# Patient Record
Sex: Male | Born: 1965 | Race: White | Hispanic: No | Smoking: Never smoker
Health system: Southern US, Community
[De-identification: ages and names within clinical notes are randomized; demographics above are authoritative.]

## PROBLEM LIST (undated history)

## (undated) DIAGNOSIS — I2510 Atherosclerotic heart disease of native coronary artery without angina pectoris: Secondary | ICD-10-CM

## (undated) DIAGNOSIS — I739 Peripheral vascular disease, unspecified: Secondary | ICD-10-CM

## (undated) DIAGNOSIS — F32A Depression, unspecified: Secondary | ICD-10-CM

## (undated) DIAGNOSIS — L03116 Cellulitis of left lower limb: Secondary | ICD-10-CM

## (undated) DIAGNOSIS — G4730 Sleep apnea, unspecified: Secondary | ICD-10-CM

## (undated) DIAGNOSIS — I10 Essential (primary) hypertension: Secondary | ICD-10-CM

## (undated) DIAGNOSIS — S060X9A Concussion with loss of consciousness of unspecified duration, initial encounter: Secondary | ICD-10-CM

## (undated) DIAGNOSIS — L02612 Cutaneous abscess of left foot: Secondary | ICD-10-CM

## (undated) DIAGNOSIS — M797 Fibromyalgia: Secondary | ICD-10-CM

## (undated) DIAGNOSIS — T8781 Dehiscence of amputation stump: Secondary | ICD-10-CM

## (undated) DIAGNOSIS — I82409 Acute embolism and thrombosis of unspecified deep veins of unspecified lower extremity: Secondary | ICD-10-CM

## (undated) DIAGNOSIS — K219 Gastro-esophageal reflux disease without esophagitis: Secondary | ICD-10-CM

## (undated) DIAGNOSIS — I639 Cerebral infarction, unspecified: Secondary | ICD-10-CM

## (undated) DIAGNOSIS — F4310 Post-traumatic stress disorder, unspecified: Secondary | ICD-10-CM

## (undated) DIAGNOSIS — G629 Polyneuropathy, unspecified: Secondary | ICD-10-CM

## (undated) DIAGNOSIS — F419 Anxiety disorder, unspecified: Secondary | ICD-10-CM

## (undated) DIAGNOSIS — L0390 Cellulitis, unspecified: Secondary | ICD-10-CM

## (undated) DIAGNOSIS — E785 Hyperlipidemia, unspecified: Secondary | ICD-10-CM

## (undated) DIAGNOSIS — Z973 Presence of spectacles and contact lenses: Secondary | ICD-10-CM

## (undated) DIAGNOSIS — N184 Chronic kidney disease, stage 4 (severe): Secondary | ICD-10-CM

## (undated) DIAGNOSIS — S060XAA Concussion with loss of consciousness status unknown, initial encounter: Secondary | ICD-10-CM

## (undated) DIAGNOSIS — F40240 Claustrophobia: Secondary | ICD-10-CM

## (undated) DIAGNOSIS — F410 Panic disorder [episodic paroxysmal anxiety] without agoraphobia: Secondary | ICD-10-CM

## (undated) DIAGNOSIS — N179 Acute kidney failure, unspecified: Secondary | ICD-10-CM

## (undated) DIAGNOSIS — M1990 Unspecified osteoarthritis, unspecified site: Secondary | ICD-10-CM

## (undated) DIAGNOSIS — F329 Major depressive disorder, single episode, unspecified: Secondary | ICD-10-CM

## (undated) HISTORY — DX: Cutaneous abscess of left foot: L02.612

## (undated) HISTORY — DX: Chronic kidney disease, stage 4 (severe): N18.4

## (undated) HISTORY — PX: EYE SURGERY: SHX253

## (undated) HISTORY — PX: TONSILLECTOMY: SUR1361

## (undated) HISTORY — PX: KNEE ARTHROSCOPY: SUR90

## (undated) HISTORY — DX: Dehiscence of amputation stump: T87.81

## (undated) HISTORY — PX: COLONOSCOPY: SHX174

## (undated) HISTORY — DX: Cellulitis of left lower limb: L03.116

## (undated) HISTORY — PX: RETINAL CRYOABLATION: SHX2337

## (undated) HISTORY — DX: Sleep apnea, unspecified: G47.30

## (undated) HISTORY — DX: Hyperlipidemia, unspecified: E78.5

## (undated) HISTORY — PX: WISDOM TOOTH EXTRACTION: SHX21

---

## 1996-08-22 DIAGNOSIS — I10 Essential (primary) hypertension: Secondary | ICD-10-CM

## 1996-08-22 HISTORY — DX: Essential (primary) hypertension: I10

## 1997-12-19 ENCOUNTER — Other Ambulatory Visit: Admission: RE | Admit: 1997-12-19 | Discharge: 1997-12-19 | Payer: Self-pay | Admitting: Obstetrics and Gynecology

## 2000-11-08 ENCOUNTER — Encounter: Payer: Self-pay | Admitting: Family Medicine

## 2000-11-08 ENCOUNTER — Ambulatory Visit (HOSPITAL_COMMUNITY): Admission: RE | Admit: 2000-11-08 | Discharge: 2000-11-08 | Payer: Self-pay | Admitting: Family Medicine

## 2002-10-02 ENCOUNTER — Encounter: Admission: RE | Admit: 2002-10-02 | Discharge: 2002-12-31 | Payer: Self-pay | Admitting: Family Medicine

## 2007-08-23 HISTORY — PX: CARDIAC CATHETERIZATION: SHX172

## 2011-07-21 DIAGNOSIS — E782 Mixed hyperlipidemia: Secondary | ICD-10-CM | POA: Insufficient documentation

## 2012-01-21 DIAGNOSIS — N179 Acute kidney failure, unspecified: Secondary | ICD-10-CM

## 2012-01-21 HISTORY — DX: Acute kidney failure, unspecified: N17.9

## 2012-02-05 ENCOUNTER — Encounter (HOSPITAL_COMMUNITY): Payer: Self-pay | Admitting: Internal Medicine

## 2012-02-05 ENCOUNTER — Inpatient Hospital Stay (HOSPITAL_COMMUNITY): Payer: Self-pay

## 2012-02-05 ENCOUNTER — Inpatient Hospital Stay (HOSPITAL_COMMUNITY)
Admission: RE | Admit: 2012-02-05 | Discharge: 2012-02-09 | DRG: 683 | Disposition: A | Discharge status: Home / Self Care | Payer: MEDICAID | Source: Other Acute Inpatient Hospital | Attending: Internal Medicine | Admitting: Internal Medicine

## 2012-02-05 DIAGNOSIS — E1165 Type 2 diabetes mellitus with hyperglycemia: Secondary | ICD-10-CM | POA: Diagnosis present

## 2012-02-05 DIAGNOSIS — N179 Acute kidney failure, unspecified: Secondary | ICD-10-CM

## 2012-02-05 DIAGNOSIS — E669 Obesity, unspecified: Secondary | ICD-10-CM | POA: Diagnosis present

## 2012-02-05 DIAGNOSIS — E119 Type 2 diabetes mellitus without complications: Secondary | ICD-10-CM | POA: Diagnosis present

## 2012-02-05 DIAGNOSIS — N189 Chronic kidney disease, unspecified: Secondary | ICD-10-CM | POA: Diagnosis present

## 2012-02-05 DIAGNOSIS — L03119 Cellulitis of unspecified part of limb: Secondary | ICD-10-CM | POA: Diagnosis present

## 2012-02-05 DIAGNOSIS — I2510 Atherosclerotic heart disease of native coronary artery without angina pectoris: Secondary | ICD-10-CM | POA: Diagnosis present

## 2012-02-05 DIAGNOSIS — R0602 Shortness of breath: Secondary | ICD-10-CM

## 2012-02-05 DIAGNOSIS — E1142 Type 2 diabetes mellitus with diabetic polyneuropathy: Secondary | ICD-10-CM | POA: Diagnosis present

## 2012-02-05 DIAGNOSIS — I10 Essential (primary) hypertension: Secondary | ICD-10-CM | POA: Diagnosis present

## 2012-02-05 DIAGNOSIS — L02419 Cutaneous abscess of limb, unspecified: Secondary | ICD-10-CM | POA: Diagnosis present

## 2012-02-05 DIAGNOSIS — R112 Nausea with vomiting, unspecified: Secondary | ICD-10-CM | POA: Diagnosis present

## 2012-02-05 DIAGNOSIS — I129 Hypertensive chronic kidney disease with stage 1 through stage 4 chronic kidney disease, or unspecified chronic kidney disease: Secondary | ICD-10-CM | POA: Diagnosis present

## 2012-02-05 DIAGNOSIS — E875 Hyperkalemia: Secondary | ICD-10-CM | POA: Diagnosis present

## 2012-02-05 DIAGNOSIS — Z6841 Body Mass Index (BMI) 40.0 and over, adult: Secondary | ICD-10-CM

## 2012-02-05 HISTORY — DX: Anxiety disorder, unspecified: F41.9

## 2012-02-05 HISTORY — DX: Nausea with vomiting, unspecified: R11.2

## 2012-02-05 HISTORY — DX: Atherosclerotic heart disease of native coronary artery without angina pectoris: I25.10

## 2012-02-05 HISTORY — DX: Fibromyalgia: M79.7

## 2012-02-05 HISTORY — DX: Essential (primary) hypertension: I10

## 2012-02-05 HISTORY — DX: Type 2 diabetes mellitus with hyperglycemia: E11.65

## 2012-02-05 LAB — COMPREHENSIVE METABOLIC PANEL
ALT: 6 U/L (ref 0–53)
Albumin: 3.8 g/dL (ref 3.5–5.2)
Alkaline Phosphatase: 83 U/L (ref 39–117)
Chloride: 86 mEq/L — ABNORMAL LOW (ref 96–112)
Glucose, Bld: 144 mg/dL — ABNORMAL HIGH (ref 70–99)
Potassium: 4.3 mEq/L (ref 3.5–5.1)
Sodium: 132 mEq/L — ABNORMAL LOW (ref 135–145)
Total Bilirubin: 0.3 mg/dL (ref 0.3–1.2)
Total Protein: 8.3 g/dL (ref 6.0–8.3)

## 2012-02-05 LAB — DIFFERENTIAL
Eosinophils Relative: 1 % (ref 0–5)
Lymphocytes Relative: 12 % (ref 12–46)
Lymphs Abs: 1.6 10*3/uL (ref 0.7–4.0)
Monocytes Relative: 7 % (ref 3–12)

## 2012-02-05 LAB — CBC
Hemoglobin: 11.5 g/dL — ABNORMAL LOW (ref 13.0–17.0)
MCH: 30.2 pg (ref 26.0–34.0)
MCV: 90.8 fL (ref 78.0–100.0)
Platelets: 568 10*3/uL — ABNORMAL HIGH (ref 150–400)
RBC: 3.81 MIL/uL — ABNORMAL LOW (ref 4.22–5.81)
WBC: 13.2 10*3/uL — ABNORMAL HIGH (ref 4.0–10.5)

## 2012-02-05 LAB — PROTIME-INR: Prothrombin Time: 14.3 seconds (ref 11.6–15.2)

## 2012-02-05 LAB — RETICULOCYTES
RBC.: 3.81 MIL/uL — ABNORMAL LOW (ref 4.22–5.81)
Retic Ct Pct: 0.6 % (ref 0.4–3.1)

## 2012-02-05 LAB — CK: Total CK: 34 U/L (ref 7–232)

## 2012-02-05 MED ORDER — ACETAMINOPHEN 650 MG RE SUPP: 650.0000 mg | Freq: Four times a day (QID) | RECTAL | Status: DC | PRN

## 2012-02-05 MED ORDER — ONDANSETRON HCL 4 MG/2ML IJ SOLN
4.0000 mg | Freq: Four times a day (QID) | INTRAMUSCULAR | Status: DC | PRN
  Administered 2012-02-05 – 2012-02-07 (×5): 4 mg via INTRAVENOUS
  Filled 2012-02-05 (×5): qty 2

## 2012-02-05 MED ORDER — INSULIN ASPART 100 UNIT/ML ~~LOC~~ SOLN
0.0000 [IU] | Freq: Three times a day (TID) | SUBCUTANEOUS | Status: DC
  Administered 2012-02-06 – 2012-02-09 (×9): 2 [IU] via SUBCUTANEOUS

## 2012-02-05 MED ORDER — FENTANYL 75 MCG/HR TD PT72
75.0000 ug | MEDICATED_PATCH | TRANSDERMAL | Status: DC
  Administered 2012-02-08: 75 ug via TRANSDERMAL
  Filled 2012-02-05: qty 1

## 2012-02-05 MED ORDER — POLYETHYLENE GLYCOL 3350 17 G PO PACK
17.0000 g | PACK | Freq: Every day | ORAL | Status: DC | PRN
  Filled 2012-02-05: qty 1

## 2012-02-05 MED ORDER — ONDANSETRON HCL 4 MG PO TABS: 4.0000 mg | ORAL_TABLET | Freq: Four times a day (QID) | ORAL | Status: DC | PRN

## 2012-02-05 MED ORDER — SODIUM CHLORIDE 0.9 % IV BOLUS (SEPSIS)
1000.0000 mL | Freq: Once | INTRAVENOUS | Status: AC
  Administered 2012-02-05: 1000 mL via INTRAVENOUS

## 2012-02-05 MED ORDER — PANTOPRAZOLE SODIUM 40 MG PO TBEC
40.0000 mg | DELAYED_RELEASE_TABLET | Freq: Every day | ORAL | Status: DC
  Administered 2012-02-06 – 2012-02-09 (×4): 40 mg via ORAL
  Filled 2012-02-05 (×4): qty 1

## 2012-02-05 MED ORDER — ACETAMINOPHEN 325 MG PO TABS: 650.0000 mg | ORAL_TABLET | Freq: Four times a day (QID) | ORAL | Status: DC | PRN

## 2012-02-05 MED ORDER — MORPHINE SULFATE 2 MG/ML IJ SOLN: 2.0000 mg | INTRAMUSCULAR | Status: DC | PRN

## 2012-02-05 MED ORDER — SODIUM CHLORIDE 0.9 % IV SOLN
INTRAVENOUS | Status: DC
  Administered 2012-02-05 – 2012-02-08 (×6): via INTRAVENOUS

## 2012-02-05 MED ORDER — LORAZEPAM 2 MG/ML IJ SOLN
1.0000 mg | Freq: Four times a day (QID) | INTRAMUSCULAR | Status: DC | PRN
  Administered 2012-02-05 – 2012-02-09 (×5): 1 mg via INTRAVENOUS
  Filled 2012-02-05 (×5): qty 1

## 2012-02-05 MED ORDER — CLOPIDOGREL BISULFATE 75 MG PO TABS
75.0000 mg | ORAL_TABLET | Freq: Every day | ORAL | Status: DC
  Administered 2012-02-06 – 2012-02-09 (×4): 75 mg via ORAL
  Filled 2012-02-05 (×5): qty 1

## 2012-02-05 MED ORDER — SIMVASTATIN 40 MG PO TABS
40.0000 mg | ORAL_TABLET | Freq: Every day | ORAL | Status: DC
  Administered 2012-02-06 – 2012-02-08 (×3): 40 mg via ORAL
  Filled 2012-02-05 (×5): qty 1

## 2012-02-05 MED ORDER — ZOLPIDEM TARTRATE 5 MG PO TABS
5.0000 mg | ORAL_TABLET | Freq: Every evening | ORAL | Status: DC | PRN
  Administered 2012-02-05: 5 mg via ORAL
  Filled 2012-02-05: qty 1

## 2012-02-05 MED ORDER — HEPARIN SODIUM (PORCINE) 5000 UNIT/ML IJ SOLN
5000.0000 [IU] | Freq: Three times a day (TID) | INTRAMUSCULAR | Status: DC
  Administered 2012-02-05 – 2012-02-08 (×9): 5000 [IU] via SUBCUTANEOUS
  Filled 2012-02-05 (×14): qty 1

## 2012-02-05 MED ORDER — METOPROLOL TARTRATE 25 MG PO TABS
25.0000 mg | ORAL_TABLET | Freq: Two times a day (BID) | ORAL | Status: DC
  Filled 2012-02-05: qty 1

## 2012-02-05 MED ORDER — METOPROLOL TARTRATE 25 MG PO TABS
25.0000 mg | ORAL_TABLET | Freq: Two times a day (BID) | ORAL | Status: DC
  Administered 2012-02-05 – 2012-02-09 (×8): 25 mg via ORAL
  Filled 2012-02-05 (×9): qty 1

## 2012-02-05 NOTE — Consult Note (Signed)
Kristopher Simon 02/05/2012 Hawley Michel D Requesting Physician:  Dr. Aileen Fass  Reason for Consult:  Acute renal failure in setting of LLE cellulitis HPI: The patient is a 46 y.o. year-old WM w/ hx of DM2 (35yrs), HTN (15 yrs), obesity, CAD w stent and hx of cellulitis admitted at [facility] 5/30 w 2d hx of fever and 24 hr hx of painful, red L leg below the knee.  SCr was 1.8, w prior baseline SCr of 1.1-1.2 in May 2013.  Patient had taken 1 large NSAID dose for fever 1-2 d PTA, but no chronic NSAID's. He was on lisinopril which was continued from 5/30-6/3.  He was treated with IV Vanc 2 gm q 12 hr to start, and IV zosyn.  Creat improved to 1.10 on 6/1, then worsened daily up to 4.5 on 6/4 and 6.20 on 6/5.  IV Vanc stopped on 6/3 and ACEI stopped on 6/5. Treated with BActrim instead.  Trough Vanc levels were 17, then 29, then 24.  Creat peaked at 8.3 on 6/7 then started to improve down to 7.90 on 6/10.  IV lasix was started on 6/9 and creat rose back up steadily to 9.4 on 6/14 and has remained over 9.  Pt transferred today due to concerns about possible uremia.  Did not receive any IV contrast.    Pt reports nausea, worse than usual (sensitive stomach at baseline per pt).  No hx of kidney disease or difficulty voiding. No SOB, cough , CP or fevers. Leg is much better, redness and pain mostly gone.  Getting OOB.  Not eating much, drinking some.  Not vomiting.  Some jerking when falling asleep.    Past Medical History:  Past Medical History  Diagnosis Date  . Coronary artery disease   . Hypertension 1998  . Diabetes mellitus 1995    Past Surgical History:  Past Surgical History  Procedure Date  . Cardiac catheterization     Family History:  Family History  Problem Relation Age of Onset  . Hypertension Mother   . Dementia Father   . Alzheimer's disease Father    Social History:  reports that he has never smoked. He does not have any smokeless tobacco history on file. He reports that  he drinks about .6 ounces of alcohol per week. He reports that he does not use illicit drugs.  Allergies:  Allergies  Allergen Reactions  . Ibuprofen     Kidney issues    Home medications: Prior to Admission medications   Not on File    Inpatient medications:    . clopidogrel  75 mg Oral Q breakfast  . fentaNYL  75 mcg Transdermal Q72H  . heparin  5,000 Units Subcutaneous Q8H  . insulin aspart  0-9 Units Subcutaneous TID WC  . metoprolol tartrate  25 mg Oral BID  . pantoprazole  40 mg Oral Q1200  . simvastatin  40 mg Oral q1800  . sodium chloride  1,000 mL Intravenous Once    Review of Systems Gen:  Denies headache, fever, chills, sweats.  No weight loss. HEENT:  No visual change, sore throat, difficulty swallowing. Resp:  No difficulty breathing, DOE.  No cough or hemoptysis. Cardiac:  No chest pain, orthopnea, PND.  Denies edema. GI:   Denies abdominal pain.   As above. No constipation. GU:  Denies difficulty or change in voiding.  No change in urine color.     MS:  Denies joint pain or swelling.   Derm:  Denies skin rash or  itching.  No chronic skin conditions.  Neuro:   Denies focal weakness, memory problems, hx stroke or TIA.   Psych:  Denies symptoms of depression of anxiety.  No hallucination.    Labs: Pending  Xrays/Other Studies: X-ray Chest Pa And Lateral   02/05/2012  *RADIOLOGY REPORT*  Clinical Data: Shortness of breath  CHEST - 2 VIEW  Comparison: 01/30/2012  Findings: Right subclavian approach central line tip terminates over the superior SVC.  Heart size is normal.  The lungs are clear. No pleural effusion.  No acute osseous finding.  IMPRESSION: No acute cardiopulmonary process.  Original Report Authenticated By: Arline Asp, M.D.    Physical Exam:  Blood pressure 152/78, pulse 73, temperature 98.2 F (36.8 C), temperature source Oral, resp. rate 20, height 6\' 5"  (1.956 m), weight 164.3 kg (362 lb 3.5 oz), SpO2 100.00%.  Gen: alert, no distress,  calm Skin: no rash, cyanosis Neck: no JVD, no bruits or LAN Chest: clear bilat to bases Heart: regular, no rub or gallop Abdomen: soft, obese, no HSM, no ascites or masses Ext: no LE edema, minimal LLE erythema residual above the ankle Neuro: alert, Ox3, no focal deficit, no asterixis Heme/Lymph: no bruising or LAN  Impression/Plan 1. Acute renal failure - likely ATN due to ACEI, lowish BP's and Vanc toxicity.  He received ACEI for first week of hospital stay and had rather abrupt onset of AKI with rapid loss of renal function.  ACEI and IV Vanc were stopped.  He was getting better then, but worsened again after IV lasix started on 6/9. Patients with AKI need full volume to recover promptly and should only be diuresed for gross intravascular volume excess, i.e. pulm edema.  Needs lots of fluids, he is dry at this time.  Suspect he will recover, no need for acute HD.  He has mild uremic symptoms.  Stopped IV lasix.   2. Hyperkalemia- use low K diet, increase volume  3. LLE cellulitis - better, s/p abx. 4. DM 2 - 18 yrs duration, on insulin 5. CKD - baseline creat 1.1-1.2 6. HTN - do not overtreat, let BP run higher if needed while giving volume  Plan-  IVF's, stop lasix, daily SCr, strict I/O's, low K diet  [name]  MD [facility] [phone] pgr    [phone] cell 02/05/2012, 7:37 PM

## 2012-02-05 NOTE — H&P (Signed)
[facility] History and Physical  Rayburn Depasquale [id number] DOB: 11-30-1965 DOA: 02/05/2012   PCP: William Hamburger, MD   Chief Complaint: Acute renal failure  HPI:  This is a 46 year old male with past medical history of hypertension, diabetes type 2, coronary artery disease with a baseline creatinine of 1.0 back on May 30 at 1-2 more head hospital on May 30 for cellulitis of the lower extremity. Therefore going to noted he took 2 days worth of ibuprofen about 1200 mg. He was started on IV vancomycin and Zosyn was continue on his ACE inhibitor was also put on Bactrim and over the next several days his creatinine climbed to 7.1. [facility] over the phone for his care but his creatinine progressively got worse. She also relates some mild shortness of breath. He says he has been having good urine output. But because his creatinine is not improved he was transferred to [facility] his last creatinine from 1 head was 9.3. So he was transferred to [facility] for further evaluation. He underwent complaining of nausea which has progressively gotten worse.  Review of Systems:  Constitutional:  No weight loss, night sweats, Fevers, chills, fatigue.  HEENT:  No headaches, Difficulty swallowing,Tooth/dental problems,Sore throat,  No sneezing, itching, ear ache, nasal congestion, post nasal drip,  Cardio-vascular:  No chest pain, Orthopnea, PND, swelling in lower extremities, anasarca, dizziness, palpitations  GI:  No heartburn, indigestion, abdominal pain, nausea, vomiting, diarrhea, change in bowel habits, loss of appetite  Resp:  No shortness of breath with exertion or at rest. No excess mucus, no productive cough, No non-productive cough, No coughing up of blood.No change in color of mucus.No wheezing.No chest wall deformity  Skin:  no rash or lesions.  GU:  no dysuria, change in color of urine, no urgency or frequency. No flank pain.  Musculoskeletal:  No  joint pain or swelling. No decreased range of motion. No back pain.  Psych:  No change in mood or affect. No depression or anxiety. No memory loss.    Past Medical History  Diagnosis Date  . Coronary artery disease    Past Surgical History  Procedure Date  . Cardiac catheterization    Social History:  does not have a smoking history on file. He does not have any smokeless tobacco history on file. He reports that he drinks about .6 ounces of alcohol per week. He reports that he does not use illicit drugs.  Allergies  Allergen Reactions  . Ibuprofen     Kidney issues    Family History  Problem Relation Age of Onset  . Hypertension Mother   . Dementia Father   . Alzheimer's disease Father     Prior to Admission medications   Not on File   Physical Exam: Filed Vitals:   02/05/12 1650  BP: 152/78  Pulse: 73  Temp: 98.2 F (36.8 C)  TempSrc: Oral  Resp: 20  Height: 6\' 5"  (1.956 m)  SpO2: 100%   BP 152/78  Pulse 73  Temp 98.2 F (36.8 C) (Oral)  Resp 20  Ht 6\' 5"  (1.956 m)  SpO2 100%  General Appearance:    Alert, cooperative, no distress, appears stated age, obese male   Head:    Normocephalic, without obvious abnormality, atraumatic  Eyes:    PERRL, conjunctiva/corneas clear, EOM's intact, fundi    benign, both eyes       Ears:    Normal TM's and external ear canals, both ears  Nose:  Nares normal, septum midline, mucosa normal, no drainage    or sinus tenderness  Throat:   Lips, mucosa, and tongue normal; teeth and gums normal  Neck:   Supple, symmetrical, trachea midline, no adenopathy;       thyroid:  No enlargement/tenderness/nodules; no carotid   bruit or JVD  Back:     Symmetric, no curvature, ROM normal, no CVA tenderness  Lungs:     Clear to auscultation bilaterally, respirations unlabored  Chest wall:    No tenderness or deformity  Heart:    Regular rate and rhythm, S1 and S2 normal, no murmur, rub   or gallop  Abdomen:     Soft, non-tender, bowel  sounds active all four quadrants,    no masses, no organomegaly        Extremities:   Extremities normal, atraumatic, no cyanosis or edema  Pulses:   2+ and symmetric all extremities  Skin:   Skin color, texture, turgor normal, no rashes or lesions  Lymph nodes:   Cervical, supraclavicular, and axillary nodes normal  Neurologic:   CNII-XII intact. Normal strength, sensation and reflexes      throughout    Labs on Admission:  Basic Metabolic Panel: No results found for this basename: NA:5,K:2,CL:5,CO2:5,GLUCOSE:5,BUN:5,CREATININE:5,CALCIUM:5,MG:5,PHOS:5 in the last 168 hours Liver Function Tests: No results found for this basename: AST:5,ALT:5,ALKPHOS:5,BILITOT:5,PROT:5,ALBUMIN:5 in the last 168 hours No results found for this basename: LIPASE:5,AMYLASE:5 in the last 168 hours No results found for this basename: AMMONIA:5 in the last 168 hours CBC: No results found for this basename: WBC:5,NEUTROABS:5,HGB:5,HCT:5,MCV:5,PLT:5 in the last 168 hours Cardiac Enzymes: No results found for this basename: CKTOTAL:5,CKMB:5,CKMBINDEX:5,TROPONINI:5 in the last 168 hours BNP: No components found with this basename: POCBNP:5 CBG: No results found for this basename: GLUCAP:5 in the last 168 hours  Radiological Exams on Admission: No results found.  EKG: Independently reviewed. Pending  Assessment/Plan: Active Problems:  Acute kidney injury: -Most likely multifactorial to exposure to vancomycin, ACE inhibitor possibly with worsening creatinine and Bactrim. We'll check an HIV test, we'll get a renal ultrasound, will do strict I.'s and O's. We'll not start no diuretics. We'll consult renal. We'll check a basic metabolic panel, anemia panel, phosphorus level. We'll then check a TSH and a B12 level.  I will continue conservative management with IV fluids.  -Further workup per renal  We'll go and consult renal for further management.  Shortness of breath: He is sitting in bed comfortably  satting greater than 90% on room air with no difficulty breathing. He has no edema by physical exam. He relates his shortness of breath is mildly worse than it was comparing it to a month ago but not significantly. We'll get an EKG and a chest x-ray. At this time doesn't have crackles in his lungs, no edema and appreciated JVD.  Diabetes mellitus We'll use sliding-scale insulin. Check hemoglobin A1c to  HTN (hypertension): Currently well controlled we'll continue him on his metoprolol. And titrate medication as we continue to monitor his blood pressure.  Nausea & vomiting Nausea is now improved. This probably uremia. Going back to the records from her head. His BUN got up to over 100 which can be contributing to his nausea.  Time spend: Greater than 35 minutes Code Status: Full code Family Communication: Patient and family Disposition Plan: To be determined  Charlynne Cousins, MD  [facility] Pager [phone]  If 7PM-7AM, please contact night-coverage [email or web address] Password [email or web address] 02/05/2012, 5:40 PM

## 2012-02-06 ENCOUNTER — Encounter (HOSPITAL_COMMUNITY): Payer: Self-pay | Admitting: *Deleted

## 2012-02-06 ENCOUNTER — Inpatient Hospital Stay (HOSPITAL_COMMUNITY): Payer: Self-pay

## 2012-02-06 DIAGNOSIS — I10 Essential (primary) hypertension: Secondary | ICD-10-CM

## 2012-02-06 DIAGNOSIS — R0602 Shortness of breath: Secondary | ICD-10-CM

## 2012-02-06 DIAGNOSIS — N179 Acute kidney failure, unspecified: Secondary | ICD-10-CM

## 2012-02-06 DIAGNOSIS — E1165 Type 2 diabetes mellitus with hyperglycemia: Secondary | ICD-10-CM

## 2012-02-06 LAB — COMPREHENSIVE METABOLIC PANEL
Alkaline Phosphatase: 77 U/L (ref 39–117)
BUN: 95 mg/dL — ABNORMAL HIGH (ref 6–23)
Chloride: 90 mEq/L — ABNORMAL LOW (ref 96–112)
GFR calc Af Amer: 8 mL/min — ABNORMAL LOW (ref >90)
GFR calc non Af Amer: 7 mL/min — ABNORMAL LOW (ref >90)
Glucose, Bld: 113 mg/dL — ABNORMAL HIGH (ref 70–99)
Potassium: 4.3 mEq/L (ref 3.5–5.1)
Total Bilirubin: 0.3 mg/dL (ref 0.3–1.2)

## 2012-02-06 LAB — CBC
MCV: 91.3 fL (ref 78.0–100.0)
Platelets: 458 10*3/uL — ABNORMAL HIGH (ref 150–400)
RDW: 12.8 % (ref 11.5–15.5)
WBC: 9.7 10*3/uL (ref 4.0–10.5)

## 2012-02-06 LAB — TSH: TSH: 2.448 u[IU]/mL (ref 0.350–4.500)

## 2012-02-06 LAB — GLUCOSE, CAPILLARY
Glucose-Capillary: 116 mg/dL — ABNORMAL HIGH (ref 70–99)
Glucose-Capillary: 167 mg/dL — ABNORMAL HIGH (ref 70–99)
Glucose-Capillary: 161 mg/dL — ABNORMAL HIGH (ref 70–99)
Glucose-Capillary: 142 mg/dL — ABNORMAL HIGH (ref 70–99)

## 2012-02-06 LAB — IRON AND TIBC: TIBC: 295 ug/dL (ref 215–435)

## 2012-02-06 LAB — HIV ANTIBODY (ROUTINE TESTING W REFLEX): HIV: NONREACTIVE

## 2012-02-06 LAB — SODIUM, URINE, RANDOM: Sodium, Ur: 34 mEq/L

## 2012-02-06 LAB — HEMOGLOBIN A1C: Hgb A1c MFr Bld: 7.8 % — ABNORMAL HIGH (ref <5.7)

## 2012-02-06 LAB — FERRITIN: Ferritin: 340 ng/mL — ABNORMAL HIGH (ref 22–322)

## 2012-02-06 NOTE — Progress Notes (Signed)
Subjective: Still with some nausea. No abdominal pain. No diarrhea.  Want to rest and sleep.   Objective: Filed Vitals:   02/05/12 1650 02/05/12 2133 02/06/12 0530 02/06/12 0931  BP: 152/78 126/73 137/69 151/76  Pulse: 73 77 71 67  Temp: 98.2 F (36.8 C) 97.6 F (36.4 C) 97.8 F (36.6 C) 97.5 F (36.4 C)  TempSrc: Oral Oral Oral Oral  Resp: 20 20 20 20   Height: 6\' 5"  (1.956 m)     Weight: 164.3 kg (362 lb 3.5 oz)     SpO2: 100% 100% 99% 100%   Weight change:    General: Alert, awake, oriented x3, in no acute distress.  HEENT: No bruits, no goiter.  Heart: Regular rate and rhythm, without murmurs, rubs, gallops.  Lungs: CTA, bilateral air movement.  Abdomen: Soft, nontender, nondistended, positive bowel sounds.  Neuro: Grossly intact, nonfocal. Extremities; no edema.   Lab Results:  Basename 02/06/12 0600 02/05/12 2040  NA 135 132*  K 4.3 4.3  CL 90* 86*  CO2 29 28  GLUCOSE 113* 144*  BUN 95* 96*  CREATININE 8.15* 8.72*  CALCIUM 9.6 10.0  MG -- 2.2  PHOS -- 8.1*    Basename 02/06/12 0600 02/05/12 2040  AST 8 8  ALT 6 6  ALKPHOS 77 83  BILITOT 0.3 0.3  PROT 7.8 8.3  ALBUMIN 3.5 3.8   Basename 02/06/12 0600 02/05/12 2040  WBC 9.7 13.2*  NEUTROABS -- 10.5*  HGB 10.9* 11.5*  HCT 32.7* 34.6*  MCV 91.3 90.8  PLT 458* 568*    Basename 02/05/12 2040  CKTOTAL 34  CKMB --  CKMBINDEX --  TROPONINI --    Basename 02/05/12 2040  HGBA1C 7.8*   Basename 02/05/12 2040  TSH 2.448  T4TOTAL --  T3FREE --  THYROIDAB --    Basename 02/05/12 2040  VITAMINB12 284  FOLATE 8.0  FERRITIN 340*  TIBC 295  IRON 80  RETICCTPCT 0.6    Micro Results: Recent Results (from the past 240 hour(s))  MRSA PCR SCREENING     Status: Normal   Collection Time   02/05/12  8:57 PM      Component Value Range Status Comment   MRSA by PCR NEGATIVE  NEGATIVE Final     Studies/Results: X-ray Chest Pa And Lateral   02/05/2012  *RADIOLOGY REPORT*  Clinical Data:  Shortness of breath  CHEST - 2 VIEW  Comparison: 01/30/2012  Findings: Right subclavian approach central line tip terminates over the superior SVC.  Heart size is normal.  The lungs are clear. No pleural effusion.  No acute osseous finding.  IMPRESSION: No acute cardiopulmonary process.  Original Report Authenticated By: Arline Asp, M.D.   US Renal  02/06/2012  *RADIOLOGY REPORT*  Clinical Data: Acute renal failure.  RENAL/URINARY TRACT ULTRASOUND COMPLETE  Comparison:  Renal ultrasound 01/25/2012.  Findings:  Right Kidney:  Measures 13.0 cm and appears normal without stone, mass or hydronephrosis. Fatty infiltration of the liver noted.  Left Kidney:  Measures 14.6 cm and appears normal without stone, mass or hydronephrosis.  Bladder:  Unremarkable.  IMPRESSION: Negative for hydronephrosis or acute finding.  Fatty infiltration of the liver noted.  Both  Original Report Authenticated By: Arvid Right. Luther Parody, M.D.    Medications: I have reviewed the patient's current medications.  1- Acute kidney injury:  -Most likely multifactorial to exposure to vancomycin, ACE inhibitor possibly with worsening creatinine and Bactrim.  HIV test pending,  renal ultrasound negative for obstruction. Continue with  IV fluids. Appreciate renal help.   2-Shortness of breath:  Denies dyspnea. EKG sinus rhythm and  chest x-ray: No acute cardiopulmonary process.  3-Diabetes mellitus  We'll use sliding-scale insulin.  hemoglobin A1c 7.8.  4-HTN (hypertension):  Continue with metoprolol.   5-Nausea & vomiting  Nausea is now improved. This probably uremia. Continue with protonix.  6-Left LE Cellulitis: resolved.      LOS: 1 day   Kojo Liby M.D.  [facility] 02/06/2012, 12:53 PM

## 2012-02-06 NOTE — Evaluation (Signed)
Physical Therapy Evaluation Patient Details Name: Kristopher Simon MRN: [id number] DOB: [birth year] Today's Date: 02/06/2012 Time: [id number] PT Time Calculation (min): 10 min  PT Assessment / Plan / Recommendation Clinical Impression  Pt adm with acute kidney injury.  Pt I with all mobility and no PT needed.    PT Assessment  Patent does not need any further PT services    Follow Up Recommendations  No PT follow up    Barriers to Discharge        lEquipment Recommendations  None recommended by PT    Recommendations for Other Services     Frequency      Precautions / Restrictions     Pertinent Vitals/Pain N/A      Mobility  Bed Mobility Bed Mobility: Supine to Sit;Sitting - Scoot to Edge of Bed Supine to Sit: 7: Independent Sitting - Scoot to [employer] of Bed: 7: Independent Transfers Transfers: Sit to Stand;Stand to Sit Sit to Stand: 7: Independent Stand to Sit: 7: Independent Ambulation/Gait Ambulation/Gait Assistance: 7: Independent Ambulation Distance (Feet): 300 Feet Assistive device: None Gait Pattern: Within Functional Limits    Exercises     PT Diagnosis:    PT Problem List:   PT Treatment Interventions:     PT Goals    Visit Information  Last PT Received On: 02/06/12    Subjective Data  Subjective: Pt states he doesn't like to wear a hospital gown. Patient Stated Goal: Return home   Prior [place] Lives With: Alone Type of Home: House Home Access: Stairs to enter Home Layout: One level [place]: None Prior Function Level of Independence: Independent Able to Take Stairs?: Yes Driving: Yes Vocation: Self employed Communication Communication: No difficulties    Cognition  Overall Cognitive Status: Appears within functional limits for tasks assessed/performed Arousal/Alertness: Awake/alert Orientation Level: Appears intact for tasks assessed Behavior During Session: [facility] for tasks performed    Extremity/Trunk  Assessment Right Lower Extremity Assessment RLE ROM/Strength/Tone: Within functional levels Left Lower Extremity Assessment LLE ROM/Strength/Tone: Within functional levels   Balance    End of Session PT - End of Session Activity Tolerance: Patient tolerated treatment well Patient left:  (standing in room)   [place] 02/06/2012, 2:15 PM  [facility] PT [phone]

## 2012-02-06 NOTE — Progress Notes (Signed)
Subjective:  Good UOP, numbers down but just a little bit.  Alert, joking, not uremic Objective Vital signs in last 24 hours: Filed Vitals:   02/05/12 1650 02/05/12 2133 02/06/12 0530 02/06/12 0931  BP: 152/78 126/73 137/69 151/76  Pulse: 73 77 71 67  Temp: 98.2 F (36.8 C) 97.6 F (36.4 C) 97.8 F (36.6 C) 97.5 F (36.4 C)  TempSrc: Oral Oral Oral Oral  Resp: 20 20 20 20   Height: 6\' 5"  (1.956 m)     Weight: 164.3 kg (362 lb 3.5 oz)     SpO2: 100% 100% 99% 100%   Weight change:   Intake/Output Summary (Last 24 hours) at 02/06/12 1135 Last data filed at 02/06/12 0936  Gross per 24 hour  Intake 1753.27 ml  Output   1700 ml  Net  53.27 ml   Labs: Basic Metabolic Panel:  Lab 99991111 0600 02/05/12 2040  NA 135 132*  K 4.3 4.3  CL 90* 86*  CO2 29 28  GLUCOSE 113* 144*  BUN 95* 96*  CREATININE 8.15* 8.72*  CALCIUM 9.6 10.0  ALB -- --  PHOS -- 8.1*   Liver Function Tests:  Lab 02/06/12 0600 02/05/12 2040  AST 8 8  ALT 6 6  ALKPHOS 77 83  BILITOT 0.3 0.3  PROT 7.8 8.3  ALBUMIN 3.5 3.8   No results found for this basename: LIPASE:3,AMYLASE:3 in the last 168 hours No results found for this basename: AMMONIA:3 in the last 168 hours CBC:  Lab 02/06/12 0600 02/05/12 2040  WBC 9.7 13.2*  NEUTROABS -- 10.5*  HGB 10.9* 11.5*  HCT 32.7* 34.6*  MCV 91.3 90.8  PLT 458* 568*   Cardiac Enzymes:  Lab 02/05/12 2040  CKTOTAL 34  CKMB --  CKMBINDEX --  TROPONINI --   CBG:  Lab 02/06/12 0744 02/05/12 2008 02/05/12 1713  GLUCAP 116* 151* 132*    Iron Studies:  Basename 02/05/12 2040  IRON --  TIBC --  TRANSFERRIN --  FERRITIN 340*   Studies/Results: X-ray Chest Pa And Lateral   02/05/2012  *RADIOLOGY REPORT*  Clinical Data: Shortness of breath  CHEST - 2 VIEW  Comparison: 01/30/2012  Findings: Right subclavian approach central line tip terminates over the superior SVC.  Heart size is normal.  The lungs are clear. No pleural effusion.  No acute osseous  finding.  IMPRESSION: No acute cardiopulmonary process.  Original Report Authenticated By: Arline Asp, M.D.   US Renal  02/06/2012  *RADIOLOGY REPORT*  Clinical Data: Acute renal failure.  RENAL/URINARY TRACT ULTRASOUND COMPLETE  Comparison:  Renal ultrasound 01/25/2012.  Findings:  Right Kidney:  Measures 13.0 cm and appears normal without stone, mass or hydronephrosis. Fatty infiltration of the liver noted.  Left Kidney:  Measures 14.6 cm and appears normal without stone, mass or hydronephrosis.  Bladder:  Unremarkable.  IMPRESSION: Negative for hydronephrosis or acute finding.  Fatty infiltration of the liver noted.  Both  Original Report Authenticated By: Arvid Right. Luther Parody, M.D.   Medications: Infusions:    . sodium chloride 100 mL/hr at 02/06/12 1026    Scheduled Medications:    . clopidogrel  75 mg Oral Q breakfast  . fentaNYL  75 mcg Transdermal Q72H  . heparin  5,000 Units Subcutaneous Q8H  . insulin aspart  0-9 Units Subcutaneous TID WC  . metoprolol tartrate  25 mg Oral BID  . pantoprazole  40 mg Oral Q1200  . simvastatin  40 mg Oral q1800  . sodium chloride  1,000 mL Intravenous Once  . DISCONTD: metoprolol tartrate  25 mg Oral BID    have reviewed scheduled and prn medications.  Physical Exam: General: alert, joking Heart: RRR Lungs: clear bilaterally Abdomen: obese, soft Extremities: no edema   I Assessment/ Plan: Pt is a 46 y.o. yo male who was admitted on 02/05/2012 with  Cellulitis and acute on chronic renal failure.  Assessment/Plan: 1. Acute renal failure - likely ATN due to ACEI, lowish BP's and Vanc toxicity, as well as historical NSAIDS and bactrim. Appeared to be dry at the time of admit, has been getting IVF. Mild improvement and good UOP, no indications for HD.  continue with IVF for now. Check labs in AM 2. Hyperkalemia- use low K diet, increase volume...better 3. LLE cellulitis - better, s/p abx. 4. DM 2 - 18 yrs duration, on insulin 5. CKD  - baseline creat 1.1-1.2 6. HTN - do not overtreat, let BP run higher if needed while giving volume 7. Dispo- Creatinine does not need to be at baseline, just trending toward to be able to discharge     Fabiana Dromgoole A   02/06/2012,11:35 AM  LOS: 1 day

## 2012-02-06 NOTE — Progress Notes (Signed)
Utilization review completed. Lizabeth Leyden RN, CCM

## 2012-02-07 DIAGNOSIS — N179 Acute kidney failure, unspecified: Secondary | ICD-10-CM

## 2012-02-07 DIAGNOSIS — E1165 Type 2 diabetes mellitus with hyperglycemia: Secondary | ICD-10-CM

## 2012-02-07 DIAGNOSIS — I10 Essential (primary) hypertension: Secondary | ICD-10-CM

## 2012-02-07 DIAGNOSIS — R0602 Shortness of breath: Secondary | ICD-10-CM

## 2012-02-07 LAB — CBC
Hemoglobin: 10.3 g/dL — ABNORMAL LOW (ref 13.0–17.0)
MCH: 29.8 pg (ref 26.0–34.0)
Platelets: 484 10*3/uL — ABNORMAL HIGH (ref 150–400)
RBC: 3.46 MIL/uL — ABNORMAL LOW (ref 4.22–5.81)

## 2012-02-07 LAB — RENAL FUNCTION PANEL
Albumin: 3.4 g/dL — ABNORMAL LOW (ref 3.5–5.2)
CO2: 27 mEq/L (ref 19–32)
Chloride: 98 mEq/L (ref 96–112)
GFR calc Af Amer: 11 mL/min — ABNORMAL LOW (ref >90)
GFR calc non Af Amer: 10 mL/min — ABNORMAL LOW (ref >90)
Potassium: 4.1 mEq/L (ref 3.5–5.1)
Sodium: 140 mEq/L (ref 135–145)

## 2012-02-07 LAB — GLUCOSE, CAPILLARY
Glucose-Capillary: 173 mg/dL — ABNORMAL HIGH (ref 70–99)
Glucose-Capillary: 187 mg/dL — ABNORMAL HIGH (ref 70–99)

## 2012-02-07 MED ORDER — SODIUM CHLORIDE 0.9 % IJ SOLN
10.0000 mL | INTRAMUSCULAR | Status: DC | PRN
  Administered 2012-02-07 – 2012-02-08 (×2): 10 mL
  Administered 2012-02-09: 30 mL

## 2012-02-07 MED ORDER — METOCLOPRAMIDE HCL 5 MG PO TABS
5.0000 mg | ORAL_TABLET | Freq: Three times a day (TID) | ORAL | Status: DC
  Administered 2012-02-07 – 2012-02-09 (×7): 5 mg via ORAL
  Filled 2012-02-07 (×12): qty 1

## 2012-02-07 NOTE — Progress Notes (Signed)
Subjective: Patient relates vomiting last night, fluid content , no blood. Denies abdominal pain.   Objective: Filed Vitals:   02/06/12 1900 02/06/12 2043 02/07/12 0531 02/07/12 1000  BP:  167/74 154/92 136/70  Pulse:  69 75 77  Temp:  97.8 F (36.6 C) 98.1 F (36.7 C) 98.3 F (36.8 C)  TempSrc:  Oral Oral Oral  Resp:  19 20 20   Height:      Weight: 162.4 kg (358 lb 0.4 oz)     SpO2:  100% 96% 98%   Weight change: -1.9 kg (-4 lb 3 oz)   General: Alert, awake, oriented x3, in no acute distress.  HEENT: No bruits, no goiter.  Heart: Regular rate and rhythm, without murmurs, rubs, gallops.  Lungs: CTA, bilateral air movement.  Abdomen: Soft, nontender, nondistended, positive bowel sounds.  Neuro: Grossly intact, nonfocal. Extremities; no edema.  Lab Results:  Basename 02/07/12 0610 02/06/12 0600 02/05/12 2040  NA 140 135 --  K 4.1 4.3 --  CL 98 90* --  CO2 27 29 --  GLUCOSE 169* 113* --  BUN 79* 95* --  CREATININE 6.31* 8.15* --  CALCIUM 9.6 9.6 --  MG -- -- 2.2  PHOS 5.7* -- 8.1*    Basename 02/07/12 0610 02/06/12 0600 02/05/12 2040  AST -- 8 8  ALT -- 6 6  ALKPHOS -- 77 83  BILITOT -- 0.3 0.3  PROT -- 7.8 8.3  ALBUMIN 3.4* 3.5 --    Basename 02/07/12 0610 02/06/12 0600 02/05/12 2040  WBC 8.1 9.7 --  NEUTROABS -- -- 10.5*  HGB 10.3* 10.9* --  HCT 31.9* 32.7* --  MCV 92.2 91.3 --  PLT 484* 458* --    Basename 02/05/12 2040  CKTOTAL 34  CKMB --  CKMBINDEX --  TROPONINI --    Basename 02/05/12 2040  HGBA1C 7.8*    Basename 02/05/12 2040  TSH 2.448  T4TOTAL --  T3FREE --  THYROIDAB --    Basename 02/05/12 2040  VITAMINB12 284  FOLATE 8.0  FERRITIN 340*  TIBC 295  IRON 80  RETICCTPCT 0.6    Micro Results: Recent Results (from the past 240 hour(s))  MRSA PCR SCREENING     Status: Normal   Collection Time   02/05/12  8:57 PM      Component Value Range Status Comment   MRSA by PCR NEGATIVE  NEGATIVE Final      Studies/Results: X-ray Chest Pa And Lateral   02/05/2012  *RADIOLOGY REPORT*  Clinical Data: Shortness of breath  CHEST - 2 VIEW  Comparison: 01/30/2012  Findings: Right subclavian approach central line tip terminates over the superior SVC.  Heart size is normal.  The lungs are clear. No pleural effusion.  No acute osseous finding.  IMPRESSION: No acute cardiopulmonary process.  Original Report Authenticated By: Arline Asp, M.D.   US Renal  02/06/2012  *RADIOLOGY REPORT*  Clinical Data: Acute renal failure.  RENAL/URINARY TRACT ULTRASOUND COMPLETE  Comparison:  Renal ultrasound 01/25/2012.  Findings:  Right Kidney:  Measures 13.0 cm and appears normal without stone, mass or hydronephrosis. Fatty infiltration of the liver noted.  Left Kidney:  Measures 14.6 cm and appears normal without stone, mass or hydronephrosis.  Bladder:  Unremarkable.  IMPRESSION: Negative for hydronephrosis or acute finding.  Fatty infiltration of the liver noted.  Both  Original Report Authenticated By: Arvid Right. Luther Parody, M.D.    Medications: I have reviewed the patient's current medications.  46 year old transfer from [place]  for management of acute renal failure. Patient was hospitalized for Lower extremities cellulitis. His renal failure is thought to be multifactorial, hemodynamic and medications (Vancomycin, ACE).   1- Acute kidney injury:  -Most likely multifactorial to exposure to vancomycin, ACE inhibitor possibly with worsening creatinine and Bactrim. HIV non reactive, renal ultrasound negative for obstruction. Continue with IV fluids. Appreciate renal help. Creatinine trending down. Good urine out put.   2-Shortness of breath:  Denies dyspnea. EKG sinus rhythm and chest x-ray: No acute cardiopulmonary process.   3-Diabetes mellitus  We'll use sliding-scale insulin. hemoglobin A1c 7.8.   4-HTN (hypertension):  Continue with metoprolol. Follow renal recommendation regarding BP control.    5-Nausea & vomiting  Nausea is now improved. This probably uremia. Continue with protonix. If no improvement will need further work up. LFT normal. Will start Reglan.  6-Left LE Cellulitis: resolved. Finished treatment at [place].      LOS: 2 days   Rock Sobol M.D.  [facility] 02/07/2012, 12:34 PM

## 2012-02-07 NOTE — Progress Notes (Signed)
Subjective:  Great UOP, numbers down much more.   Alert, joking, not uremic, hoping for home soon Objective Vital signs in last 24 hours: Filed Vitals:   02/06/12 1900 02/06/12 2043 02/07/12 0531 02/07/12 1000  BP:  167/74 154/92 136/70  Pulse:  69 75 77  Temp:  97.8 F (36.6 C) 98.1 F (36.7 C) 98.3 F (36.8 C)  TempSrc:  Oral Oral Oral  Resp:  19 20 20   Height:      Weight: 162.4 kg (358 lb 0.4 oz)     SpO2:  100% 96% 98%   Weight change: -1.9 kg (-4 lb 3 oz)  Intake/Output Summary (Last 24 hours) at 02/07/12 1059 Last data filed at 02/07/12 0900  Gross per 24 hour  Intake   4008 ml  Output   4350 ml  Net   -342 ml   Labs: Basic Metabolic Panel:  Lab [id number] 02/06/12 0600 02/05/12 2040  NA 140 135 132*  K 4.1 4.3 4.3  CL 98 90* 86*  CO2 27 29 28   GLUCOSE 169* 113* 144*  BUN 79* 95* 96*  CREATININE 6.31* 8.15* 8.72*  CALCIUM 9.6 9.6 10.0  ALB -- -- --  PHOS 5.7* -- 8.1*   Liver Function Tests:  Lab 02/07/12 0610 02/06/12 0600 02/05/12 2040  AST -- 8 8  ALT -- 6 6  ALKPHOS -- 77 83  BILITOT -- 0.3 0.3  PROT -- 7.8 8.3  ALBUMIN 3.4* 3.5 3.8   No results found for this basename: LIPASE:3,AMYLASE:3 in the last 168 hours No results found for this basename: AMMONIA:3 in the last 168 hours CBC:  Lab 02/07/12 0610 02/06/12 0600 02/05/12 2040  WBC 8.1 9.7 13.2*  NEUTROABS -- -- 10.5*  HGB 10.3* 10.9* 11.5*  HCT 31.9* 32.7* 34.6*  MCV 92.2 91.3 90.8  PLT 484* 458* 568*   Cardiac Enzymes:  Lab 02/05/12 2040  CKTOTAL 34  CKMB --  CKMBINDEX --  TROPONINI --   CBG:  Lab 02/07/12 0736 02/06/12 2041 02/06/12 1656 02/06/12 1130 02/06/12 0744  GLUCAP 163* 142* 161* 167* 116*    Iron Studies:   Basename 02/05/12 2040  IRON 80  TIBC 295  TRANSFERRIN --  FERRITIN 340*   Studies/Results: X-ray Chest Pa And Lateral   02/05/2012  *RADIOLOGY REPORT*  Clinical Data: Shortness of breath  CHEST - 2 VIEW  Comparison: 01/30/2012  Findings: Right  subclavian approach central line tip terminates over the superior SVC.  Heart size is normal.  The lungs are clear. No pleural effusion.  No acute osseous finding.  IMPRESSION: No acute cardiopulmonary process.  Original Report Authenticated By: Arline Asp, M.D.   US Renal  02/06/2012  *RADIOLOGY REPORT*  Clinical Data: Acute renal failure.  RENAL/URINARY TRACT ULTRASOUND COMPLETE  Comparison:  Renal ultrasound 01/25/2012.  Findings:  Right Kidney:  Measures 13.0 cm and appears normal without stone, mass or hydronephrosis. Fatty infiltration of the liver noted.  Left Kidney:  Measures 14.6 cm and appears normal without stone, mass or hydronephrosis.  Bladder:  Unremarkable.  IMPRESSION: Negative for hydronephrosis or acute finding.  Fatty infiltration of the liver noted.  Both  Original Report Authenticated By: Arvid Right. Luther Parody, M.D.   Medications: Infusions:    . sodium chloride 100 mL/hr at 02/06/12 1959    Scheduled Medications:    . clopidogrel  75 mg Oral Q breakfast  . fentaNYL  75 mcg Transdermal Q72H  . heparin  5,000 Units Subcutaneous Q8H  .  insulin aspart  0-9 Units Subcutaneous TID WC  . metoprolol tartrate  25 mg Oral BID  . pantoprazole  40 mg Oral Q1200  . simvastatin  40 mg Oral q1800    have reviewed scheduled and prn medications.  Physical Exam: General: alert, joking Heart: RRR Lungs: clear bilaterally Abdomen: obese, soft Extremities: no edema   I Assessment/ Plan: Pt is a 46 y.o. yo male who was admitted on 02/05/2012 with  Cellulitis and acute on chronic renal failure.  Assessment/Plan: 1. Acute renal failure - likely ATN due to ACEI, lowish BP's and Vanc toxicity, as well as historical NSAIDS and bactrim. Appeared to be dry at the time of admit, has been getting IVF. Great UOP and improvement in BUN and creatinine. He probably is having a post injury autodiuresis and needs the IVF to keep up right now and not get too dry.  He is taking in POs as  well.  Will need to make sure can keep up with output on own prior to being discharged.  2. Hyperkalemia- resolved 3. LLE cellulitis - better, s/p abx. 4. DM 2 - 18 yrs duration, on insulin 5. CKD - baseline creat 1.1-1.2 6. HTN - do not overtreat, let BP run higher if needed while giving volume 7. Dispo- Creatinine does not need to be at baseline, just trending toward to be able to discharge, but needs to keep up with urinary losses as above. Continue fairly high dose IVF for now.       Jkwon Treptow A   02/07/2012,10:59 AM  LOS: 2 days

## 2012-02-08 DIAGNOSIS — R112 Nausea with vomiting, unspecified: Secondary | ICD-10-CM

## 2012-02-08 DIAGNOSIS — I10 Essential (primary) hypertension: Secondary | ICD-10-CM

## 2012-02-08 DIAGNOSIS — E1165 Type 2 diabetes mellitus with hyperglycemia: Secondary | ICD-10-CM

## 2012-02-08 LAB — RENAL FUNCTION PANEL
Albumin: 3.4 g/dL — ABNORMAL LOW (ref 3.5–5.2)
BUN: 56 mg/dL — ABNORMAL HIGH (ref 6–23)
CO2: 25 mEq/L (ref 19–32)
Calcium: 9.7 mg/dL (ref 8.4–10.5)
Chloride: 103 mEq/L (ref 96–112)
Creatinine, Ser: 4.51 mg/dL — ABNORMAL HIGH (ref 0.50–1.35)
GFR calc non Af Amer: 14 mL/min — ABNORMAL LOW (ref >90)

## 2012-02-08 LAB — GLUCOSE, CAPILLARY
Glucose-Capillary: 164 mg/dL — ABNORMAL HIGH (ref 70–99)
Glucose-Capillary: 167 mg/dL — ABNORMAL HIGH (ref 70–99)
Glucose-Capillary: 184 mg/dL — ABNORMAL HIGH (ref 70–99)

## 2012-02-08 NOTE — Progress Notes (Signed)
Subjective:  Great UOP, numbers down much more.   Alert, joking, not uremic, hoping for home soon Objective Vital signs in last 24 hours: Filed Vitals:   02/07/12 1738 02/07/12 2217 02/08/12 0520 02/08/12 0915  BP: 156/72 146/71 161/87 176/88  Pulse: 57 69 75 70  Temp: 98.2 F (36.8 C) 97.9 F (36.6 C) 97.7 F (36.5 C) 97.8 F (36.6 C)  TempSrc: Oral Oral Oral Oral  Resp: 20 19 18 18   Height:      Weight:  162.433 kg (358 lb 1.6 oz)    SpO2: 98% 99% 100% 98%   Weight change: 0.033 kg (1.2 oz)  Intake/Output Summary (Last 24 hours) at 02/08/12 1101 Last data filed at 02/08/12 0625  Gross per 24 hour  Intake 2921.67 ml  Output   3650 ml  Net -728.33 ml   Labs: Basic Metabolic Panel:  Lab 0000000 0500 02/07/12 0610 02/06/12 0600 02/05/12 2040  NA 139 140 135 --  K 4.4 4.1 4.3 --  CL 103 98 90* --  CO2 25 27 29  --  GLUCOSE 157* 169* 113* --  BUN 56* 79* 95* --  CREATININE 4.51* 6.31* 8.15* --  CALCIUM 9.7 9.6 9.6 --  ALB -- -- -- --  PHOS 4.6 5.7* -- 8.1*   Liver Function Tests:  Lab 02/08/12 0500 02/07/12 0610 02/06/12 0600 02/05/12 2040  AST -- -- 8 8  ALT -- -- 6 6  ALKPHOS -- -- 77 83  BILITOT -- -- 0.3 0.3  PROT -- -- 7.8 8.3  ALBUMIN 3.4* 3.4* 3.5 --   No results found for this basename: LIPASE:3,AMYLASE:3 in the last 168 hours No results found for this basename: AMMONIA:3 in the last 168 hours CBC:  Lab 02/07/12 0610 02/06/12 0600 02/05/12 2040  WBC 8.1 9.7 13.2*  NEUTROABS -- -- 10.5*  HGB 10.3* 10.9* 11.5*  HCT 31.9* 32.7* 34.6*  MCV 92.2 91.3 90.8  PLT 484* 458* 568*   Cardiac Enzymes:  Lab 02/05/12 2040  CKTOTAL 34  CKMB --  CKMBINDEX --  TROPONINI --   CBG:  Lab 02/08/12 0806 02/07/12 2211 02/07/12 1644 02/07/12 1129 02/07/12 0736  GLUCAP 164* 200* 187* 173* 163*    Iron Studies:   Basename 02/05/12 2040  IRON 80  TIBC 295  TRANSFERRIN --  FERRITIN 340*   Studies/Results: No results found. Medications: Infusions:     . sodium chloride 100 mL/hr at 02/08/12 0156    Scheduled Medications:    . clopidogrel  75 mg Oral Q breakfast  . fentaNYL  75 mcg Transdermal Q72H  . heparin  5,000 Units Subcutaneous Q8H  . insulin aspart  0-9 Units Subcutaneous TID WC  . metoCLOPramide  5 mg Oral TID AC & HS  . metoprolol tartrate  25 mg Oral BID  . pantoprazole  40 mg Oral Q1200  . simvastatin  40 mg Oral q1800    have reviewed scheduled and prn medications.  Physical Exam: General: alert, no distress Heart: RRR Lungs: clear bilaterally Abdomen: obese, soft Extremities: no edema   I Assessment/ Plan: Pt is Simon 46 y.o. yo male who was admitted on 02/05/2012 with  Cellulitis and acute on chronic renal failure.  Assessment/Plan: 1. Acute renal failure - likely ATN due to ACEI, lowish BP's and Vanc toxicity, as well as historical NSAIDS and bactrim. Appeared to be dry at the time of admit, has been getting IVF. Great UOP and improvement in BUN and creatinine. He probably is having  Simon post injury diuresis.  He is taking in POs as well.  I am going to stop IVFs today and advised patient to keep up his fluid intake.  If creatinine is  improved tomorrow I am OK with discharge in the AM.  He should be discharged on no diuretic.  2. Hyperkalemia- resolved 3. LLE cellulitis - better, s/p abx. 4. DM 2 - 18 yrs duration, on insulin 5. CKD - baseline creat 1.1-1.2 6. HTN - do not overtreat, let BP run higher if needed while giving volume 7. Dispo- As above, will d/c IVF.  If can keep up with losses with PO intake only and continue with renal function improvement can be discharged tomorrow    Kristopher Simon   02/08/2012,11:01 AM  LOS: 3 days

## 2012-02-08 NOTE — Progress Notes (Signed)
PCP: [facility], [place], [place]  Brief HPI:  This is a 46 year old male with past medical history of hypertension, diabetes type 2, coronary artery disease with a baseline creatinine of 1.0 back on May 30. Was admitted at [facility] for cellulitis on May 30 for cellulitis of the left lower extremity. He was started on IV vancomycin and Zosyn, was continued on his ACE inhibitor, was also put on Bactrim and over the next several days his creatinine climbed to 7.1. But because his creatinine was not improving he was transferred to [facility] for further evaluation.   Past medical history:  Past Medical History  Diagnosis Date  . Coronary artery disease   . Hypertension 1998  . Diabetes mellitus 1995  . Fibromyalgia   . Anxiety     Consultants: Nephrology  Procedures: Right subclavian placed at [facility]  Subjective: Patient with difficulty sleeping overnight. But otherwise feels well. Still with some nausea but improved. No pain. Left leg is better.  Objective: Vital signs in last 24 hours: Temp:  [97.7 F (36.5 C)-98.3 F (36.8 C)] 97.8 F (36.6 C) (06/19 0915) Pulse Rate:  [57-77] 70  (06/19 0915) Resp:  [18-20] 18  (06/19 0915) BP: (136-176)/(70-88) 176/88 mmHg (06/19 0915) SpO2:  [98 %-100 %] 98 % (06/19 0915) Weight:  [162.433 kg (358 lb 1.6 oz)] 162.433 kg (358 lb 1.6 oz) (06/18 2217) Weight change: 0.033 kg (1.2 oz) Last BM Date: 02/07/12  Intake/Output from previous day: 06/18 0701 - 06/19 0700 In: 3401.7 [P.O.:960; I.V.:2441.7] Out: 4350 [Urine:4350] Intake/Output this shift:    General appearance: alert, cooperative, appears stated age, no distress and moderately obese Resp: clear to auscultation bilaterally Cardio: regular rate and rhythm, S1, S2 normal, no murmur, click, rub or gallop GI: soft, non-tender; bowel sounds normal; no masses,  no organomegaly Extremities: extremities normal, atraumatic, no cyanosis or edema Pulses: 2+ and  symmetric Skin: mild erythema over left lower leg, much improved per patient Neurologic: Alert and oriented X 3, normal strength and tone. Normal symmetric reflexes. Normal coordination and gait  Lab Results:  Basename 02/07/12 0610 02/06/12 0600  WBC 8.1 9.7  HGB 10.3* 10.9*  HCT 31.9* 32.7*  PLT 484* 458*   BMET  Basename 02/08/12 0500 02/07/12 0610 02/06/12 0600 02/05/12 2040  NA 139 140 -- --  K 4.4 4.1 -- --  CL 103 98 -- --  CO2 25 27 -- --  GLUCOSE 157* 169* -- --  BUN 56* 79* -- --  CREATININE 4.51* 6.31* -- --  CALCIUM 9.7 9.6 -- --  ALT -- -- 6 6    Studies/Results: No results found.  Medications:  Scheduled:    . clopidogrel  75 mg Oral Q breakfast  . fentaNYL  75 mcg Transdermal Q72H  . heparin  5,000 Units Subcutaneous Q8H  . insulin aspart  0-9 Units Subcutaneous TID WC  . metoCLOPramide  5 mg Oral TID AC & HS  . metoprolol tartrate  25 mg Oral BID  . pantoprazole  40 mg Oral Q1200  . simvastatin  40 mg Oral q1800   Continuous:    . sodium chloride 100 mL/hr at 02/08/12 0156   [id number], acetaminophen, LORazepam, morphine injection, ondansetron (ZOFRAN) IV, ondansetron, polyethylene glycol, sodium chloride, zolpidem  Assessment/Plan:  Principal Problem:  *Acute kidney injury Active Problems:  Diabetes mellitus  HTN (hypertension)  Nausea & vomiting  Shortness of breath    Acute kidney injury  Most likely multifactorial to exposure to  vancomycin, ACE inhibitor and Bactrim. HIV non reactive, renal ultrasound negative for obstruction. Continue with IV fluids. Appreciate renal help. Creatinine trending down. Good urine output, likely post injury diuresis.   Diabetes mellitus  Continue sliding-scale insulin. hemoglobin A1c 7.8. Was on long acting insulin at home.  HTN (hypertension) Continue with metoprolol. Follow renal recommendation regarding BP control.   Nausea & vomiting  Vomiting has resolved. Nausea is improved. This was  probably secondary to uremia. Continue with protonix. Gastroparesis could also be present.  Shortness of breath Resolved. EKG sinus rhythm and chest x-ray: no acute cardiopulmonary process.   Left LE Cellulitis Resolved. Finished treatment at [place].   Code Status Full Code  DVT Prophylaxis Heparin SQ  Disposition Home once cleared by nephrology.   LOS: 3 days   [facility] Pager [phone] 02/08/2012, 9:29 AM

## 2012-02-09 DIAGNOSIS — N179 Acute kidney failure, unspecified: Secondary | ICD-10-CM

## 2012-02-09 DIAGNOSIS — I10 Essential (primary) hypertension: Secondary | ICD-10-CM

## 2012-02-09 DIAGNOSIS — E1165 Type 2 diabetes mellitus with hyperglycemia: Secondary | ICD-10-CM

## 2012-02-09 LAB — RENAL FUNCTION PANEL
Calcium: 9.9 mg/dL (ref 8.4–10.5)
GFR calc Af Amer: 23 mL/min — ABNORMAL LOW (ref >90)
GFR calc non Af Amer: 20 mL/min — ABNORMAL LOW (ref >90)
Glucose, Bld: 138 mg/dL — ABNORMAL HIGH (ref 70–99)
Phosphorus: 4.1 mg/dL (ref 2.3–4.6)
Potassium: 4.1 mEq/L (ref 3.5–5.1)
Sodium: 139 mEq/L (ref 135–145)

## 2012-02-09 LAB — GLUCOSE, CAPILLARY: Glucose-Capillary: 168 mg/dL — ABNORMAL HIGH (ref 70–99)

## 2012-02-09 MED ORDER — METOCLOPRAMIDE HCL 5 MG PO TABS: 5.0000 mg | ORAL_TABLET | Freq: Three times a day (TID) | ORAL | Status: DC

## 2012-02-09 MED ORDER — INSULIN DETEMIR 100 UNIT/ML ~~LOC~~ SOLN: 45.0000 [IU] | Freq: Two times a day (BID) | SUBCUTANEOUS | Status: DC

## 2012-02-09 MED ORDER — AMLODIPINE BESYLATE 10 MG PO TABS
10.0000 mg | ORAL_TABLET | Freq: Every day | ORAL | Status: DC
  Administered 2012-02-09: 10 mg via ORAL
  Filled 2012-02-09 (×2): qty 1

## 2012-02-09 MED ORDER — INSULIN DETEMIR 100 UNIT/ML ~~LOC~~ SOLN: 10.0000 [IU] | Freq: Every day | SUBCUTANEOUS | Status: DC

## 2012-02-09 MED ORDER — HYDRALAZINE HCL 20 MG/ML IJ SOLN
10.0000 mg | Freq: Once | INTRAMUSCULAR | Status: AC
  Administered 2012-02-09: 10 mg via INTRAVENOUS
  Filled 2012-02-09 (×2): qty 0.5

## 2012-02-09 NOTE — Progress Notes (Signed)
Subjective:  Creatinine down again, going home today.  Objective Vital signs in last 24 hours: Filed Vitals:   02/08/12 2104 02/09/12 0558 02/09/12 0941 02/09/12 0947  BP: 182/78 184/76 167/84 179/84  Pulse: 65 65  72  Temp: 98.6 F (37 C) 98.3 F (36.8 C)  98.4 F (36.9 C)  TempSrc: Oral Oral  Oral  Resp: 19 20  20   Height: 6\' 5"  (1.956 m)     Weight: 161.072 kg (355 lb 1.6 oz)     SpO2: 97% 99%  98%   Weight change: -1.361 kg (-3 lb)  Intake/Output Summary (Last 24 hours) at 02/09/12 1207 Last data filed at 02/09/12 0900  Gross per 24 hour  Intake    600 ml  Output   3851 ml  Net  -3251 ml   Labs: Basic Metabolic Panel:  Lab [id number] 0601 02/08/12 0500 02/07/12 0610  NA 139 139 140  K 4.1 4.4 4.1  CL 102 103 98  CO2 25 25 27   GLUCOSE 138* 157* 169*  BUN 41* 56* 79*  CREATININE 3.41* 4.51* 6.31*  CALCIUM 9.9 9.7 9.6  ALB -- -- --  PHOS 4.1 4.6 5.7*   Liver Function Tests:  Lab 02/09/12 0601 02/08/12 0500 02/07/12 0610 02/06/12 0600 02/05/12 2040  AST -- -- -- 8 8  ALT -- -- -- 6 6  ALKPHOS -- -- -- 77 83  BILITOT -- -- -- 0.3 0.3  PROT -- -- -- 7.8 8.3  ALBUMIN 3.5 3.4* 3.4* -- --   No results found for this basename: LIPASE:3,AMYLASE:3 in the last 168 hours No results found for this basename: AMMONIA:3 in the last 168 hours CBC:  Lab 02/07/12 0610 02/06/12 0600 02/05/12 2040  WBC 8.1 9.7 13.2*  NEUTROABS -- -- 10.5*  HGB 10.3* 10.9* 11.5*  HCT 31.9* 32.7* 34.6*  MCV 92.2 91.3 90.8  PLT 484* 458* 568*   Cardiac Enzymes:  Lab 02/05/12 2040  CKTOTAL 34  CKMB --  CKMBINDEX --  TROPONINI --   CBG:  Lab 02/09/12 1128 02/09/12 0805 02/08/12 2057 02/08/12 1701 02/08/12 1209  GLUCAP 168* 172* 132* 184* 167*    Iron Studies:  No results found for this basename: IRON,TIBC,TRANSFERRIN,FERRITIN in the last 72 hours Studies/Results: No results found. Medications: Infusions:    Scheduled Medications:    . amLODipine  10 mg Oral Daily  .  clopidogrel  75 mg Oral Q breakfast  . fentaNYL  75 mcg Transdermal Q72H  . heparin  5,000 Units Subcutaneous Q8H  . hydrALAZINE  10 mg Intravenous Once  . insulin aspart  0-9 Units Subcutaneous TID WC  . metoCLOPramide  5 mg Oral TID AC & HS  . metoprolol tartrate  25 mg Oral BID  . pantoprazole  40 mg Oral Q1200  . simvastatin  40 mg Oral q1800    have reviewed scheduled and prn medications.  Physical Exam: General: alert, no distress Heart: RRR Lungs: clear bilaterally Abdomen: obese, soft Extremities: no edema   I Assessment/ Plan: Pt is a 46 y.o. yo male who was admitted on 02/05/2012 with  Cellulitis and acute on chronic renal failure.  Assessment/Plan: 1. Acute renal failure - likely ATN due to ACEI, lowish BP's and Vanc toxicity, as well as historical NSAIDS and bactrim. Appeared to be dry at the time of admit, has been getting IVF. Great UOP and improvement in BUN and creatinine. He probably is having a post injury diuresis.  He is taking in POs  as well. He is encouraged to continue with drinking fluids as an OP.  He is not being discharged on a diuretic.  I anticipate that renal function will continue to improve.  He will follow up with PCP and only if kidney function does not improve we can see at [place] in follow up 2. Hyperkalemia- resolved 3. LLE cellulitis - better, s/p abx. 4. DM 2 - 18 yrs duration, on insulin 5. CKD - baseline creat 1.1-1.2 6. HTN - do not overtreat, let BP run higher if needed while giving volume Dispo- home today   Nhyira Leano A   02/09/2012,12:07 PM  LOS: 4 days

## 2012-02-09 NOTE — Discharge Summary (Signed)
Physician Discharge Summary  Patient ID: Kristopher Simon MRN: [id number] DOB/AGE: 1965-10-19 46 y.o.  Admit date: 02/05/2012 Discharge date: 02/09/2012  PCP: at [facility] in [place], [place]:  Principal Problem:  *Acute kidney injury Active Problems:  Diabetes mellitus  HTN (hypertension)  Nausea & vomiting  Shortness of breath   DISCHARGE CONDITION: fair  INITIAL HISTORY: This is a 46 year old male with past medical history of hypertension, diabetes type 2, coronary artery disease with a baseline creatinine of 1.0 back on May 30. Was admitted at [facility] for cellulitis on May 30 for cellulitis of the left lower extremity. He was started on IV vancomycin and Zosyn, was continued on his ACE inhibitor, was also put on Bactrim and over the next several days his creatinine climbed to 7.1. But because his creatinine was not improving he was transferred to [facility] for further evaluation.    HOSPITAL COURSE:   Acute kidney injury  After transfer from [place] patient was seen by nephrology here. They recommended that the patient be put on IV fluids. It was felt that he was clinically dehydrated. Lasix was discontinued. All nephrotoxic agents were stopped. Patient's renal function slowly started improving. He subsequently had post injury diuresis. Renal failure was thought to be multifactorial from exposure to vancomycin, ACE inhibitor and Bactrim. HIV non reactive, renal ultrasound was negative for obstruction. IV fluids were discontinued yesterday. Patient is able to maintain adequate input. His renal function is improving on a daily basis. So at this time he is considered stable for discharge. He will be asked to follow up with his primary care physician next week for repeat blood work. No ACE inhibitors for now.  Diabetes mellitus  Patient mentioned that he was taking Levemir at home. Since he had acute renal failure his Levemir was stopped. He was  just kept on sliding scale. His blood sugars have been reasonably well controlled, but in the last day or so it has been creeping up, but still less than 200. So I have explained to the patient that he should take a lower dose of Levemir at home. And, then increase the dose as his blood sugars go up. And, then he can followup with his doctor for further instruction. Hemoglobin A1c 7.8.    HTN (hypertension)  Blood pressure has been running high in the last few days. This is probably, because he's not on amlodipine, which he takes at home. This will be resumed today. He'll be given a dose of hydralazine. Once his blood pressure is better he can be discharged home. Continue with metoprolol.   Nausea & vomiting  Vomiting has resolved. Nausea is improved. This was probably secondary to uremia. Continue with protonix. Gastroparesis could also be present. So, the patient was started on Reglan, which will be continued.  Shortness of breath  Resolved. EKG sinus rhythm and chest x-ray: no acute cardiopulmonary process.   Left LE Cellulitis  This was the initial reason for admission to [facility] in [place]. Patient has completed course of antibiotics. His left leg shows mild erythema but is much better compared to his initial presentation. He's afebrile and his white cell count is normal.   Patient has significantly improved. He's very anxious to go home. Once his blood pressure is better he can be discharged.  He also had a right subclavian central line placed for poor venous access, which will be discontinued before he is discharged.  PERTINENT LABS: Sodium level was 132 at  admission. His chloride was 86. His BUN was 96 and creatinine was 8.72. Phosphorus was 8.1. His anemia panel was unremarkable. Total CK was normal. B12 was normal. TSH was 2.448. HbA1c was 7.8. HIV was nonreactive. MRSA, PCR was negative. His creatinine is improved to 3.41.   IMAGING STUDIES X-ray Chest Pa And Lateral    02/05/2012  *RADIOLOGY REPORT*  Clinical Data: Shortness of breath  CHEST - 2 VIEW  Comparison: 01/30/2012  Findings: Right subclavian approach central line tip terminates over the superior SVC.  Heart size is normal.  The lungs are clear. No pleural effusion.  No acute osseous finding.  IMPRESSION: No acute cardiopulmonary process.  Original Report Authenticated By: Arline Asp, M.D.   US Renal  02/06/2012  *RADIOLOGY REPORT*  Clinical Data: Acute renal failure.  RENAL/URINARY TRACT ULTRASOUND COMPLETE  Comparison:  Renal ultrasound 01/25/2012.  Findings:  Right Kidney:  Measures 13.0 cm and appears normal without stone, mass or hydronephrosis. Fatty infiltration of the liver noted.  Left Kidney:  Measures 14.6 cm and appears normal without stone, mass or hydronephrosis.  Bladder:  Unremarkable.  IMPRESSION: Negative for hydronephrosis or acute finding.  Fatty infiltration of the liver noted.  Both  Original Report Authenticated By: Arvid Right. D'ALESSIO, M.D.    DISCHARGE EXAMINATION: Blood pressure 184/76, pulse 65, temperature 98.3 F (36.8 C), temperature source Oral, resp. rate 20, height 6\' 5"  (1.956 m), weight 161.072 kg (355 lb 1.6 oz), SpO2 99.00%. General appearance: alert, cooperative, appears stated age and morbidly obese Resp: clear to auscultation bilaterally Cardio: regular rate and rhythm, S1, S2 normal, no murmur, click, rub or gallop GI: soft, non-tender; bowel sounds normal; no masses,  no organomegaly Extremities: Mild erythema over the left leg Neurologic: Grossly normal  DISPOSITION: Home  Discharge Orders    Future Orders Please Complete By Expires   Diet Carb Modified      Increase activity slowly      Discharge instructions      Comments:   Be sure to have your PCP check blood work (Basic Metabolic Panel) early next week to make sure your kidney function is improving.   Call MD for:      Comments:   Shortness of breath, leg swelling, fever, dizziness,  weakness     Current Discharge Medication List    START taking these medications   Details  metoCLOPramide (REGLAN) 5 MG tablet Take 1 tablet (5 mg total) by mouth 4 (four) times daily -  before meals and at bedtime. Qty: 120 tablet, Refills: 0      CONTINUE these medications which have CHANGED   Details  insulin detemir (LEVEMIR) 100 UNIT/ML injection Inject 10 Units into the skin at bedtime. Increase dose by 4 units every night for blood sugar greater than 200 in the morning Qty: 10 mL      CONTINUE these medications which have NOT CHANGED   Details  amLODipine (NORVASC) 10 MG tablet Take 10 mg by mouth daily.    clopidogrel (PLAVIX) 75 MG tablet Take 75 mg by mouth daily.    fentaNYL (DURAGESIC - DOSED MCG/HR) 50 MCG/HR Place 1 patch onto the skin every 3 (three) days.    metoprolol (LOPRESSOR) 50 MG tablet Take 50 mg by mouth 2 (two) times daily.    nystatin cream (MYCOSTATIN) Apply 1 application topically 3 (three) times daily.    pantoprazole (PROTONIX) 40 MG tablet Take 40 mg by mouth daily.    polyethylene glycol (MIRALAX / GLYCOLAX)  packet Take 17 g by mouth daily.    pravastatin (PRAVACHOL) 20 MG tablet Take 20 mg by mouth at bedtime.        Follow-up Information    Follow up with PCP at [facility] in [place]. Schedule an appointment as soon as possible for a visit in 5 days. (post hopsitalization follow up and blood work (Basic metabolic Panel))          TOTAL DISCHARGE TIME: 35 mins  [facility] Pager [phone]  02/09/2012, 8:37 AM

## 2012-02-09 NOTE — Progress Notes (Signed)
CVL  Was d/c,d/c instructions and appointments prescription were given to pt. Pt. Ready to go home.

## 2012-02-09 NOTE — Discharge Instructions (Signed)
Kidney Failure  In kidney failure, the kidneys lose their ability to filter enough waste products from the blood. They also lose the ability to regulate the body's balance of salt and water. Eventually, the kidneys slow their production of urine or stop producing it completely. Waste products and water gather in the body. This can lead to a life-threatening overload of fluids (such as heart failure). It can also lead to a dangerous buildup of waste products in the blood. These extreme changes in blood chemistry can affect the function of the heart and brain.   TYPES OF KIDNEY FAILURE  Acute kidney failure. In this form of kidney failure, the kidneys stop working properly because of a sudden illness, a medicine, or a medical condition that causes one of the following:    A severe drop in blood pressure or an interruption in the normal blood flow to the kidneys. This can occur during:   Major surgery.   Severe burns with fluid loss.   Massive bleeding.   A heart attack that severely affects heart function.   Blood clots that travel to the kidney.   Direct damage to kidney cells or to the kidneys' filtering units. This can be caused by:   An inflammation of the kidneys.   Toxic chemicals.   Medicines or infections.   Blocked urine flow from the kidney. This can occur because of obstructions outside the kidney, such as:   Kidney stones.   Bladder tumors.   An enlarged prostate.  Blockage of urine flow within the kidney can also cause sudden kidney failure, as can occur with large muscle injuries.   Chronic kidney failure. In this form of kidney failure, the kidney gradually loses function. This happens over a period of years. It is a slow and gradual loss of the ability of the kidneys to send out wastes, concentrate urine, and conserve the salts in your blood. Some of the causes of chronic kidney failure are:   Diabetes (very common cause).   Polycystic kidney disease.   Glomerulonephritis.   Alport  syndrome.   The flow of urine out of the kidney is blocked (obstructive uropathy).   High blood pressure (very common).   Long-term exposure to lead, mercury, and other chemicals and medicines.   Kidney stones with infection.   Reflux nephropathy.   Pain medicine overuse.  Some forms of chronic kidney failure run in families. Your caregiver will ask you about family medical problems.   End-stage kidney disease (ESKD). This is also called end-stage kidney failure. In ESKD, kidney function worsens until the person dies. This is usually the result of longstanding chronic kidney failure, but sometimes it follows acute kidney failure.  SYMPTOMS   Symptoms vary depending on the type of kidney failure.    Acute kidney failure. Symptoms include:   Swelling (edema) resulting from salt and water overload.   High blood pressure.   Vomiting.   Tiredness (lethargy) caused by the toxic effects of waste products on brain function.   Feeling sick to your stomach (nauseous).   Decreased urine output.   Chronic kidney failure and ERSD. Because the kidney damage in chronic kidney failure occurs slowly over a long time, symptoms develop slowly. Symptoms can include:   Headache.   Weakness.   Itching.   Vomiting.   Pale skin.   Slowing of growth in children.   Fatigue.   Tiredness (lethargy).   Poor appetite.   Increased thirst.   High blood pressure.     Bone damage in adults.  DIAGNOSIS   If you have an illness or medical condition that increases the risk of acute kidney failure, your caregivers will watch you closely. You may have blood and urine tests that measure the function of your kidneys. If you have a medical condition that increases the risk of long-term kidney damage, your caregiver will check your blood pressure and look for symptoms of chronic kidney failure during rechecks.  TREATMENT   Treatment depends on the type of kidney failure.    Acute kidney failure. Treatment begins with measures to  correct the cause of kidney failure (shock, hemorrhage, burns, heart attack). After this has begun, more specific kidney treatment may include:   Fluids given through the vein (intravenously) to correct any abnormal fluid loss.   Medicines called diuretics that increase urine output.   Limited fluids by mouth.   A diet low in protein and high in carbohydrates.   Medicines to adjust high or low levels of blood chemicals, such as potassium and medicines to control high blood pressure.   Short-term dialysis may be necessary if the patient develops severe high blood pressure, severe fluid overload, heart failure, symptoms of altered brain function, or severe abnormalities in blood chemistry.   Chronic kidney failure. People with chronic kidney failure are watched closely. They receive frequent physical exams, blood pressure checks, and blood testing. Treatment includes:   A low-protein and low-salt diet.   Medicines to adjust blood chemical levels.   Medicines to treat high blood pressure.   Sometimes, a hormonal medicine called erythropoietin is given to correct a low level of red blood cells (anemia).   ESKD. Treatment includes:   Dialysis until a donor can be found for a kidney transplant. Dialysis mechanically removes waste products from the blood.   Both kidneys may need to be removed surgically before a transplant in patients with severe high blood pressure or chronic pyelonephritis.  PROGNOSIS    Acute kidney failure may go away on its own. Some people recover within a matter of days. Exactly how long the illness lasts varies greatly from person-to-person. The duration depends on the cause of the kidney problem. In rare cases, acute kidney failure progresses to ESKD. Among people who recover, about 50% have some permanent kidney damage. In most cases, this is not severe enough to prevent you from living a normal life.   Chronic kidney failure is a lifelong problem that can worsen over time to  become ESKD. Not everyone develops ESKD. For those who do, the time it takes for ESKD to develop varies from person-to-person.   ESKD is a permanent condition that can be treated only with dialysis or a kidney transplant.  PREVENTION   Many forms of kidney failure cannot be prevented. People who have diabetes, high blood pressure, or coronary artery disease should try to control the illness with:   Appropriate diet.   Medicine.   Lifestyle changes.  If you have chronic kidney failure, you should tell all caregivers who treat you.   HOME CARE INSTRUCTIONS    Follow your diet and take your medicines as instructed.   Do not use any new medicines (prescription, over-the-counter, or nutritional supplements) unless approved by your caregiver. Many medicines can worsen your kidney damage or need to have the dose adjusted.   If dialysis is scheduled, keep all appointments. Call if you are unable to keep an appointment.  SEEK MEDICAL CARE IF:    You develop unexplained weakness, tiredness,   or appetite loss.   You feel poorly with no clear explanation.  SEEK IMMEDIATE MEDICAL CARE IF:    The amount of urine you produce either distinctly increases or decreases.   You develop swelling of the face and/or ankles.   You develop shortness of breath.  FOR MORE INFORMATION   National Institute of Diabetes and Digestive and Kidney Diseases: www2.niddk.nih.gov  National Kidney Foundation: www.kidney.org  Document Released: 08/08/2005 Document Revised: 07/28/2011 Document Reviewed: 12/09/2009  [employer] Patient Information 2012 [employer].

## 2012-02-27 ENCOUNTER — Other Ambulatory Visit: Payer: Self-pay | Admitting: Family Medicine

## 2012-08-22 DIAGNOSIS — I82409 Acute embolism and thrombosis of unspecified deep veins of unspecified lower extremity: Secondary | ICD-10-CM

## 2012-08-22 HISTORY — DX: Acute embolism and thrombosis of unspecified deep veins of unspecified lower extremity: I82.409

## 2012-08-22 HISTORY — PX: HIP FRACTURE SURGERY: SHX118

## 2013-04-25 DIAGNOSIS — S32409A Unspecified fracture of unspecified acetabulum, initial encounter for closed fracture: Secondary | ICD-10-CM | POA: Insufficient documentation

## 2013-04-25 DIAGNOSIS — Z7901 Long term (current) use of anticoagulants: Secondary | ICD-10-CM | POA: Insufficient documentation

## 2013-05-02 DIAGNOSIS — S32423A Displaced fracture of posterior wall of unspecified acetabulum, initial encounter for closed fracture: Secondary | ICD-10-CM | POA: Insufficient documentation

## 2013-07-24 DIAGNOSIS — R6889 Other general symptoms and signs: Secondary | ICD-10-CM

## 2013-07-24 DIAGNOSIS — IMO0001 Reserved for inherently not codable concepts without codable children: Secondary | ICD-10-CM | POA: Insufficient documentation

## 2014-03-04 DIAGNOSIS — E119 Type 2 diabetes mellitus without complications: Secondary | ICD-10-CM | POA: Insufficient documentation

## 2014-07-05 ENCOUNTER — Encounter (HOSPITAL_COMMUNITY): Payer: Self-pay | Admitting: Emergency Medicine

## 2014-07-05 DIAGNOSIS — K219 Gastro-esophageal reflux disease without esophagitis: Secondary | ICD-10-CM | POA: Diagnosis present

## 2014-07-05 DIAGNOSIS — L03116 Cellulitis of left lower limb: Principal | ICD-10-CM | POA: Diagnosis present

## 2014-07-05 DIAGNOSIS — Z794 Long term (current) use of insulin: Secondary | ICD-10-CM

## 2014-07-05 DIAGNOSIS — Z881 Allergy status to other antibiotic agents status: Secondary | ICD-10-CM

## 2014-07-05 DIAGNOSIS — E860 Dehydration: Secondary | ICD-10-CM | POA: Diagnosis present

## 2014-07-05 DIAGNOSIS — Z7982 Long term (current) use of aspirin: Secondary | ICD-10-CM

## 2014-07-05 DIAGNOSIS — N19 Unspecified kidney failure: Secondary | ICD-10-CM | POA: Diagnosis present

## 2014-07-05 DIAGNOSIS — F419 Anxiety disorder, unspecified: Secondary | ICD-10-CM | POA: Diagnosis present

## 2014-07-05 DIAGNOSIS — I2510 Atherosclerotic heart disease of native coronary artery without angina pectoris: Secondary | ICD-10-CM | POA: Diagnosis present

## 2014-07-05 DIAGNOSIS — I10 Essential (primary) hypertension: Secondary | ICD-10-CM | POA: Diagnosis present

## 2014-07-05 DIAGNOSIS — E871 Hypo-osmolality and hyponatremia: Secondary | ICD-10-CM | POA: Diagnosis present

## 2014-07-05 DIAGNOSIS — E1310 Other specified diabetes mellitus with ketoacidosis without coma: Secondary | ICD-10-CM | POA: Diagnosis present

## 2014-07-05 NOTE — ED Notes (Signed)
PT st's he woke up this am with redness in left lower leg and painful.  Pt st's he has had cellulitis before and this feels the same

## 2014-07-06 ENCOUNTER — Encounter (HOSPITAL_COMMUNITY): Payer: Self-pay | Admitting: Emergency Medicine

## 2014-07-06 ENCOUNTER — Inpatient Hospital Stay (HOSPITAL_COMMUNITY)
Admission: EM | Admit: 2014-07-06 | Discharge: 2014-07-11 | DRG: 602 | Disposition: A | Discharge status: Home / Self Care | Payer: Self-pay | Attending: Internal Medicine | Admitting: Internal Medicine

## 2014-07-06 DIAGNOSIS — L03116 Cellulitis of left lower limb: Secondary | ICD-10-CM

## 2014-07-06 DIAGNOSIS — E1310 Other specified diabetes mellitus with ketoacidosis without coma: Secondary | ICD-10-CM

## 2014-07-06 DIAGNOSIS — E1110 Type 2 diabetes mellitus with ketoacidosis without coma: Secondary | ICD-10-CM | POA: Diagnosis present

## 2014-07-06 DIAGNOSIS — K219 Gastro-esophageal reflux disease without esophagitis: Secondary | ICD-10-CM | POA: Diagnosis present

## 2014-07-06 DIAGNOSIS — I10 Essential (primary) hypertension: Secondary | ICD-10-CM | POA: Diagnosis present

## 2014-07-06 DIAGNOSIS — E871 Hypo-osmolality and hyponatremia: Secondary | ICD-10-CM | POA: Diagnosis present

## 2014-07-06 DIAGNOSIS — E0810 Diabetes mellitus due to underlying condition with ketoacidosis without coma: Secondary | ICD-10-CM

## 2014-07-06 DIAGNOSIS — R000 Tachycardia, unspecified: Secondary | ICD-10-CM | POA: Diagnosis present

## 2014-07-06 DIAGNOSIS — I2510 Atherosclerotic heart disease of native coronary artery without angina pectoris: Secondary | ICD-10-CM

## 2014-07-06 HISTORY — DX: Concussion with loss of consciousness of unspecified duration, initial encounter: S06.0X9A

## 2014-07-06 HISTORY — DX: Cellulitis of left lower limb: L03.116

## 2014-07-06 HISTORY — DX: Acute kidney failure, unspecified: N17.9

## 2014-07-06 HISTORY — DX: Cellulitis, unspecified: L03.90

## 2014-07-06 HISTORY — DX: Essential (primary) hypertension: I10

## 2014-07-06 HISTORY — DX: Concussion with loss of consciousness status unknown, initial encounter: S06.0XAA

## 2014-07-06 HISTORY — DX: Atherosclerotic heart disease of native coronary artery without angina pectoris: I25.10

## 2014-07-06 LAB — COMPREHENSIVE METABOLIC PANEL
ALBUMIN: 3.5 g/dL (ref 3.5–5.2)
ALT: 23 U/L (ref 0–53)
ANION GAP: 20 — AB (ref 5–15)
AST: 16 U/L (ref 0–37)
Alkaline Phosphatase: 73 U/L (ref 39–117)
BUN: 24 mg/dL — AB (ref 6–23)
CALCIUM: 9.5 mg/dL (ref 8.4–10.5)
CO2: 21 mEq/L (ref 19–32)
CREATININE: 1.27 mg/dL (ref 0.50–1.35)
Chloride: 84 mEq/L — ABNORMAL LOW (ref 96–112)
GFR calc Af Amer: 76 mL/min — ABNORMAL LOW (ref >90)
GFR calc non Af Amer: 65 mL/min — ABNORMAL LOW (ref >90)
Glucose, Bld: 529 mg/dL — ABNORMAL HIGH (ref 70–99)
Potassium: 4.2 mEq/L (ref 3.7–5.3)
Sodium: 125 mEq/L — ABNORMAL LOW (ref 137–147)
TOTAL PROTEIN: 8 g/dL (ref 6.0–8.3)
Total Bilirubin: 0.4 mg/dL (ref 0.3–1.2)

## 2014-07-06 LAB — CBC WITH DIFFERENTIAL/PLATELET
BASOS ABS: 0 10*3/uL (ref 0.0–0.1)
BASOS PCT: 0 % (ref 0–1)
EOS ABS: 0.1 10*3/uL (ref 0.0–0.7)
Eosinophils Relative: 1 % (ref 0–5)
HEMATOCRIT: 43.8 % (ref 39.0–52.0)
HEMOGLOBIN: 15.3 g/dL (ref 13.0–17.0)
Lymphocytes Relative: 13 % (ref 12–46)
Lymphs Abs: 1.4 10*3/uL (ref 0.7–4.0)
MCH: 31.9 pg (ref 26.0–34.0)
MCHC: 34.9 g/dL (ref 30.0–36.0)
MCV: 91.3 fL (ref 78.0–100.0)
MONO ABS: 1.2 10*3/uL — AB (ref 0.1–1.0)
MONOS PCT: 12 % (ref 3–12)
NEUTROS ABS: 7.8 10*3/uL — AB (ref 1.7–7.7)
Neutrophils Relative %: 74 % (ref 43–77)
Platelets: 245 10*3/uL (ref 150–400)
RBC: 4.8 MIL/uL (ref 4.22–5.81)
RDW: 13.1 % (ref 11.5–15.5)
WBC: 10.5 10*3/uL (ref 4.0–10.5)

## 2014-07-06 LAB — BASIC METABOLIC PANEL
ANION GAP: 21 — AB (ref 5–15)
ANION GAP: 16 — AB (ref 5–15)
ANION GAP: 14 (ref 5–15)
Anion gap: 16 — ABNORMAL HIGH (ref 5–15)
BUN: 21 mg/dL (ref 6–23)
BUN: 18 mg/dL (ref 6–23)
BUN: 18 mg/dL (ref 6–23)
BUN: 18 mg/dL (ref 6–23)
CALCIUM: 8.7 mg/dL (ref 8.4–10.5)
CALCIUM: 8.7 mg/dL (ref 8.4–10.5)
CHLORIDE: 95 meq/L — AB (ref 96–112)
CHLORIDE: 96 meq/L (ref 96–112)
CO2: 21 meq/L (ref 19–32)
CO2: 21 meq/L (ref 19–32)
CO2: 24 meq/L (ref 19–32)
CO2: 21 mEq/L (ref 19–32)
CREATININE: 1.15 mg/dL (ref 0.50–1.35)
CREATININE: 1.01 mg/dL (ref 0.50–1.35)
CREATININE: 1.01 mg/dL (ref 0.50–1.35)
Calcium: 8.8 mg/dL (ref 8.4–10.5)
Calcium: 8.9 mg/dL (ref 8.4–10.5)
Chloride: 90 mEq/L — ABNORMAL LOW (ref 96–112)
Chloride: 94 mEq/L — ABNORMAL LOW (ref 96–112)
Creatinine, Ser: 0.86 mg/dL (ref 0.50–1.35)
GFR calc Af Amer: 85 mL/min — ABNORMAL LOW (ref >90)
GFR calc Af Amer: >90 mL/min (ref >90)
GFR calc Af Amer: >90 mL/min (ref >90)
GFR calc non Af Amer: 74 mL/min — ABNORMAL LOW (ref >90)
GFR calc non Af Amer: 86 mL/min — ABNORMAL LOW (ref >90)
GFR calc non Af Amer: 86 mL/min — ABNORMAL LOW (ref >90)
GLUCOSE: 251 mg/dL — AB (ref 70–99)
Glucose, Bld: 342 mg/dL — ABNORMAL HIGH (ref 70–99)
Glucose, Bld: 209 mg/dL — ABNORMAL HIGH (ref 70–99)
Glucose, Bld: 227 mg/dL — ABNORMAL HIGH (ref 70–99)
POTASSIUM: 3.8 meq/L (ref 3.7–5.3)
Potassium: 4.2 mEq/L (ref 3.7–5.3)
Potassium: 3.9 mEq/L (ref 3.7–5.3)
Potassium: 4.3 mEq/L (ref 3.7–5.3)
SODIUM: 131 meq/L — AB (ref 137–147)
Sodium: 132 mEq/L — ABNORMAL LOW (ref 137–147)
Sodium: 132 mEq/L — ABNORMAL LOW (ref 137–147)
Sodium: 134 mEq/L — ABNORMAL LOW (ref 137–147)

## 2014-07-06 LAB — GLUCOSE, CAPILLARY
GLUCOSE-CAPILLARY: 213 mg/dL — AB (ref 70–99)
Glucose-Capillary: 220 mg/dL — ABNORMAL HIGH (ref 70–99)
Glucose-Capillary: 219 mg/dL — ABNORMAL HIGH (ref 70–99)
Glucose-Capillary: 218 mg/dL — ABNORMAL HIGH (ref 70–99)
Glucose-Capillary: 229 mg/dL — ABNORMAL HIGH (ref 70–99)
Glucose-Capillary: 238 mg/dL — ABNORMAL HIGH (ref 70–99)
Glucose-Capillary: 262 mg/dL — ABNORMAL HIGH (ref 70–99)
Glucose-Capillary: 234 mg/dL — ABNORMAL HIGH (ref 70–99)
Glucose-Capillary: 246 mg/dL — ABNORMAL HIGH (ref 70–99)

## 2014-07-06 LAB — CBG MONITORING, ED
GLUCOSE-CAPILLARY: 311 mg/dL — AB (ref 70–99)
GLUCOSE-CAPILLARY: 283 mg/dL — AB (ref 70–99)
GLUCOSE-CAPILLARY: 276 mg/dL — AB (ref 70–99)
GLUCOSE-CAPILLARY: 252 mg/dL — AB (ref 70–99)
Glucose-Capillary: 422 mg/dL — ABNORMAL HIGH (ref 70–99)
Glucose-Capillary: 401 mg/dL — ABNORMAL HIGH (ref 70–99)
Glucose-Capillary: 369 mg/dL — ABNORMAL HIGH (ref 70–99)
Glucose-Capillary: 343 mg/dL — ABNORMAL HIGH (ref 70–99)

## 2014-07-06 LAB — CBC
HEMATOCRIT: 39.9 % (ref 39.0–52.0)
Hemoglobin: 13.4 g/dL (ref 13.0–17.0)
MCH: 31.2 pg (ref 26.0–34.0)
MCHC: 33.6 g/dL (ref 30.0–36.0)
MCV: 92.8 fL (ref 78.0–100.0)
PLATELETS: 215 10*3/uL (ref 150–400)
RBC: 4.3 MIL/uL (ref 4.22–5.81)
RDW: 13.3 % (ref 11.5–15.5)
WBC: 8.7 10*3/uL (ref 4.0–10.5)

## 2014-07-06 LAB — MRSA PCR SCREENING: MRSA by PCR: NEGATIVE

## 2014-07-06 MED ORDER — SODIUM CHLORIDE 0.9 % IV SOLN
INTRAVENOUS | Status: DC
  Administered 2014-07-06: 8.5 [IU]/h via INTRAVENOUS
  Filled 2014-07-06 (×2): qty 2.5

## 2014-07-06 MED ORDER — AMLODIPINE BESYLATE 10 MG PO TABS
10.0000 mg | ORAL_TABLET | Freq: Every day | ORAL | Status: DC
  Administered 2014-07-06 – 2014-07-07 (×2): 10 mg via ORAL
  Filled 2014-07-06: qty 1
  Filled 2014-07-06: qty 2

## 2014-07-06 MED ORDER — CLINDAMYCIN PHOSPHATE 300 MG/50ML IV SOLN
300.0000 mg | Freq: Three times a day (TID) | INTRAVENOUS | Status: DC
  Administered 2014-07-06 – 2014-07-11 (×15): 300 mg via INTRAVENOUS
  Filled 2014-07-06 (×19): qty 50

## 2014-07-06 MED ORDER — PIPERACILLIN-TAZOBACTAM 3.375 G IVPB
3.3750 g | Freq: Three times a day (TID) | INTRAVENOUS | Status: DC
  Administered 2014-07-06 – 2014-07-11 (×15): 3.375 g via INTRAVENOUS
  Filled 2014-07-06 (×18): qty 50

## 2014-07-06 MED ORDER — FENTANYL 50 MCG/HR TD PT72
50.0000 ug | MEDICATED_PATCH | TRANSDERMAL | Status: DC
Start: 2014-07-06 — End: 2014-07-07
  Administered 2014-07-06: 50 ug via TRANSDERMAL
  Filled 2014-07-06: qty 2

## 2014-07-06 MED ORDER — MORPHINE SULFATE 2 MG/ML IJ SOLN
2.0000 mg | INTRAMUSCULAR | Status: DC | PRN
  Administered 2014-07-06 – 2014-07-07 (×4): 2 mg via INTRAVENOUS
  Filled 2014-07-06 (×4): qty 1

## 2014-07-06 MED ORDER — CLINDAMYCIN PHOSPHATE 900 MG/50ML IV SOLN
900.0000 mg | Freq: Once | INTRAVENOUS | Status: AC
  Administered 2014-07-06: 900 mg via INTRAVENOUS
  Filled 2014-07-06: qty 50

## 2014-07-06 MED ORDER — SODIUM CHLORIDE 0.9 % IV SOLN
INTRAVENOUS | Status: DC
  Administered 2014-07-06: 3.4 [IU]/h via INTRAVENOUS
  Administered 2014-07-06: 3.1 [IU]/h via INTRAVENOUS
  Filled 2014-07-06: qty 2.5

## 2014-07-06 MED ORDER — PRAVASTATIN SODIUM 20 MG PO TABS
20.0000 mg | ORAL_TABLET | Freq: Every day | ORAL | Status: DC
  Administered 2014-07-06 – 2014-07-10 (×5): 20 mg via ORAL
  Filled 2014-07-06 (×6): qty 1

## 2014-07-06 MED ORDER — DEXTROSE 50 % IV SOLN: 25.0000 mL | INTRAVENOUS | Status: DC | PRN

## 2014-07-06 MED ORDER — SODIUM CHLORIDE 0.9 % IV SOLN
INTRAVENOUS | Status: DC
  Administered 2014-07-06: 10:00:00 via INTRAVENOUS

## 2014-07-06 MED ORDER — TRAZODONE HCL 50 MG PO TABS
50.0000 mg | ORAL_TABLET | Freq: Once | ORAL | Status: AC
  Administered 2014-07-06: 50 mg via ORAL
  Filled 2014-07-06: qty 1

## 2014-07-06 MED ORDER — POTASSIUM CHLORIDE 10 MEQ/100ML IV SOLN
10.0000 meq | INTRAVENOUS | Status: AC
  Administered 2014-07-06: 10 meq via INTRAVENOUS
  Filled 2014-07-06: qty 100

## 2014-07-06 MED ORDER — CLOPIDOGREL BISULFATE 75 MG PO TABS
75.0000 mg | ORAL_TABLET | Freq: Every day | ORAL | Status: DC
  Administered 2014-07-06 – 2014-07-11 (×4): 75 mg via ORAL
  Filled 2014-07-06 (×6): qty 1

## 2014-07-06 MED ORDER — INSULIN DETEMIR 100 UNIT/ML ~~LOC~~ SOLN
45.0000 [IU] | Freq: Two times a day (BID) | SUBCUTANEOUS | Status: DC
  Filled 2014-07-06: qty 0.45

## 2014-07-06 MED ORDER — INSULIN ASPART 100 UNIT/ML ~~LOC~~ SOLN: 0.0000 [IU] | Freq: Every day | SUBCUTANEOUS | Status: DC

## 2014-07-06 MED ORDER — GABAPENTIN 100 MG PO CAPS
100.0000 mg | ORAL_CAPSULE | Freq: Three times a day (TID) | ORAL | Status: DC
  Administered 2014-07-06 – 2014-07-07 (×5): 100 mg via ORAL
  Filled 2014-07-06 (×7): qty 1

## 2014-07-06 MED ORDER — FENTANYL CITRATE 0.05 MG/ML IJ SOLN
50.0000 ug | INTRAMUSCULAR | Status: AC | PRN
  Administered 2014-07-06 (×2): 50 ug via INTRAVENOUS
  Filled 2014-07-06 (×2): qty 2

## 2014-07-06 MED ORDER — SODIUM CHLORIDE 0.9 % IJ SOLN
3.0000 mL | Freq: Two times a day (BID) | INTRAMUSCULAR | Status: DC
  Administered 2014-07-06 – 2014-07-11 (×4): 3 mL via INTRAVENOUS

## 2014-07-06 MED ORDER — PANTOPRAZOLE SODIUM 40 MG PO TBEC
40.0000 mg | DELAYED_RELEASE_TABLET | Freq: Every day | ORAL | Status: DC
  Administered 2014-07-06 – 2014-07-10 (×5): 40 mg via ORAL
  Filled 2014-07-06 (×4): qty 1

## 2014-07-06 MED ORDER — SODIUM CHLORIDE 0.9 % IV BOLUS (SEPSIS)
2000.0000 mL | Freq: Once | INTRAVENOUS | Status: AC
  Administered 2014-07-06: 2000 mL via INTRAVENOUS

## 2014-07-06 MED ORDER — ACETAMINOPHEN 325 MG PO TABS: 650.0000 mg | ORAL_TABLET | Freq: Four times a day (QID) | ORAL | Status: DC | PRN

## 2014-07-06 MED ORDER — INSULIN ASPART 100 UNIT/ML ~~LOC~~ SOLN: 0.0000 [IU] | Freq: Three times a day (TID) | SUBCUTANEOUS | Status: DC

## 2014-07-06 MED ORDER — SODIUM CHLORIDE 0.9 % IV SOLN
INTRAVENOUS | Status: DC
  Administered 2014-07-06: 125 mL/h via INTRAVENOUS
  Administered 2014-07-07: 150 mL/h via INTRAVENOUS

## 2014-07-06 MED ORDER — ASPIRIN EC 81 MG PO TBEC
81.0000 mg | DELAYED_RELEASE_TABLET | Freq: Every day | ORAL | Status: DC
  Administered 2014-07-06 – 2014-07-11 (×6): 81 mg via ORAL
  Filled 2014-07-06 (×7): qty 1

## 2014-07-06 MED ORDER — DEXTROSE-NACL 5-0.45 % IV SOLN
INTRAVENOUS | Status: DC
  Administered 2014-07-06 – 2014-07-07 (×2): via INTRAVENOUS

## 2014-07-06 MED ORDER — HEPARIN SODIUM (PORCINE) 5000 UNIT/ML IJ SOLN
5000.0000 [IU] | Freq: Three times a day (TID) | INTRAMUSCULAR | Status: DC
  Administered 2014-07-06 – 2014-07-11 (×16): 5000 [IU] via SUBCUTANEOUS
  Filled 2014-07-06 (×20): qty 1

## 2014-07-06 MED ORDER — SACCHAROMYCES BOULARDII 250 MG PO CAPS
250.0000 mg | ORAL_CAPSULE | Freq: Two times a day (BID) | ORAL | Status: DC
  Administered 2014-07-06 – 2014-07-11 (×11): 250 mg via ORAL
  Filled 2014-07-06 (×12): qty 1

## 2014-07-06 MED ORDER — TRAZODONE HCL 50 MG PO TABS: 50.0000 mg | ORAL_TABLET | Freq: Every day | ORAL | Status: DC

## 2014-07-06 MED ORDER — SODIUM CHLORIDE 0.9 % IV SOLN: INTRAVENOUS | Status: DC

## 2014-07-06 MED ORDER — SODIUM CHLORIDE 0.9 % IV SOLN
INTRAVENOUS | Status: AC
  Administered 2014-07-06: 09:00:00 via INTRAVENOUS

## 2014-07-06 MED ORDER — ACETAMINOPHEN 650 MG RE SUPP: 650.0000 mg | Freq: Four times a day (QID) | RECTAL | Status: DC | PRN

## 2014-07-06 MED ORDER — DEXTROSE-NACL 5-0.45 % IV SOLN: INTRAVENOUS | Status: DC

## 2014-07-06 MED ORDER — INSULIN REGULAR BOLUS VIA INFUSION
0.0000 [IU] | Freq: Three times a day (TID) | INTRAVENOUS | Status: DC
  Filled 2014-07-06: qty 10

## 2014-07-06 MED ORDER — METOPROLOL TARTRATE 100 MG PO TABS
100.0000 mg | ORAL_TABLET | Freq: Two times a day (BID) | ORAL | Status: DC
  Administered 2014-07-06 – 2014-07-11 (×11): 100 mg via ORAL
  Filled 2014-07-06 (×2): qty 4
  Filled 2014-07-06 (×11): qty 1

## 2014-07-06 NOTE — Plan of Care (Signed)
Problem: Phase I Progression Outcomes Goal: Diabetes Coordinator Consult Outcome: Progressing

## 2014-07-06 NOTE — Plan of Care (Signed)
Problem: Consults Goal: Diabetes Guidelines if Diabetic/Glucose > 140 If diabetic or lab glucose is > 140 mg/dl - Initiate Diabetes/Hyperglycemia Guidelines & Document Interventions  Outcome: Completed/Met Date Met:  07/06/14  Problem: Phase I Progression Outcomes Goal: Voiding-avoid urinary catheter unless indicated Outcome: Completed/Met Date Met:  07/06/14

## 2014-07-06 NOTE — Plan of Care (Signed)
Problem: Consults Goal: Diabetic Ketoacidosis (DKA) Patient Education See Patient Education Modules for education specifics. Outcome: Completed/Met Date Met:  07/06/14 Goal: Skin Care Protocol Initiated - if Braden Score 18 or less If consults are not indicated, leave blank or document N/A Outcome: Completed/Met Date Met:  07/06/14  Problem: Phase I Progression Outcomes Goal: CBGs steadily decreasing on IV insulin drip Outcome: Completed/Met Date Met:  07/06/14 Goal: Acidosis resolving Outcome: Progressing Goal: NPO or per MD order Outcome: Completed/Met Date Met:  07/06/14 Goal: K+ level approaching normal with therapy Outcome: Completed/Met Date Met:  07/06/14 Goal: Nausea/vomiting controlled with antiemetics Outcome: Completed/Met Date Met:  07/06/14 Goal: Voiding-avoid urinary catheter unless indicated Outcome: Completed/Met Date Met:  07/06/14 Goal: Pt. states reason for hospitalization Outcome: Completed/Met Date Met:  07/06/14

## 2014-07-06 NOTE — ED Notes (Signed)
Unable to start second line after 3 attempts, asking another nurse to attempt.

## 2014-07-06 NOTE — ED Notes (Signed)
Attempted to report  

## 2014-07-06 NOTE — ED Notes (Signed)
Pt states that the pressure and tightness in the left leg has decreased

## 2014-07-06 NOTE — Plan of Care (Signed)
Problem: Phase I Progression Outcomes Goal: Pain controlled with appropriate interventions Outcome: Completed/Met Date Met:  07/06/14  Problem: Phase II Progression Outcomes Goal: Vital signs remain stable Outcome: Completed/Met Date Met:  07/06/14

## 2014-07-06 NOTE — H&P (Signed)
[facility] History and Physical  Sriharsha Mcmaken [id number] DOB: March 20, 1966 DOA: 07/06/2014  Referring physician: Emergency Department PCP: Default, Provider, MD  Specialists:   Chief Complaint: LLE cellulitis, DKA  HPI: Kristopher Simon is a 48 y.o. male  With a hx of actively treated HTN, DM, prior hx of ARF who presents initially to the ED with LLE swelling and pain. The patient was found to have recurrent LLE cellulitis and was started on abx. Pt had no presenting leukocytosis and no fevers. On workup, the patient was found to have markedly elevated glucose of 529 with an elevated GAP of 20. The patient was started on IV insulin gtt with IVF and hospitalist service consulted for admission. Pt found to have Na of 125  Review of Systems:  Per above, the remainder of the 10pt ros reviewed and are neg  Past Medical History  Diagnosis Date  . Coronary artery disease   . Hypertension 1998  . Diabetes mellitus 1995  . Fibromyalgia   . Anxiety   . Cellulitis   . Concussion   . Acute renal failure 01/2012   Past Surgical History  Procedure Laterality Date  . Cardiac catheterization    . Fracture surgery    . Hip fracture surgery     Social History:  reports that he has never smoked. He does not have any smokeless tobacco history on file. He reports that he drinks about 0.6 oz of alcohol per week. He reports that he does not use illicit drugs.  where does patient live--home, ALF, SNF? and with whom if at home?  Can patient participate in ADLs?  Allergies  Allergen Reactions  . Bactrim [Sulfamethoxazole-Trimethoprim] Swelling    "tongue swelling"  . Ibuprofen     Kidney issues    Family History  Problem Relation Age of Onset  . Hypertension Mother   . Dementia Father   . Alzheimer's disease Father     (be sure to complete)  Prior to Admission medications   Medication Sig Start Date End Date Taking? Authorizing Provider  aspirin EC 81 MG tablet Take 81 mg by mouth  daily.   Yes Historical Provider, MD  CINNAMON PO Take 1 tablet by mouth every morning.   Yes Historical Provider, MD  cyclobenzaprine (FLEXERIL) 10 MG tablet Take 10-20 mg by mouth 2 (two) times daily. 10mg  in the morning and 20mg  at bedtime   Yes Historical Provider, MD  esomeprazole (NEXIUM) 40 MG capsule Take 40 mg by mouth every morning.   Yes Historical Provider, MD  GABAPENTIN PO Take 1 tablet by mouth 3 (three) times daily.   Yes Historical Provider, MD  hydrochlorothiazide (HYDRODIURIL) 25 MG tablet Take 25 mg by mouth daily.   Yes Historical Provider, MD  insulin aspart (NOVOLOG FLEXPEN) 100 UNIT/ML FlexPen Inject 30-45 Units into the skin 3 (three) times daily with meals.   Yes Historical Provider, MD  insulin detemir (LEVEMIR) 100 UNIT/ML injection Inject 10 Units into the skin at bedtime. Increase dose by 4 units every night for blood sugar greater than 200 in the morning Patient taking differently: Inject 45 Units into the skin 2 (two) times daily.  02/09/12  Yes Bonnielee Haff, MD  metoprolol (LOPRESSOR) 100 MG tablet Take 100 mg by mouth 2 (two) times daily.   Yes Historical Provider, MD  Probiotic Product (TRUBIOTICS PO) Take 1 tablet by mouth every morning.   Yes Historical Provider, MD  Pyridoxine HCl (B-6 PO) Take 1 tablet by mouth every morning.  Yes Historical Provider, MD  amLODipine (NORVASC) 10 MG tablet Take 10 mg by mouth daily.    Historical Provider, MD  clopidogrel (PLAVIX) 75 MG tablet Take 75 mg by mouth daily.    Historical Provider, MD  fentaNYL (DURAGESIC - DOSED MCG/HR) 50 MCG/HR Place 1 patch onto the skin every 3 (three) days.    Historical Provider, MD  metoCLOPramide (REGLAN) 5 MG tablet Take 1 tablet (5 mg total) by mouth 4 (four) times daily -  before meals and at bedtime. Patient not taking: Reported on 07/06/2014 02/09/12 02/19/12  Bonnielee Haff, MD  metoprolol (LOPRESSOR) 50 MG tablet Take 50 mg by mouth 2 (two) times daily.    Historical Provider, MD   nystatin cream (MYCOSTATIN) Apply 1 application topically 3 (three) times daily.    Historical Provider, MD  pantoprazole (PROTONIX) 40 MG tablet Take 40 mg by mouth daily.    Historical Provider, MD  polyethylene glycol (MIRALAX / GLYCOLAX) packet Take 17 g by mouth daily.    Historical Provider, MD  pravastatin (PRAVACHOL) 20 MG tablet Take 20 mg by mouth at bedtime.     Historical Provider, MD   Physical Exam: Filed Vitals:   07/06/14 0700 07/06/14 0730 07/06/14 0800 07/06/14 0810  BP: 145/69 141/96  168/100  Pulse: 117 118 120 117  Temp:      TempSrc:      Resp:   20 18  Height:      Weight:      SpO2: 97% 96% 97% 97%     General:  Awake, in nad  Eyes: PERRL B  ENT: membranes moist, dentition fair  Neck: trachea midline, neck supple  Cardiovascular: tachycardic, s1, s2  Respiratory: normal resp effort, no wheezing  Abdomen: soft,nondistended  Skin: perfused, no clubbing, erythematous, tender patch over LLE  Musculoskeletal: perfused, no clubbing  Psychiatric: mood/affect normal// no auditory/visual hallucinations  Neurologic: cn2-12 grossly intact, strength/sensation intact  Labs on Admission:  Basic Metabolic Panel:  Recent Labs Lab 07/05/14 2337  NA 125*  K 4.2  CL 84*  CO2 21  GLUCOSE 529*  BUN 24*  CREATININE 1.27  CALCIUM 9.5   Liver Function Tests:  Recent Labs Lab 07/05/14 2337  AST 16  ALT 23  ALKPHOS 73  BILITOT 0.4  PROT 8.0  ALBUMIN 3.5   No results for input(s): LIPASE, AMYLASE in the last 168 hours. No results for input(s): AMMONIA in the last 168 hours. CBC:  Recent Labs Lab 07/05/14 2337  WBC 10.5  NEUTROABS 7.8*  HGB 15.3  HCT 43.8  MCV 91.3  PLT 245   Cardiac Enzymes: No results for input(s): CKTOTAL, CKMB, CKMBINDEX, TROPONINI in the last 168 hours.  BNP (last 3 results) No results for input(s): PROBNP in the last 8760 hours. CBG:  Recent Labs Lab 07/06/14 0455 07/06/14 0538 07/06/14 0653  GLUCAP  422* 401* 369*    Radiological Exams on Admission: No results found.  Assessment/Plan Principal Problem:   DKA (diabetic ketoacidoses) Active Problems:   Cellulitis of left lower extremity   Tachycardia with 100 - 120 beats per minute   GERD (gastroesophageal reflux disease)   Essential hypertension   Hyponatremia   CAD (coronary artery disease), native coronary artery  1. DKA 1. Cont on insulin gtt with IVF 2. Monitor lytes 3. When GAP closes, would transition to subQ insulin 4. Admit to stepdown 2. LLE cellulitis without sepsis 1. Started on clindamycin and zosyn added 2. Will continue 3. Tachycardia 1. Likely  secondary to dehydration from DKA per above 2. IVF as tolerated 4. GERd 1. Cont PPI  5. HTN 1. Stable 2. Cont home meds, monitor for now 6. Hyponatremia 1. Secondary to elevated glucose from DKA 2. Tx per above 3. Follow Na 7. CAD 1. No chest pains 2. Stable 3. Cont home meds 8. DVT prophylaixis 1. DVT prophylaixis  Code Status: Full  Family Communication: Pt in room Disposition Plan: Pending  Time spent: 30min  [facility], [facility] Pager [phone]  If 7PM-7AM, please contact night-coverage [email or web address] Password [email or web address] 07/06/2014, 8:25 AM

## 2014-07-06 NOTE — ED Provider Notes (Signed)
CSN: [id number]     Arrival date & time 07/05/14  2254 History   First MD Initiated Contact with Patient 07/06/14 [phone]     Chief Complaint  Patient presents with  . Leg Pain      HPI Pt was seen at 0445. Per pt, c/o gradual onset and worsening of persistent "red rash" to his left lower leg for the past several days. Pt also states he has not taken his insulin in 2 days because he "slept all day yesterday." Has been associated with subjective home fevers/chills. Denies injury, no other areas of rash, no N/V/D, no abd pain, no CP/SOB.    Past Medical History  Diagnosis Date  . Coronary artery disease   . Hypertension 1998  . Diabetes mellitus 1995  . Fibromyalgia   . Anxiety   . Cellulitis   . Concussion   . Acute renal failure 01/2012   Past Surgical History  Procedure Laterality Date  . Cardiac catheterization    . Fracture surgery    . Hip fracture surgery     Family History  Problem Relation Age of Onset  . Hypertension Mother   . Dementia Father   . Alzheimer's disease Father    History  Substance Use Topics  . Smoking status: Never Smoker   . Smokeless tobacco: Not on file  . Alcohol Use: 0.6 oz/week    1 Glasses of wine per week     Comment: rarely    Review of Systems ROS: Statement: All systems negative except as marked or noted in the HPI; Constitutional: +subjective fever/chills. ; ; Eyes: Negative for eye pain, redness and discharge. ; ; ENMT: Negative for ear pain, hoarseness, nasal congestion, sinus pressure and sore throat. ; ; Cardiovascular: Negative for chest pain, palpitations, diaphoresis, dyspnea and peripheral edema. ; ; Respiratory: Negative for cough, wheezing and stridor. ; ; Gastrointestinal: Negative for nausea, vomiting, diarrhea, abdominal pain, blood in stool, hematemesis, jaundice and rectal bleeding. . ; ; Genitourinary: Negative for dysuria, flank pain and hematuria. ; ; Musculoskeletal: Negative for back pain and neck pain. Negative for  swelling and trauma.; ; Skin: +rash. Negative for pruritus, abrasions, blisters, bruising and skin lesion.; ; Neuro: Negative for headache, lightheadedness and neck stiffness. Negative for weakness, altered level of consciousness , altered mental status, extremity weakness, paresthesias, involuntary movement, seizure and syncope.      Allergies  Bactrim and Ibuprofen  Home Medications   Prior to Admission medications   Medication Sig Start Date End Date Taking? Authorizing Provider  aspirin EC 81 MG tablet Take 81 mg by mouth daily.   Yes Historical Provider, MD  CINNAMON PO Take 1 tablet by mouth every morning.   Yes Historical Provider, MD  cyclobenzaprine (FLEXERIL) 10 MG tablet Take 10-20 mg by mouth 2 (two) times daily. 10mg  in the morning and 20mg  at bedtime   Yes Historical Provider, MD  esomeprazole (NEXIUM) 40 MG capsule Take 40 mg by mouth every morning.   Yes Historical Provider, MD  GABAPENTIN PO Take 1 tablet by mouth 3 (three) times daily.   Yes Historical Provider, MD  hydrochlorothiazide (HYDRODIURIL) 25 MG tablet Take 25 mg by mouth daily.   Yes Historical Provider, MD  insulin aspart (NOVOLOG FLEXPEN) 100 UNIT/ML FlexPen Inject 30-45 Units into the skin 3 (three) times daily with meals.   Yes Historical Provider, MD  insulin detemir (LEVEMIR) 100 UNIT/ML injection Inject 10 Units into the skin at bedtime. Increase dose by 4 units every  night for blood sugar greater than 200 in the morning Patient taking differently: Inject 45 Units into the skin 2 (two) times daily.  02/09/12  Yes Bonnielee Haff, MD  metoprolol (LOPRESSOR) 100 MG tablet Take 100 mg by mouth 2 (two) times daily.   Yes Historical Provider, MD  Probiotic Product (TRUBIOTICS PO) Take 1 tablet by mouth every morning.   Yes Historical Provider, MD  Pyridoxine HCl (B-6 PO) Take 1 tablet by mouth every morning.   Yes Historical Provider, MD  amLODipine (NORVASC) 10 MG tablet Take 10 mg by mouth daily.    Historical  Provider, MD  clopidogrel (PLAVIX) 75 MG tablet Take 75 mg by mouth daily.    Historical Provider, MD  fentaNYL (DURAGESIC - DOSED MCG/HR) 50 MCG/HR Place 1 patch onto the skin every 3 (three) days.    Historical Provider, MD  metoCLOPramide (REGLAN) 5 MG tablet Take 1 tablet (5 mg total) by mouth 4 (four) times daily -  before meals and at bedtime. Patient not taking: Reported on 07/06/2014 02/09/12 02/19/12  Bonnielee Haff, MD  metoprolol (LOPRESSOR) 50 MG tablet Take 50 mg by mouth 2 (two) times daily.    Historical Provider, MD  nystatin cream (MYCOSTATIN) Apply 1 application topically 3 (three) times daily.    Historical Provider, MD  pantoprazole (PROTONIX) 40 MG tablet Take 40 mg by mouth daily.    Historical Provider, MD  polyethylene glycol (MIRALAX / GLYCOLAX) packet Take 17 g by mouth daily.    Historical Provider, MD  pravastatin (PRAVACHOL) 20 MG tablet Take 20 mg by mouth at bedtime.     Historical Provider, MD   BP 132/86 mmHg  Pulse 121  Temp(Src) 98.1 F (36.7 C) (Oral)  Resp 20  Ht 6\' 5"  (1.956 m)  Wt 370 lb (167.831 kg)  BMI 43.87 kg/m2  SpO2 98% Physical Exam  0450: Physical examination:  Nursing notes reviewed; Vital signs and O2 SAT reviewed;  Constitutional: Well developed, Well nourished, Well hydrated, In no acute distress; Head:  Normocephalic, atraumatic; Eyes: EOMI, PERRL, No scleral icterus; ENMT: Mouth and pharynx normal, Mucous membranes moist; Neck: Supple, Full range of motion, No lymphadenopathy; Cardiovascular: Tachycardic rate and rhythm, No gallop; Respiratory: Breath sounds clear & equal bilaterally, No rales, rhonchi, wheezes.  Speaking full sentences with ease, Normal respiratory effort/excursion; Chest: Nontender, Movement normal; Abdomen: Soft, Nontender, Nondistended, Normal bowel sounds; Genitourinary: No CVA tenderness; Extremities: Pulses normal, +left anterior, medial and lateral lower leg erythematous. No open wounds. No tenderness, No edema, No  calf edema or asymmetry.; Neuro: AA&Ox3, Major CN grossly intact.  Speech clear. No gross focal motor or sensory deficits in extremities.; Skin: Color normal, Warm, Dry.   ED Course  Procedures   MDM  MDM Reviewed: previous chart, vitals and nursing note Reviewed previous: labs Interpretation: labs Total time providing critical care: 30-74 minutes. This excludes time spent performing separately reportable procedures and services. Consults: admitting MD     CRITICAL CARE Performed by: Alfonzo Feller Total critical care time: 35 Critical care time was exclusive of separately billable procedures and treating other patients. Critical care was necessary to treat or prevent imminent or life-threatening deterioration. Critical care was time spent personally by me on the following activities: development of treatment plan with patient and/or surrogate as well as nursing, discussions with consultants, evaluation of patient's response to treatment, examination of patient, obtaining history from patient or surrogate, ordering and performing treatments and interventions, ordering and review of laboratory studies, ordering and  review of radiographic studies, pulse oximetry and re-evaluation of patient's condition.   Results for orders placed or performed during the hospital encounter of 07/06/14  CBC with Differential  Result Value Ref Range   WBC 10.5 4.0 - 10.5 K/uL   RBC 4.80 4.22 - 5.81 MIL/uL   Hemoglobin 15.3 13.0 - 17.0 g/dL   HCT 43.8 39.0 - 52.0 %   MCV 91.3 78.0 - 100.0 fL   MCH 31.9 26.0 - 34.0 pg   MCHC 34.9 30.0 - 36.0 g/dL   RDW 13.1 11.5 - 15.5 %   Platelets 245 150 - 400 K/uL   Neutrophils Relative % 74 43 - 77 %   Neutro Abs 7.8 (H) 1.7 - 7.7 K/uL   Lymphocytes Relative 13 12 - 46 %   Lymphs Abs 1.4 0.7 - 4.0 K/uL   Monocytes Relative 12 3 - 12 %   Monocytes Absolute 1.2 (H) 0.1 - 1.0 K/uL   Eosinophils Relative 1 0 - 5 %   Eosinophils Absolute 0.1 0.0 - 0.7 K/uL    Basophils Relative 0 0 - 1 %   Basophils Absolute 0.0 0.0 - 0.1 K/uL  Comprehensive metabolic panel  Result Value Ref Range   Sodium 125 (L) 137 - 147 mEq/L   Potassium 4.2 3.7 - 5.3 mEq/L   Chloride 84 (L) 96 - 112 mEq/L   CO2 21 19 - 32 mEq/L   Glucose, Bld 529 (H) 70 - 99 mg/dL   BUN 24 (H) 6 - 23 mg/dL   Creatinine, Ser 1.27 0.50 - 1.35 mg/dL   Calcium 9.5 8.4 - 10.5 mg/dL   Total Protein 8.0 6.0 - 8.3 g/dL   Albumin 3.5 3.5 - 5.2 g/dL   AST 16 0 - 37 U/L   ALT 23 0 - 53 U/L   Alkaline Phosphatase 73 39 - 117 U/L   Total Bilirubin 0.4 0.3 - 1.2 mg/dL   GFR calc non Af Amer 65 (L) >90 mL/min   GFR calc Af Amer 76 (L) >90 mL/min   Anion gap 20 (H) 5 - 15    0510:   CBG elevated; will dose IVF NS 2L bolus and start IV insulin gtt. Na corrects to 132 for elevated glucose. Will dose IV clindamycin for cellulitis. Dx and testing d/w pt.  Questions answered.  Verb understanding, agreeable to admit.  T/C to [facility]. Posey Pronto, case discussed, including:  HPI, pertinent PM/SHx, VS/PE, dx testing, ED course and treatment:  Agreeable to admit, requests to write temporary orders, obtain stepdown bed to team [facility].     Francine Graven, DO 07/07/14 [phone]

## 2014-07-06 NOTE — Plan of Care (Signed)
Problem: Consults Goal: General Medical Patient Education See Patient Education Module for specific education.  Outcome: Completed/Met Date Met:  07/06/14     

## 2014-07-07 LAB — COMPREHENSIVE METABOLIC PANEL
ALT: 15 U/L (ref 0–53)
AST: 12 U/L (ref 0–37)
Albumin: 2.7 g/dL — ABNORMAL LOW (ref 3.5–5.2)
Alkaline Phosphatase: 54 U/L (ref 39–117)
Anion gap: 11 (ref 5–15)
BILIRUBIN TOTAL: 0.4 mg/dL (ref 0.3–1.2)
BUN: 15 mg/dL (ref 6–23)
CALCIUM: 8.9 mg/dL (ref 8.4–10.5)
CO2: 26 meq/L (ref 19–32)
Chloride: 100 mEq/L (ref 96–112)
Creatinine, Ser: 1.07 mg/dL (ref 0.50–1.35)
GFR, EST NON AFRICAN AMERICAN: 80 mL/min — AB (ref >90)
GLUCOSE: 142 mg/dL — AB (ref 70–99)
Potassium: 3.5 mEq/L — ABNORMAL LOW (ref 3.7–5.3)
Sodium: 137 mEq/L (ref 137–147)
Total Protein: 6.4 g/dL (ref 6.0–8.3)

## 2014-07-07 LAB — CBC
HCT: 37.8 % — ABNORMAL LOW (ref 39.0–52.0)
Hemoglobin: 12.7 g/dL — ABNORMAL LOW (ref 13.0–17.0)
MCH: 31.1 pg (ref 26.0–34.0)
MCHC: 33.6 g/dL (ref 30.0–36.0)
MCV: 92.4 fL (ref 78.0–100.0)
PLATELETS: 225 10*3/uL (ref 150–400)
RBC: 4.09 MIL/uL — AB (ref 4.22–5.81)
RDW: 13.4 % (ref 11.5–15.5)
WBC: 6.6 10*3/uL (ref 4.0–10.5)

## 2014-07-07 LAB — GLUCOSE, CAPILLARY
GLUCOSE-CAPILLARY: 165 mg/dL — AB (ref 70–99)
GLUCOSE-CAPILLARY: 146 mg/dL — AB (ref 70–99)
GLUCOSE-CAPILLARY: 146 mg/dL — AB (ref 70–99)
GLUCOSE-CAPILLARY: 142 mg/dL — AB (ref 70–99)
GLUCOSE-CAPILLARY: 130 mg/dL — AB (ref 70–99)
Glucose-Capillary: 141 mg/dL — ABNORMAL HIGH (ref 70–99)
Glucose-Capillary: 185 mg/dL — ABNORMAL HIGH (ref 70–99)
Glucose-Capillary: 261 mg/dL — ABNORMAL HIGH (ref 70–99)
Glucose-Capillary: 156 mg/dL — ABNORMAL HIGH (ref 70–99)
Glucose-Capillary: 165 mg/dL — ABNORMAL HIGH (ref 70–99)
Glucose-Capillary: 159 mg/dL — ABNORMAL HIGH (ref 70–99)
Glucose-Capillary: 170 mg/dL — ABNORMAL HIGH (ref 70–99)

## 2014-07-07 MED ORDER — CYCLOBENZAPRINE HCL 10 MG PO TABS
10.0000 mg | ORAL_TABLET | Freq: Every day | ORAL | Status: DC
  Administered 2014-07-08 – 2014-07-11 (×4): 10 mg via ORAL
  Filled 2014-07-07 (×4): qty 1

## 2014-07-07 MED ORDER — INSULIN DETEMIR 100 UNIT/ML ~~LOC~~ SOLN
45.0000 [IU] | Freq: Every day | SUBCUTANEOUS | Status: DC
  Administered 2014-07-07: 45 [IU] via SUBCUTANEOUS
  Filled 2014-07-07: qty 0.45

## 2014-07-07 MED ORDER — CYCLOBENZAPRINE HCL 10 MG PO TABS
20.0000 mg | ORAL_TABLET | Freq: Every day | ORAL | Status: DC
  Administered 2014-07-07 – 2014-07-10 (×4): 20 mg via ORAL
  Filled 2014-07-07 (×6): qty 2

## 2014-07-07 MED ORDER — HYDROCHLOROTHIAZIDE 25 MG PO TABS
25.0000 mg | ORAL_TABLET | Freq: Every day | ORAL | Status: DC
  Administered 2014-07-07 – 2014-07-11 (×5): 25 mg via ORAL
  Filled 2014-07-07 (×5): qty 1

## 2014-07-07 MED ORDER — OXYCODONE HCL 5 MG PO TABS
5.0000 mg | ORAL_TABLET | ORAL | Status: DC | PRN
  Administered 2014-07-07 – 2014-07-09 (×8): 10 mg via ORAL
  Filled 2014-07-07 (×8): qty 2

## 2014-07-07 MED ORDER — SODIUM CHLORIDE 0.9 % IV SOLN
INTRAVENOUS | Status: DC
  Administered 2014-07-07 – 2014-07-09 (×2): via INTRAVENOUS

## 2014-07-07 MED ORDER — MORPHINE SULFATE 2 MG/ML IJ SOLN
2.0000 mg | INTRAMUSCULAR | Status: DC | PRN
  Administered 2014-07-07: 4 mg via INTRAVENOUS
  Administered 2014-07-08 – 2014-07-09 (×4): 2 mg via INTRAVENOUS
  Filled 2014-07-07: qty 2
  Filled 2014-07-07 (×4): qty 1

## 2014-07-07 MED ORDER — INSULIN ASPART 100 UNIT/ML ~~LOC~~ SOLN
30.0000 [IU] | Freq: Three times a day (TID) | SUBCUTANEOUS | Status: DC
  Administered 2014-07-07 (×2): 45 [IU] via SUBCUTANEOUS
  Administered 2014-07-08: 30 [IU] via SUBCUTANEOUS
  Administered 2014-07-08 – 2014-07-10 (×6): 45 [IU] via SUBCUTANEOUS
  Administered 2014-07-10: 30 [IU] via SUBCUTANEOUS
  Administered 2014-07-11: 45 [IU] via SUBCUTANEOUS

## 2014-07-07 MED ORDER — INSULIN DETEMIR 100 UNIT/ML ~~LOC~~ SOLN
45.0000 [IU] | Freq: Two times a day (BID) | SUBCUTANEOUS | Status: DC
  Administered 2014-07-07 – 2014-07-08 (×2): 45 [IU] via SUBCUTANEOUS
  Filled 2014-07-07 (×3): qty 0.45

## 2014-07-07 MED ORDER — GABAPENTIN 300 MG PO CAPS
300.0000 mg | ORAL_CAPSULE | Freq: Three times a day (TID) | ORAL | Status: DC
  Administered 2014-07-07 – 2014-07-11 (×11): 300 mg via ORAL
  Filled 2014-07-07 (×15): qty 1

## 2014-07-07 MED ORDER — POLYETHYLENE GLYCOL 3350 17 G PO PACK
17.0000 g | PACK | Freq: Every day | ORAL | Status: DC
  Administered 2014-07-07 – 2014-07-09 (×3): 17 g via ORAL
  Filled 2014-07-07 (×5): qty 1

## 2014-07-07 MED ORDER — POLYETHYLENE GLYCOL 3350 17 G PO PACK: 17.0000 g | PACK | Freq: Every day | ORAL | Status: DC

## 2014-07-07 NOTE — Progress Notes (Signed)
MD paged per pt request for more frequent pain meds. Kristopher Simon 4:33 PM

## 2014-07-07 NOTE — Progress Notes (Signed)
[facility]  Kristopher Simon [id number] DOB: 08/16/1966 DOA: 07/06/2014 PCP: Default, Provider, MD  Brief narrative: 48 year old male patient with known hypertension and diabetes who presented to the emergency department with left lower extremity swelling and pain.  Upon evaluation in the ER patient was noted to have recurrent left lower extremity cellulitis and was started on empiric antibiotics.  Of note patient had no leukocytosis or fevers on presentation. He was found to have elevated serum glucose of 529 with an elevated anion gap of 20. He was started on IV insulin drip.   HPI/Subjective:  Assessment/Plan:    DKA (diabetic ketoacidoses) Transition to [employer] custom NovoLog meal coverage as per home regimen-hemoglobin A1c in June was 7.8-suspect DKA result of acute infectious process    Cellulitis of left lower extremity Has significant angry red erythema of left lower extremity but patient endorsing improvement as compared to admission-continue empiric antibiotics    Tachycardia with 100 - 120 beats per minute Resolved-etiology dehydration, acute infection and DKA    Essential hypertension Continue home medications-current BP controlled   Dry eschar left lateral foot Patient reports onset greater than 3 months prior-currently no signs of infection-PCP has been managing-patient with chronic boney foot deformity since childhood    Hyponatremia Etiology from elevated glucose-resolved    GERD  Continue Protonix    CAD, native coronary artery No current symptoms  DVT prophylaxis: subcutaneous heparin Code Status: full Family Communication: no family at bedside Disposition Plan/Expected LOS: transfer to floor  Consultants:  none  Procedures: none  Cultures: MRSA PCR negative  Antibiotics: Clindamycin 11/15 > Zosyn 11/15 >  Objective: Blood pressure 169/90, pulse 87, temperature 97.8 F (36.6 C), temperature source  Oral, resp. rate 14, height 6\' 5"  (1.956 m), weight 373 lb 0.3 oz (169.2 kg), SpO2 96 %.  Intake/Output Summary (Last 24 hours) at 07/07/14 1411 Last data filed at 07/07/14 1300  Gross per 24 hour  Intake 3708.63 ml  Output   1725 ml  Net 1983.63 ml   Exam: Gen: No acute respiratory distress Chest: Clear to auscultation bilaterally without wheezes, rhonchi or crackles Cardiac: Regular rate and rhythm, no rubs murmurs or gallops Abdomen: Soft nontender nondistended without obvious hepatosplenomegaly, no ascites - morbidly obese  Extremities: marked circumferential angry erythema left lower extremity; erythema has decreased based on previously outlined ink demarcation-note dry eschar left lateral foot without sponginess, drainage or erythema - 1+ edema B LE   Scheduled Meds:  Scheduled Meds: . amLODipine  10 mg Oral Daily  . aspirin EC  81 mg Oral Daily  . clindamycin (CLEOCIN) IV  300 mg Intravenous 3 times per day  . clopidogrel  75 mg Oral Daily  . fentaNYL  50 mcg Transdermal Q72H  . gabapentin  100 mg Oral TID  . heparin  5,000 Units Subcutaneous 3 times per day  . insulin aspart  30-45 Units Subcutaneous TID WC  . insulin detemir  45 Units Subcutaneous Daily  . metoprolol  100 mg Oral BID  . pantoprazole  40 mg Oral Daily  . piperacillin-tazobactam (ZOSYN)  IV  3.375 g Intravenous 3 times per day  . pravastatin  20 mg Oral QHS  . saccharomyces boulardii  250 mg Oral BID  . sodium chloride  3 mL Intravenous Q12H   Data Reviewed: Basic Metabolic Panel:  Recent Labs Lab 07/06/14 0828 07/06/14 1246 07/06/14 1435 07/06/14 1708 07/07/14 0235  NA 132* 132* 134* 131* 137  K 4.2 3.9 4.3 3.8 3.5*  CL 90* 95* 96 94* 100  CO2 21 21 24 21 26   GLUCOSE 342* 251* 209* 227* 142*  BUN 21 18 18 18 15   CREATININE 1.15 1.01 1.01 0.86 1.07  CALCIUM 8.8 8.7 8.7 8.9 8.9   Liver Function Tests:  Recent Labs Lab 07/05/14 2337 07/07/14 0235  AST 16 12  ALT 23 15  ALKPHOS 73 54    BILITOT 0.4 0.4  PROT 8.0 6.4  ALBUMIN 3.5 2.7*   CBC:  Recent Labs Lab 07/05/14 2337 07/06/14 0828 07/07/14 0235  WBC 10.5 8.7 6.6  NEUTROABS 7.8*  --   --   HGB 15.3 13.4 12.7*  HCT 43.8 39.9 37.8*  MCV 91.3 92.8 92.4  PLT 245 215 225   CBG:  Recent Labs Lab 07/07/14 0636 07/07/14 0742 07/07/14 0848 07/07/14 0951 07/07/14 1222  GLUCAP 156* 142* 130* 165* 159*    Recent Results (from the past 240 hour(s))  MRSA PCR Screening     Status: None   Collection Time: 07/06/14  2:09 PM  Result Value Ref Range Status   MRSA by PCR NEGATIVE NEGATIVE Final    Comment:        The GeneXpert MRSA Assay (FDA approved for NASAL specimens only), is one component of a comprehensive MRSA colonization surveillance program. It is not intended to diagnose MRSA infection nor to guide or monitor treatment for MRSA infections.      Studies:  Recent x-ray studies have been reviewed in detail by the Attending Physician  Time spent :  61mins   Allison Ellis, ANP [facility] Office  [phone] Pager [phone]  On-Call/Text Page:      [email or web address]      password [email or web address]  If 7PM-7AM, please contact night-coverage [email or web address] Password [email or web address] 07/07/2014, 2:11 PM   LOS: 1 day   I have personally examined this patient and reviewed the entire database. I have reviewed the above note, made any necessary editorial changes, and agree with its content.  Cherene Altes, MD [facility]

## 2014-07-07 NOTE — Progress Notes (Signed)
Utilization Review Completed.Donne Anon T11/16/2015

## 2014-07-07 NOTE — Progress Notes (Addendum)
Inpatient Diabetes Program Recommendations  AACE/ADA: New Consensus Statement on Inpatient Glycemic Control (2013)  Target Ranges:  Prepandial:   less than 140 mg/dL      Peak postprandial:   less than 180 mg/dL (1-2 hours)      Critically ill patients:  140 - 180 mg/dL   Reason for Visit: consult and DKA admission  Inpatient Diabetes Program Recommendations Correction (SSI): add Novolog Resistant Q 4 while NPO.    Note: This coordinator spoke with patient concerning DKA admission.  Pt states he does not take his insulin like he should and prior to admission he did not take it at all because he felt so bad with nausea and vomiting.  Discussed taking basal even when sick and may need to take Novolog just to "correct" his blood glucose even if not eating.  I am not sure he has a correction scale to use at home.  Something to consider at discharge.   Recommend ordering A1C to assess prehospital glucose control. Will follow. Thank you  Raoul Pitch BSN, RN,CDE Inpatient Diabetes Coordinator [phone] (team pager)

## 2014-07-07 NOTE — Progress Notes (Signed)
Kathline Magic NP notified of pt 4 target range cbg's, co2 and anion gap. Order obtained for levemir. Told to call back in 2 hours for sliding scale insulin orders. Will continue to monitor.

## 2014-07-08 LAB — GLUCOSE, CAPILLARY
GLUCOSE-CAPILLARY: 241 mg/dL — AB (ref 70–99)
GLUCOSE-CAPILLARY: 167 mg/dL — AB (ref 70–99)
Glucose-Capillary: 84 mg/dL (ref 70–99)
Glucose-Capillary: 201 mg/dL — ABNORMAL HIGH (ref 70–99)
Glucose-Capillary: 127 mg/dL — ABNORMAL HIGH (ref 70–99)

## 2014-07-08 LAB — CBC
HEMATOCRIT: 39.4 % (ref 39.0–52.0)
Hemoglobin: 12.9 g/dL — ABNORMAL LOW (ref 13.0–17.0)
MCH: 30.6 pg (ref 26.0–34.0)
MCHC: 32.7 g/dL (ref 30.0–36.0)
MCV: 93.6 fL (ref 78.0–100.0)
Platelets: 253 10*3/uL (ref 150–400)
RBC: 4.21 MIL/uL — AB (ref 4.22–5.81)
RDW: 13.5 % (ref 11.5–15.5)
WBC: 6.2 10*3/uL (ref 4.0–10.5)

## 2014-07-08 LAB — BASIC METABOLIC PANEL
Anion gap: 12 (ref 5–15)
BUN: 13 mg/dL (ref 6–23)
CO2: 26 meq/L (ref 19–32)
CREATININE: 1.04 mg/dL (ref 0.50–1.35)
Calcium: 9.1 mg/dL (ref 8.4–10.5)
Chloride: 96 mEq/L (ref 96–112)
GFR calc Af Amer: >90 mL/min (ref >90)
GFR calc non Af Amer: 83 mL/min — ABNORMAL LOW (ref >90)
Glucose, Bld: 260 mg/dL — ABNORMAL HIGH (ref 70–99)
Potassium: 5.1 mEq/L (ref 3.7–5.3)
Sodium: 134 mEq/L — ABNORMAL LOW (ref 137–147)

## 2014-07-08 MED ORDER — INSULIN DETEMIR 100 UNIT/ML ~~LOC~~ SOLN
45.0000 [IU] | Freq: Two times a day (BID) | SUBCUTANEOUS | Status: DC
  Administered 2014-07-08 – 2014-07-10 (×4): 45 [IU] via SUBCUTANEOUS
  Filled 2014-07-08 (×6): qty 0.45

## 2014-07-08 MED ORDER — DIPHENHYDRAMINE HCL 25 MG PO CAPS
25.0000 mg | ORAL_CAPSULE | ORAL | Status: DC | PRN
  Administered 2014-07-08: 25 mg via ORAL
  Filled 2014-07-08: qty 1

## 2014-07-08 NOTE — Progress Notes (Signed)
Progress Note  Kristopher Simon DOB: Oct 28, 1965 DOA: 07/06/2014 PCP: Default, Provider, MD  Brief narrative: 48 year old male patient with known hypertension and diabetes who presented to the emergency department with left lower extremity swelling and pain.  Upon evaluation in the ER patient was noted to have recurrent left lower extremity cellulitis and was started on empiric antibiotics.  Of note patient had no leukocytosis or fevers on presentation. He was found to have elevated serum glucose of 529 with an elevated anion gap of 20. He was started on IV insulin drip.   HPI/Subjective:  Assessment/Plan:    DKA (diabetic ketoacidoses)-resolved Continue Levemir and NovoLog  hemoglobin A1c in June was 7.8- suspect DKA result of acute infectious process    Cellulitis of left lower extremity Slowly improving Will need continuation of antibiotics Currently on Zosyn and clindamycin Developed renal failure with vancomycin Anticipate inpatient antibiotics for 2 more days and discharge with by mouth antibiotics    Tachycardia with 100 - 120 beats per minute Resolved-etiology dehydration, acute infection and DKA    Essential hypertension Continue home medications-current BP controlled   Dry eschar left lateral foot Patient reports onset greater than 3 months prior-currently no signs of infection-PCP has been managing-patient with chronic boney foot deformity since childhood    Hyponatremia Etiology from elevated glucose-resolved    GERD  Continue Protonix    CAD, native coronary artery No current symptoms  DVT prophylaxis: subcutaneous heparin Code Status: full Family Communication: no family at bedside Disposition Plan/Expected [id number] discharge tomorrow   Consultants:  none  Procedures: none  Cultures: MRSA PCR negative  Antibiotics: Clindamycin 11/15 > Zosyn 11/15 >  Objective: Blood pressure 120/64, pulse 81, temperature 97.7 F (36.5  C), temperature source Oral, resp. rate 18, height 6\' 5"  (1.956 m), weight 169 kg (372 lb 9.2 oz), SpO2 98 %.  Intake/Output Summary (Last 24 hours) at 07/08/14 1219 Last data filed at 07/08/14 0730  Gross per 24 hour  Intake   1130 ml  Output   2700 ml  Net  -1570 ml   Exam: Gen: No acute respiratory distress Chest: Clear to auscultation bilaterally without wheezes, rhonchi or crackles Cardiac: Regular rate and rhythm, no rubs murmurs or gallops Abdomen: Soft nontender nondistended without obvious hepatosplenomegaly, no ascites - morbidly obese  Extremities: marked circumferential angry erythema left lower extremity; erythema has decreased based on previously outlined ink demarcation-note dry eschar left lateral foot without sponginess, drainage or erythema - 1+ edema B LE   Scheduled Meds:  Scheduled Meds: . aspirin EC  81 mg Oral Daily  . clindamycin (CLEOCIN) IV  300 mg Intravenous 3 times per day  . clopidogrel  75 mg Oral Daily  . cyclobenzaprine  10 mg Oral Daily  . cyclobenzaprine  20 mg Oral QHS  . gabapentin  300 mg Oral TID  . heparin  5,000 Units Subcutaneous 3 times per day  . hydrochlorothiazide  25 mg Oral Daily  . insulin aspart  30-45 Units Subcutaneous TID WC  . insulin detemir  45 Units Subcutaneous BID  . metoprolol  100 mg Oral BID  . pantoprazole  40 mg Oral Daily  . piperacillin-tazobactam (ZOSYN)  IV  3.375 g Intravenous 3 times per day  . polyethylene glycol  17 g Oral Daily  . pravastatin  20 mg Oral QHS  . saccharomyces boulardii  250 mg Oral BID  . sodium chloride  3 mL Intravenous Q12H   Data Reviewed: Basic Metabolic Panel:  Recent Labs Lab 07/06/14 1246 07/06/14 1435 07/06/14 1708 07/07/14 0235 07/08/14 0454  NA 132* 134* 131* 137 134*  K 3.9 4.3 3.8 3.5* 5.1  CL 95* 96 94* 100 96  CO2 21 24 21 26 26   GLUCOSE 251* 209* 227* 142* 260*  BUN 18 18 18 15 13   CREATININE 1.01 1.01 0.86 1.07 1.04  CALCIUM 8.7 8.7 8.9 8.9 9.1   Liver  Function Tests:  Recent Labs Lab 07/05/14 2337 07/07/14 0235  AST 16 12  ALT 23 15  ALKPHOS 73 54  BILITOT 0.4 0.4  PROT 8.0 6.4  ALBUMIN 3.5 2.7*   CBC:  Recent Labs Lab 07/05/14 2337 07/06/14 0828 07/07/14 0235 07/08/14 0454  WBC 10.5 8.7 6.6 6.2  NEUTROABS 7.8*  --   --   --   HGB 15.3 13.4 12.7* 12.9*  HCT 43.8 39.9 37.8* 39.4  MCV 91.3 92.8 92.4 93.6  PLT 245 215 225 253   CBG:  Recent Labs Lab 07/07/14 1222 07/07/14 1709 07/07/14 2114 07/08/14 0610 07/08/14 1102  GLUCAP 159* 170* 84 241* 201*    Recent Results (from the past 240 hour(s))  MRSA PCR Screening     Status: None   Collection Time: 07/06/14  2:09 PM  Result Value Ref Range Status   MRSA by PCR NEGATIVE NEGATIVE Final    Comment:        The GeneXpert MRSA Assay (FDA approved for NASAL specimens only), is one component of a comprehensive MRSA colonization surveillance program. It is not intended to diagnose MRSA infection nor to guide or monitor treatment for MRSA infections.      Studies:  Recent x-ray studies have been reviewed in detail by the Attending Physician

## 2014-07-09 LAB — HEMOGLOBIN A1C
HEMOGLOBIN A1C: 12.3 % — AB (ref <5.7)
Mean Plasma Glucose: 306 mg/dL — ABNORMAL HIGH (ref <117)

## 2014-07-09 LAB — GLUCOSE, CAPILLARY
GLUCOSE-CAPILLARY: 113 mg/dL — AB (ref 70–99)
Glucose-Capillary: 235 mg/dL — ABNORMAL HIGH (ref 70–99)
Glucose-Capillary: 179 mg/dL — ABNORMAL HIGH (ref 70–99)
Glucose-Capillary: 228 mg/dL — ABNORMAL HIGH (ref 70–99)

## 2014-07-09 MED ORDER — SODIUM POLYSTYRENE SULFONATE 15 GM/60ML PO SUSP
30.0000 g | Freq: Once | ORAL | Status: AC
  Administered 2014-07-09: 30 g via ORAL
  Filled 2014-07-09: qty 120

## 2014-07-09 NOTE — Progress Notes (Signed)
Progress Note  Kristopher Simon [id number] DOB: 1966-05-22 DOA: 07/06/2014 PCP: Default, Provider, MD  Brief narrative: 48 year old male patient with known hypertension and diabetes who presented to the emergency department with left lower extremity swelling and pain.  Upon evaluation in the ER patient was noted to have recurrent left lower extremity cellulitis and was started on empiric antibiotics.  Of note patient had no leukocytosis or fevers on presentation. He was found to have elevated serum glucose of 529 with an elevated anion gap of 20. He was started on IV insulin drip.   HPI/Subjective: No complaints,  Assessment/Plan:    DKA (diabetic ketoacidoses)-resolved Continue Levemir and NovoLog  hemoglobin A1c in June was 7.8-repeat hemoglobin A1c suspect DKA result of acute infectious process    Cellulitis of left lower extremity Slowly improving Will need continuation of antibiotics Currently on Zosyn and clindamycin Developed renal failure with vancomycin Anticipate inpatient antibiotics for 1 more days and discharge with by mouth antibiotics    Tachycardia with 100 - 120 beats per minute Resolved-etiology dehydration, acute infection and DKA    Essential hypertension Continue home medications-current BP controlled   Dry eschar left lateral foot Patient reports onset greater than 3 months prior-currently no signs of infection-PCP has been managing-patient with chronic boney foot deformity since childhood    Hyponatremia Etiology from elevated glucose-resolved    GERD  Continue Protonix    CAD, native coronary artery No current symptoms  DVT prophylaxis: subcutaneous heparin Code Status: full Family Communication: no family at bedside Disposition Plan/Expected [id number] discharge tomorrow   Consultants:  none  Procedures: none  Cultures: MRSA PCR negative  Antibiotics: Clindamycin 11/15 > Zosyn 11/15 >  Objective: Blood pressure 124/73,  pulse 85, temperature 98.7 F (37.1 C), temperature source Oral, resp. rate 18, height 6\' 5"  (1.956 m), weight 169 kg (372 lb 9.2 oz), SpO2 98 %.  Intake/Output Summary (Last 24 hours) at 07/09/14 1343 Last data filed at 07/09/14 1230  Gross per 24 hour  Intake    720 ml  Output   3150 ml  Net  -2430 ml   Exam: Gen: No acute respiratory distress Chest: Clear to auscultation bilaterally without wheezes, rhonchi or crackles Cardiac: Regular rate and rhythm, no rubs murmurs or gallops Abdomen: Soft nontender nondistended without obvious hepatosplenomegaly, no ascites - morbidly obese  Extremities: marked circumferential angry erythema left lower extremity; erythema has decreased based on previously outlined ink demarcation-note dry eschar left lateral foot without sponginess, drainage or erythema - 1+ edema B LE   Scheduled Meds:  Scheduled Meds: . aspirin EC  81 mg Oral Daily  . clindamycin (CLEOCIN) IV  300 mg Intravenous 3 times per day  . clopidogrel  75 mg Oral Daily  . cyclobenzaprine  10 mg Oral Daily  . cyclobenzaprine  20 mg Oral QHS  . gabapentin  300 mg Oral TID  . heparin  5,000 Units Subcutaneous 3 times per day  . hydrochlorothiazide  25 mg Oral Daily  . insulin aspart  30-45 Units Subcutaneous TID WC  . insulin detemir  45 Units Subcutaneous BID  . metoprolol  100 mg Oral BID  . pantoprazole  40 mg Oral Daily  . piperacillin-tazobactam (ZOSYN)  IV  3.375 g Intravenous 3 times per day  . polyethylene glycol  17 g Oral Daily  . pravastatin  20 mg Oral QHS  . saccharomyces boulardii  250 mg Oral BID  . sodium chloride  3 mL Intravenous Q12H  Data Reviewed: Basic Metabolic Panel:  Recent Labs Lab 07/06/14 1246 07/06/14 1435 07/06/14 1708 07/07/14 0235 07/08/14 0454  NA 132* 134* 131* 137 134*  K 3.9 4.3 3.8 3.5* 5.1  CL 95* 96 94* 100 96  CO2 21 24 21 26 26   GLUCOSE 251* 209* 227* 142* 260*  BUN 18 18 18 15 13   CREATININE 1.01 1.01 0.86 1.07 1.04    CALCIUM 8.7 8.7 8.9 8.9 9.1   Liver Function Tests:  Recent Labs Lab 07/05/14 2337 07/07/14 0235  AST 16 12  ALT 23 15  ALKPHOS 73 54  BILITOT 0.4 0.4  PROT 8.0 6.4  ALBUMIN 3.5 2.7*   CBC:  Recent Labs Lab 07/05/14 2337 07/06/14 0828 07/07/14 0235 07/08/14 0454  WBC 10.5 8.7 6.6 6.2  NEUTROABS 7.8*  --   --   --   HGB 15.3 13.4 12.7* 12.9*  HCT 43.8 39.9 37.8* 39.4  MCV 91.3 92.8 92.4 93.6  PLT 245 215 225 253   CBG:  Recent Labs Lab 07/08/14 1102 07/08/14 1621 07/08/14 2118 07/09/14 0626 07/09/14 1104  GLUCAP 201* 127* 167* 235* 179*    Recent Results (from the past 240 hour(s))  MRSA PCR Screening     Status: None   Collection Time: 07/06/14  2:09 PM  Result Value Ref Range Status   MRSA by PCR NEGATIVE NEGATIVE Final    Comment:        The GeneXpert MRSA Assay (FDA approved for NASAL specimens only), is one component of a comprehensive MRSA colonization surveillance program. It is not intended to diagnose MRSA infection nor to guide or monitor treatment for MRSA infections.      Studies:  Recent x-ray studies have been reviewed in detail by the Attending Physician

## 2014-07-09 NOTE — Plan of Care (Signed)
Problem: Phase I Progression Outcomes Goal: OOB as tolerated unless otherwise ordered Outcome: Progressing     

## 2014-07-10 LAB — GLUCOSE, CAPILLARY
GLUCOSE-CAPILLARY: 330 mg/dL — AB (ref 70–99)
GLUCOSE-CAPILLARY: 272 mg/dL — AB (ref 70–99)
Glucose-Capillary: 202 mg/dL — ABNORMAL HIGH (ref 70–99)
Glucose-Capillary: 127 mg/dL — ABNORMAL HIGH (ref 70–99)
Glucose-Capillary: 202 mg/dL — ABNORMAL HIGH (ref 70–99)

## 2014-07-10 LAB — COMPREHENSIVE METABOLIC PANEL
ALT: 49 U/L (ref 0–53)
AST: 31 U/L (ref 0–37)
Albumin: 2.6 g/dL — ABNORMAL LOW (ref 3.5–5.2)
Alkaline Phosphatase: 84 U/L (ref 39–117)
Anion gap: 13 (ref 5–15)
BUN: 12 mg/dL (ref 6–23)
CALCIUM: 8.8 mg/dL (ref 8.4–10.5)
CO2: 26 mEq/L (ref 19–32)
Chloride: 97 mEq/L (ref 96–112)
Creatinine, Ser: 1.05 mg/dL (ref 0.50–1.35)
GFR calc Af Amer: >90 mL/min (ref >90)
GFR calc non Af Amer: 82 mL/min — ABNORMAL LOW (ref >90)
GLUCOSE: 312 mg/dL — AB (ref 70–99)
POTASSIUM: 4.2 meq/L (ref 3.7–5.3)
SODIUM: 136 meq/L — AB (ref 137–147)
TOTAL PROTEIN: 6.2 g/dL (ref 6.0–8.3)
Total Bilirubin: 0.4 mg/dL (ref 0.3–1.2)

## 2014-07-10 MED ORDER — INSULIN DETEMIR 100 UNIT/ML ~~LOC~~ SOLN
15.0000 [IU] | Freq: Once | SUBCUTANEOUS | Status: DC
  Filled 2014-07-10: qty 0.15

## 2014-07-10 MED ORDER — INSULIN DETEMIR 100 UNIT/ML ~~LOC~~ SOLN
55.0000 [IU] | Freq: Two times a day (BID) | SUBCUTANEOUS | Status: DC
  Filled 2014-07-10: qty 0.55

## 2014-07-10 MED ORDER — INSULIN DETEMIR 100 UNIT/ML ~~LOC~~ SOLN
55.0000 [IU] | Freq: Two times a day (BID) | SUBCUTANEOUS | Status: DC
  Filled 2014-07-10 (×2): qty 0.55

## 2014-07-10 MED ORDER — INSULIN DETEMIR 100 UNIT/ML ~~LOC~~ SOLN
45.0000 [IU] | Freq: Once | SUBCUTANEOUS | Status: DC
  Filled 2014-07-10: qty 0.45

## 2014-07-10 MED ORDER — INSULIN DETEMIR 100 UNIT/ML ~~LOC~~ SOLN
60.0000 [IU] | Freq: Two times a day (BID) | SUBCUTANEOUS | Status: DC
  Filled 2014-07-10: qty 0.6

## 2014-07-10 MED ORDER — INSULIN DETEMIR 100 UNIT/ML ~~LOC~~ SOLN
60.0000 [IU] | Freq: Two times a day (BID) | SUBCUTANEOUS | Status: DC
  Administered 2014-07-10 – 2014-07-11 (×3): 60 [IU] via SUBCUTANEOUS
  Filled 2014-07-10 (×4): qty 0.6

## 2014-07-10 NOTE — Progress Notes (Addendum)
Progress Note  Kristopher Simon [id number] DOB: Apr 10, 1966 DOA: 07/06/2014 PCP: Default, Provider, MD  Brief narrative: 48 year old male patient with known hypertension and diabetes who presented to the emergency department with left lower extremity swelling and pain.  Upon evaluation in the ER patient was noted to have recurrent left lower extremity cellulitis and was started on empiric antibiotics.  Of note patient had no leukocytosis or fevers on presentation. He was found to have elevated serum glucose of 529 with an elevated anion gap of 20. He was started on IV insulin drip.   HPI/Subjective:  Assessment/Plan:    DKA (diabetic ketoacidoses)-resolved Increase Levemir to 60 units, continue NovoLog, hemoglobin A1c 12.3  Significant increase since June Requests diabetes coordinator, to revisit his insulin dosing May be he needs  higher basal insulin    Cellulitis of left lower extremity Slowly improving, anticipate switching to by mouth antibiotics and discharging home tomorrow Continue Zosyn and clindamycin Developed renal failure with vancomycin      Tachycardia with 100 - 120 beats per minute improving Resolved-etiology dehydration, acute infection and DKA    Essential hypertension Continue home medications-current BP controlled   Dry eschar left lateral foot Patient reports onset greater than 3 months prior-currently no signs of infection-PCP has been managing-patient with chronic boney foot deformity since childhood    Hyponatremia resolved Etiology from elevated glucose-resolved    GERD  Continue Protonix    CAD, native coronary artery No current symptoms  DVT prophylaxis: subcutaneous heparin Code Status: full Family Communication: no family at bedside Disposition Plan/Expected [id number] discharge tomorrow   Consultants:  none  Procedures: none  Cultures: MRSA PCR negative  Antibiotics: Clindamycin 11/15 > Zosyn 11/15  >  Objective: Blood pressure 128/80, pulse 77, temperature 97.9 F (36.6 C), temperature source Oral, resp. rate 18, height 6\' 5"  (1.956 m), weight 169 kg (372 lb 9.2 oz), SpO2 99 %.  Intake/Output Summary (Last 24 hours) at 07/10/14 1104 Last data filed at 07/10/14 0458  Gross per 24 hour  Intake   1775 ml  Output   3150 ml  Net  -1375 ml   Exam: Gen: No acute respiratory distress Chest: Clear to auscultation bilaterally without wheezes, rhonchi or crackles Cardiac: Regular rate and rhythm, no rubs murmurs or gallops Abdomen: Soft nontender nondistended without obvious hepatosplenomegaly, no ascites - morbidly obese  Extremities: marked circumferential angry erythema left lower extremity; erythema has decreased based on previously outlined ink demarcation-note dry eschar left lateral foot without sponginess, drainage or erythema - 1+ edema B LE   Scheduled Meds:  Scheduled Meds: . aspirin EC  81 mg Oral Daily  . clindamycin (CLEOCIN) IV  300 mg Intravenous 3 times per day  . clopidogrel  75 mg Oral Daily  . cyclobenzaprine  10 mg Oral Daily  . cyclobenzaprine  20 mg Oral QHS  . gabapentin  300 mg Oral TID  . heparin  5,000 Units Subcutaneous 3 times per day  . hydrochlorothiazide  25 mg Oral Daily  . insulin aspart  30-45 Units Subcutaneous TID WC  . insulin detemir  55 Units Subcutaneous BID  . metoprolol  100 mg Oral BID  . pantoprazole  40 mg Oral Daily  . piperacillin-tazobactam (ZOSYN)  IV  3.375 g Intravenous 3 times per day  . polyethylene glycol  17 g Oral Daily  . pravastatin  20 mg Oral QHS  . saccharomyces boulardii  250 mg Oral BID  . sodium chloride  3 mL Intravenous  Q12H   Data Reviewed: Basic Metabolic Panel:  Recent Labs Lab 07/06/14 1435 07/06/14 1708 07/07/14 0235 07/08/14 0454 07/10/14 0422  NA 134* 131* 137 134* 136*  K 4.3 3.8 3.5* 5.1 4.2  CL 96 94* 100 96 97  CO2 24 21 26 26 26   GLUCOSE 209* 227* 142* 260* 312*  BUN 18 18 15 13 12    CREATININE 1.01 0.86 1.07 1.04 1.05  CALCIUM 8.7 8.9 8.9 9.1 8.8   Liver Function Tests:  Recent Labs Lab 07/05/14 2337 07/07/14 0235 07/10/14 0422  AST 16 12 31   ALT 23 15 49  ALKPHOS 73 54 84  BILITOT 0.4 0.4 0.4  PROT 8.0 6.4 6.2  ALBUMIN 3.5 2.7* 2.6*   CBC:  Recent Labs Lab 07/05/14 2337 07/06/14 0828 07/07/14 0235 07/08/14 0454  WBC 10.5 8.7 6.6 6.2  NEUTROABS 7.8*  --   --   --   HGB 15.3 13.4 12.7* 12.9*  HCT 43.8 39.9 37.8* 39.4  MCV 91.3 92.8 92.4 93.6  PLT 245 215 225 253   CBG:  Recent Labs Lab 07/09/14 1104 07/09/14 1605 07/09/14 2141 07/10/14 0624 07/10/14 0851  GLUCAP 179* 113* 228* 330* 272*    Recent Results (from the past 240 hour(s))  MRSA PCR Screening     Status: None   Collection Time: 07/06/14  2:09 PM  Result Value Ref Range Status   MRSA by PCR NEGATIVE NEGATIVE Final    Comment:        The GeneXpert MRSA Assay (FDA approved for NASAL specimens only), is one component of a comprehensive MRSA colonization surveillance program. It is not intended to diagnose MRSA infection nor to guide or monitor treatment for MRSA infections.      Studies:  Recent x-ray studies have been reviewed in detail by the Attending Physician

## 2014-07-10 NOTE — Progress Notes (Signed)
Inpatient Diabetes Program Recommendations  AACE/ADA: New Consensus Statement on Inpatient Glycemic Control (2013)  Target Ranges:  Prepandial:   less than 140 mg/dL      Peak postprandial:   less than 180 mg/dL (1-2 hours)      Critically ill patients:  140 - 180 mg/dL   Inpatient Diabetes Program Recommendations Insulin - Basal: Increase Levemir to 55 units BID Correction (SSI): add Resistant scale TID + HS scale Pt did not receive Novolog coverage last night which likely contributed to elevated fasting this morning. Will follow. Thank you  Raoul Pitch BSN, RN,CDE Inpatient Diabetes Coordinator [phone] (team pager)

## 2014-07-10 NOTE — Plan of Care (Signed)
Problem: Phase III Progression Outcomes Goal: Pain controlled on oral analgesia Outcome: Progressing     

## 2014-07-10 NOTE — Progress Notes (Signed)
Utilization review completed.  

## 2014-07-10 NOTE — Evaluation (Signed)
Physical Therapy Evaluation Patient Details Name: Kristopher Simon MRN: [id number] DOB: 1966/01/23 Today's Date: 07/10/2014   History of Present Illness  Patient is a 48 y/o male admitted with LLE cellulitis and DKA. In ER, pt was started on empiric antibiotics and IV insulin drip due to elevated serum glucose of 529. PMH of HTN, DM and  ARF.    Clinical Impression  Patient presents close to functional baseline and able to tolerate ambulating community distances while performing higher level balance challenges without difficulty or LOB. Tolerated stair negotiation safely. Pt has been ambulating hallways independently. Encourage daily ambulation to maintain strength. Pt does not require skilled therapy services. Discharge from therapy.    Follow Up Recommendations No PT follow up;Supervision - Intermittent    Equipment Recommendations  None recommended by PT    Recommendations for Other Services       Precautions / Restrictions Precautions Precautions: None Restrictions Weight Bearing Restrictions: No      Mobility  Bed Mobility Overal bed mobility: Modified Independent             General bed mobility comments: HOB elevated, use of rails.  Transfers Overall transfer level: Needs assistance Equipment used: None Transfers: Sit to/from Stand Sit to Stand: Independent            Ambulation/Gait Ambulation/Gait assistance: Modified independent (Device/Increase time) Ambulation Distance (Feet): 200 Feet Assistive device: None (IV pole at times) Gait Pattern/deviations: Step-through pattern;Decreased stride length     General Gait Details: Pt with steady gait and able to tolerate higher level balance challenges during ambulation without LOB.   Stairs Stairs: Yes Stairs assistance: Modified independent (Device/Increase time) Stair Management: One rail Right;Alternating pattern Number of Stairs: 5 General stair comments: Limited by lines.   Wheelchair Mobility     Modified Rankin (Stroke Patients Only)       Balance Overall balance assessment: Needs assistance Sitting-balance support: Feet supported;No upper extremity supported Sitting balance-Leahy Scale: Good Sitting balance - Comments: Able to reach outside BoS to donn socks sitting EOB without difficulty.    Standing balance support: During functional activity Standing balance-Leahy Scale: Good                               Pertinent Vitals/Pain Pain Assessment:  (Discomfort per pt report.)    Home Living Family/patient expects to be discharged to:: Private residence Living Arrangements: Alone   Type of Home: House Home Access: Ramped entrance     Home Layout: Laundry or work area in basement;Two level Home Equipment: Walker - 2 wheels;Wheelchair - manual      Prior Function Level of Independence: Independent               Hand Dominance        Extremity/Trunk Assessment   Upper Extremity Assessment: Overall WFL for tasks assessed           Lower Extremity Assessment: Overall WFL for tasks assessed         Communication   Communication: No difficulties  Cognition Arousal/Alertness: Awake/alert Behavior During Therapy: WFL for tasks assessed/performed Overall Cognitive Status: Within Functional Limits for tasks assessed                      General Comments General comments (skin integrity, edema, etc.): Erythema, redness and swelling LLE distal to knee. Improved from yesterday as seems to be decreasing in size inside borders.  Exercises        Assessment/Plan    PT Assessment Patent does not need any further PT services  PT Diagnosis Acute pain   PT Problem List    PT Treatment Interventions     PT Goals (Current goals can be found in the Care Plan section) Acute Rehab PT Goals PT Goal Formulation: All assessment and education complete, DC therapy    Frequency     Barriers to discharge        Co-evaluation                End of Session Equipment Utilized During Treatment: Gait belt Activity Tolerance: Patient tolerated treatment well Patient left: in bed;with call bell/phone within reach           Time: 1441-1459 PT Time Calculation (min) (ACUTE ONLY): 18 min   Charges:   PT Evaluation $Initial PT Evaluation Tier I: 1 Procedure PT Treatments $Gait Training: 8-22 mins   PT G CodesCandy Sledge A 07/10/2014, 3:19 PM  Candy Sledge, PT, DPT [phone]

## 2014-07-11 LAB — GLUCOSE, CAPILLARY
GLUCOSE-CAPILLARY: 268 mg/dL — AB (ref 70–99)
GLUCOSE-CAPILLARY: 139 mg/dL — AB (ref 70–99)

## 2014-07-11 MED ORDER — INSULIN DETEMIR 100 UNIT/ML ~~LOC~~ SOLN: 60.0000 [IU] | Freq: Two times a day (BID) | SUBCUTANEOUS | Status: DC

## 2014-07-11 MED ORDER — AMOXICILLIN-POT CLAVULANATE 875-125 MG PO TABS: 1.0000 | ORAL_TABLET | Freq: Two times a day (BID) | ORAL | Status: AC

## 2014-07-11 MED ORDER — SACCHAROMYCES BOULARDII 250 MG PO CAPS: 250.0000 mg | ORAL_CAPSULE | Freq: Two times a day (BID) | ORAL | Status: DC

## 2014-07-11 MED ORDER — DOXYCYCLINE HYCLATE 50 MG PO CAPS: 100.0000 mg | ORAL_CAPSULE | Freq: Two times a day (BID) | ORAL | Status: AC

## 2014-07-11 NOTE — Discharge Summary (Signed)
Physician Discharge Summary  Kristopher Simon MRN: [id number] DOB/AGE: [birth year] 48 y.o.  PCP: Default, Provider, MD   Admit date: 07/06/2014 Discharge date: 07/11/2014  Discharge Diagnoses:  :   DKA (diabetic ketoacidoses)   Cellulitis of left lower extremity   Tachycardia with 100 - 120 beats per minute   GERD (gastroesophageal reflux disease)   Essential hypertension   Hyponatremia   CAD (coronary artery disease), native coronary artery  Follow-up recommendations Follow-up with PCP in 5-7 days Follow-up CBC and BMP in one week     Medication List    STOP taking these medications        clopidogrel 75 MG tablet  Commonly known as:  PLAVIX     pantoprazole 40 MG tablet  Commonly known as:  PROTONIX     pravastatin 20 MG tablet  Commonly known as:  PRAVACHOL      TAKE these medications        amoxicillin-clavulanate 875-125 MG per tablet  Commonly known as:  AUGMENTIN  Take 1 tablet by mouth 2 (two) times daily.     aspirin EC 81 MG tablet  Take 81 mg by mouth daily.     B-6 PO  Take 1 tablet by mouth every morning.     CINNAMON PO  Take 1 tablet by mouth every morning.     cyclobenzaprine 10 MG tablet  Commonly known as:  FLEXERIL  Take 10-20 mg by mouth 2 (two) times daily. 33m in the morning and 226mat bedtime     doxycycline 50 MG capsule  Commonly known as:  VIBRAMYCIN  Take 2 capsules (100 mg total) by mouth 2 (two) times daily.     esomeprazole 40 MG capsule  Commonly known as:  NEXIUM  Take 40 mg by mouth every morning.     gabapentin 300 MG capsule  Commonly known as:  NEURONTIN  Take 300 mg by mouth 3 (three) times daily.     hydrochlorothiazide 25 MG tablet  Commonly known as:  HYDRODIURIL  Take 25 mg by mouth daily.     insulin detemir 100 UNIT/ML injection  Commonly known as:  LEVEMIR  Inject 0.6 mLs (60 Units total) into the skin 2 (two) times daily.     metoCLOPramide 5 MG tablet  Commonly known as:  REGLAN  Take 1  tablet (5 mg total) by mouth 4 (four) times daily -  before meals and at bedtime.     metoprolol 100 MG tablet  Commonly known as:  LOPRESSOR  Take 100 mg by mouth 2 (two) times daily.     metoprolol 50 MG tablet  Commonly known as:  LOPRESSOR  Take 50 mg by mouth 2 (two) times daily.     NOVOLOG FLEXPEN 100 UNIT/ML FlexPen  Generic drug:  insulin aspart  Inject 30-45 Units into the skin 3 (three) times daily with meals.     nystatin cream  Commonly known as:  MYCOSTATIN  Apply 1 application topically 3 (three) times daily.     saccharomyces boulardii 250 MG capsule  Commonly known as:  FLORASTOR  Take 1 capsule (250 mg total) by mouth 2 (two) times daily.     TRUBIOTICS PO  Take 1 tablet by mouth every morning.        Discharge Condition:  Disposition: 01-Home or Self Care   Consults:   Significant Diagnostic Studies: No results found.  Microbiology: Recent Results (from the past 240 hour(s))  MRSA PCR Screening  Status: None   Collection Time: 07/06/14  2:09 PM  Result Value Ref Range Status   MRSA by PCR NEGATIVE NEGATIVE Final    Comment:        The GeneXpert MRSA Assay (FDA approved for NASAL specimens only), is one component of a comprehensive MRSA colonization surveillance program. It is not intended to diagnose MRSA infection nor to guide or monitor treatment for MRSA infections.      Labs: Results for orders placed or performed during the hospital encounter of 07/06/14 (from the past 48 hour(s))  Glucose, capillary     Status: Abnormal   Collection Time: 07/09/14  4:05 PM  Result Value Ref Range   Glucose-Capillary 113 (H) 70 - 99 mg/dL   Comment 1 Notify RN    Comment 2 Documented in Chart   Glucose, capillary     Status: Abnormal   Collection Time: 07/09/14  9:41 PM  Result Value Ref Range   Glucose-Capillary 228 (H) 70 - 99 mg/dL  Comprehensive metabolic panel     Status: Abnormal   Collection Time: 07/10/14  4:22 AM  Result Value  Ref Range   Sodium 136 (L) 137 - 147 mEq/L   Potassium 4.2 3.7 - 5.3 mEq/L    Comment: DELTA CHECK NOTED NO VISIBLE HEMOLYSIS    Chloride 97 96 - 112 mEq/L   CO2 26 19 - 32 mEq/L   Glucose, Bld 312 (H) 70 - 99 mg/dL   BUN 12 6 - 23 mg/dL   Creatinine, Ser 1.05 0.50 - 1.35 mg/dL   Calcium 8.8 8.4 - 10.5 mg/dL   Total Protein 6.2 6.0 - 8.3 g/dL   Albumin 2.6 (L) 3.5 - 5.2 g/dL   AST 31 0 - 37 U/L   ALT 49 0 - 53 U/L   Alkaline Phosphatase 84 39 - 117 U/L   Total Bilirubin 0.4 0.3 - 1.2 mg/dL   GFR calc non Af Amer 82 (L) >90 mL/min   GFR calc Af Amer >90 >90 mL/min    Comment: (NOTE) The eGFR has been calculated using the CKD EPI equation. This calculation has not been validated in all clinical situations. eGFR's persistently <90 mL/min signify possible Chronic Kidney Disease.    Anion gap 13 5 - 15  Glucose, capillary     Status: Abnormal   Collection Time: 07/10/14  6:24 AM  Result Value Ref Range   Glucose-Capillary 330 (H) 70 - 99 mg/dL  Glucose, capillary     Status: Abnormal   Collection Time: 07/10/14  8:51 AM  Result Value Ref Range   Glucose-Capillary 272 (H) 70 - 99 mg/dL   Comment 1 Documented in Chart    Comment 2 Notify RN   Glucose, capillary     Status: Abnormal   Collection Time: 07/10/14 11:26 AM  Result Value Ref Range   Glucose-Capillary 202 (H) 70 - 99 mg/dL  Glucose, capillary     Status: Abnormal   Collection Time: 07/10/14  4:58 PM  Result Value Ref Range   Glucose-Capillary 127 (H) 70 - 99 mg/dL  Glucose, capillary     Status: Abnormal   Collection Time: 07/10/14  9:00 PM  Result Value Ref Range   Glucose-Capillary 202 (H) 70 - 99 mg/dL   Comment 1 Documented in Chart    Comment 2 Notify RN   Glucose, capillary     Status: Abnormal   Collection Time: 07/11/14  6:02 AM  Result Value Ref Range   Glucose-Capillary 268 (  H) 70 - 99 mg/dL   Comment 1 Documented in Chart    Comment 2 Notify RN      HPI :Brief narrative: 48 year old male  patient with known hypertension and diabetes who presented to the emergency department with left lower extremity swelling and pain.  Upon evaluation in the ER patient was noted to have recurrent left lower extremity cellulitis and was started on empiric antibiotics. Of note patient had no leukocytosis or fevers on presentation. He was found to have elevated serum glucose of 529 with an elevated anion gap of 20. He was started on IV insulin drip.   HOSPITAL COURSE:  DKA (diabetic ketoacidoses)-resolved Initially admitted for DKA, treated with IV insulin drip, subsequently has transitioned to subcutaneous Levemir Increase Levemir to 60 units, continue NovoLog, hemoglobin A1c 12.3  Significant increase since June Patient appears to be compliant with his Accu-Cheks    Cellulitis of left lower extremity Started on Zosyn and clindamycin Developed renal failure with vancomycin Switched to doxycycline and Augmentin for 10 more days    Tachycardia with 100 - 120 beats per minute improving Resolved-etiology dehydration, acute infection and DKA   Essential hypertension Continue home medications-current BP controlled  Dry eschar left lateral foot Patient reports onset greater than 3 months prior-currently no signs of infection-PCP has been managing-patient with chronic boney foot deformity since childhood   Hyponatremia resolved Etiology from elevated glucose-resolved   GERD  Continue Protonix   CAD, native coronary artery No current symptoms  DVT prophylaxis: subcutaneous heparin Code Status: full     Discharge Exam: * Blood pressure 145/67, pulse 74, temperature 98.5 F (36.9 C), temperature source Oral, resp. rate 20, height '6\' 5"'  (1.956 m), weight 169.827 kg (374 lb 6.4 oz), SpO2 98 %.  Gen: No acute respiratory distress Chest: Clear to auscultation bilaterally without wheezes, rhonchi or crackles Cardiac: Regular rate and rhythm, no rubs murmurs or gallops Abdomen:  Soft nontender nondistended without obvious hepatosplenomegaly, no ascites - morbidly obese  Extremities: marked circumferential angry erythema left lower extremity; erythema has decreased based on previously outlined ink demarcation-note dry eschar left lateral foot without sponginess, drainage or erythema - 1+ edema B LE        Discharge Instructions    Diet - low sodium heart healthy    Complete by:  As directed      Increase activity slowly    Complete by:  As directed              Signed: Davene Jobin 07/11/2014, 11:22 AM

## 2014-07-11 NOTE — Plan of Care (Signed)
Problem: Discharge Progression Outcomes Goal: Pain controlled with appropriate interventions Outcome: Adequate for Discharge

## 2014-07-11 NOTE — Plan of Care (Signed)
Problem: Phase III Progression Outcomes Goal: Pain controlled on oral analgesia Outcome: Completed/Met Date Met:  07/11/14 Goal: Activity at appropriate level-compared to baseline (UP IN CHAIR FOR HEMODIALYSIS)  Outcome: Completed/Met Date Met:  07/11/14 Goal: Voiding independently Outcome: Completed/Met Date Met:  07/11/14 Goal: Foley discontinued Outcome: Not Applicable Date Met:  70/14/10 Goal: Discharge plan remains appropriate-arrangements made Outcome: Completed/Met Date Met:  07/11/14

## 2014-07-28 DIAGNOSIS — N521 Erectile dysfunction due to diseases classified elsewhere: Secondary | ICD-10-CM | POA: Insufficient documentation

## 2014-10-10 DIAGNOSIS — R252 Cramp and spasm: Secondary | ICD-10-CM | POA: Insufficient documentation

## 2016-02-18 DIAGNOSIS — L84 Corns and callosities: Secondary | ICD-10-CM | POA: Insufficient documentation

## 2016-02-18 DIAGNOSIS — Q667 Congenital pes cavus, unspecified foot: Secondary | ICD-10-CM | POA: Insufficient documentation

## 2016-11-18 DIAGNOSIS — E1165 Type 2 diabetes mellitus with hyperglycemia: Secondary | ICD-10-CM

## 2016-11-18 DIAGNOSIS — L03119 Cellulitis of unspecified part of limb: Secondary | ICD-10-CM

## 2016-11-18 DIAGNOSIS — I10 Essential (primary) hypertension: Secondary | ICD-10-CM

## 2016-11-18 DIAGNOSIS — L03116 Cellulitis of left lower limb: Secondary | ICD-10-CM

## 2016-11-21 DIAGNOSIS — L03116 Cellulitis of left lower limb: Secondary | ICD-10-CM

## 2016-11-21 DIAGNOSIS — E11621 Type 2 diabetes mellitus with foot ulcer: Secondary | ICD-10-CM

## 2016-11-22 DIAGNOSIS — M86172 Other acute osteomyelitis, left ankle and foot: Secondary | ICD-10-CM

## 2016-11-22 DIAGNOSIS — E11621 Type 2 diabetes mellitus with foot ulcer: Secondary | ICD-10-CM

## 2016-11-22 DIAGNOSIS — L03116 Cellulitis of left lower limb: Secondary | ICD-10-CM

## 2016-11-23 DIAGNOSIS — E11621 Type 2 diabetes mellitus with foot ulcer: Secondary | ICD-10-CM

## 2016-11-24 ENCOUNTER — Encounter: Payer: Self-pay | Admitting: Sports Medicine

## 2016-12-01 ENCOUNTER — Ambulatory Visit (INDEPENDENT_AMBULATORY_CARE_PROVIDER_SITE_OTHER): Payer: Self-pay | Admitting: Sports Medicine

## 2016-12-01 ENCOUNTER — Encounter: Payer: Self-pay | Admitting: Sports Medicine

## 2016-12-01 VITALS — BP 153/89 | HR 79 | Temp 98.1°F | Resp 18

## 2016-12-01 DIAGNOSIS — E1142 Type 2 diabetes mellitus with diabetic polyneuropathy: Secondary | ICD-10-CM

## 2016-12-01 DIAGNOSIS — M79672 Pain in left foot: Secondary | ICD-10-CM

## 2016-12-01 DIAGNOSIS — E11621 Type 2 diabetes mellitus with foot ulcer: Secondary | ICD-10-CM

## 2016-12-01 DIAGNOSIS — L97422 Non-pressure chronic ulcer of left heel and midfoot with fat layer exposed: Secondary | ICD-10-CM

## 2016-12-01 NOTE — Progress Notes (Signed)
Subjective: Kristopher Simon is a 51 y.o. male patient seen in office for POV #1 s/p Left foot incision and drainage with removal of bone 5th met base (Date of surgery 11-22-16) on Invanz and going to wound care center weekly. Patient has a history of diabetes and a blood glucose level  today not recorded. Denies nausea/fever/vomiting/chills/night sweats/shortness of breath/pain. Patient has no other pedal complaints at this time.  Patient Active Problem List   Diagnosis Date Noted  . DKA (diabetic ketoacidoses) ([place]) 07/06/2014  . Cellulitis of left lower extremity 07/06/2014  . Tachycardia with 100 - 120 beats per minute 07/06/2014  . GERD (gastroesophageal reflux disease) 07/06/2014  . Essential hypertension 07/06/2014  . Hyponatremia 07/06/2014  . CAD (coronary artery disease), native coronary artery 07/06/2014  . Acute kidney injury ([place]) 02/05/2012  . Diabetes mellitus ([place]) 02/05/2012  . HTN (hypertension) 02/05/2012  . Nausea & vomiting 02/05/2012  . Shortness of breath 02/05/2012   Current Outpatient Prescriptions on File Prior to Visit  Medication Sig Dispense Refill  . aspirin EC 81 MG tablet Take 81 mg by mouth daily.    Marland Kitchen CINNAMON PO Take 1 tablet by mouth every morning.    . cyclobenzaprine (FLEXERIL) 10 MG tablet Take 10-20 mg by mouth 2 (two) times daily. 28m in the morning and 280mat bedtime    . esomeprazole (NEXIUM) 40 MG capsule Take 40 mg by mouth every morning.    . gabapentin (NEURONTIN) 300 MG capsule Take 300 mg by mouth 3 (three) times daily.    . hydrochlorothiazide (HYDRODIURIL) 25 MG tablet Take 25 mg by mouth daily.    . insulin aspart (NOVOLOG FLEXPEN) 100 UNIT/ML FlexPen Inject 30-45 Units into the skin 3 (three) times daily with meals.    . insulin detemir (LEVEMIR) 100 UNIT/ML injection Inject 0.6 mLs (60 Units total) into the skin 2 (two) times daily. 10 mL 11  . metoCLOPramide (REGLAN) 5 MG tablet Take 1 tablet (5 mg total) by mouth 4 (four) times daily -   before meals and at bedtime. (Patient not taking: Reported on 07/06/2014) 120 tablet 0  . metoprolol (LOPRESSOR) 100 MG tablet Take 100 mg by mouth 2 (two) times daily.    . metoprolol (LOPRESSOR) 50 MG tablet Take 50 mg by mouth 2 (two) times daily.    . Marland Kitchenystatin cream (MYCOSTATIN) Apply 1 application topically 3 (three) times daily.    . Probiotic Product (TRUBIOTICS PO) Take 1 tablet by mouth every morning.    . Pyridoxine HCl (B-6 PO) Take 1 tablet by mouth every morning.    . saccharomyces boulardii (FLORASTOR) 250 MG capsule Take 1 capsule (250 mg total) by mouth 2 (two) times daily. 30 capsule 0   No current facility-administered medications on file prior to visit.    Allergies  Allergen Reactions  . Bactrim [Sulfamethoxazole-Trimethoprim] Swelling    "tongue swelling"  . Ibuprofen     Kidney issues    No results found for this or any previous visit (from the past 2160 hour(s)).  Objective: There were no vitals filed for this visit.  General: Patient is awake, alert, oriented x 3 and in no acute distress.  Dermatology: Skin is warm and dry bilateral with a full thickness ulceration present 5th met base left foot. Ulceration measures 5cm x 2cm x 1cm. There is a macerated border with a granular base. The ulceration does not probe to bone. There is no malodor, no active drainage, decreased erythema, decreased edema. No acute  signs of infection.   Vascular: Dorsalis Pedis pulse = 1/4 Bilateral,  Posterior Tibial pulse = 1/4 Bilateral,  Capillary Fill Time < 5 seconds  Neurologic: Protective sensation diminished using  the 5.07/10g [employer] Weinstein Monofilament.  Musculosketal: There is decreased ankle joint range of motion Bilateral. There is decreased Subtalar joint range of motion Bilateral. There is a decrease in 1st metatarsophalangeal joint range of motion Bilateral. No Pain with palpation to ulcerated area. No pain with compression to calves bilateral. Pes cavus and varus  foot and ankle deformity L>R.   Assessment and Plan:  Problem List Items Addressed This Visit    None    Visit Diagnoses    Diabetic ulcer of left midfoot associated with type 2 diabetes mellitus, with fat layer exposed ([place])    -  Primary   Diabetic polyneuropathy associated with type 2 diabetes mellitus ([place])       Left foot pain         -Examined patient and discussed the progression of the wound and treatment alternatives. -Non-selectively debrided ulceration with saline moistened guaze  -Applied betadine peri-wound to macerated areas and gauze packing to wound base and dry sterile dressing and instructed patient to continue with dressing changes per recommendation of wound care center -[place] patient to go to the ER or return to office if the wound worsens or if constitutional symptoms are present. -Patient to return to office in 2 weeks for follow up POV wound care or sooner if problems arise.  Landis Martins, DPM

## 2016-12-01 NOTE — Progress Notes (Signed)
   Subjective:    Patient ID: Kristopher Simon, male    DOB: [birth year], 51 y.o.   MRN: [id number]  HPI   Dr Cannon Kettle did my surgery on my left foot and it does drain and started out as a callus and I did use a ped egg and I do go to the wound center and I am a diabetic and my feet get cold and numb and do have neuropathy    Review of Systems  All other systems reviewed and are negative.      Objective:   Physical Exam        Assessment & Plan:

## 2016-12-15 ENCOUNTER — Telehealth: Payer: Self-pay | Admitting: *Deleted

## 2016-12-15 ENCOUNTER — Encounter: Payer: Self-pay | Admitting: Sports Medicine

## 2016-12-15 ENCOUNTER — Ambulatory Visit (INDEPENDENT_AMBULATORY_CARE_PROVIDER_SITE_OTHER): Payer: Self-pay | Admitting: Sports Medicine

## 2016-12-15 DIAGNOSIS — L03119 Cellulitis of unspecified part of limb: Secondary | ICD-10-CM

## 2016-12-15 DIAGNOSIS — M79672 Pain in left foot: Secondary | ICD-10-CM

## 2016-12-15 DIAGNOSIS — I10 Essential (primary) hypertension: Secondary | ICD-10-CM

## 2016-12-15 DIAGNOSIS — L02619 Cutaneous abscess of unspecified foot: Secondary | ICD-10-CM

## 2016-12-15 DIAGNOSIS — E11621 Type 2 diabetes mellitus with foot ulcer: Secondary | ICD-10-CM

## 2016-12-15 DIAGNOSIS — E119 Type 2 diabetes mellitus without complications: Secondary | ICD-10-CM

## 2016-12-15 DIAGNOSIS — L97422 Non-pressure chronic ulcer of left heel and midfoot with fat layer exposed: Secondary | ICD-10-CM

## 2016-12-15 DIAGNOSIS — E1142 Type 2 diabetes mellitus with diabetic polyneuropathy: Secondary | ICD-10-CM

## 2016-12-15 NOTE — Progress Notes (Signed)
Subjective: Kristopher Simon is a 51 y.o. male patient seen in office for POV #2 s/p Left foot incision and drainage with removal of bone 5th met base (Date of surgery 11-22-16) was on Invanz which was completed 1 week ago and is going to wound care center weekly; last visit Monday. Patient has a history of diabetes and a blood glucose level  today not recorded. Admits nausea. Temp in office 102. Denies vomiting/chills/night sweats/shortness of breath/pain. Patient has no other pedal complaints at this time.  Patient Active Problem List   Diagnosis Date Noted  . DKA (diabetic ketoacidoses) ([place]) 07/06/2014  . Cellulitis of left lower extremity 07/06/2014  . Tachycardia with 100 - 120 beats per minute 07/06/2014  . GERD (gastroesophageal reflux disease) 07/06/2014  . Essential hypertension 07/06/2014  . Hyponatremia 07/06/2014  . CAD (coronary artery disease), native coronary artery 07/06/2014  . Acute kidney injury ([place]) 02/05/2012  . Diabetes mellitus ([place]) 02/05/2012  . HTN (hypertension) 02/05/2012  . Nausea & vomiting 02/05/2012  . Shortness of breath 02/05/2012   Current Outpatient Prescriptions on File Prior to Visit  Medication Sig Dispense Refill  . aspirin EC 81 MG tablet Take 81 mg by mouth daily.    Marland Kitchen CINNAMON PO Take 1 tablet by mouth every morning.    . cyclobenzaprine (FLEXERIL) 10 MG tablet Take 10-20 mg by mouth 2 (two) times daily. 15m in the morning and 276mat bedtime    . esomeprazole (NEXIUM) 40 MG capsule Take 40 mg by mouth every morning.    . gabapentin (NEURONTIN) 300 MG capsule Take 300 mg by mouth 3 (three) times daily.    . hydrochlorothiazide (HYDRODIURIL) 25 MG tablet Take 25 mg by mouth daily.    . insulin aspart (NOVOLOG FLEXPEN) 100 UNIT/ML FlexPen Inject 30-45 Units into the skin 3 (three) times daily with meals.    . insulin detemir (LEVEMIR) 100 UNIT/ML injection Inject 0.6 mLs (60 Units total) into the skin 2 (two) times daily. 10 mL 11  . metoCLOPramide  (REGLAN) 5 MG tablet Take 1 tablet (5 mg total) by mouth 4 (four) times daily -  before meals and at bedtime. (Patient not taking: Reported on 07/06/2014) 120 tablet 0  . metoprolol (LOPRESSOR) 100 MG tablet Take 100 mg by mouth 2 (two) times daily.    . metoprolol (LOPRESSOR) 50 MG tablet Take 50 mg by mouth 2 (two) times daily.    . Marland Kitchenystatin cream (MYCOSTATIN) Apply 1 application topically 3 (three) times daily.    . Probiotic Product (TRUBIOTICS PO) Take 1 tablet by mouth every morning.    . Pyridoxine HCl (B-6 PO) Take 1 tablet by mouth every morning.    . saccharomyces boulardii (FLORASTOR) 250 MG capsule Take 1 capsule (250 mg total) by mouth 2 (two) times daily. 30 capsule 0   No current facility-administered medications on file prior to visit.    Allergies  Allergen Reactions  . Bactrim [Sulfamethoxazole-Trimethoprim] Swelling    "tongue swelling"  . Ibuprofen     Kidney issues    No results found for this or any previous visit (from the past 2160 hour(s)).  Objective: There were no vitals filed for this visit.  General: Patient is awake, alert, oriented x 3 and in no acute distress.  Dermatology: Skin is warm and dry bilateral with a full thickness ulceration present 5th met base left foot. Ulceration measures 4cm x 2cm x 1cm. There is a macerated border with a fibro necrotic base. The ulceration  does not probe to bone. There is no malodor, + active drainage, entense erythema/cellulitis, + edema.   Vascular: Dorsalis Pedis pulse = 1/4 Bilateral,  Posterior Tibial pulse = 1/4 Bilateral,  Capillary Fill Time < 5 seconds  Neurologic: Protective sensation diminished using  the 5.07/10g Semmes Weinstein Monofilament.  Musculosketal: There is decreased ankle joint range of motion Bilateral. There is decreased Subtalar joint range of motion Bilateral. There is a decrease in 1st metatarsophalangeal joint range of motion Bilateral. No Pain with palpation to ulcerated area. No pain  with compression to calves bilateral. 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   Assessment and Plan:  Problem List Items Addressed This Visit    None    Visit Diagnoses    Cellulitis and abscess of foot, except toes    -  Primary   Diabetic ulcer of left midfoot associated with type 2 diabetes mellitus, with fat layer exposed ([place])       Diabetic polyneuropathy associated with type 2 diabetes mellitus ([place])       Left foot pain         -Examined patient and discussed the progression of the wound and treatment alternatives. -Cleansed ulceration and applied dry sterile dressing -Discussed case with hospitalist and directly admitted patient to hospital for cellulitis -Will follow patient while in-house   -Patient to return to office after discharge  Landis Martins, DPM

## 2016-12-15 NOTE — Telephone Encounter (Signed)
Dr. Dixie Dials states Dr. Cannon Kettle had called for information on direct admitting of pt currently in the [facility]. Dr. Dixie Dials stated have pt go to Admissions and Admissions would get pt admitted to a bed and he would handle from there. I informed Dr. Cannon Kettle and she states she will send pt.

## 2016-12-20 ENCOUNTER — Encounter: Payer: Self-pay | Admitting: Sports Medicine

## 2016-12-30 ENCOUNTER — Ambulatory Visit (INDEPENDENT_AMBULATORY_CARE_PROVIDER_SITE_OTHER): Payer: Self-pay | Admitting: Sports Medicine

## 2016-12-30 ENCOUNTER — Encounter: Payer: Self-pay | Admitting: Sports Medicine

## 2016-12-30 DIAGNOSIS — M79672 Pain in left foot: Secondary | ICD-10-CM

## 2016-12-30 DIAGNOSIS — L02619 Cutaneous abscess of unspecified foot: Secondary | ICD-10-CM

## 2016-12-30 DIAGNOSIS — E1142 Type 2 diabetes mellitus with diabetic polyneuropathy: Secondary | ICD-10-CM

## 2016-12-30 DIAGNOSIS — E11621 Type 2 diabetes mellitus with foot ulcer: Secondary | ICD-10-CM

## 2016-12-30 DIAGNOSIS — L97422 Non-pressure chronic ulcer of left heel and midfoot with fat layer exposed: Secondary | ICD-10-CM

## 2016-12-30 DIAGNOSIS — L03119 Cellulitis of unspecified part of limb: Secondary | ICD-10-CM

## 2016-12-30 NOTE — Progress Notes (Signed)
Subjective: Kristopher Simon is a 51 y.o. male patient seen in office for POV #3 s/p Left foot incision and drainage with removal of bone 5th met base (Date of surgery 11-22-16). Patient was readmitted for 4 days with Replacement of PICC line and restarted  Invanz x 6 -8 weeks. Denies vomiting/chills/night sweats/shortness of breath/pain. Patient has no other pedal complaints at this time.  Patient Active Problem List   Diagnosis Date Noted  . DKA (diabetic ketoacidoses) ([place]) 07/06/2014  . Cellulitis of left lower extremity 07/06/2014  . Tachycardia with 100 - 120 beats per minute 07/06/2014  . GERD (gastroesophageal reflux disease) 07/06/2014  . Essential hypertension 07/06/2014  . Hyponatremia 07/06/2014  . CAD (coronary artery disease), native coronary artery 07/06/2014  . Acute kidney injury ([place]) 02/05/2012  . Diabetes mellitus ([place]) 02/05/2012  . HTN (hypertension) 02/05/2012  . Nausea & vomiting 02/05/2012  . Shortness of breath 02/05/2012   Current Outpatient Prescriptions on File Prior to Visit  Medication Sig Dispense Refill  . aspirin EC 81 MG tablet Take 81 mg by mouth daily.    Marland Kitchen CINNAMON PO Take 1 tablet by mouth every morning.    . cyclobenzaprine (FLEXERIL) 10 MG tablet Take 10-20 mg by mouth 2 (two) times daily. 69m in the morning and 251mat bedtime    . esomeprazole (NEXIUM) 40 MG capsule Take 40 mg by mouth every morning.    . gabapentin (NEURONTIN) 300 MG capsule Take 300 mg by mouth 3 (three) times daily.    . hydrochlorothiazide (HYDRODIURIL) 25 MG tablet Take 25 mg by mouth daily.    . insulin aspart (NOVOLOG FLEXPEN) 100 UNIT/ML FlexPen Inject 30-45 Units into the skin 3 (three) times daily with meals.    . insulin detemir (LEVEMIR) 100 UNIT/ML injection Inject 0.6 mLs (60 Units total) into the skin 2 (two) times daily. 10 mL 11  . metoCLOPramide (REGLAN) 5 MG tablet Take 1 tablet (5 mg total) by mouth 4 (four) times daily -  before meals and at bedtime. (Patient  not taking: Reported on 07/06/2014) 120 tablet 0  . metoprolol (LOPRESSOR) 100 MG tablet Take 100 mg by mouth 2 (two) times daily.    . metoprolol (LOPRESSOR) 50 MG tablet Take 50 mg by mouth 2 (two) times daily.    . Marland Kitchenystatin cream (MYCOSTATIN) Apply 1 application topically 3 (three) times daily.    . Probiotic Product (TRUBIOTICS PO) Take 1 tablet by mouth every morning.    . Pyridoxine HCl (B-6 PO) Take 1 tablet by mouth every morning.    . saccharomyces boulardii (FLORASTOR) 250 MG capsule Take 1 capsule (250 mg total) by mouth 2 (two) times daily. 30 capsule 0   No current facility-administered medications on file prior to visit.    Allergies  Allergen Reactions  . Bactrim [Sulfamethoxazole-Trimethoprim] Swelling    "tongue swelling"  . Ibuprofen     Kidney issues    No results found for this or any previous visit (from the past 2160 hour(s)).  Objective: There were no vitals filed for this visit.  General: Patient is awake, alert, oriented x 3 and in no acute distress.  Dermatology: Skin is warm and dry bilateral with a full thickness ulceration present 5th met base left foot. Ulceration measures 5cm x 2cm x 1cm. There is a macerated border with a fibrotic base. The ulceration doesprobe to bone. There is no malodor, + active drainage, decreased erythema/cellulitis, + edema.   Vascular: Dorsalis Pedis pulse = 1/4 Bilateral,  Posterior Tibial pulse = 1/4 Bilateral,  Capillary Fill Time < 5 seconds  Neurologic: Protective sensation diminished using  the 5.07/10g [employer].  Musculosketal: There is decreased ankle joint range of motion Bilateral. There is decreased Subtalar joint range of motion Bilateral. There is a decrease in 1st metatarsophalangeal joint range of motion Bilateral. No Pain with palpation to ulcerated area. No pain with compression to calves bilateral. 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   Assessment and Plan:  Problem List  Items Addressed This Visit    None    Visit Diagnoses    Cellulitis and abscess of foot, except toes    -  Primary   Diabetic ulcer of left midfoot associated with type 2 diabetes mellitus, with fat layer exposed ([place])       Diabetic polyneuropathy associated with type 2 diabetes mellitus ([place])       Left foot pain         -Examined patient and discussed the progression of the wound and treatment alternatives. -Cleansed ulceration and applied dry sterile dressing with packing; will discuss with wound center possible vac -Continue with wound care nursing and IV antibiotics  -Patient to return to office in 2 weeks for continued post op wound care.   Landis Martins, DPM

## 2017-01-13 ENCOUNTER — Ambulatory Visit (INDEPENDENT_AMBULATORY_CARE_PROVIDER_SITE_OTHER): Payer: Self-pay | Admitting: Sports Medicine

## 2017-01-13 ENCOUNTER — Encounter: Payer: Self-pay | Admitting: Sports Medicine

## 2017-01-13 DIAGNOSIS — E1142 Type 2 diabetes mellitus with diabetic polyneuropathy: Secondary | ICD-10-CM

## 2017-01-13 DIAGNOSIS — L97422 Non-pressure chronic ulcer of left heel and midfoot with fat layer exposed: Secondary | ICD-10-CM

## 2017-01-13 DIAGNOSIS — E11621 Type 2 diabetes mellitus with foot ulcer: Secondary | ICD-10-CM

## 2017-01-13 DIAGNOSIS — M79672 Pain in left foot: Secondary | ICD-10-CM

## 2017-01-13 NOTE — Progress Notes (Signed)
Subjective: Kristopher Simon is a 51 y.o. male patient seen in office for POV #4 s/p Left foot incision and drainage with removal of bone 5th met base (Date of surgery 11-22-16). Patient is on PICC line abx to end 02-02-17. Denies vomiting/chills/night sweats/shortness of breath/pain. Patient has no other pedal complaints at this time.  Patient Active Problem List   Diagnosis Date Noted  . DKA (diabetic ketoacidoses) ([place]) 07/06/2014  . Cellulitis of left lower extremity 07/06/2014  . Tachycardia with 100 - 120 beats per minute 07/06/2014  . GERD (gastroesophageal reflux disease) 07/06/2014  . Essential hypertension 07/06/2014  . Hyponatremia 07/06/2014  . CAD (coronary artery disease), native coronary artery 07/06/2014  . Acute kidney injury ([place]) 02/05/2012  . Diabetes mellitus ([place]) 02/05/2012  . HTN (hypertension) 02/05/2012  . Nausea & vomiting 02/05/2012  . Shortness of breath 02/05/2012   Current Outpatient Prescriptions on File Prior to Visit  Medication Sig Dispense Refill  . aspirin EC 81 MG tablet Take 81 mg by mouth daily.    Marland Kitchen CINNAMON PO Take 1 tablet by mouth every morning.    . cyclobenzaprine (FLEXERIL) 10 MG tablet Take 10-20 mg by mouth 2 (two) times daily. 5m in the morning and 22mat bedtime    . esomeprazole (NEXIUM) 40 MG capsule Take 40 mg by mouth every morning.    . gabapentin (NEURONTIN) 300 MG capsule Take 300 mg by mouth 3 (three) times daily.    . hydrochlorothiazide (HYDRODIURIL) 25 MG tablet Take 25 mg by mouth daily.    . insulin aspart (NOVOLOG FLEXPEN) 100 UNIT/ML FlexPen Inject 30-45 Units into the skin 3 (three) times daily with meals.    . insulin detemir (LEVEMIR) 100 UNIT/ML injection Inject 0.6 mLs (60 Units total) into the skin 2 (two) times daily. 10 mL 11  . metoCLOPramide (REGLAN) 5 MG tablet Take 1 tablet (5 mg total) by mouth 4 (four) times daily -  before meals and at bedtime. (Patient not taking: Reported on 07/06/2014) 120 tablet 0  .  metoprolol (LOPRESSOR) 100 MG tablet Take 100 mg by mouth 2 (two) times daily.    . metoprolol (LOPRESSOR) 50 MG tablet Take 50 mg by mouth 2 (two) times daily.    . Marland Kitchenystatin cream (MYCOSTATIN) Apply 1 application topically 3 (three) times daily.    . Probiotic Product (TRUBIOTICS PO) Take 1 tablet by mouth every morning.    . Pyridoxine HCl (B-6 PO) Take 1 tablet by mouth every morning.    . saccharomyces boulardii (FLORASTOR) 250 MG capsule Take 1 capsule (250 mg total) by mouth 2 (two) times daily. 30 capsule 0   No current facility-administered medications on file prior to visit.    Allergies  Allergen Reactions  . Bactrim [Sulfamethoxazole-Trimethoprim] Swelling    "tongue swelling"  . Ibuprofen     Kidney issues    No results found for this or any previous visit (from the past 2160 hour(s)).  Objective: There were no vitals filed for this visit.  General: Patient is awake, alert, oriented x 3 and in no acute distress.  Dermatology: Skin is warm and dry bilateral with a full thickness ulceration present 5th met base left foot. Ulceration measures 3cm x 1cm x 1cm. There is a macerated border with a fibrogranular base. The ulceration does probe to bone. There is no malodor, + active drainage, decreased erythema/cellulitis, + edema.   Vascular: Dorsalis Pedis pulse = 1/4 Bilateral,  Posterior Tibial pulse = 1/4 Bilateral,  Capillary  Fill Time < 5 seconds  Neurologic: Protective sensation diminished using  the 5.07/10g [employer].  Musculosketal: There is decreased ankle joint range of motion Bilateral. There is decreased Subtalar joint range of motion Bilateral. There is a decrease in 1st metatarsophalangeal joint range of motion Bilateral. No Pain with palpation to ulcerated area. No pain with compression to calves bilateral. 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   Assessment and Plan:  Problem List Items Addressed This Visit    None    Visit  Diagnoses    Diabetic ulcer of left midfoot associated with type 2 diabetes mellitus, with fat layer exposed ([place])    -  Primary   Diabetic polyneuropathy associated with type 2 diabetes mellitus ([place])       Left foot pain         -Examined patient and discussed the progression of the wound and treatment alternatives. -Cleansed ulceration and applied dry sterile dressing with offloading padding. -Continue with wound care center follow up  -Continue with IV antibiotics to end 02-02-17 -Patient to return to office in 2 weeks for continued post op wound care.   Landis Martins, DPM

## 2017-01-26 ENCOUNTER — Ambulatory Visit (INDEPENDENT_AMBULATORY_CARE_PROVIDER_SITE_OTHER): Payer: Self-pay | Admitting: Sports Medicine

## 2017-01-26 DIAGNOSIS — L03119 Cellulitis of unspecified part of limb: Secondary | ICD-10-CM

## 2017-01-26 DIAGNOSIS — E1142 Type 2 diabetes mellitus with diabetic polyneuropathy: Secondary | ICD-10-CM

## 2017-01-26 DIAGNOSIS — L02619 Cutaneous abscess of unspecified foot: Secondary | ICD-10-CM

## 2017-01-26 DIAGNOSIS — M79672 Pain in left foot: Secondary | ICD-10-CM

## 2017-01-26 DIAGNOSIS — E11621 Type 2 diabetes mellitus with foot ulcer: Secondary | ICD-10-CM

## 2017-01-26 DIAGNOSIS — L97422 Non-pressure chronic ulcer of left heel and midfoot with fat layer exposed: Secondary | ICD-10-CM

## 2017-01-26 NOTE — Progress Notes (Signed)
Subjective: Kristopher Simon is a 51 y.o. male patient seen in office for POV #5 s/p Left foot incision and drainage with removal of bone 5th met base (Date of surgery 11-22-16). Patient reports that he is to start hyperbarics next week. Patient is on PICC line abx to end 02-02-17. Denies vomiting/chills/night sweats/shortness of breath/pain. Patient has no other pedal complaints at this time.  Patient Active Problem List   Diagnosis Date Noted  . DKA (diabetic ketoacidoses) ([place]) 07/06/2014  . Cellulitis of left lower extremity 07/06/2014  . Tachycardia with 100 - 120 beats per minute 07/06/2014  . GERD (gastroesophageal reflux disease) 07/06/2014  . Essential hypertension 07/06/2014  . Hyponatremia 07/06/2014  . CAD (coronary artery disease), native coronary artery 07/06/2014  . Acute kidney injury ([place]) 02/05/2012  . Diabetes mellitus ([place]) 02/05/2012  . HTN (hypertension) 02/05/2012  . Nausea & vomiting 02/05/2012  . Shortness of breath 02/05/2012   Current Outpatient Prescriptions on File Prior to Visit  Medication Sig Dispense Refill  . aspirin EC 81 MG tablet Take 81 mg by mouth daily.    Marland Kitchen CINNAMON PO Take 1 tablet by mouth every morning.    . cyclobenzaprine (FLEXERIL) 10 MG tablet Take 10-20 mg by mouth 2 (two) times daily. 5m in the morning and 222mat bedtime    . esomeprazole (NEXIUM) 40 MG capsule Take 40 mg by mouth every morning.    . gabapentin (NEURONTIN) 300 MG capsule Take 300 mg by mouth 3 (three) times daily.    . hydrochlorothiazide (HYDRODIURIL) 25 MG tablet Take 25 mg by mouth daily.    . insulin aspart (NOVOLOG FLEXPEN) 100 UNIT/ML FlexPen Inject 30-45 Units into the skin 3 (three) times daily with meals.    . insulin detemir (LEVEMIR) 100 UNIT/ML injection Inject 0.6 mLs (60 Units total) into the skin 2 (two) times daily. 10 mL 11  . metoCLOPramide (REGLAN) 5 MG tablet Take 1 tablet (5 mg total) by mouth 4 (four) times daily -  before meals and at bedtime. (Patient  not taking: Reported on 07/06/2014) 120 tablet 0  . metoprolol (LOPRESSOR) 100 MG tablet Take 100 mg by mouth 2 (two) times daily.    . metoprolol (LOPRESSOR) 50 MG tablet Take 50 mg by mouth 2 (two) times daily.    . Marland Kitchenystatin cream (MYCOSTATIN) Apply 1 application topically 3 (three) times daily.    . Probiotic Product (TRUBIOTICS PO) Take 1 tablet by mouth every morning.    . Pyridoxine HCl (B-6 PO) Take 1 tablet by mouth every morning.    . saccharomyces boulardii (FLORASTOR) 250 MG capsule Take 1 capsule (250 mg total) by mouth 2 (two) times daily. 30 capsule 0   No current facility-administered medications on file prior to visit.    Allergies  Allergen Reactions  . Bactrim [Sulfamethoxazole-Trimethoprim] Swelling    "tongue swelling"  . Ibuprofen     Kidney issues    No results found for this or any previous visit (from the past 2160 hour(s)).  Objective: There were no vitals filed for this visit.  General: Patient is awake, alert, oriented x 3 and in no acute distress.  Dermatology: Skin is warm and dry bilateral with a full thickness ulceration present 5th met base left foot. Ulceration measures 3cm x 1.5cm x 1cm. There is a macerated border with a fibrogranular base. The ulceration does probe to bone. There is no malodor, + Serous active drainage, decreased erythema/cellulitis, + edema.   Vascular: Dorsalis Pedis pulse =  1/4 Bilateral,  Posterior Tibial pulse = 1/4 Bilateral,  Capillary Fill Time < 5 seconds  Neurologic: Protective sensation diminished using  the 5.07/10g [employer].  Musculosketal: There is decreased ankle joint range of motion Bilateral. There is decreased Subtalar joint range of motion Bilateral. There is a decrease in 1st metatarsophalangeal joint range of motion Bilateral. No Pain with palpation to ulcerated area. No pain with compression to calves bilateral. 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   Assessment and Plan:   Problem List Items Addressed This Visit    None    Visit Diagnoses    Diabetic ulcer of left midfoot associated with type 2 diabetes mellitus, with fat layer exposed ([place])    -  Primary   Diabetic polyneuropathy associated with type 2 diabetes mellitus ([place])       Left foot pain       Cellulitis and abscess of foot, except toes       Slowly improving on PICC line antibiotics     -Examined patient and discussed the progression of the wound and treatment alternatives. -Cleansed ulceration and applied dry sterile dressing with offloading padding. -Continue with wound care center follow up and hyperbarics -Continue with IV antibiotics to end 02-02-17; denies. Patient, we will keep PICC line in place until next appointment and will determine if PICC line can be removed next visit. -Patient to return to office in 2 weeks for continued post op wound care.   Landis Martins, DPM

## 2017-02-09 ENCOUNTER — Ambulatory Visit (INDEPENDENT_AMBULATORY_CARE_PROVIDER_SITE_OTHER): Payer: Self-pay | Admitting: Sports Medicine

## 2017-02-09 DIAGNOSIS — E1142 Type 2 diabetes mellitus with diabetic polyneuropathy: Secondary | ICD-10-CM

## 2017-02-09 DIAGNOSIS — M79672 Pain in left foot: Secondary | ICD-10-CM

## 2017-02-09 DIAGNOSIS — L97422 Non-pressure chronic ulcer of left heel and midfoot with fat layer exposed: Secondary | ICD-10-CM

## 2017-02-09 DIAGNOSIS — E11621 Type 2 diabetes mellitus with foot ulcer: Secondary | ICD-10-CM

## 2017-02-09 NOTE — Progress Notes (Signed)
Subjective: Randen Kauth is a 51 y.o. male patient seen in office for POV #6 s/p Left foot incision and drainage with removal of bone 5th met base (Date of surgery 11-22-16). Patient reports that he is in hyperbarics and has wound vac. Has had on issues since finishing Invanz on 02-02-17; Patient is ready for PICC line removal. Denies vomiting/chills/night sweats/shortness of breath/pain. Patient has no other pedal complaints at this time.  Patient Active Problem List   Diagnosis Date Noted  . DKA (diabetic ketoacidoses) ([place]) 07/06/2014  . Cellulitis of left lower extremity 07/06/2014  . Tachycardia with 100 - 120 beats per minute 07/06/2014  . GERD (gastroesophageal reflux disease) 07/06/2014  . Essential hypertension 07/06/2014  . Hyponatremia 07/06/2014  . CAD (coronary artery disease), native coronary artery 07/06/2014  . Acute kidney injury ([place]) 02/05/2012  . Diabetes mellitus ([place]) 02/05/2012  . HTN (hypertension) 02/05/2012  . Nausea & vomiting 02/05/2012  . Shortness of breath 02/05/2012   Current Outpatient Prescriptions on File Prior to Visit  Medication Sig Dispense Refill  . aspirin EC 81 MG tablet Take 81 mg by mouth daily.    Marland Kitchen CINNAMON PO Take 1 tablet by mouth every morning.    . cyclobenzaprine (FLEXERIL) 10 MG tablet Take 10-20 mg by mouth 2 (two) times daily. 41m in the morning and 222mat bedtime    . esomeprazole (NEXIUM) 40 MG capsule Take 40 mg by mouth every morning.    . gabapentin (NEURONTIN) 300 MG capsule Take 300 mg by mouth 3 (three) times daily.    . hydrochlorothiazide (HYDRODIURIL) 25 MG tablet Take 25 mg by mouth daily.    . insulin aspart (NOVOLOG FLEXPEN) 100 UNIT/ML FlexPen Inject 30-45 Units into the skin 3 (three) times daily with meals.    . insulin detemir (LEVEMIR) 100 UNIT/ML injection Inject 0.6 mLs (60 Units total) into the skin 2 (two) times daily. 10 mL 11  . metoCLOPramide (REGLAN) 5 MG tablet Take 1 tablet (5 mg total) by mouth 4 (four)  times daily -  before meals and at bedtime. (Patient not taking: Reported on 07/06/2014) 120 tablet 0  . metoprolol (LOPRESSOR) 100 MG tablet Take 100 mg by mouth 2 (two) times daily.    . metoprolol (LOPRESSOR) 50 MG tablet Take 50 mg by mouth 2 (two) times daily.    . Marland Kitchenystatin cream (MYCOSTATIN) Apply 1 application topically 3 (three) times daily.    . Probiotic Product (TRUBIOTICS PO) Take 1 tablet by mouth every morning.    . Pyridoxine HCl (B-6 PO) Take 1 tablet by mouth every morning.    . saccharomyces boulardii (FLORASTOR) 250 MG capsule Take 1 capsule (250 mg total) by mouth 2 (two) times daily. 30 capsule 0   No current facility-administered medications on file prior to visit.    Allergies  Allergen Reactions  . Bactrim [Sulfamethoxazole-Trimethoprim] Swelling    "tongue swelling"  . Ibuprofen     Kidney issues    No results found for this or any previous visit (from the past 2160 hour(s)).  Objective: There were no vitals filed for this visit.  General: Patient is awake, alert, oriented x 3 and in no acute distress.  Physical exam: Wound VAC is on operating with no leak with very minimal drainage and canister at the left foot. No Pain with palpation to left foot. Capillary fill intact to all toes. No pain with compression to calves bilateral. 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  Assessment and Plan:  Problem List Items Addressed This Visit    None    Visit Diagnoses    Diabetic ulcer of left midfoot associated with type 2 diabetes mellitus, with fat layer exposed ([place])    -  Primary   Diabetic polyneuropathy associated with type 2 diabetes mellitus ([place])       Left foot pain         -Examined patient and discussed the progression of the wound and treatment alternatives. -Prescription with PICC line order given to patient for him to take to the hospital to have PICC line removed. Since he has completed IV antibiotics -Recommend continue with follow-up  at wound care center for wound VAC and for hyperbaric oxygen treatments -Continue with postoperative shoe and offloading padding at the left foot. -Patient to return to office in 2-3 weeks for continued post op wound care.   Landis Martins, DPM

## 2017-02-17 DIAGNOSIS — N189 Chronic kidney disease, unspecified: Secondary | ICD-10-CM

## 2017-02-17 DIAGNOSIS — E11621 Type 2 diabetes mellitus with foot ulcer: Secondary | ICD-10-CM

## 2017-02-17 DIAGNOSIS — R000 Tachycardia, unspecified: Secondary | ICD-10-CM

## 2017-02-17 DIAGNOSIS — E1169 Type 2 diabetes mellitus with other specified complication: Secondary | ICD-10-CM

## 2017-02-17 DIAGNOSIS — M609 Myositis, unspecified: Secondary | ICD-10-CM

## 2017-02-17 DIAGNOSIS — I2510 Atherosclerotic heart disease of native coronary artery without angina pectoris: Secondary | ICD-10-CM

## 2017-02-17 DIAGNOSIS — I129 Hypertensive chronic kidney disease with stage 1 through stage 4 chronic kidney disease, or unspecified chronic kidney disease: Secondary | ICD-10-CM

## 2017-02-17 DIAGNOSIS — E1122 Type 2 diabetes mellitus with diabetic chronic kidney disease: Secondary | ICD-10-CM

## 2017-02-17 DIAGNOSIS — L97522 Non-pressure chronic ulcer of other part of left foot with fat layer exposed: Secondary | ICD-10-CM

## 2017-02-17 DIAGNOSIS — M86472 Chronic osteomyelitis with draining sinus, left ankle and foot: Secondary | ICD-10-CM

## 2017-02-17 DIAGNOSIS — L03116 Cellulitis of left lower limb: Secondary | ICD-10-CM

## 2017-02-17 DIAGNOSIS — E669 Obesity, unspecified: Secondary | ICD-10-CM

## 2017-02-17 DIAGNOSIS — M79672 Pain in left foot: Secondary | ICD-10-CM

## 2017-02-17 DIAGNOSIS — F418 Other specified anxiety disorders: Secondary | ICD-10-CM

## 2017-02-20 HISTORY — PX: FOOT SURGERY: SHX648

## 2017-02-23 ENCOUNTER — Ambulatory Visit: Payer: Self-pay | Admitting: Sports Medicine

## 2017-02-23 DIAGNOSIS — M869 Osteomyelitis, unspecified: Secondary | ICD-10-CM

## 2017-02-23 DIAGNOSIS — L03119 Cellulitis of unspecified part of limb: Secondary | ICD-10-CM

## 2017-02-27 DIAGNOSIS — M869 Osteomyelitis, unspecified: Secondary | ICD-10-CM

## 2017-03-02 ENCOUNTER — Encounter: Payer: Self-pay | Admitting: Sports Medicine

## 2017-03-16 ENCOUNTER — Ambulatory Visit (INDEPENDENT_AMBULATORY_CARE_PROVIDER_SITE_OTHER): Payer: Self-pay | Admitting: Sports Medicine

## 2017-03-16 DIAGNOSIS — E1142 Type 2 diabetes mellitus with diabetic polyneuropathy: Secondary | ICD-10-CM

## 2017-03-16 DIAGNOSIS — Z8739 Personal history of other diseases of the musculoskeletal system and connective tissue: Secondary | ICD-10-CM

## 2017-03-16 DIAGNOSIS — E11621 Type 2 diabetes mellitus with foot ulcer: Secondary | ICD-10-CM

## 2017-03-16 DIAGNOSIS — L02619 Cutaneous abscess of unspecified foot: Secondary | ICD-10-CM

## 2017-03-16 DIAGNOSIS — M79672 Pain in left foot: Secondary | ICD-10-CM

## 2017-03-16 DIAGNOSIS — L97422 Non-pressure chronic ulcer of left heel and midfoot with fat layer exposed: Secondary | ICD-10-CM

## 2017-03-16 DIAGNOSIS — L03119 Cellulitis of unspecified part of limb: Secondary | ICD-10-CM

## 2017-03-16 NOTE — Progress Notes (Signed)
Subjective: Kristopher Simon is a 51 y.o. male patient seen in office for POV #1 s/p Left foot incision and drainage with removal of bone and placement of antibiotic beads  (Date of surgery 02-20-17). Patient is at [facility]. Patient is on [facility] Line antibiotics of Rocephin and Metronidazole. Denies vomiting/chills/night sweats/shortness of breath/pain. Admits that he has fallen twice. Patient has no other pedal complaints at this time.  Patient Active Problem List   Diagnosis Date Noted  . DKA (diabetic ketoacidoses) ([place]) 07/06/2014  . Cellulitis of left lower extremity 07/06/2014  . Tachycardia with 100 - 120 beats per minute 07/06/2014  . GERD (gastroesophageal reflux disease) 07/06/2014  . Essential hypertension 07/06/2014  . Hyponatremia 07/06/2014  . CAD (coronary artery disease), native coronary artery 07/06/2014  . Acute kidney injury ([place]) 02/05/2012  . Diabetes mellitus ([place]) 02/05/2012  . HTN (hypertension) 02/05/2012  . Nausea & vomiting 02/05/2012  . Shortness of breath 02/05/2012   Current Outpatient Prescriptions on File Prior to Visit  Medication Sig Dispense Refill  . aspirin EC 81 MG tablet Take 81 mg by mouth daily.    Marland Kitchen CINNAMON PO Take 1 tablet by mouth every morning.    . cyclobenzaprine (FLEXERIL) 10 MG tablet Take 10-20 mg by mouth 2 (two) times daily. 10mg  in the morning and 20mg  at bedtime    . esomeprazole (NEXIUM) 40 MG capsule Take 40 mg by mouth every morning.    . gabapentin (NEURONTIN) 300 MG capsule Take 300 mg by mouth 3 (three) times daily.    . hydrochlorothiazide (HYDRODIURIL) 25 MG tablet Take 25 mg by mouth daily.    . insulin aspart (NOVOLOG FLEXPEN) 100 UNIT/ML FlexPen Inject 30-45 Units into the skin 3 (three) times daily with meals.    . insulin detemir (LEVEMIR) 100 UNIT/ML injection Inject 0.6 mLs (60 Units total) into the skin 2 (two) times daily. 10 mL 11  . metoCLOPramide (REGLAN) 5 MG tablet Take 1 tablet (5 mg total) by mouth 4 (four)  times daily -  before meals and at bedtime. (Patient not taking: Reported on 07/06/2014) 120 tablet 0  . metoprolol (LOPRESSOR) 100 MG tablet Take 100 mg by mouth 2 (two) times daily.    . metoprolol (LOPRESSOR) 50 MG tablet Take 50 mg by mouth 2 (two) times daily.    Marland Kitchen nystatin cream (MYCOSTATIN) Apply 1 application topically 3 (three) times daily.    . Probiotic Product (TRUBIOTICS PO) Take 1 tablet by mouth every morning.    . Pyridoxine HCl (B-6 PO) Take 1 tablet by mouth every morning.    . saccharomyces boulardii (FLORASTOR) 250 MG capsule Take 1 capsule (250 mg total) by mouth 2 (two) times daily. 30 capsule 0   No current facility-administered medications on file prior to visit.    Allergies  Allergen Reactions  . Bactrim [Sulfamethoxazole-Trimethoprim] Swelling    "tongue swelling"  . Ibuprofen     Kidney issues    No results found for this or any previous visit (from the past 2160 hour(s)).  Objective: There were no vitals filed for this visit.  General: Patient is awake, alert, oriented x 3 and in no acute distress.  Physical exam:To left lateral foot there is a full thickness ulcerationWith a mix of fibro-granular tissue that probes deep down to soft tissue end range that measures 11 cm x 1.5 cm x 2.5 cm decreased erythema. Decreased edema. Mild bloody drainage, no other acute signs of infection. To Anterior shin on the left.  There is multiple abrasions in no acute signs of infection . No Pain with palpation to left foot. Capillary fill intact to all toes. No pain with compression to calves bilateral. 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   Assessment and Plan:  Problem List Items Addressed This Visit    None    Visit Diagnoses    Diabetic ulcer of left midfoot associated with type 2 diabetes mellitus, with fat layer exposed ([place])    -  Primary   Diabetic polyneuropathy associated with type 2 diabetes mellitus ([place])       Left foot pain       Cellulitis and  abscess of foot, except toes       History of osteomyelitis         -Examined patient and discussed the progression of the wound and treatment alternatives. -Applied [place] packing to left lateral wound and antibiotic cream to abrasion on left -Continue with PICC line antibiotics -Continue with Nonweightbearing; Advised patient to stay compliant with this status -Continue with postoperative shoe and offloading padding at the left foot. -Continue with elevation when sitting  -Patient to return to office in 2 weeks for continued post op wound care.   Landis Martins, DPM

## 2017-04-06 ENCOUNTER — Ambulatory Visit (INDEPENDENT_AMBULATORY_CARE_PROVIDER_SITE_OTHER): Payer: Self-pay | Admitting: Sports Medicine

## 2017-04-06 DIAGNOSIS — Z8739 Personal history of other diseases of the musculoskeletal system and connective tissue: Secondary | ICD-10-CM

## 2017-04-06 DIAGNOSIS — L97422 Non-pressure chronic ulcer of left heel and midfoot with fat layer exposed: Secondary | ICD-10-CM

## 2017-04-06 DIAGNOSIS — L03119 Cellulitis of unspecified part of limb: Secondary | ICD-10-CM

## 2017-04-06 DIAGNOSIS — L02619 Cutaneous abscess of unspecified foot: Secondary | ICD-10-CM

## 2017-04-06 DIAGNOSIS — E1142 Type 2 diabetes mellitus with diabetic polyneuropathy: Secondary | ICD-10-CM

## 2017-04-06 DIAGNOSIS — E11621 Type 2 diabetes mellitus with foot ulcer: Secondary | ICD-10-CM

## 2017-04-06 DIAGNOSIS — M79672 Pain in left foot: Secondary | ICD-10-CM

## 2017-04-06 NOTE — Progress Notes (Signed)
Subjective: Kristopher Simon is a 51 y.o. male patient seen in office for POV #2 s/p Left foot incision and drainage with removal of bone and placement of antibiotic beads  (Date of surgery 02-20-17). Patient is at [facility]. Patient is on [facility] Line antibiotics of Rocephin and Metronidazole. 2. In situ. Denies vomiting/chills/night sweats/shortness of breath/pain. Admits that he is doing good. Patient has no other pedal complaints at this time.  Patient Active Problem List   Diagnosis Date Noted  . DKA (diabetic ketoacidoses) ([place]) 07/06/2014  . Cellulitis of left lower extremity 07/06/2014  . Tachycardia with 100 - 120 beats per minute 07/06/2014  . GERD (gastroesophageal reflux disease) 07/06/2014  . Essential hypertension 07/06/2014  . Hyponatremia 07/06/2014  . CAD (coronary artery disease), native coronary artery 07/06/2014  . Acute kidney injury ([place]) 02/05/2012  . Diabetes mellitus ([place]) 02/05/2012  . HTN (hypertension) 02/05/2012  . Nausea & vomiting 02/05/2012  . Shortness of breath 02/05/2012   Current Outpatient Prescriptions on File Prior to Visit  Medication Sig Dispense Refill  . aspirin EC 81 MG tablet Take 81 mg by mouth daily.    Marland Kitchen CINNAMON PO Take 1 tablet by mouth every morning.    . cyclobenzaprine (FLEXERIL) 10 MG tablet Take 10-20 mg by mouth 2 (two) times daily. 10mg  in the morning and 20mg  at bedtime    . esomeprazole (NEXIUM) 40 MG capsule Take 40 mg by mouth every morning.    . gabapentin (NEURONTIN) 300 MG capsule Take 300 mg by mouth 3 (three) times daily.    . hydrochlorothiazide (HYDRODIURIL) 25 MG tablet Take 25 mg by mouth daily.    . insulin aspart (NOVOLOG FLEXPEN) 100 UNIT/ML FlexPen Inject 30-45 Units into the skin 3 (three) times daily with meals.    . insulin detemir (LEVEMIR) 100 UNIT/ML injection Inject 0.6 mLs (60 Units total) into the skin 2 (two) times daily. 10 mL 11  . metoCLOPramide (REGLAN) 5 MG tablet Take 1 tablet (5 mg total) by mouth 4  (four) times daily -  before meals and at bedtime. (Patient not taking: Reported on 07/06/2014) 120 tablet 0  . metoprolol (LOPRESSOR) 100 MG tablet Take 100 mg by mouth 2 (two) times daily.    . metoprolol (LOPRESSOR) 50 MG tablet Take 50 mg by mouth 2 (two) times daily.    Marland Kitchen nystatin cream (MYCOSTATIN) Apply 1 application topically 3 (three) times daily.    . Probiotic Product (TRUBIOTICS PO) Take 1 tablet by mouth every morning.    . Pyridoxine HCl (B-6 PO) Take 1 tablet by mouth every morning.    . saccharomyces boulardii (FLORASTOR) 250 MG capsule Take 1 capsule (250 mg total) by mouth 2 (two) times daily. 30 capsule 0   No current facility-administered medications on file prior to visit.    Allergies  Allergen Reactions  . Bactrim [Sulfamethoxazole-Trimethoprim] Swelling    "tongue swelling"  . Ibuprofen     Kidney issues    No results found for this or any previous visit (from the past 2160 hour(s)).  Objective: There were no vitals filed for this visit.  General: Patient is awake, alert, oriented x 3 and in no acute distress.  Physical exam:To left lateral foot there is a full thickness ulcerationWith a mix of fibro-granular tissue that probes deep down to soft tissue end range that measures 10 cm x 1.5 cm x 2.0 cm, improving in size with decreased erythema. Decreased edema. Mild bloody drainage, no other acute signs of  infection. Marland KitchenResolved abrasions on anterior shin on left. No Pain with palpation to left foot. Capillary fill intact to all toes. No pain with compression to calves bilateral. 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   Assessment and Plan:  Problem List Items Addressed This Visit    None    Visit Diagnoses    Diabetic ulcer of left midfoot associated with type 2 diabetes mellitus, with fat layer exposed ([place])    -  Primary   Diabetic polyneuropathy associated with type 2 diabetes mellitus ([place])       Left foot pain       Cellulitis and abscess of  foot, except toes       History of osteomyelitis         -Examined patient and discussed the progression of the wound and treatment alternatives. -Applied iodoform packing to left lateral wound on left; orders sent to [facility] and rehabilitation to perform wound care daily consisting of the same.  -Continue with PICC line antibiotics until completed  -Continue with Nonweightbearing; Advised patient to stay compliant with this status -Continue with postoperative shoe and offloading padding at the left foot. -Continue with elevation when sitting  -Patient to return to office in 2 weeks for continued post op wound care.   Landis Martins, DPM

## 2017-04-20 ENCOUNTER — Ambulatory Visit (INDEPENDENT_AMBULATORY_CARE_PROVIDER_SITE_OTHER): Payer: Self-pay | Admitting: Sports Medicine

## 2017-04-20 ENCOUNTER — Encounter (INDEPENDENT_AMBULATORY_CARE_PROVIDER_SITE_OTHER): Payer: Self-pay

## 2017-04-20 DIAGNOSIS — M79672 Pain in left foot: Secondary | ICD-10-CM

## 2017-04-20 DIAGNOSIS — E1142 Type 2 diabetes mellitus with diabetic polyneuropathy: Secondary | ICD-10-CM

## 2017-04-20 DIAGNOSIS — L97422 Non-pressure chronic ulcer of left heel and midfoot with fat layer exposed: Secondary | ICD-10-CM

## 2017-04-20 DIAGNOSIS — Z8739 Personal history of other diseases of the musculoskeletal system and connective tissue: Secondary | ICD-10-CM

## 2017-04-20 DIAGNOSIS — E11621 Type 2 diabetes mellitus with foot ulcer: Secondary | ICD-10-CM

## 2017-04-20 NOTE — Progress Notes (Signed)
Subjective: Kristopher Simon is a 51 y.o. male patient seen in office for POV #3 s/p Left foot incision and drainage with removal of bone and placement of antibiotic beads  (Date of surgery 02-20-17). Patient is at [facility]. Patient is on [facility] Line antibiotics of Rocephin and Metronidazole of which he finished 1 weeks ago. Denies vomiting/chills/night sweats/shortness of breath/pain. States that he is doing good. Patient has no other pedal complaints at this time.  Patient Active Problem List   Diagnosis Date Noted  . DKA (diabetic ketoacidoses) ([place]) 07/06/2014  . Cellulitis of left lower extremity 07/06/2014  . Tachycardia with 100 - 120 beats per minute 07/06/2014  . GERD (gastroesophageal reflux disease) 07/06/2014  . Essential hypertension 07/06/2014  . Hyponatremia 07/06/2014  . CAD (coronary artery disease), native coronary artery 07/06/2014  . Acute kidney injury ([place]) 02/05/2012  . Diabetes mellitus ([place]) 02/05/2012  . HTN (hypertension) 02/05/2012  . Nausea & vomiting 02/05/2012  . Shortness of breath 02/05/2012   Current Outpatient Prescriptions on File Prior to Visit  Medication Sig Dispense Refill  . aspirin EC 81 MG tablet Take 81 mg by mouth daily.    Marland Kitchen CINNAMON PO Take 1 tablet by mouth every morning.    . cyclobenzaprine (FLEXERIL) 10 MG tablet Take 10-20 mg by mouth 2 (two) times daily. 10mg  in the morning and 20mg  at bedtime    . esomeprazole (NEXIUM) 40 MG capsule Take 40 mg by mouth every morning.    . gabapentin (NEURONTIN) 300 MG capsule Take 300 mg by mouth 3 (three) times daily.    . hydrochlorothiazide (HYDRODIURIL) 25 MG tablet Take 25 mg by mouth daily.    . insulin aspart (NOVOLOG FLEXPEN) 100 UNIT/ML FlexPen Inject 30-45 Units into the skin 3 (three) times daily with meals.    . insulin detemir (LEVEMIR) 100 UNIT/ML injection Inject 0.6 mLs (60 Units total) into the skin 2 (two) times daily. 10 mL 11  . metoCLOPramide (REGLAN) 5 MG tablet Take 1 tablet (5  mg total) by mouth 4 (four) times daily -  before meals and at bedtime. (Patient not taking: Reported on 07/06/2014) 120 tablet 0  . metoprolol (LOPRESSOR) 100 MG tablet Take 100 mg by mouth 2 (two) times daily.    . metoprolol (LOPRESSOR) 50 MG tablet Take 50 mg by mouth 2 (two) times daily.    Marland Kitchen nystatin cream (MYCOSTATIN) Apply 1 application topically 3 (three) times daily.    . Probiotic Product (TRUBIOTICS PO) Take 1 tablet by mouth every morning.    . Pyridoxine HCl (B-6 PO) Take 1 tablet by mouth every morning.    . saccharomyces boulardii (FLORASTOR) 250 MG capsule Take 1 capsule (250 mg total) by mouth 2 (two) times daily. 30 capsule 0   No current facility-administered medications on file prior to visit.    Allergies  Allergen Reactions  . Bactrim [Sulfamethoxazole-Trimethoprim] Swelling    "tongue swelling"  . Ibuprofen     Kidney issues    No results found for this or any previous visit (from the past 2160 hour(s)).  Objective: There were no vitals filed for this visit.  General: Patient is awake, alert, oriented x 3 and in no acute distress.  Physical exam:To left lateral foot there is a full thickness ulcerationWith a mix of fibro-granular tissue that probes deep down to soft tissue end range that measures 8 cm x 1.5 cm x 2.5 cm, improving in size with decreased erythema. Decreased edema. Mild bloody drainage, no  other acute signs of infection. Marland KitchenResolved abrasions on anterior shin on left. No Pain with palpation to left foot. Capillary fill intact to all toes. No pain with compression to calves bilateral. 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   Assessment and Plan:  Problem List Items Addressed This Visit    None    Visit Diagnoses    Diabetic ulcer of left midfoot associated with type 2 diabetes mellitus, with fat layer exposed ([place])    -  Primary   Diabetic polyneuropathy associated with type 2 diabetes mellitus ([place])       Left foot pain       History  of osteomyelitis         -Examined patient and discussed the progression of the wound and treatment alternatives. -Applied iodoform packing to left lateral wound on left; orders sent to [facility] and rehabilitation to perform wound care daily consisting of the same with betadine at the pheriphery. Do not over-pack.  -keep PICC line in place until next follow up visit -Continue with Nonweightbearing; Advised patient to stay compliant with this status with use of wheelchair -Continue with postoperative shoe and offloading padding at the left foot for protection -Continue with elevation when sitting  -Patient to return to office in 2 weeks for continued post op wound care.   Landis Martins, DPM

## 2017-05-05 ENCOUNTER — Ambulatory Visit (INDEPENDENT_AMBULATORY_CARE_PROVIDER_SITE_OTHER): Payer: Self-pay | Admitting: Sports Medicine

## 2017-05-05 DIAGNOSIS — E1142 Type 2 diabetes mellitus with diabetic polyneuropathy: Secondary | ICD-10-CM

## 2017-05-05 DIAGNOSIS — L97422 Non-pressure chronic ulcer of left heel and midfoot with fat layer exposed: Secondary | ICD-10-CM

## 2017-05-05 DIAGNOSIS — M79672 Pain in left foot: Secondary | ICD-10-CM

## 2017-05-05 DIAGNOSIS — Z8739 Personal history of other diseases of the musculoskeletal system and connective tissue: Secondary | ICD-10-CM

## 2017-05-05 DIAGNOSIS — E11621 Type 2 diabetes mellitus with foot ulcer: Secondary | ICD-10-CM

## 2017-05-05 NOTE — Progress Notes (Signed)
Subjective: Inioluwa Baris is a 51 y.o. male patient seen in office for POV #4 s/p Left foot incision and drainage with removal of bone and placement of antibiotic beads  (Date of surgery 02-20-17). Patient is at [facility]. Patient finished PICC line antibiotics. Denies vomiting/chills/night sweats/shortness of breath/pain. States that he is doing good. Patient has no other pedal complaints at this time.  Patient Active Problem List   Diagnosis Date Noted  . DKA (diabetic ketoacidoses) ([place]) 07/06/2014  . Cellulitis of left lower extremity 07/06/2014  . Tachycardia with 100 - 120 beats per minute 07/06/2014  . GERD (gastroesophageal reflux disease) 07/06/2014  . Essential hypertension 07/06/2014  . Hyponatremia 07/06/2014  . CAD (coronary artery disease), native coronary artery 07/06/2014  . Acute kidney injury ([place]) 02/05/2012  . Diabetes mellitus ([place]) 02/05/2012  . HTN (hypertension) 02/05/2012  . Nausea & vomiting 02/05/2012  . Shortness of breath 02/05/2012   Current Outpatient Prescriptions on File Prior to Visit  Medication Sig Dispense Refill  . aspirin EC 81 MG tablet Take 81 mg by mouth daily.    Marland Kitchen CINNAMON PO Take 1 tablet by mouth every morning.    . cyclobenzaprine (FLEXERIL) 10 MG tablet Take 10-20 mg by mouth 2 (two) times daily. 10mg  in the morning and 20mg  at bedtime    . esomeprazole (NEXIUM) 40 MG capsule Take 40 mg by mouth every morning.    . gabapentin (NEURONTIN) 300 MG capsule Take 300 mg by mouth 3 (three) times daily.    . hydrochlorothiazide (HYDRODIURIL) 25 MG tablet Take 25 mg by mouth daily.    . insulin aspart (NOVOLOG FLEXPEN) 100 UNIT/ML FlexPen Inject 30-45 Units into the skin 3 (three) times daily with meals.    . insulin detemir (LEVEMIR) 100 UNIT/ML injection Inject 0.6 mLs (60 Units total) into the skin 2 (two) times daily. 10 mL 11  . metoCLOPramide (REGLAN) 5 MG tablet Take 1 tablet (5 mg total) by mouth 4 (four) times daily -  before meals and  at bedtime. (Patient not taking: Reported on 07/06/2014) 120 tablet 0  . metoprolol (LOPRESSOR) 100 MG tablet Take 100 mg by mouth 2 (two) times daily.    . metoprolol (LOPRESSOR) 50 MG tablet Take 50 mg by mouth 2 (two) times daily.    Marland Kitchen nystatin cream (MYCOSTATIN) Apply 1 application topically 3 (three) times daily.    . Probiotic Product (TRUBIOTICS PO) Take 1 tablet by mouth every morning.    . Pyridoxine HCl (B-6 PO) Take 1 tablet by mouth every morning.    . saccharomyces boulardii (FLORASTOR) 250 MG capsule Take 1 capsule (250 mg total) by mouth 2 (two) times daily. 30 capsule 0   No current facility-administered medications on file prior to visit.    Allergies  Allergen Reactions  . Bactrim [Sulfamethoxazole-Trimethoprim] Swelling    "tongue swelling"  . Ibuprofen     Kidney issues    No results found for this or any previous visit (from the past 2160 hour(s)).  Objective: There were no vitals filed for this visit.  General: Patient is awake, alert, oriented x 3 and in no acute distress.  Physical exam:To left lateral foot there is a full thickness ulcerationWith a mix of fibro-granular tissue that probes deep down to soft tissue end range that measures 4.5 cm x 1.5 cm x 1.5 cm, improving in size with decreased erythema. Decreased edema. Mild bloody drainage, no other acute signs of infection. Mild abrasions on anterior shin on left.  No Pain with palpation to left foot. Capillary fill intact to all toes. No pain with compression to calves bilateral. 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   Assessment and Plan:  Problem List Items Addressed This Visit    None    Visit Diagnoses    Diabetic ulcer of left midfoot associated with type 2 diabetes mellitus, with fat layer exposed ([place])    -  Primary   Diabetic polyneuropathy associated with type 2 diabetes mellitus ([place])       Left foot pain       History of osteomyelitis         -Examined patient and discussed the  progression of the wound and treatment alternatives. -Applied iodoform packing to left lateral wound and Adaptic to anterior shin on left; orders sent to [facility] and rehabilitation to perform wound care daily consisting of the same with betadine at the pheriphery. Do not over-pack.  -May remove PICC line -Continue with Nonweightbearing; Advised patient to stay compliant with this status with use of wheelchair -Continue with postoperative shoe and offloading padding at the left foot for protection -Continue with elevation when sitting  -Patient to return to office in 2 weeks for continued post op wound care.   Landis Martins, DPM

## 2017-05-19 ENCOUNTER — Ambulatory Visit: Payer: Self-pay | Admitting: Sports Medicine

## 2017-05-19 ENCOUNTER — Ambulatory Visit (INDEPENDENT_AMBULATORY_CARE_PROVIDER_SITE_OTHER): Payer: Self-pay | Admitting: Sports Medicine

## 2017-05-19 DIAGNOSIS — L97422 Non-pressure chronic ulcer of left heel and midfoot with fat layer exposed: Secondary | ICD-10-CM

## 2017-05-19 DIAGNOSIS — M79672 Pain in left foot: Secondary | ICD-10-CM

## 2017-05-19 DIAGNOSIS — E11621 Type 2 diabetes mellitus with foot ulcer: Secondary | ICD-10-CM

## 2017-05-19 DIAGNOSIS — Z8739 Personal history of other diseases of the musculoskeletal system and connective tissue: Secondary | ICD-10-CM

## 2017-05-19 DIAGNOSIS — E1142 Type 2 diabetes mellitus with diabetic polyneuropathy: Secondary | ICD-10-CM

## 2017-05-19 NOTE — Progress Notes (Signed)
Subjective: Kristopher Simon is a 51 y.o. male patient seen in office for POV #5 s/p Left foot incision and drainage with removal of bone and placement of antibiotic beads  (Date of surgery 02-20-17). Patient is at [facility]. Patient finished PICC line antibiotics several weeks ago. Denies vomiting/chills/night sweats/shortness of breath/pain. States that he is doing good. Patient has no other pedal complaints at this time.  Patient Active Problem List   Diagnosis Date Noted  . DKA (diabetic ketoacidoses) ([place]) 07/06/2014  . Cellulitis of left lower extremity 07/06/2014  . Tachycardia with 100 - 120 beats per minute 07/06/2014  . GERD (gastroesophageal reflux disease) 07/06/2014  . Essential hypertension 07/06/2014  . Hyponatremia 07/06/2014  . CAD (coronary artery disease), native coronary artery 07/06/2014  . Acute kidney injury ([place]) 02/05/2012  . Diabetes mellitus ([place]) 02/05/2012  . HTN (hypertension) 02/05/2012  . Nausea & vomiting 02/05/2012  . Shortness of breath 02/05/2012   Current Outpatient Prescriptions on File Prior to Visit  Medication Sig Dispense Refill  . aspirin EC 81 MG tablet Take 81 mg by mouth daily.    Marland Kitchen CINNAMON PO Take 1 tablet by mouth every morning.    . cyclobenzaprine (FLEXERIL) 10 MG tablet Take 10-20 mg by mouth 2 (two) times daily. 10mg  in the morning and 20mg  at bedtime    . esomeprazole (NEXIUM) 40 MG capsule Take 40 mg by mouth every morning.    . gabapentin (NEURONTIN) 300 MG capsule Take 300 mg by mouth 3 (three) times daily.    . hydrochlorothiazide (HYDRODIURIL) 25 MG tablet Take 25 mg by mouth daily.    . insulin aspart (NOVOLOG FLEXPEN) 100 UNIT/ML FlexPen Inject 30-45 Units into the skin 3 (three) times daily with meals.    . insulin detemir (LEVEMIR) 100 UNIT/ML injection Inject 0.6 mLs (60 Units total) into the skin 2 (two) times daily. 10 mL 11  . metoCLOPramide (REGLAN) 5 MG tablet Take 1 tablet (5 mg total) by mouth 4 (four) times daily -   before meals and at bedtime. (Patient not taking: Reported on 07/06/2014) 120 tablet 0  . metoprolol (LOPRESSOR) 100 MG tablet Take 100 mg by mouth 2 (two) times daily.    . metoprolol (LOPRESSOR) 50 MG tablet Take 50 mg by mouth 2 (two) times daily.    Marland Kitchen nystatin cream (MYCOSTATIN) Apply 1 application topically 3 (three) times daily.    . Probiotic Product (TRUBIOTICS PO) Take 1 tablet by mouth every morning.    . Pyridoxine HCl (B-6 PO) Take 1 tablet by mouth every morning.    . saccharomyces boulardii (FLORASTOR) 250 MG capsule Take 1 capsule (250 mg total) by mouth 2 (two) times daily. 30 capsule 0   No current facility-administered medications on file prior to visit.    Allergies  Allergen Reactions  . Bactrim [Sulfamethoxazole-Trimethoprim] Swelling    "tongue swelling"  . Ibuprofen     Kidney issues    No results found for this or any previous visit (from the past 2160 hour(s)).  Objective: There were no vitals filed for this visit.  General: Patient is awake, alert, oriented x 3 and in no acute distress.  Physical exam:To left lateral foot there is a full thickness ulcerationWith a mix of fibro-granular tissue that probes deep down to soft tissue end range that measures 4.0 cm x 1.0 cm x 1.5 cm, improving in size with decreased erythema. Decreased edema. Mild bloody drainage, no other acute signs of infection. Mild abrasions on anterior  shin on left. No Pain with palpation to left foot. Capillary fill intact to all toes. No pain with compression to calves bilateral. 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   Assessment and Plan:  Problem List Items Addressed This Visit    None    Visit Diagnoses    Diabetic ulcer of left midfoot associated with type 2 diabetes mellitus, with fat layer exposed ([place])    -  Primary   Diabetic polyneuropathy associated with type 2 diabetes mellitus ([place])       Left foot pain       History of osteomyelitis         -Examined patient  and discussed the progression of the wound and treatment alternatives. -Applied iodoform packing to left lateral wound and Adaptic to anterior shin on left; orders sent to [facility] and rehabilitation to perform wound care daily consisting of the same with betadine at the pheriphery. Do not over-pack.  -Continue with Nonweightbearing; Advised patient to stay compliant with this status with use of wheelchair -Continue with postoperative shoe and offloading padding at the left foot for protection -Continue with elevation when sitting  -Patient to return to office in 2 weeks for continued post op wound care.   Landis Martins, DPM

## 2017-05-25 ENCOUNTER — Encounter: Payer: Self-pay | Admitting: Sports Medicine

## 2017-05-25 NOTE — Progress Notes (Signed)
Requested medical records by Dis[employer] (DDS) were mailed out today Thursday 25 May 2017 from the [place] location.

## 2017-06-01 ENCOUNTER — Ambulatory Visit (INDEPENDENT_AMBULATORY_CARE_PROVIDER_SITE_OTHER): Payer: Self-pay | Admitting: Sports Medicine

## 2017-06-01 DIAGNOSIS — L02619 Cutaneous abscess of unspecified foot: Secondary | ICD-10-CM

## 2017-06-01 DIAGNOSIS — E11621 Type 2 diabetes mellitus with foot ulcer: Secondary | ICD-10-CM

## 2017-06-01 DIAGNOSIS — Z8739 Personal history of other diseases of the musculoskeletal system and connective tissue: Secondary | ICD-10-CM

## 2017-06-01 DIAGNOSIS — E1142 Type 2 diabetes mellitus with diabetic polyneuropathy: Secondary | ICD-10-CM

## 2017-06-01 DIAGNOSIS — M79672 Pain in left foot: Secondary | ICD-10-CM

## 2017-06-01 DIAGNOSIS — L03119 Cellulitis of unspecified part of limb: Secondary | ICD-10-CM

## 2017-06-01 DIAGNOSIS — L97422 Non-pressure chronic ulcer of left heel and midfoot with fat layer exposed: Secondary | ICD-10-CM

## 2017-06-01 MED ORDER — MUPIROCIN 2 % EX OINT: TOPICAL_OINTMENT | CUTANEOUS | 0 refills | Status: DC

## 2017-06-01 NOTE — Progress Notes (Signed)
Subjective: Kristopher Simon is a 51 y.o. male patient seen in office for POV #6 s/p Left foot incision and drainage with removal of bone and placement of antibiotic beads  (Date of surgery 02-20-17). Patient is at [facility]. Patient reports they put him on clindamycin for redness at his shin. Denies vomiting/chills/night sweats/shortness of breath/pain. States that he is doing good and is doing nwb exercises with PT. Patient has no other pedal complaints at this time.  Patient Active Problem List   Diagnosis Date Noted  . DKA (diabetic ketoacidoses) ([place]) 07/06/2014  . Cellulitis of left lower extremity 07/06/2014  . Tachycardia with 100 - 120 beats per minute 07/06/2014  . GERD (gastroesophageal reflux disease) 07/06/2014  . Essential hypertension 07/06/2014  . Hyponatremia 07/06/2014  . CAD (coronary artery disease), native coronary artery 07/06/2014  . Acute kidney injury ([place]) 02/05/2012  . Diabetes mellitus ([place]) 02/05/2012  . HTN (hypertension) 02/05/2012  . Nausea & vomiting 02/05/2012  . Shortness of breath 02/05/2012   Current Outpatient Prescriptions on File Prior to Visit  Medication Sig Dispense Refill  . aspirin EC 81 MG tablet Take 81 mg by mouth daily.    Marland Kitchen CINNAMON PO Take 1 tablet by mouth every morning.    . cyclobenzaprine (FLEXERIL) 10 MG tablet Take 10-20 mg by mouth 2 (two) times daily. 10mg  in the morning and 20mg  at bedtime    . esomeprazole (NEXIUM) 40 MG capsule Take 40 mg by mouth every morning.    . gabapentin (NEURONTIN) 300 MG capsule Take 300 mg by mouth 3 (three) times daily.    . hydrochlorothiazide (HYDRODIURIL) 25 MG tablet Take 25 mg by mouth daily.    . insulin aspart (NOVOLOG FLEXPEN) 100 UNIT/ML FlexPen Inject 30-45 Units into the skin 3 (three) times daily with meals.    . insulin detemir (LEVEMIR) 100 UNIT/ML injection Inject 0.6 mLs (60 Units total) into the skin 2 (two) times daily. 10 mL 11  . metoCLOPramide (REGLAN) 5 MG tablet Take 1 tablet  (5 mg total) by mouth 4 (four) times daily -  before meals and at bedtime. (Patient not taking: Reported on 07/06/2014) 120 tablet 0  . metoprolol (LOPRESSOR) 100 MG tablet Take 100 mg by mouth 2 (two) times daily.    . metoprolol (LOPRESSOR) 50 MG tablet Take 50 mg by mouth 2 (two) times daily.    Marland Kitchen nystatin cream (MYCOSTATIN) Apply 1 application topically 3 (three) times daily.    . Probiotic Product (TRUBIOTICS PO) Take 1 tablet by mouth every morning.    . Pyridoxine HCl (B-6 PO) Take 1 tablet by mouth every morning.    . saccharomyces boulardii (FLORASTOR) 250 MG capsule Take 1 capsule (250 mg total) by mouth 2 (two) times daily. 30 capsule 0   No current facility-administered medications on file prior to visit.    Allergies  Allergen Reactions  . Bactrim [Sulfamethoxazole-Trimethoprim] Swelling    "tongue swelling"  . Ibuprofen     Kidney issues    No results found for this or any previous visit (from the past 2160 hour(s)).  Objective: There were no vitals filed for this visit.  General: Patient is awake, alert, oriented x 3 and in no acute distress.  Physical exam:To left lateral foot there is a full thickness ulceration with a mix of fibro-granular tissue that probes deep down to soft tissue end range that measures 4.0 cm x 0.5 cm x 0.3 cm, improving in size with decreased erythema. Decreased edema. Mild  bloody drainage, no other acute signs of infection. Mild abrasions on anterior shin with ruptured blisters that appear to be venous stasis on left. No Pain with palpation to left foot. Capillary fill intact to all toes. No pain with compression to calves bilateral. 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   Assessment and Plan:  Problem List Items Addressed This Visit    None    Visit Diagnoses    Diabetic ulcer of left midfoot associated with type 2 diabetes mellitus, with fat layer exposed ([place])    -  Primary   Diabetic polyneuropathy associated with type 2  diabetes mellitus ([place])       Left foot pain       History of osteomyelitis       Cellulitis and abscess of foot, except toes       Relevant Medications   mupirocin ointment (BACTROBAN) 2 %     -Examined patient and discussed the progression of the wound and treatment alternatives. -Applied iodoform packing to left lateral wound and Adaptic to anterior shin on left; orders sent to [facility] and rehabilitation to perform wound care daily consisting of the same with betadine at the pheriphery. Do not over-pack.  -Rx Bactroban cream to use at shin -Recommend continue with clindamycin  -Continue with Nonweightbearing; Advised patient to stay compliant with this status with use of wheelchair. May do nonweightbearing exercise with PT. -Continue with postoperative shoe and offloading padding at the left foot for protection -Continue with elevation when sitting  -Patient to return to office in 2 weeks for continued post op wound care.   Landis Martins, DPM

## 2017-06-15 ENCOUNTER — Ambulatory Visit (INDEPENDENT_AMBULATORY_CARE_PROVIDER_SITE_OTHER): Payer: Self-pay | Admitting: Sports Medicine

## 2017-06-15 DIAGNOSIS — E1142 Type 2 diabetes mellitus with diabetic polyneuropathy: Secondary | ICD-10-CM

## 2017-06-15 DIAGNOSIS — L03119 Cellulitis of unspecified part of limb: Secondary | ICD-10-CM

## 2017-06-15 DIAGNOSIS — M79672 Pain in left foot: Secondary | ICD-10-CM

## 2017-06-15 DIAGNOSIS — L02619 Cutaneous abscess of unspecified foot: Secondary | ICD-10-CM

## 2017-06-15 DIAGNOSIS — L97422 Non-pressure chronic ulcer of left heel and midfoot with fat layer exposed: Secondary | ICD-10-CM

## 2017-06-15 DIAGNOSIS — E11621 Type 2 diabetes mellitus with foot ulcer: Secondary | ICD-10-CM

## 2017-06-15 DIAGNOSIS — Z8739 Personal history of other diseases of the musculoskeletal system and connective tissue: Secondary | ICD-10-CM

## 2017-06-15 NOTE — Progress Notes (Signed)
Subjective: Kristopher Simon is a 51 y.o. male patient seen in office for POV #7 s/p Left foot incision and drainage with removal of bone and placement of antibiotic beads  (Date of surgery 02-20-17). Patient is at [facility]. Patient reports that he is doing good. Denies vomiting/chills/night sweats/shortness of breath/pain. Patient has no other pedal complaints at this time.  Patient Active Problem List   Diagnosis Date Noted  . DKA (diabetic ketoacidoses) ([place]) 07/06/2014  . Cellulitis of left lower extremity 07/06/2014  . Tachycardia with 100 - 120 beats per minute 07/06/2014  . GERD (gastroesophageal reflux disease) 07/06/2014  . Essential hypertension 07/06/2014  . Hyponatremia 07/06/2014  . CAD (coronary artery disease), native coronary artery 07/06/2014  . Acute kidney injury ([place]) 02/05/2012  . Diabetes mellitus ([place]) 02/05/2012  . HTN (hypertension) 02/05/2012  . Nausea & vomiting 02/05/2012  . Shortness of breath 02/05/2012   Current Outpatient Prescriptions on File Prior to Visit  Medication Sig Dispense Refill  . aspirin EC 81 MG tablet Take 81 mg by mouth daily.    Marland Kitchen CINNAMON PO Take 1 tablet by mouth every morning.    . cyclobenzaprine (FLEXERIL) 10 MG tablet Take 10-20 mg by mouth 2 (two) times daily. 10mg  in the morning and 20mg  at bedtime    . esomeprazole (NEXIUM) 40 MG capsule Take 40 mg by mouth every morning.    . gabapentin (NEURONTIN) 300 MG capsule Take 300 mg by mouth 3 (three) times daily.    . hydrochlorothiazide (HYDRODIURIL) 25 MG tablet Take 25 mg by mouth daily.    . insulin aspart (NOVOLOG FLEXPEN) 100 UNIT/ML FlexPen Inject 30-45 Units into the skin 3 (three) times daily with meals.    . insulin detemir (LEVEMIR) 100 UNIT/ML injection Inject 0.6 mLs (60 Units total) into the skin 2 (two) times daily. 10 mL 11  . metoCLOPramide (REGLAN) 5 MG tablet Take 1 tablet (5 mg total) by mouth 4 (four) times daily -  before meals and at bedtime. (Patient not taking:  Reported on 07/06/2014) 120 tablet 0  . metoprolol (LOPRESSOR) 100 MG tablet Take 100 mg by mouth 2 (two) times daily.    . metoprolol (LOPRESSOR) 50 MG tablet Take 50 mg by mouth 2 (two) times daily.    . mupirocin ointment (BACTROBAN) 2 % To left shin 22 g 0  . nystatin cream (MYCOSTATIN) Apply 1 application topically 3 (three) times daily.    . Probiotic Product (TRUBIOTICS PO) Take 1 tablet by mouth every morning.    . Pyridoxine HCl (B-6 PO) Take 1 tablet by mouth every morning.    . saccharomyces boulardii (FLORASTOR) 250 MG capsule Take 1 capsule (250 mg total) by mouth 2 (two) times daily. 30 capsule 0   No current facility-administered medications on file prior to visit.    Allergies  Allergen Reactions  . Bactrim [Sulfamethoxazole-Trimethoprim] Swelling    "tongue swelling"  . Ibuprofen     Kidney issues    No results found for this or any previous visit (from the past 2160 hour(s)).  Objective: There were no vitals filed for this visit.  General: Patient is awake, alert, oriented x 3 and in no acute distress.  Physical exam:To left lateral foot there is a full thickness ulceration with a mix of fibro-granular tissue that probes deep down to soft tissue end range that measures 2 cm x 0.5 cm x 0.3 cm, improving in size with decreased erythema. Decreased edema. Mild bloody drainage, no other acute  signs of infection. Mild abrasions on anterior shin with ruptured blisters that appear to be venous stasis on left. No Pain with palpation to left foot. Capillary fill intact to all toes. No pain with compression to calves bilateral. 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   Assessment and Plan:  Problem List Items Addressed This Visit    None    Visit Diagnoses    Diabetic ulcer of left midfoot associated with type 2 diabetes mellitus, with fat layer exposed ([place])    -  Primary   Diabetic polyneuropathy associated with type 2 diabetes mellitus ([place])       Left foot pain        History of osteomyelitis       Cellulitis and abscess of foot, except toes         -Examined patient and discussed the progression of the wound and treatment alternatives. -Applied Prisma to left lateral wound and Adaptic to anterior shin on left; orders sent to [facility] and rehabilitation to perform wound care daily consisting of the same with betadine at the pheriphery if needed for maceration. -Continue with Bactroban cream to use at shin -Continue with Nonweightbearing; Advised patient to stay compliant with this status with use of wheelchair. May do nonweightbearing exercise with PT. May consider allowing Patient and next visit to start weightbearing with physical therapy. -Continue with postoperative shoe and offloading padding at the left foot for protection -Continue with elevation when sitting  -Patient to return to office in 2 weeks for continued post op wound care.   Landis Martins, DPM

## 2017-06-27 ENCOUNTER — Telehealth: Payer: Self-pay | Admitting: Sports Medicine

## 2017-06-27 MED ORDER — CLINDAMYCIN HCL 300 MG PO CAPS: 300.0000 mg | ORAL_CAPSULE | Freq: Three times a day (TID) | ORAL | 0 refills | Status: DC

## 2017-06-27 NOTE — Telephone Encounter (Signed)
Hi, this is Kristopher Simon with [facility]. I've been doing wound care on his surgical site left lateral foot and he also has some cellulitis on his left shin. Now he has some redness and warmth to the top of the left foot that wraps around to the left lateral side of his foot to the outer wound margins. I know he has an appointment this week but I wanted to know if Dr. Cannon Kettle wanted me to do something different between now and his appointment. Please call me back at [phone]. Thanks.

## 2017-06-27 NOTE — Addendum Note (Signed)
Addended by: Harriett Sine D on: 06/27/2017 04:13 PM   Modules accepted: Orders

## 2017-06-27 NOTE — Telephone Encounter (Addendum)
I gave pt Dr. Leeanne Rio orders and he states he doesn't think it is cellulitis, it is not hot and [place] thinks it is the sodium intake. I told pt Dr. Cannon Kettle wanted him on the Clindamycin, and compression wrap, collagen and adaptic to the left shin, and to keep his appt 06/30/2017. Pt states understanding. I spoke with Carlynn Purl, LPN and gave Dr. Leeanne Rio orders.

## 2017-06-27 NOTE — Telephone Encounter (Signed)
Have them resume PO antibiotic. Recommend Clindamycin and make sure he is elevating and using compression with ACE for swelling. Continue collagen dressing to wound and adaptic to shin Thanks Dr. Cannon Kettle

## 2017-06-30 ENCOUNTER — Ambulatory Visit (INDEPENDENT_AMBULATORY_CARE_PROVIDER_SITE_OTHER): Payer: Self-pay | Admitting: Sports Medicine

## 2017-06-30 ENCOUNTER — Encounter: Payer: Self-pay | Admitting: Sports Medicine

## 2017-06-30 DIAGNOSIS — Z8739 Personal history of other diseases of the musculoskeletal system and connective tissue: Secondary | ICD-10-CM

## 2017-06-30 DIAGNOSIS — E1142 Type 2 diabetes mellitus with diabetic polyneuropathy: Secondary | ICD-10-CM

## 2017-06-30 DIAGNOSIS — M79672 Pain in left foot: Secondary | ICD-10-CM

## 2017-06-30 DIAGNOSIS — L02619 Cutaneous abscess of unspecified foot: Secondary | ICD-10-CM

## 2017-06-30 DIAGNOSIS — M10072 Idiopathic gout, left ankle and foot: Secondary | ICD-10-CM

## 2017-06-30 DIAGNOSIS — E11621 Type 2 diabetes mellitus with foot ulcer: Secondary | ICD-10-CM

## 2017-06-30 DIAGNOSIS — L97422 Non-pressure chronic ulcer of left heel and midfoot with fat layer exposed: Secondary | ICD-10-CM

## 2017-06-30 DIAGNOSIS — L03119 Cellulitis of unspecified part of limb: Secondary | ICD-10-CM

## 2017-06-30 MED ORDER — COLCHICINE 0.6 MG PO TABS: 0.6000 mg | ORAL_TABLET | Freq: Every day | ORAL | 0 refills | Status: DC

## 2017-06-30 MED ORDER — HYDROCORTISONE 0.5 % EX CREA: 1.0000 "application " | TOPICAL_CREAM | Freq: Two times a day (BID) | CUTANEOUS | 0 refills | Status: DC

## 2017-06-30 NOTE — Progress Notes (Signed)
Subjective: Kristopher Simon is a 51 y.o. male patient seen in office for POV #8 s/p Left foot incision and drainage with removal of bone and placement of antibiotic beads  (Date of surgery 02-20-17). Patient is at [facility]. Patient reports that he is doing good. States they started him back on clindamycin for redness and swelling in foot and leg. Denies vomiting/chills/night sweats/shortness of breath/pain. Patient has no other pedal complaints at this time.  Patient Active Problem List   Diagnosis Date Noted  . DKA (diabetic ketoacidoses) ([place]) 07/06/2014  . Cellulitis of left lower extremity 07/06/2014  . Tachycardia with 100 - 120 beats per minute 07/06/2014  . GERD (gastroesophageal reflux disease) 07/06/2014  . Essential hypertension 07/06/2014  . Hyponatremia 07/06/2014  . CAD (coronary artery disease), native coronary artery 07/06/2014  . Acute kidney injury ([place]) 02/05/2012  . Diabetes mellitus ([place]) 02/05/2012  . HTN (hypertension) 02/05/2012  . Nausea & vomiting 02/05/2012  . Shortness of breath 02/05/2012   Current Outpatient Medications on File Prior to Visit  Medication Sig Dispense Refill  . aspirin EC 81 MG tablet Take 81 mg by mouth daily.    Marland Kitchen CINNAMON PO Take 1 tablet by mouth every morning.    . clindamycin (CLEOCIN) 300 MG capsule Take 1 capsule (300 mg total) 3 (three) times daily by mouth. 30 capsule 0  . cyclobenzaprine (FLEXERIL) 10 MG tablet Take 10-20 mg by mouth 2 (two) times daily. 10mg  in the morning and 20mg  at bedtime    . esomeprazole (NEXIUM) 40 MG capsule Take 40 mg by mouth every morning.    . gabapentin (NEURONTIN) 300 MG capsule Take 300 mg by mouth 3 (three) times daily.    . hydrochlorothiazide (HYDRODIURIL) 25 MG tablet Take 25 mg by mouth daily.    . insulin aspart (NOVOLOG FLEXPEN) 100 UNIT/ML FlexPen Inject 30-45 Units into the skin 3 (three) times daily with meals.    . insulin detemir (LEVEMIR) 100 UNIT/ML injection Inject 0.6 mLs (60 Units  total) into the skin 2 (two) times daily. 10 mL 11  . metoCLOPramide (REGLAN) 5 MG tablet Take 1 tablet (5 mg total) by mouth 4 (four) times daily -  before meals and at bedtime. (Patient not taking: Reported on 07/06/2014) 120 tablet 0  . metoprolol (LOPRESSOR) 100 MG tablet Take 100 mg by mouth 2 (two) times daily.    . metoprolol (LOPRESSOR) 50 MG tablet Take 50 mg by mouth 2 (two) times daily.    . mupirocin ointment (BACTROBAN) 2 % To left shin 22 g 0  . nystatin cream (MYCOSTATIN) Apply 1 application topically 3 (three) times daily.    . Probiotic Product (TRUBIOTICS PO) Take 1 tablet by mouth every morning.    . Pyridoxine HCl (B-6 PO) Take 1 tablet by mouth every morning.    . saccharomyces boulardii (FLORASTOR) 250 MG capsule Take 1 capsule (250 mg total) by mouth 2 (two) times daily. 30 capsule 0   No current facility-administered medications on file prior to visit.    Allergies  Allergen Reactions  . Bactrim [Sulfamethoxazole-Trimethoprim] Swelling    "tongue swelling"  . Ibuprofen     Kidney issues    No results found for this or any previous visit (from the past 2160 hour(s)).  Objective: There were no vitals filed for this visit.  General: Patient is awake, alert, oriented x 3 and in no acute distress.  Physical exam:To left lateral foot there is a full thickness ulceration with a  mix of fibro-granular tissue that probes deep down to soft tissue end range that measures 1.5 cm x 0.5 cm x 0.3 cm, improving in size with decreased erythema. Decreased edema. Mild warmth. Mild bloody drainage, no other acute signs of infection. Mild abrasions on anterior shin with ruptured blisters that appear to be venous stasis on left with mild venous skin irritation. No Pain with palpation to left foot. Capillary fill intact to all toes. No pain with compression to calves bilateral. 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   Assessment and Plan:  Problem List Items Addressed This  Visit    None    Visit Diagnoses    Diabetic ulcer of left midfoot associated with type 2 diabetes mellitus, with fat layer exposed ([place])    -  Primary   Diabetic polyneuropathy associated with type 2 diabetes mellitus ([place])       Left foot pain       History of osteomyelitis       Cellulitis and abscess of foot, except toes       Acute idiopathic gout of left foot       Possible   Relevant Medications   colchicine 0.6 MG tablet   Other Relevant Orders   Uric Acid     -Examined patient and discussed the progression of the wound and treatment alternatives. -Applied Prisma to left lateral wound and Adaptic to anterior shin on left; orders sent to [facility] and rehabilitation to perform wound care daily consisting of the same with betadine at the pheriphery if needed for maceration and hydrocortisone cream to irritated areas. -Continue with Bactroban cream to use at shin -Rx Uric Acid level and recommend colchicine because patient could also be suffering with gout at surgical site. -Allowed for partial weightbearing; Advised patient to stay compliant with this status with use of wheelchair for longer distance. May do partial weightbearing exercise with PT. May consider allowing Patient and next visit to start FULL weightbearing with physical therapy. -Continue with postoperative shoe and offloading padding at the left foot for protection -Continue with elevation when sitting  -Patient to return to office in 2 weeks for continued post op wound care.   Landis Martins, DPM

## 2017-06-30 NOTE — Patient Instructions (Signed)

## 2017-07-19 ENCOUNTER — Encounter: Payer: Self-pay | Admitting: Sports Medicine

## 2017-07-19 ENCOUNTER — Ambulatory Visit (INDEPENDENT_AMBULATORY_CARE_PROVIDER_SITE_OTHER): Payer: Self-pay | Admitting: Sports Medicine

## 2017-07-19 DIAGNOSIS — E1142 Type 2 diabetes mellitus with diabetic polyneuropathy: Secondary | ICD-10-CM

## 2017-07-19 DIAGNOSIS — Z8739 Personal history of other diseases of the musculoskeletal system and connective tissue: Secondary | ICD-10-CM

## 2017-07-19 DIAGNOSIS — M79672 Pain in left foot: Secondary | ICD-10-CM

## 2017-07-19 DIAGNOSIS — E11621 Type 2 diabetes mellitus with foot ulcer: Secondary | ICD-10-CM

## 2017-07-19 DIAGNOSIS — L97422 Non-pressure chronic ulcer of left heel and midfoot with fat layer exposed: Secondary | ICD-10-CM

## 2017-07-19 NOTE — Progress Notes (Signed)
Subjective: Kristopher Simon is a 51 y.o. male patient seen in office for POV #9 s/p Left foot incision and drainage with removal of bone and placement of antibiotic beads  (Date of surgery 02-20-17). Patient is at [facility]. Patient reports that he is doing good, ready to do more, PT will not let him do anything until he is full weightbearing. Denies vomiting/chills/night sweats/shortness of breath/pain. Patient has no other pedal complaints at this time.  Patient Active Problem List   Diagnosis Date Noted  . DKA (diabetic ketoacidoses) ([place]) 07/06/2014  . Cellulitis of left lower extremity 07/06/2014  . Tachycardia with 100 - 120 beats per minute 07/06/2014  . GERD (gastroesophageal reflux disease) 07/06/2014  . Essential hypertension 07/06/2014  . Hyponatremia 07/06/2014  . CAD (coronary artery disease), native coronary artery 07/06/2014  . Acute kidney injury ([place]) 02/05/2012  . Diabetes mellitus ([place]) 02/05/2012  . HTN (hypertension) 02/05/2012  . Nausea & vomiting 02/05/2012  . Shortness of breath 02/05/2012   Current Outpatient Medications on File Prior to Visit  Medication Sig Dispense Refill  . aspirin EC 81 MG tablet Take 81 mg by mouth daily.    Marland Kitchen CINNAMON PO Take 1 tablet by mouth every morning.    . clindamycin (CLEOCIN) 300 MG capsule Take 1 capsule (300 mg total) 3 (three) times daily by mouth. 30 capsule 0  . colchicine 0.6 MG tablet Take 1 tablet (0.6 mg total) daily by mouth. 7 tablet 0  . cyclobenzaprine (FLEXERIL) 10 MG tablet Take 10-20 mg by mouth 2 (two) times daily. 10mg  in the morning and 20mg  at bedtime    . esomeprazole (NEXIUM) 40 MG capsule Take 40 mg by mouth every morning.    . gabapentin (NEURONTIN) 300 MG capsule Take 300 mg by mouth 3 (three) times daily.    . hydrochlorothiazide (HYDRODIURIL) 25 MG tablet Take 25 mg by mouth daily.    . hydrocortisone cream 0.5 % Apply 1 application 2 (two) times daily topically. To red areas on leg as needed 30 g 0  .  insulin aspart (NOVOLOG FLEXPEN) 100 UNIT/ML FlexPen Inject 30-45 Units into the skin 3 (three) times daily with meals.    . insulin detemir (LEVEMIR) 100 UNIT/ML injection Inject 0.6 mLs (60 Units total) into the skin 2 (two) times daily. 10 mL 11  . metoCLOPramide (REGLAN) 5 MG tablet Take 1 tablet (5 mg total) by mouth 4 (four) times daily -  before meals and at bedtime. (Patient not taking: Reported on 07/06/2014) 120 tablet 0  . metoprolol (LOPRESSOR) 100 MG tablet Take 100 mg by mouth 2 (two) times daily.    . metoprolol (LOPRESSOR) 50 MG tablet Take 50 mg by mouth 2 (two) times daily.    . mupirocin ointment (BACTROBAN) 2 % To left shin 22 g 0  . nystatin cream (MYCOSTATIN) Apply 1 application topically 3 (three) times daily.    . Probiotic Product (TRUBIOTICS PO) Take 1 tablet by mouth every morning.    . Pyridoxine HCl (B-6 PO) Take 1 tablet by mouth every morning.    . saccharomyces boulardii (FLORASTOR) 250 MG capsule Take 1 capsule (250 mg total) by mouth 2 (two) times daily. 30 capsule 0   No current facility-administered medications on file prior to visit.    Allergies  Allergen Reactions  . Bactrim [Sulfamethoxazole-Trimethoprim] Swelling    "tongue swelling"  . Ibuprofen     Kidney issues    No results found for this or any previous visit (  from the past 2160 hour(s)).  Objective: There were no vitals filed for this visit.  General: Patient is awake, alert, oriented x 3 and in no acute distress.  Physical exam:To left lateral foot there is a partial thickness ulceration with a mix of fibro-granular tissue that no longer probes deep down to soft tissue end range that measures 0.5 cm x 0.3 cm x 0.1 cm, improving in size with decreased erythema. Decreased edema. No warmth. Scant bloody drainage, no other acute signs of infection. Mild abrasions on anterior shin with ruptured blisters that appear to be venous stasis on left with mild venous skin irritation. No Pain with  palpation to left foot. Capillary fill intact to all toes. No pain with compression to calves bilateral. 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   Assessment and Plan:  Problem List Items Addressed This Visit    None    Visit Diagnoses    Diabetic ulcer of left midfoot associated with type 2 diabetes mellitus, with fat layer exposed ([place])    -  Primary   Diabetic polyneuropathy associated with type 2 diabetes mellitus ([place])       Left foot pain       History of osteomyelitis         -Examined patient and discussed the progression of the wound and treatment alternatives. -Applied Prisma to left lateral wound and Adaptic to anterior shin on left; orders sent to [facility] and rehabilitation to perform wound care daily consisting of the same with betadine at the pheriphery if needed for maceration and hydrocortisone cream to irritated areas. -Continue with Bactroban cream to use at shin -May start FULL weightbearing with physical therapy to condition patient for discharge before christmas -Continue with postoperative shoe and offloading padding at the left foot for protection when attempting ambulation; Once patient is discharged will see if we can work with getting the patient assessed for custom shoes -Continue with elevation when sitting  -Patient to return to office in 2-3 weeks for continued wound care.   Landis Martins, DPM

## 2017-08-04 ENCOUNTER — Ambulatory Visit (INDEPENDENT_AMBULATORY_CARE_PROVIDER_SITE_OTHER): Payer: Self-pay | Admitting: Sports Medicine

## 2017-08-04 ENCOUNTER — Encounter: Payer: Self-pay | Admitting: Sports Medicine

## 2017-08-04 DIAGNOSIS — M79672 Pain in left foot: Secondary | ICD-10-CM

## 2017-08-04 DIAGNOSIS — E1142 Type 2 diabetes mellitus with diabetic polyneuropathy: Secondary | ICD-10-CM

## 2017-08-04 DIAGNOSIS — L97422 Non-pressure chronic ulcer of left heel and midfoot with fat layer exposed: Secondary | ICD-10-CM

## 2017-08-04 DIAGNOSIS — E11621 Type 2 diabetes mellitus with foot ulcer: Secondary | ICD-10-CM

## 2017-08-04 DIAGNOSIS — L309 Dermatitis, unspecified: Secondary | ICD-10-CM

## 2017-08-04 DIAGNOSIS — Z8739 Personal history of other diseases of the musculoskeletal system and connective tissue: Secondary | ICD-10-CM

## 2017-08-04 MED ORDER — CLOBETASOL PROPIONATE 0.05 % EX CREA: 1.0000 "application " | TOPICAL_CREAM | Freq: Every day | CUTANEOUS | 0 refills | Status: DC

## 2017-08-04 NOTE — Progress Notes (Signed)
Subjective: Kristopher Simon is a 51 y.o. male patient seen in office for POV #10 s/p Left foot incision and drainage with removal of bone and placement of antibiotic beads  (Date of surgery 02-20-17). Patient was discharged today from [facility] rehab. Patient reports that he has some swelling redness and pain however he has been doing a lot today with moving and packing his things, denies vomiting/chills/night sweats/shortness of breath/pain. Patient has no other pedal complaints at this time.  Patient Active Problem List   Diagnosis Date Noted  . DKA (diabetic ketoacidoses) ([place]) 07/06/2014  . Cellulitis of left lower extremity 07/06/2014  . Tachycardia with 100 - 120 beats per minute 07/06/2014  . GERD (gastroesophageal reflux disease) 07/06/2014  . Essential hypertension 07/06/2014  . Hyponatremia 07/06/2014  . CAD (coronary artery disease), native coronary artery 07/06/2014  . Acute kidney injury ([place]) 02/05/2012  . Diabetes mellitus ([place]) 02/05/2012  . HTN (hypertension) 02/05/2012  . Nausea & vomiting 02/05/2012  . Shortness of breath 02/05/2012   Current Outpatient Medications on File Prior to Visit  Medication Sig Dispense Refill  . aspirin EC 81 MG tablet Take 81 mg by mouth daily.    Marland Kitchen CINNAMON PO Take 1 tablet by mouth every morning.    . clindamycin (CLEOCIN) 300 MG capsule Take 1 capsule (300 mg total) 3 (three) times daily by mouth. 30 capsule 0  . colchicine 0.6 MG tablet Take 1 tablet (0.6 mg total) daily by mouth. 7 tablet 0  . cyclobenzaprine (FLEXERIL) 10 MG tablet Take 10-20 mg by mouth 2 (two) times daily. 10mg  in the morning and 20mg  at bedtime    . esomeprazole (NEXIUM) 40 MG capsule Take 40 mg by mouth every morning.    . gabapentin (NEURONTIN) 300 MG capsule Take 300 mg by mouth 3 (three) times daily.    . hydrochlorothiazide (HYDRODIURIL) 25 MG tablet Take 25 mg by mouth daily.    . hydrocortisone cream 0.5 % Apply 1 application 2 (two) times daily topically. To red  areas on leg as needed 30 g 0  . insulin aspart (NOVOLOG FLEXPEN) 100 UNIT/ML FlexPen Inject 30-45 Units into the skin 3 (three) times daily with meals.    . insulin detemir (LEVEMIR) 100 UNIT/ML injection Inject 0.6 mLs (60 Units total) into the skin 2 (two) times daily. 10 mL 11  . metoCLOPramide (REGLAN) 5 MG tablet Take 1 tablet (5 mg total) by mouth 4 (four) times daily -  before meals and at bedtime. (Patient not taking: Reported on 07/06/2014) 120 tablet 0  . metoprolol (LOPRESSOR) 100 MG tablet Take 100 mg by mouth 2 (two) times daily.    . metoprolol (LOPRESSOR) 50 MG tablet Take 50 mg by mouth 2 (two) times daily.    . mupirocin ointment (BACTROBAN) 2 % To left shin 22 g 0  . nystatin cream (MYCOSTATIN) Apply 1 application topically 3 (three) times daily.    . Probiotic Product (TRUBIOTICS PO) Take 1 tablet by mouth every morning.    . Pyridoxine HCl (B-6 PO) Take 1 tablet by mouth every morning.    . saccharomyces boulardii (FLORASTOR) 250 MG capsule Take 1 capsule (250 mg total) by mouth 2 (two) times daily. 30 capsule 0   No current facility-administered medications on file prior to visit.    Allergies  Allergen Reactions  . Bactrim [Sulfamethoxazole-Trimethoprim] Swelling    "tongue swelling"  . Ibuprofen     Kidney issues    No results found for this or  any previous visit (from the past 2160 hour(s)).  Objective: There were no vitals filed for this visit.  General: Patient is awake, alert, oriented x 3 and in no acute distress.  Physical exam:To left lateral foot the previous ulceration has healed over.  Abrasions to anterior shin has resolved and now appears to be a patch of rash with inflammation + edema. No warmth. + venous stasis on left with mild venous skin irritation/rash. No Pain with palpation to left foot. Capillary fill intact to all toes. No pain with compression to calves bilateral. Pes cavus and varus foot and ankle deformity L>R with subjective pain with  rotation of the ankle.   Assessment and Plan:  Problem List Items Addressed This Visit    None    Visit Diagnoses    Diabetic ulcer of left midfoot associated with type 2 diabetes mellitus, with fat layer exposed ([place])    -  Primary   Healed   Dermatitis       Relevant Medications   clobetasol cream (TEMOVATE) 0.05 %   Diabetic polyneuropathy associated with type 2 diabetes mellitus ([facility])       History of osteomyelitis       Left foot pain         -Examined patient and discussed the progression of the healed wound and treatment alternatives for rash. -No dressings are needed since left foot ulcer is healed -Applied Surgitube compression sleeve on left leg to assist with edema control -Prescribed clobetasol cream for patient to use along area of rash and irritation on anterior shin on the left advised patient if this medication is too expensive then he can use over-the-counter cortisone cream and over-the-counter Lamisil cream together -Continue with good supportive tennis shoes -At next visit will see if we can work with getting the patient assessed for custom shoes; [employer] -Continue with elevation when sitting to assist with edema control -Patient to return to office in 3 weeks for wound and rash check.    Landis Martins, DPM

## 2017-08-11 ENCOUNTER — Encounter: Payer: Self-pay | Admitting: Sports Medicine

## 2017-08-11 ENCOUNTER — Telehealth: Payer: Self-pay | Admitting: Sports Medicine

## 2017-08-11 ENCOUNTER — Ambulatory Visit (INDEPENDENT_AMBULATORY_CARE_PROVIDER_SITE_OTHER): Payer: Self-pay | Admitting: Sports Medicine

## 2017-08-11 DIAGNOSIS — Z8739 Personal history of other diseases of the musculoskeletal system and connective tissue: Secondary | ICD-10-CM

## 2017-08-11 DIAGNOSIS — L309 Dermatitis, unspecified: Secondary | ICD-10-CM

## 2017-08-11 DIAGNOSIS — E1142 Type 2 diabetes mellitus with diabetic polyneuropathy: Secondary | ICD-10-CM

## 2017-08-11 DIAGNOSIS — S93402A Sprain of unspecified ligament of left ankle, initial encounter: Secondary | ICD-10-CM

## 2017-08-11 DIAGNOSIS — M79672 Pain in left foot: Secondary | ICD-10-CM

## 2017-08-11 DIAGNOSIS — M10072 Idiopathic gout, left ankle and foot: Secondary | ICD-10-CM

## 2017-08-11 MED ORDER — METHYLPREDNISOLONE 4 MG PO TBPK: ORAL_TABLET | ORAL | 0 refills | Status: DC

## 2017-08-11 MED ORDER — HYDROMORPHONE HCL 4 MG PO TABS: 4.0000 mg | ORAL_TABLET | Freq: Four times a day (QID) | ORAL | 0 refills | Status: DC | PRN

## 2017-08-11 NOTE — Telephone Encounter (Signed)
Call and offer appointment at 2:45 this afternoon

## 2017-08-11 NOTE — Progress Notes (Signed)
Subjective: Ozell Juhasz is a 51 y.o. male patient seen in office for POV #11 s/p Left foot incision and drainage with removal of bone and placement of antibiotic beads  (Date of surgery 02-20-17). Patient states that he is in extreme pain. 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 Pain is so bad that it makes him sweat, denies vomiting/chills/shortness of breath. Patient has no other pedal complaints at this time.  Patient Active Problem List   Diagnosis Date Noted  . DKA (diabetic ketoacidoses) ([place]) 07/06/2014  . Cellulitis of left lower extremity 07/06/2014  . Tachycardia with 100 - 120 beats per minute 07/06/2014  . GERD (gastroesophageal reflux disease) 07/06/2014  . Essential hypertension 07/06/2014  . Hyponatremia 07/06/2014  . CAD (coronary artery disease), native coronary artery 07/06/2014  . Acute kidney injury ([place]) 02/05/2012  . Diabetes mellitus ([place]) 02/05/2012  . HTN (hypertension) 02/05/2012  . Nausea & vomiting 02/05/2012  . Shortness of breath 02/05/2012   Current Outpatient Medications on File Prior to Visit  Medication Sig Dispense Refill  . aspirin EC 81 MG tablet Take 81 mg by mouth daily.    Marland Kitchen CINNAMON PO Take 1 tablet by mouth every morning.    . clindamycin (CLEOCIN) 300 MG capsule Take 1 capsule (300 mg total) 3 (three) times daily by mouth. 30 capsule 0  . clobetasol cream (TEMOVATE) 8.33 % Apply 1 application topically daily. Daily at bedtime 60 g 0  . colchicine 0.6 MG tablet Take 1 tablet (0.6 mg total) daily by mouth. 7 tablet 0  . cyclobenzaprine (FLEXERIL) 10 MG tablet Take 10-20 mg by mouth 2 (two) times daily. 10mg  in the morning and 20mg  at bedtime    . esomeprazole (NEXIUM) 40 MG capsule Take 40 mg by mouth every morning.    . gabapentin (NEURONTIN) 300 MG capsule Take 300 mg by mouth 3 (three) times daily.    . hydrochlorothiazide (HYDRODIURIL) 25 MG tablet Take 25 mg by mouth daily.    .  hydrocortisone cream 0.5 % Apply 1 application 2 (two) times daily topically. To red areas on leg as needed 30 g 0  . insulin aspart (NOVOLOG FLEXPEN) 100 UNIT/ML FlexPen Inject 30-45 Units into the skin 3 (three) times daily with meals.    . insulin detemir (LEVEMIR) 100 UNIT/ML injection Inject 0.6 mLs (60 Units total) into the skin 2 (two) times daily. 10 mL 11  . metoCLOPramide (REGLAN) 5 MG tablet Take 1 tablet (5 mg total) by mouth 4 (four) times daily -  before meals and at bedtime. (Patient not taking: Reported on 07/06/2014) 120 tablet 0  . metoprolol (LOPRESSOR) 100 MG tablet Take 100 mg by mouth 2 (two) times daily.    . metoprolol (LOPRESSOR) 50 MG tablet Take 50 mg by mouth 2 (two) times daily.    . mupirocin ointment (BACTROBAN) 2 % To left shin 22 g 0  . nystatin cream (MYCOSTATIN) Apply 1 application topically 3 (three) times daily.    . Probiotic Product (TRUBIOTICS PO) Take 1 tablet by mouth every morning.    . Pyridoxine HCl (B-6 PO) Take 1 tablet by mouth every morning.    . saccharomyces boulardii (FLORASTOR) 250 MG capsule Take 1 capsule (250 mg total) by mouth 2 (two) times daily. 30 capsule 0   No current facility-administered medications on file prior to visit.    Allergies  Allergen Reactions  . Bactrim [Sulfamethoxazole-Trimethoprim] Swelling    "  tongue swelling"  . Ibuprofen     Kidney issues    No results found for this or any previous visit (from the past 2160 hour(s)).  Objective: There were no vitals filed for this visit.  General: Patient is awake, alert, oriented x 3 and in no acute distress.  Physical exam:To left lateral foot the previous ulceration remains healed over.  Abrasions to anterior shin has resolved and now appears to be a patch of rash with inflammation + edema. Mild warmth. + venous stasis on left with mild venous skin irritation/rash. ++ Pain to lateral ankle and to heel on left, Capillary fill intact to all toes. No pain with compression  to calves bilateral. Pes cavus and varus foot and ankle deformity L>R with subjective pain with rotation of the ankle or when putting weight on his foot.   Assessment and Plan:  Problem List Items Addressed This Visit    None    Visit Diagnoses    Diabetic polyneuropathy associated with type 2 diabetes mellitus ([place])    -  Primary   Dermatitis       Left foot pain       Acute idiopathic gout of left foot       Relevant Medications   HYDROmorphone (DILAUDID) 4 MG tablet   methylPREDNISolone (MEDROL DOSEPAK) 4 MG TBPK tablet   Moderate ankle sprain, left, initial encounter       History of osteomyelitis         -Examined patient  -Discussed patient likelihood of overuse with sprain -Applied Unna boot to left with instructions to keep in place for 5 days and then after removes use compression sleeve -Rx Dilaudid for severe pain -Rx Medrol dose pak for inflammation vs possible gout and advised patient that his sugar will go up while on steroids  -Recommend patient to rest, ice, elevate and to get a cane or walker for stability to help keep pressure off his foot -Advised patient to refrain from walking barefoot or without shoes  -Rx for BIOTECH given for patient to go for assessment after he has removed soft cast -Continue with elevation when sitting to assist with edema control -Patient to return to office in 2 weeks for follow up on sprain if no better after soft cast will xray   Landis Martins, DPM

## 2017-08-11 NOTE — Telephone Encounter (Signed)
My foot is hurting me really bad. I don't know what I've done to it but its now got to where I can't even walk on it. I walk on it just a little ways and it kills me. I just didn't know what Dr. Cannon Kettle wanted me to do. If you can get back in touch with me as soon as possible at [phone]. Thanks so much.

## 2017-08-25 ENCOUNTER — Telehealth: Payer: Self-pay | Admitting: *Deleted

## 2017-08-25 ENCOUNTER — Ambulatory Visit (INDEPENDENT_AMBULATORY_CARE_PROVIDER_SITE_OTHER): Payer: Self-pay | Admitting: Sports Medicine

## 2017-08-25 ENCOUNTER — Encounter: Payer: Self-pay | Admitting: Sports Medicine

## 2017-08-25 ENCOUNTER — Ambulatory Visit (INDEPENDENT_AMBULATORY_CARE_PROVIDER_SITE_OTHER): Payer: Self-pay

## 2017-08-25 DIAGNOSIS — S82899A Other fracture of unspecified lower leg, initial encounter for closed fracture: Secondary | ICD-10-CM

## 2017-08-25 DIAGNOSIS — M779 Enthesopathy, unspecified: Secondary | ICD-10-CM

## 2017-08-25 DIAGNOSIS — M79672 Pain in left foot: Secondary | ICD-10-CM

## 2017-08-25 DIAGNOSIS — S82892A Other fracture of left lower leg, initial encounter for closed fracture: Secondary | ICD-10-CM

## 2017-08-25 DIAGNOSIS — L03116 Cellulitis of left lower limb: Secondary | ICD-10-CM

## 2017-08-25 DIAGNOSIS — E1142 Type 2 diabetes mellitus with diabetic polyneuropathy: Secondary | ICD-10-CM

## 2017-08-25 MED ORDER — HYDROMORPHONE HCL 4 MG PO TABS: 4.0000 mg | ORAL_TABLET | Freq: Four times a day (QID) | ORAL | 0 refills | Status: DC | PRN

## 2017-08-25 MED ORDER — CLINDAMYCIN HCL 300 MG PO CAPS: 300.0000 mg | ORAL_CAPSULE | Freq: Three times a day (TID) | ORAL | 0 refills | Status: DC

## 2017-08-25 NOTE — Telephone Encounter (Signed)
Kristopher Simon [facility] scheduled pt 08/29/2017 arrive at 9:30am for 10:00am appt. Faxed orders to [place]. Left message informing pt of the appt time.

## 2017-08-25 NOTE — Telephone Encounter (Signed)
-----   Message from Landis Martins, [place] sent at 08/25/2017 11:22 AM EST ----- Regarding: CT scan  Patient has charity care at [place] can we see if we can get a CT of the ankle. Patient has a fracture of the fibula but hard to completely see due to body habitus. Need CT for further evaluation at Left ankle Thanks Dr. Chauncey Cruel

## 2017-08-25 NOTE — Progress Notes (Signed)
Subjective: Kristopher Simon is a 52 y.o. male patient seen in office for POV #12 s/p Left foot incision and drainage with removal of bone and placement of antibiotic beads  (Date of surgery 02-20-17). Patient states that he is in extreme pain and that it has not gotten any better since last visit.  Patient has been using a walker which helps some however with any steps or pressure to the foot and ankle pain is very extreme, reports that the Dilaudid helps, denies vomiting/chills/shortness of breath. Patient has no other pedal complaints at this time.  Patient Active Problem List   Diagnosis Date Noted  . DKA (diabetic ketoacidoses) ([place]) 07/06/2014  . Cellulitis of left lower extremity 07/06/2014  . Tachycardia with 100 - 120 beats per minute 07/06/2014  . GERD (gastroesophageal reflux disease) 07/06/2014  . Essential hypertension 07/06/2014  . Hyponatremia 07/06/2014  . CAD (coronary artery disease), native coronary artery 07/06/2014  . Acute kidney injury ([place]) 02/05/2012  . Diabetes mellitus ([place]) 02/05/2012  . HTN (hypertension) 02/05/2012  . Nausea & vomiting 02/05/2012  . Shortness of breath 02/05/2012   Current Outpatient Medications on File Prior to Visit  Medication Sig Dispense Refill  . aspirin EC 81 MG tablet Take 81 mg by mouth daily.    Marland Kitchen CINNAMON PO Take 1 tablet by mouth every morning.    . clobetasol cream (TEMOVATE) 1.06 % Apply 1 application topically daily. Daily at bedtime 60 g 0  . colchicine 0.6 MG tablet Take 1 tablet (0.6 mg total) daily by mouth. 7 tablet 0  . cyclobenzaprine (FLEXERIL) 10 MG tablet Take 10-20 mg by mouth 2 (two) times daily. 10mg  in the morning and 20mg  at bedtime    . esomeprazole (NEXIUM) 40 MG capsule Take 40 mg by mouth every morning.    . gabapentin (NEURONTIN) 300 MG capsule Take 300 mg by mouth 3 (three) times daily.    . hydrochlorothiazide (HYDRODIURIL) 25 MG tablet Take 25 mg by mouth daily.    . hydrocortisone cream 0.5 % Apply 1  application 2 (two) times daily topically. To red areas on leg as needed 30 g 0  . insulin aspart (NOVOLOG FLEXPEN) 100 UNIT/ML FlexPen Inject 30-45 Units into the skin 3 (three) times daily with meals.    . insulin detemir (LEVEMIR) 100 UNIT/ML injection Inject 0.6 mLs (60 Units total) into the skin 2 (two) times daily. 10 mL 11  . methylPREDNISolone (MEDROL DOSEPAK) 4 MG TBPK tablet Take as instructed 21 tablet 0  . metoCLOPramide (REGLAN) 5 MG tablet Take 1 tablet (5 mg total) by mouth 4 (four) times daily -  before meals and at bedtime. (Patient not taking: Reported on 07/06/2014) 120 tablet 0  . metoprolol (LOPRESSOR) 100 MG tablet Take 100 mg by mouth 2 (two) times daily.    . metoprolol (LOPRESSOR) 50 MG tablet Take 50 mg by mouth 2 (two) times daily.    . mupirocin ointment (BACTROBAN) 2 % To left shin 22 g 0  . nystatin cream (MYCOSTATIN) Apply 1 application topically 3 (three) times daily.    . Probiotic Product (TRUBIOTICS PO) Take 1 tablet by mouth every morning.    . Pyridoxine HCl (B-6 PO) Take 1 tablet by mouth every morning.    . saccharomyces boulardii (FLORASTOR) 250 MG capsule Take 1 capsule (250 mg total) by mouth 2 (two) times daily. 30 capsule 0   No current facility-administered medications on file prior to visit.    Allergies  Allergen Reactions  .  Bactrim [Sulfamethoxazole-Trimethoprim] Swelling    "tongue swelling"  . Ibuprofen     Kidney issues    No results found for this or any previous visit (from the past 2160 hour(s)).  Objective: There were no vitals filed for this visit.  General: Patient is awake, alert, oriented x 3 and in no acute distress.  Physical exam:To left lateral foot the previous ulceration remains healed over.  Abrasions to anterior shin has resolved and now appears to be a patch of rash with inflammation + edema. Mild warmth. + venous stasis on left with mild venous skin irritation/rash. ++ Pain to lateral ankle and to heel on left,  Capillary fill intact to all toes. No pain with compression to calves bilateral. Pes cavus and varus foot and ankle deformity L>R with subjective pain with rotation of the ankle or when putting weight on his foot.   X-rays reveal a distal fracture of the fibula and postoperative changes status post midtarsal joint resection for osteomyelitis however due to patient habitus it is hard to fully evaluate the fracture at the ankle.  There is soft tissue swelling no other acute findings.  Assessment and Plan:  Problem List Items Addressed This Visit      Other   Cellulitis of left lower extremity    Other Visit Diagnoses    Ankle fracture, left, closed, initial encounter    -  Primary   Relevant Orders   DG Foot Complete Left   Left foot pain       Diabetic polyneuropathy associated with type 2 diabetes mellitus ([place])         -Examined patient  -Discussed patient continue pain secondary to ankle fracture -Applied Jones compression wrap to left with instructions to keep in place for 5 days and then after removes use compression sleeve -Refilled Dilaudid for severe pain -Rx clindamycin for preventive measures since there areas are red and warm on his leg and recommend him to use topical Bactroban -Recommend patient to rest, ice, elevate and return to using his wheelchair -Ordered CT scan will contact hospital for charity case to further evaluate ankle fracture -Patient to return to office in 2 weeks or after CT for follow up.  Landis Martins, DPM

## 2017-08-31 ENCOUNTER — Encounter: Payer: Self-pay | Admitting: Sports Medicine

## 2017-08-31 ENCOUNTER — Telehealth: Payer: Self-pay | Admitting: Sports Medicine

## 2017-08-31 ENCOUNTER — Telehealth: Payer: Self-pay | Admitting: *Deleted

## 2017-08-31 NOTE — Telephone Encounter (Signed)
I was just calling to get the results from the CT scan I had done on Tuesday. My phone number is [phone]. Thank you. Bye bye.

## 2017-08-31 NOTE — Telephone Encounter (Signed)
Returned patient call per Dr Cannon Kettle explained that we have not received a complete report and that she has reached out for more information and will let him know as soon as we have received it.

## 2017-08-31 NOTE — Telephone Encounter (Signed)
Dr. Stover please advise 

## 2017-09-01 NOTE — Telephone Encounter (Signed)
Awaiting final reading of CT. Resent to Dr. Zigmund Daniel to comment on ankle findings -Dr. Cannon Kettle

## 2017-09-04 ENCOUNTER — Telehealth: Payer: Self-pay | Admitting: Sports Medicine

## 2017-09-04 ENCOUNTER — Telehealth: Payer: Self-pay | Admitting: *Deleted

## 2017-09-04 NOTE — Telephone Encounter (Signed)
Spoke with patient gave him results he is staying off of it as much as possible.  Patient wanted to be sure he is doing the right thing since he can not find a wheelchair that will work for his size and older home.

## 2017-09-04 NOTE — Telephone Encounter (Signed)
-----   Message from Landis Martins, [place] sent at 09/04/2017  1:27 PM EST ----- Please let patient know that he has post surgical changes at the level of his foot and at the level of his ankle Severe arthritis and swelling along ankle joint tendons consistent with diabetic charcot; a state of inflammation in a diabetic that causes pain and swelling with deteriation of bone due to neuropathy. Charcot usually calms down with time and with staying off foot.  -Dr. Cannon Kettle

## 2017-09-04 NOTE — Telephone Encounter (Signed)
I'm still calling back about the results of my CT scans. I also wanted to make it aware to Dr. Cannon Kettle that I still was unable to get a wheelchair suitable for my house because of how narrow my doors are back and forth. I'm no longer non weight bearing, I've just been slowly moving around and I did take that wrap off last week. I just wanted to keep you informed of what I've been doing because I don't know what I need to be doing. So give me a call back and let me know something. My number is [phone]. Thank you. Bye.

## 2017-09-04 NOTE — Telephone Encounter (Signed)
2nd phone call addressed this call and additional questions

## 2017-09-08 ENCOUNTER — Ambulatory Visit (INDEPENDENT_AMBULATORY_CARE_PROVIDER_SITE_OTHER): Payer: Self-pay | Admitting: Sports Medicine

## 2017-09-08 ENCOUNTER — Encounter: Payer: Self-pay | Admitting: Sports Medicine

## 2017-09-08 DIAGNOSIS — M14672 Charcot's joint, left ankle and foot: Secondary | ICD-10-CM

## 2017-09-08 DIAGNOSIS — E1142 Type 2 diabetes mellitus with diabetic polyneuropathy: Secondary | ICD-10-CM

## 2017-09-08 DIAGNOSIS — M25572 Pain in left ankle and joints of left foot: Secondary | ICD-10-CM

## 2017-09-08 MED ORDER — METHYLPREDNISOLONE 4 MG PO TBPK: ORAL_TABLET | ORAL | 0 refills | Status: DC

## 2017-09-08 MED ORDER — HYDROMORPHONE HCL 4 MG PO TABS: 4.0000 mg | ORAL_TABLET | Freq: Four times a day (QID) | ORAL | 0 refills | Status: DC | PRN

## 2017-09-08 NOTE — Progress Notes (Signed)
Subjective: Jerrad Mendibles is a 52 y.o. male patient seen in office for POV #13 s/p Left foot incision and drainage with removal of bone and placement of antibiotic beads  (Date of surgery 02-20-17). Patient states that he is still in a lot of pain when flexing to scan feels like most of the pain now is at his Achilles with swelling and it really hurts to walk.  Patient states that he is not able to be completely nonweightbearing due to inability to get wheelchair through house and due to weight restrictions and for knee scooter.  Patient has been using a walker which helps some however with any steps or pressure to the foot and ankle pain is very extreme, reports that the Dilaudid helps, rest helps, ice helps, denies vomiting/chills/shortness of breath. Patient has no other pedal complaints at this time.  Patient Active Problem List   Diagnosis Date Noted  . DKA (diabetic ketoacidoses) ([place]) 07/06/2014  . Cellulitis of left lower extremity 07/06/2014  . Tachycardia with 100 - 120 beats per minute 07/06/2014  . GERD (gastroesophageal reflux disease) 07/06/2014  . Essential hypertension 07/06/2014  . Hyponatremia 07/06/2014  . CAD (coronary artery disease), native coronary artery 07/06/2014  . Acute kidney injury ([place]) 02/05/2012  . Diabetes mellitus ([place]) 02/05/2012  . HTN (hypertension) 02/05/2012  . Nausea & vomiting 02/05/2012  . Shortness of breath 02/05/2012   Current Outpatient Medications on File Prior to Visit  Medication Sig Dispense Refill  . aspirin EC 81 MG tablet Take 81 mg by mouth daily.    Marland Kitchen CINNAMON PO Take 1 tablet by mouth every morning.    . clindamycin (CLEOCIN) 300 MG capsule Take 1 capsule (300 mg total) by mouth 3 (three) times daily. 30 capsule 0  . clobetasol cream (TEMOVATE) 9.98 % Apply 1 application topically daily. Daily at bedtime 60 g 0  . colchicine 0.6 MG tablet Take 1 tablet (0.6 mg total) daily by mouth. 7 tablet 0  . cyclobenzaprine (FLEXERIL) 10 MG tablet  Take 10-20 mg by mouth 2 (two) times daily. 10mg  in the morning and 20mg  at bedtime    . esomeprazole (NEXIUM) 40 MG capsule Take 40 mg by mouth every morning.    . gabapentin (NEURONTIN) 300 MG capsule Take 300 mg by mouth 3 (three) times daily.    . hydrochlorothiazide (HYDRODIURIL) 25 MG tablet Take 25 mg by mouth daily.    . hydrocortisone cream 0.5 % Apply 1 application 2 (two) times daily topically. To red areas on leg as needed 30 g 0  . insulin aspart (NOVOLOG FLEXPEN) 100 UNIT/ML FlexPen Inject 30-45 Units into the skin 3 (three) times daily with meals.    . insulin detemir (LEVEMIR) 100 UNIT/ML injection Inject 0.6 mLs (60 Units total) into the skin 2 (two) times daily. 10 mL 11  . metoCLOPramide (REGLAN) 5 MG tablet Take 1 tablet (5 mg total) by mouth 4 (four) times daily -  before meals and at bedtime. (Patient not taking: Reported on 07/06/2014) 120 tablet 0  . metoprolol (LOPRESSOR) 100 MG tablet Take 100 mg by mouth 2 (two) times daily.    . metoprolol (LOPRESSOR) 50 MG tablet Take 50 mg by mouth 2 (two) times daily.    . mupirocin ointment (BACTROBAN) 2 % To left shin 22 g 0  . nystatin cream (MYCOSTATIN) Apply 1 application topically 3 (three) times daily.    . Probiotic Product (TRUBIOTICS PO) Take 1 tablet by mouth every morning.    Marland Kitchen  Pyridoxine HCl (B-6 PO) Take 1 tablet by mouth every morning.    . saccharomyces boulardii (FLORASTOR) 250 MG capsule Take 1 capsule (250 mg total) by mouth 2 (two) times daily. 30 capsule 0   No current facility-administered medications on file prior to visit.    Allergies  Allergen Reactions  . Bactrim [Sulfamethoxazole-Trimethoprim] Swelling    "tongue swelling"  . Ibuprofen     Kidney issues    No results found for this or any previous visit (from the past 2160 hour(s)).  Objective: There were no vitals filed for this visit.  General: Patient is awake, alert, oriented x 3 and in no acute distress.  Physical exam:To left lateral  foot the previous ulceration remains healed over.  Abrasions to anterior shin has resolved and now appears to be a patch of rash with inflammation + edema. Mild warmth. + venous stasis on left with mild venous skin irritation/rash. ++ Pain to posterior heel, there is decreased pain to lateral ankle and to lateral heel on left, Capillary fill intact to all toes. No pain with compression to calves bilateral. Pes cavus and varus foot and ankle deformity L>R with subjective pain with rotation of the ankle or when putting weight on his foot.   Assessment and Plan:  Problem List Items Addressed This Visit    None    Visit Diagnoses    Charcot ankle, left    -  Primary   Diabetic polyneuropathy associated with type 2 diabetes mellitus ([facility])       Left ankle pain, unspecified chronicity         -Examined patient  -Discussed patient continue pain secondary to now what has been revealed on CT scan as Charcot changes -Applied Ace wrap to left with instructions to use for edema control and to support foot also gave loaner cam boot to use to help keep pressure off his foot -Prescribed Medrol Dosepak for pain and inflammation advised patient to monitor his sugars could likely they will spike up while he is on this steroid -Refilled Dilaudid for severe pain -Recommend patient to rest, ice, elevate and return to wheelchair if available or rolling walker -Patient to return to office in 2 weeks or sooner if problems or issues arise.  Landis Martins, DPM

## 2017-09-27 ENCOUNTER — Ambulatory Visit (INDEPENDENT_AMBULATORY_CARE_PROVIDER_SITE_OTHER): Payer: Self-pay | Admitting: Sports Medicine

## 2017-09-27 ENCOUNTER — Encounter: Payer: Self-pay | Admitting: Sports Medicine

## 2017-09-27 DIAGNOSIS — M14672 Charcot's joint, left ankle and foot: Secondary | ICD-10-CM

## 2017-09-27 DIAGNOSIS — M25572 Pain in left ankle and joints of left foot: Secondary | ICD-10-CM

## 2017-09-27 DIAGNOSIS — E1142 Type 2 diabetes mellitus with diabetic polyneuropathy: Secondary | ICD-10-CM

## 2017-09-27 DIAGNOSIS — M25372 Other instability, left ankle: Secondary | ICD-10-CM

## 2017-09-27 DIAGNOSIS — Q667 Congenital pes cavus, unspecified foot: Secondary | ICD-10-CM

## 2017-09-27 DIAGNOSIS — M79672 Pain in left foot: Secondary | ICD-10-CM

## 2017-09-27 DIAGNOSIS — M659 Synovitis and tenosynovitis, unspecified: Secondary | ICD-10-CM

## 2017-09-27 MED ORDER — HYDROMORPHONE HCL 8 MG PO TABS: 8.0000 mg | ORAL_TABLET | ORAL | 0 refills | Status: DC | PRN

## 2017-09-27 NOTE — Progress Notes (Signed)
Subjective: Kristopher Simon is a 52 y.o. male patient seen in office for POV #14 s/p Left foot incision and drainage with removal of bone and placement of antibiotic beads  (Date of surgery 02-20-17). Patient states that he is still in a lot of pain won side of ankle and it really hurts to walk; the boot helps but still painful.  Patient reports that the Dilaudid is no longer touching the pain especially after he has ran out of it, denies vomiting/chills/shortness of breath. Patient has no other pedal complaints at this time.  Patient Active Problem List   Diagnosis Date Noted  . DKA (diabetic ketoacidoses) ([place]) 07/06/2014  . Cellulitis of left lower extremity 07/06/2014  . Tachycardia with 100 - 120 beats per minute 07/06/2014  . GERD (gastroesophageal reflux disease) 07/06/2014  . Essential hypertension 07/06/2014  . Hyponatremia 07/06/2014  . CAD (coronary artery disease), native coronary artery 07/06/2014  . Acute kidney injury ([place]) 02/05/2012  . Diabetes mellitus ([place]) 02/05/2012  . HTN (hypertension) 02/05/2012  . Nausea & vomiting 02/05/2012  . Shortness of breath 02/05/2012   Current Outpatient Medications on File Prior to Visit  Medication Sig Dispense Refill  . aspirin EC 81 MG tablet Take 81 mg by mouth daily.    Marland Kitchen CINNAMON PO Take 1 tablet by mouth every morning.    . clindamycin (CLEOCIN) 300 MG capsule Take 1 capsule (300 mg total) by mouth 3 (three) times daily. 30 capsule 0  . clobetasol cream (TEMOVATE) 6.46 % Apply 1 application topically daily. Daily at bedtime 60 g 0  . colchicine 0.6 MG tablet Take 1 tablet (0.6 mg total) daily by mouth. 7 tablet 0  . cyclobenzaprine (FLEXERIL) 10 MG tablet Take 10-20 mg by mouth 2 (two) times daily. 10mg  in the morning and 20mg  at bedtime    . esomeprazole (NEXIUM) 40 MG capsule Take 40 mg by mouth every morning.    . gabapentin (NEURONTIN) 300 MG capsule Take 300 mg by mouth 3 (three) times daily.    . hydrochlorothiazide  (HYDRODIURIL) 25 MG tablet Take 25 mg by mouth daily.    . hydrocortisone cream 0.5 % Apply 1 application 2 (two) times daily topically. To red areas on leg as needed 30 g 0  . insulin aspart (NOVOLOG FLEXPEN) 100 UNIT/ML FlexPen Inject 30-45 Units into the skin 3 (three) times daily with meals.    . insulin detemir (LEVEMIR) 100 UNIT/ML injection Inject 0.6 mLs (60 Units total) into the skin 2 (two) times daily. 10 mL 11  . methylPREDNISolone (MEDROL DOSEPAK) 4 MG TBPK tablet Take as instructed 21 tablet 0  . metoCLOPramide (REGLAN) 5 MG tablet Take 1 tablet (5 mg total) by mouth 4 (four) times daily -  before meals and at bedtime. (Patient not taking: Reported on 07/06/2014) 120 tablet 0  . metoprolol (LOPRESSOR) 100 MG tablet Take 100 mg by mouth 2 (two) times daily.    . metoprolol (LOPRESSOR) 50 MG tablet Take 50 mg by mouth 2 (two) times daily.    . mupirocin ointment (BACTROBAN) 2 % To left shin 22 g 0  . nystatin cream (MYCOSTATIN) Apply 1 application topically 3 (three) times daily.    . Probiotic Product (TRUBIOTICS PO) Take 1 tablet by mouth every morning.    . Pyridoxine HCl (B-6 PO) Take 1 tablet by mouth every morning.    . saccharomyces boulardii (FLORASTOR) 250 MG capsule Take 1 capsule (250 mg total) by mouth 2 (two) times daily. [place]  capsule 0   No current facility-administered medications on file prior to visit.    Allergies  Allergen Reactions  . Bactrim [Sulfamethoxazole-Trimethoprim] Swelling    "tongue swelling"  . Ibuprofen     Kidney issues    No results found for this or any previous visit (from the past 2160 hour(s)).  Objective: There were no vitals filed for this visit.  General: Patient is awake, alert, oriented x 3 and in no acute distress.  Physical exam:To left lateral foot the previous ulceration remains healed over.  Abrasions to anterior shin has resolved and now appears to be a patch of rash with inflammation + edema, decreased warmth. + venous stasis  on left with mild venous skin irritation/rash. ++ Pain to peroneal tendon, posterior heel, there is decreased pain to lateral heel on left, Capillary fill intact to all toes. No pain with compression to calves bilateral. Pes cavus and varus foot and ankle deformity L>R with subjective pain with rotation of the ankle or when putting weight on his foot.   Assessment and Plan:  Problem List Items Addressed This Visit    None    Visit Diagnoses    Charcot ankle, left    -  Primary   Diabetic polyneuropathy associated with type 2 diabetes mellitus (HCC)       Left ankle pain, unspecified chronicity       Left foot pain       Instability of left ankle joint       Pes cavus, congenital       Tenosynovitis of ankle         -Examined patient  -Discussed patient continue pain secondary to tenosynovitis and Charcot changes After oral consent and aseptic prep, injected a mixture containing 1 ml of 2%  plain lidocaine, 1 ml 0.5% plain marcaine, 0.5 ml of kenalog 10 and 0.5 ml of dexamethasone phosphate into left lateral ankle without complication. Post-injection care discussed with patient.  -Applied Ace wrap to left with instructions to use for edema control and to support foot also gave loaner cam boot to use to help keep pressure off his foot as previous -Refilled Dilaudid for severe pain -Recommend patient to rest, ice, elevate and return to wheelchair if available or rolling walker -Rx [facility] for CROW boot or custom shoes  -Patient to return to office in 2 weeks or sooner if problems or issues arise.  Landis Martins, DPM

## 2017-10-05 ENCOUNTER — Telehealth: Payer: Self-pay | Admitting: Sports Medicine

## 2017-10-05 ENCOUNTER — Other Ambulatory Visit: Payer: Self-pay | Admitting: Sports Medicine

## 2017-10-05 MED ORDER — HYDROMORPHONE HCL 8 MG PO TABS: 8.0000 mg | ORAL_TABLET | ORAL | 0 refills | Status: DC | PRN

## 2017-10-05 NOTE — Progress Notes (Signed)
Refilled pain medication -Dr. Yaniv Lage 

## 2017-10-05 NOTE — Telephone Encounter (Signed)
Dr Cannon Kettle approved and printed Rx.  Patient notified and will pick up.

## 2017-10-05 NOTE — Telephone Encounter (Signed)
Pt was calling to get a refill on meds.

## 2017-10-12 ENCOUNTER — Encounter: Payer: Self-pay | Admitting: Sports Medicine

## 2017-10-12 NOTE — Progress Notes (Signed)
Medical records from 21 October 2015 - Present requested by [employer] was put up front to be mailed out tomorrow, 13 October 2017.

## 2017-10-13 ENCOUNTER — Ambulatory Visit (INDEPENDENT_AMBULATORY_CARE_PROVIDER_SITE_OTHER): Payer: Self-pay | Admitting: Sports Medicine

## 2017-10-13 ENCOUNTER — Encounter: Payer: Self-pay | Admitting: Sports Medicine

## 2017-10-13 DIAGNOSIS — E1142 Type 2 diabetes mellitus with diabetic polyneuropathy: Secondary | ICD-10-CM

## 2017-10-13 DIAGNOSIS — M25572 Pain in left ankle and joints of left foot: Secondary | ICD-10-CM

## 2017-10-13 DIAGNOSIS — M79672 Pain in left foot: Secondary | ICD-10-CM

## 2017-10-13 DIAGNOSIS — Q667 Congenital pes cavus, unspecified foot: Secondary | ICD-10-CM

## 2017-10-13 DIAGNOSIS — M65979 Unspecified synovitis and tenosynovitis, unspecified ankle and foot: Secondary | ICD-10-CM

## 2017-10-13 DIAGNOSIS — M14672 Charcot's joint, left ankle and foot: Secondary | ICD-10-CM

## 2017-10-13 DIAGNOSIS — M25372 Other instability, left ankle: Secondary | ICD-10-CM

## 2017-10-13 DIAGNOSIS — M659 Synovitis and tenosynovitis, unspecified: Secondary | ICD-10-CM

## 2017-10-13 MED ORDER — HYDROMORPHONE HCL 8 MG PO TABS: 8.0000 mg | ORAL_TABLET | ORAL | 0 refills | Status: DC | PRN

## 2017-10-13 NOTE — Progress Notes (Signed)
Subjective: Kristopher Simon is a 52 y.o. male patient seen in office for POV #15 s/p Left foot incision and drainage with removal of bone and placement of antibiotic beads  (Date of surgery 02-20-17). Patient states that he is still in a lot of pain on NOW the ball of the foot.  Patient states that the pain along his ankle and along the side has decreased however he still gets consistent electrical jolt to the foot that are sometimes painful.  Patient states that his pain medication helps however sometimes the pain is so intense that nothing can help and is worsened by direct pressure to the area or sometimes how he turns his foot or how he is lying his foot down on the recliner or bed when he is resting.  Reports that the boot really helps him with walking.  Patient has no other pedal complaints at this time.  Patient Active Problem List   Diagnosis Date Noted  . DKA (diabetic ketoacidoses) ([place]) 07/06/2014  . Cellulitis of left lower extremity 07/06/2014  . Tachycardia with 100 - 120 beats per minute 07/06/2014  . GERD (gastroesophageal reflux disease) 07/06/2014  . Essential hypertension 07/06/2014  . Hyponatremia 07/06/2014  . CAD (coronary artery disease), native coronary artery 07/06/2014  . Acute kidney injury ([place]) 02/05/2012  . Diabetes mellitus ([place]) 02/05/2012  . HTN (hypertension) 02/05/2012  . Nausea & vomiting 02/05/2012  . Shortness of breath 02/05/2012   Current Outpatient Medications on File Prior to Visit  Medication Sig Dispense Refill  . aspirin EC 81 MG tablet Take 81 mg by mouth daily.    Marland Kitchen CINNAMON PO Take 1 tablet by mouth every morning.    . clindamycin (CLEOCIN) 300 MG capsule Take 1 capsule (300 mg total) by mouth 3 (three) times daily. 30 capsule 0  . clobetasol cream (TEMOVATE) 9.52 % Apply 1 application topically daily. Daily at bedtime 60 g 0  . colchicine 0.6 MG tablet Take 1 tablet (0.6 mg total) daily by mouth. 7 tablet 0  . cyclobenzaprine (FLEXERIL) 10 MG  tablet Take 10-20 mg by mouth 2 (two) times daily. 10mg  in the morning and 20mg  at bedtime    . esomeprazole (NEXIUM) 40 MG capsule Take 40 mg by mouth every morning.    . gabapentin (NEURONTIN) 300 MG capsule Take 300 mg by mouth 3 (three) times daily.    . hydrochlorothiazide (HYDRODIURIL) 25 MG tablet Take 25 mg by mouth daily.    . hydrocortisone cream 0.5 % Apply 1 application 2 (two) times daily topically. To red areas on leg as needed 30 g 0  . insulin aspart (NOVOLOG FLEXPEN) 100 UNIT/ML FlexPen Inject 30-45 Units into the skin 3 (three) times daily with meals.    . insulin detemir (LEVEMIR) 100 UNIT/ML injection Inject 0.6 mLs (60 Units total) into the skin 2 (two) times daily. 10 mL 11  . methylPREDNISolone (MEDROL DOSEPAK) 4 MG TBPK tablet Take as instructed 21 tablet 0  . metoCLOPramide (REGLAN) 5 MG tablet Take 1 tablet (5 mg total) by mouth 4 (four) times daily -  before meals and at bedtime. (Patient not taking: Reported on 07/06/2014) 120 tablet 0  . metoprolol (LOPRESSOR) 100 MG tablet Take 100 mg by mouth 2 (two) times daily.    . metoprolol (LOPRESSOR) 50 MG tablet Take 50 mg by mouth 2 (two) times daily.    . mupirocin ointment (BACTROBAN) 2 % To left shin 22 g 0  . nystatin cream (MYCOSTATIN) Apply 1  application topically 3 (three) times daily.    . Probiotic Product (TRUBIOTICS PO) Take 1 tablet by mouth every morning.    . Pyridoxine HCl (B-6 PO) Take 1 tablet by mouth every morning.    . saccharomyces boulardii (FLORASTOR) 250 MG capsule Take 1 capsule (250 mg total) by mouth 2 (two) times daily. 30 capsule 0   No current facility-administered medications on file prior to visit.    Allergies  Allergen Reactions  . Bactrim [Sulfamethoxazole-Trimethoprim] Swelling    "tongue swelling"  . Ibuprofen     Kidney issues    No results found for this or any previous visit (from the past 2160 hour(s)).  Objective: There were no vitals filed for this visit.  General:  Patient is awake, alert, oriented x 3 and in no acute distress.  Physical exam: There is pain with palpation today at the ball of the left foot, decreased pain to left lateral foot and ankle, +edema, decreased warmth. + venous stasis on left with mild venous skin irritation/rash, Capillary fill intact to all toes. No pain with compression to calves bilateral. Pes cavus and varus foot and ankle deformity L>R with subjective pain with rotation of the ankle or when putting weight on his foot now at the ball.   Assessment and Plan:  Problem List Items Addressed This Visit    None    Visit Diagnoses    Charcot ankle, left    -  Primary   Diabetic polyneuropathy associated with type 2 diabetes mellitus (HCC)       Left ankle pain, unspecified chronicity       Left foot pain       Instability of left ankle joint       Pes cavus, congenital       Tenosynovitis of ankle         -Examined patient  -Discussed patient continue pain secondary to tenosynovitis and Charcot changes and now possible overuse overload to the ball of the foot -Applied metatarsal padding to the ball of foot -Applied Ace wrap to left with instructions to use for edema control Continue with cam boot- -Refilled Dilaudid for severe pain -Recommend patient to increase gabapentin to 900 mg twice daily -Recommend patient to rest, ice, elevate and return to wheelchair if available or rolling walker -Awaiting evaluation by [facility] for CROW boot or custom shoes; encouraged patient to go ahead and get this done because it may be a 4-6-week process or more before he gets the custom boot or shoe -Patient to return to office in 2-3 weeks or sooner if problems or issues arise.  Landis Martins, DPM

## 2017-10-27 ENCOUNTER — Encounter: Payer: Self-pay | Admitting: Sports Medicine

## 2017-10-27 ENCOUNTER — Ambulatory Visit (INDEPENDENT_AMBULATORY_CARE_PROVIDER_SITE_OTHER): Payer: Self-pay | Admitting: Sports Medicine

## 2017-10-27 DIAGNOSIS — E1142 Type 2 diabetes mellitus with diabetic polyneuropathy: Secondary | ICD-10-CM

## 2017-10-27 DIAGNOSIS — M659 Synovitis and tenosynovitis, unspecified: Secondary | ICD-10-CM

## 2017-10-27 DIAGNOSIS — Q667 Congenital pes cavus, unspecified foot: Secondary | ICD-10-CM

## 2017-10-27 DIAGNOSIS — M25372 Other instability, left ankle: Secondary | ICD-10-CM

## 2017-10-27 DIAGNOSIS — M25572 Pain in left ankle and joints of left foot: Secondary | ICD-10-CM

## 2017-10-27 DIAGNOSIS — I739 Peripheral vascular disease, unspecified: Secondary | ICD-10-CM

## 2017-10-27 DIAGNOSIS — M79672 Pain in left foot: Secondary | ICD-10-CM

## 2017-10-27 DIAGNOSIS — M14672 Charcot's joint, left ankle and foot: Secondary | ICD-10-CM

## 2017-10-27 MED ORDER — HYDROMORPHONE HCL 8 MG PO TABS: 8.0000 mg | ORAL_TABLET | ORAL | 0 refills | Status: DC | PRN

## 2017-10-27 NOTE — Progress Notes (Signed)
Subjective: Kristopher Simon is a 52 y.o. male patient seen in office for POV #16 s/p Left foot incision and drainage with removal of bone and placement of antibiotic beads (Date of surgery 02-20-17). Patient states that pain is getting better however had an increased episode of pain on Tuesday when he went to be fitted for his [name] boot reports that he did a lot of walking to get to the office for his [employer] and also when they rewrap the Ace wrap likely did not do it correctly and calls a blister to pop up on his leg.  Patient states the pain that he has now is a burning sensation to the ball of his left foot and a little pain along the lateral ankle however it is much improved and feel like he is walking better.  Patient reports that he uses a walker outside of home and inside of home does not use walker and feels like he is walking better without as much pain.  Patient also reports that he increase his gabapentin and that has helped however still gets a pain that requires pain medication. Patient has no other pedal complaints at this time.  Patient Active Problem List   Diagnosis Date Noted  . DKA (diabetic ketoacidoses) ([place]) 07/06/2014  . Cellulitis of left lower extremity 07/06/2014  . Tachycardia with 100 - 120 beats per minute 07/06/2014  . GERD (gastroesophageal reflux disease) 07/06/2014  . Essential hypertension 07/06/2014  . Hyponatremia 07/06/2014  . CAD (coronary artery disease), native coronary artery 07/06/2014  . Acute kidney injury ([place]) 02/05/2012  . Diabetes mellitus ([place]) 02/05/2012  . HTN (hypertension) 02/05/2012  . Nausea & vomiting 02/05/2012  . Shortness of breath 02/05/2012   Current Outpatient Medications on File Prior to Visit  Medication Sig Dispense Refill  . aspirin EC 81 MG tablet Take 81 mg by mouth daily.    Marland Kitchen CINNAMON PO Take 1 tablet by mouth every morning.    . clindamycin (CLEOCIN) 300 MG capsule Take 1 capsule (300 mg total) by mouth 3 (three) times daily.  30 capsule 0  . clobetasol cream (TEMOVATE) 2.83 % Apply 1 application topically daily. Daily at bedtime 60 g 0  . colchicine 0.6 MG tablet Take 1 tablet (0.6 mg total) daily by mouth. 7 tablet 0  . cyclobenzaprine (FLEXERIL) 10 MG tablet Take 10-20 mg by mouth 2 (two) times daily. 10mg  in the morning and 20mg  at bedtime    . esomeprazole (NEXIUM) 40 MG capsule Take 40 mg by mouth every morning.    . gabapentin (NEURONTIN) 300 MG capsule Take 300 mg by mouth 3 (three) times daily.    . hydrochlorothiazide (HYDRODIURIL) 25 MG tablet Take 25 mg by mouth daily.    . hydrocortisone cream 0.5 % Apply 1 application 2 (two) times daily topically. To red areas on leg as needed 30 g 0  . HYDROmorphone (DILAUDID) 8 MG tablet Take 1 tablet (8 mg total) by mouth every 4 (four) hours as needed for severe pain. 21 tablet 0  . insulin aspart (NOVOLOG FLEXPEN) 100 UNIT/ML FlexPen Inject 30-45 Units into the skin 3 (three) times daily with meals.    . insulin detemir (LEVEMIR) 100 UNIT/ML injection Inject 0.6 mLs (60 Units total) into the skin 2 (two) times daily. 10 mL 11  . methylPREDNISolone (MEDROL DOSEPAK) 4 MG TBPK tablet Take as instructed 21 tablet 0  . metoCLOPramide (REGLAN) 5 MG tablet Take 1 tablet (5 mg total) by mouth 4 (  four) times daily -  before meals and at bedtime. (Patient not taking: Reported on 07/06/2014) 120 tablet 0  . metoprolol (LOPRESSOR) 100 MG tablet Take 100 mg by mouth 2 (two) times daily.    . metoprolol (LOPRESSOR) 50 MG tablet Take 50 mg by mouth 2 (two) times daily.    . mupirocin ointment (BACTROBAN) 2 % To left shin 22 g 0  . nystatin cream (MYCOSTATIN) Apply 1 application topically 3 (three) times daily.    . Probiotic Product (TRUBIOTICS PO) Take 1 tablet by mouth every morning.    . Pyridoxine HCl (B-6 PO) Take 1 tablet by mouth every morning.    . saccharomyces boulardii (FLORASTOR) 250 MG capsule Take 1 capsule (250 mg total) by mouth 2 (two) times daily. 30 capsule 0    No current facility-administered medications on file prior to visit.    Allergies  Allergen Reactions  . Bactrim [Sulfamethoxazole-Trimethoprim] Swelling    "tongue swelling"  . Ibuprofen     Kidney issues    No results found for this or any previous visit (from the past 2160 hour(s)).  Objective: There were no vitals filed for this visit.  General: Patient is awake, alert, oriented x 3 and in no acute distress.  Physical exam: There is mild tenderness palpation to lateral heel and ankle, there is no tenderness with palpation to the ball subjective burning sensation present, + trace edema, decreased warmth. + venous stasis on left with mild venous skin irritation/rash and a clear fluid blister located at the anterior medial lower leg with no signs of infection, Capillary fill intact to all toes. No pain with compression to calves bilateral. Pes cavus and varus foot and ankle deformity L>R with subjective pain with rotation of the ankle and direct pressure that is slowly improving.  Assessment and Plan:  Problem List Items Addressed This Visit    None    Visit Diagnoses    Charcot ankle, left    -  Primary   Diabetic polyneuropathy associated with type 2 diabetes mellitus (HCC)       Left ankle pain, unspecified chronicity       Left foot pain       Instability of left ankle joint       Pes cavus, congenital       Tenosynovitis of ankle       PVD (peripheral vascular disease) (HCC)       water blisters on left leg     -Examined patient  -Re-Discussed with patient continue pain secondary to tenosynovitis and Charcot changes and PVD with noninfected blister -Applied adaptic to left leg blister -Applied Ace wrap to left with instructions to continue to use for edema control -Continue with cam boot until he has gotten his CROW boot -Refilled Dilaudid for severe pain -Recommend continue with gabapentin to 900 mg twice daily and do not mix with pain medication  -Recommend  patient to rest, ice, elevation  -Continue with rolling walker  -Patient to return to office in 2-3 weeks or sooner if problems or issues arise.  Landis Martins, DPM

## 2017-10-31 ENCOUNTER — Other Ambulatory Visit: Payer: Self-pay | Admitting: Sports Medicine

## 2017-10-31 DIAGNOSIS — M79672 Pain in left foot: Secondary | ICD-10-CM

## 2017-10-31 DIAGNOSIS — S82892A Other fracture of left lower leg, initial encounter for closed fracture: Secondary | ICD-10-CM

## 2017-11-01 ENCOUNTER — Telehealth: Payer: Self-pay | Admitting: Sports Medicine

## 2017-11-01 DIAGNOSIS — M25372 Other instability, left ankle: Secondary | ICD-10-CM

## 2017-11-01 DIAGNOSIS — M79672 Pain in left foot: Secondary | ICD-10-CM

## 2017-11-01 DIAGNOSIS — M14672 Charcot's joint, left ankle and foot: Secondary | ICD-10-CM

## 2017-11-01 DIAGNOSIS — M25572 Pain in left ankle and joints of left foot: Secondary | ICD-10-CM

## 2017-11-01 DIAGNOSIS — E1142 Type 2 diabetes mellitus with diabetic polyneuropathy: Secondary | ICD-10-CM

## 2017-11-01 NOTE — Telephone Encounter (Signed)
Pt request refill on pain meds

## 2017-11-01 NOTE — Telephone Encounter (Signed)
Dr. Stover please advise 

## 2017-11-01 NOTE — Telephone Encounter (Signed)
He can not have a refill until on or after 11-03-17 since I just refilled them last week Dr. Cannon Kettle

## 2017-11-03 MED ORDER — HYDROMORPHONE HCL 8 MG PO TABS
8.0000 mg | ORAL_TABLET | ORAL | 0 refills | Status: DC | PRN
Start: 2017-11-03 — End: 2017-11-16

## 2017-11-03 NOTE — Telephone Encounter (Signed)
Patient called back and was notified he will stop by the office in the late afternoon to pick up

## 2017-11-03 NOTE — Addendum Note (Signed)
Addended by: Johnnye Lana A on: 11/03/2017 04:10 PM   Modules accepted: Orders

## 2017-11-03 NOTE — Telephone Encounter (Signed)
Patient arrived in office to pick up refill of pain meds per Dr.Stover

## 2017-11-16 ENCOUNTER — Encounter: Payer: Self-pay | Admitting: Sports Medicine

## 2017-11-16 ENCOUNTER — Ambulatory Visit (INDEPENDENT_AMBULATORY_CARE_PROVIDER_SITE_OTHER): Payer: Self-pay | Admitting: Sports Medicine

## 2017-11-16 DIAGNOSIS — M659 Synovitis and tenosynovitis, unspecified: Secondary | ICD-10-CM

## 2017-11-16 DIAGNOSIS — E1142 Type 2 diabetes mellitus with diabetic polyneuropathy: Secondary | ICD-10-CM

## 2017-11-16 DIAGNOSIS — Q667 Congenital pes cavus, unspecified foot: Secondary | ICD-10-CM

## 2017-11-16 DIAGNOSIS — M14672 Charcot's joint, left ankle and foot: Secondary | ICD-10-CM

## 2017-11-16 DIAGNOSIS — I739 Peripheral vascular disease, unspecified: Secondary | ICD-10-CM

## 2017-11-16 DIAGNOSIS — M79672 Pain in left foot: Secondary | ICD-10-CM

## 2017-11-16 DIAGNOSIS — M25372 Other instability, left ankle: Secondary | ICD-10-CM

## 2017-11-16 DIAGNOSIS — M25572 Pain in left ankle and joints of left foot: Secondary | ICD-10-CM

## 2017-11-16 MED ORDER — HYDROMORPHONE HCL 8 MG PO TABS: 8.0000 mg | ORAL_TABLET | ORAL | 0 refills | Status: DC | PRN

## 2017-11-16 MED ORDER — TRIAMCINOLONE ACETONIDE 10 MG/ML IJ SUSP: 10.0000 mg | Freq: Once | INTRAMUSCULAR | Status: DC

## 2017-11-16 NOTE — Progress Notes (Signed)
Subjective: Kristopher Simon is a 52 y.o. male patient seen in office for POV #17 s/p Left foot incision and drainage with removal of bone and placement of antibiotic beads (Date of surgery 02-20-17). Patient states that pain is flaring back up again and feels like his pain medicine is no longer working.  Patient states that he has pain at the side and the back of his foot and ankle.  Patient admits to seeing some clear fluid draining from his foot and on the level of the Ace wrap however he did not remove her take off the Ace wrap that was placed every since 3 weeks ago.  Patient has no other pedal complaints at this time.  Patient Active Problem List   Diagnosis Date Noted  . DKA (diabetic ketoacidoses) ([place]) 07/06/2014  . Cellulitis of left lower extremity 07/06/2014  . Tachycardia with 100 - 120 beats per minute 07/06/2014  . GERD (gastroesophageal reflux disease) 07/06/2014  . Essential hypertension 07/06/2014  . Hyponatremia 07/06/2014  . CAD (coronary artery disease), native coronary artery 07/06/2014  . Acute kidney injury ([place]) 02/05/2012  . Diabetes mellitus ([place]) 02/05/2012  . HTN (hypertension) 02/05/2012  . Nausea & vomiting 02/05/2012  . Shortness of breath 02/05/2012   Current Outpatient Medications on File Prior to Visit  Medication Sig Dispense Refill  . aspirin EC 81 MG tablet Take 81 mg by mouth daily.    Marland Kitchen CINNAMON PO Take 1 tablet by mouth every morning.    . clindamycin (CLEOCIN) 300 MG capsule Take 1 capsule (300 mg total) by mouth 3 (three) times daily. 30 capsule 0  . clobetasol cream (TEMOVATE) 4.81 % Apply 1 application topically daily. Daily at bedtime 60 g 0  . colchicine 0.6 MG tablet Take 1 tablet (0.6 mg total) daily by mouth. 7 tablet 0  . cyclobenzaprine (FLEXERIL) 10 MG tablet Take 10-20 mg by mouth 2 (two) times daily. 10mg  in the morning and 20mg  at bedtime    . esomeprazole (NEXIUM) 40 MG capsule Take 40 mg by mouth every morning.    . gabapentin  (NEURONTIN) 300 MG capsule Take 300 mg by mouth 3 (three) times daily.    . hydrochlorothiazide (HYDRODIURIL) 25 MG tablet Take 25 mg by mouth daily.    . hydrocortisone cream 0.5 % Apply 1 application 2 (two) times daily topically. To red areas on leg as needed 30 g 0  . HYDROmorphone (DILAUDID) 8 MG tablet Take 1 tablet (8 mg total) by mouth every 4 (four) hours as needed for severe pain. 21 tablet 0  . insulin aspart (NOVOLOG FLEXPEN) 100 UNIT/ML FlexPen Inject 30-45 Units into the skin 3 (three) times daily with meals.    . insulin detemir (LEVEMIR) 100 UNIT/ML injection Inject 0.6 mLs (60 Units total) into the skin 2 (two) times daily. 10 mL 11  . methylPREDNISolone (MEDROL DOSEPAK) 4 MG TBPK tablet Take as instructed 21 tablet 0  . metoCLOPramide (REGLAN) 5 MG tablet Take 1 tablet (5 mg total) by mouth 4 (four) times daily -  before meals and at bedtime. (Patient not taking: Reported on 07/06/2014) 120 tablet 0  . metoprolol (LOPRESSOR) 100 MG tablet Take 100 mg by mouth 2 (two) times daily.    . metoprolol (LOPRESSOR) 50 MG tablet Take 50 mg by mouth 2 (two) times daily.    . mupirocin ointment (BACTROBAN) 2 % To left shin 22 g 0  . nystatin cream (MYCOSTATIN) Apply 1 application topically 3 (three) times daily.    Marland Kitchen  Probiotic Product (TRUBIOTICS PO) Take 1 tablet by mouth every morning.    . Pyridoxine HCl (B-6 PO) Take 1 tablet by mouth every morning.    . saccharomyces boulardii (FLORASTOR) 250 MG capsule Take 1 capsule (250 mg total) by mouth 2 (two) times daily. 30 capsule 0   No current facility-administered medications on file prior to visit.    Allergies  Allergen Reactions  . Bactrim [Sulfamethoxazole-Trimethoprim] Swelling    "tongue swelling"  . Ibuprofen     Kidney issues    No results found for this or any previous visit (from the past 2160 hour(s)).  Objective: There were no vitals filed for this visit.  General: Patient is awake, alert, oriented x 3 and in no  acute distress.  Physical exam: There is moderate tenderness palpation to lateral heel and ankle, there is no tenderness with palpation to the ball subjective burning sensation present, + trace edema, decreased warmth. + venous stasis on left with mild venous skin irritation/rash and a clear fluid blister located at the anterior medial lower leg and a area of maceration at the plantar lateral foot with no signs of infection, Capillary fill intact to all toes. No pain with compression to calves bilateral. Pes cavus and varus foot and ankle deformity L>R with subjective pain with rotation of the ankle and direct pressure that is worse today per patient.  Assessment and Plan:  Problem List Items Addressed This Visit    None    Visit Diagnoses    Charcot ankle, left    -  Primary   Diabetic polyneuropathy associated with type 2 diabetes mellitus (HCC)       Instability of left ankle joint       Pes cavus, congenital       Tenosynovitis of ankle       PVD (peripheral vascular disease) ([place])         -Examined patient  -Re-Discussed with patient continue pain secondary to tenosynovitis and Charcot changes and PVD After oral consent and aseptic prep, injected a mixture containing 1 ml of 2%  plain lidocaine, 1 ml 0.5% plain marcaine, 0.5 ml of kenalog 10 and 0.5 ml of dexamethasone phosphate into -left lateral ankle without complication. Post-injection care discussed with patient.  -Applied Ace wrap to left with instructions to continue to use for edema control -Continue with cam boot until he has gotten his CROW boot of which he is still awaiting -Refilled Dilaudid for severe pain -Referred to pain mgt for management for further recommendations and control for his pain -Recommend continue with gabapentin to 900 mg twice daily and do not mix with pain medication  -Recommend patient to rest, ice, elevation  -Continue with rolling walker  -Patient to return to office after pain mgt  or sooner if  problems or issues arise.  Landis Martins, DPM

## 2017-11-17 ENCOUNTER — Telehealth: Payer: Self-pay | Admitting: *Deleted

## 2017-11-17 DIAGNOSIS — M25572 Pain in left ankle and joints of left foot: Secondary | ICD-10-CM

## 2017-11-17 DIAGNOSIS — Q667 Congenital pes cavus, unspecified foot: Secondary | ICD-10-CM

## 2017-11-17 DIAGNOSIS — E1142 Type 2 diabetes mellitus with diabetic polyneuropathy: Secondary | ICD-10-CM

## 2017-11-17 DIAGNOSIS — M14672 Charcot's joint, left ankle and foot: Secondary | ICD-10-CM

## 2017-11-17 DIAGNOSIS — I739 Peripheral vascular disease, unspecified: Secondary | ICD-10-CM

## 2017-11-17 DIAGNOSIS — M79672 Pain in left foot: Secondary | ICD-10-CM

## 2017-11-17 DIAGNOSIS — M25372 Other instability, left ankle: Secondary | ICD-10-CM

## 2017-11-17 DIAGNOSIS — M659 Synovitis and tenosynovitis, unspecified: Secondary | ICD-10-CM

## 2017-11-17 NOTE — Telephone Encounter (Signed)
-----   Message from Landis Martins, [place] sent at 11/16/2017  1:34 PM EDT ----- Regarding: Refer to Pain Management Chronic foot and ankle pain with Charcot deformity on left

## 2017-11-24 ENCOUNTER — Telehealth: Payer: Self-pay | Admitting: *Deleted

## 2017-11-24 NOTE — Telephone Encounter (Signed)
Patient called saying that he has been having a lot of pain still but when he gets up to move around it eases off.  He says he has been having very strange dreams and sweating profusely.  He talked to his mom who suggested he stop taking the pain med he did a few days ago and the dreams and sweating have eased.  He is still having pain but has not taken off boot or wrap.  Told him to take off both for at least an hour have his sister who is with him today reapply the ace wrap making sure it is not to tight and use Ibuprofen for the pain if needed.  Told him to see how he feels around 2pm and call me back if he needs an appointment.

## 2017-11-24 NOTE — Telephone Encounter (Signed)
Ok thanks 

## 2017-12-07 ENCOUNTER — Ambulatory Visit (INDEPENDENT_AMBULATORY_CARE_PROVIDER_SITE_OTHER): Payer: Medicaid Other | Admitting: Sports Medicine

## 2017-12-07 ENCOUNTER — Encounter: Payer: Self-pay | Admitting: Sports Medicine

## 2017-12-07 DIAGNOSIS — E1142 Type 2 diabetes mellitus with diabetic polyneuropathy: Secondary | ICD-10-CM

## 2017-12-07 DIAGNOSIS — Q667 Congenital pes cavus, unspecified foot: Secondary | ICD-10-CM

## 2017-12-07 DIAGNOSIS — M25372 Other instability, left ankle: Secondary | ICD-10-CM

## 2017-12-07 DIAGNOSIS — M14672 Charcot's joint, left ankle and foot: Secondary | ICD-10-CM | POA: Diagnosis not present

## 2017-12-07 DIAGNOSIS — M65979 Unspecified synovitis and tenosynovitis, unspecified ankle and foot: Secondary | ICD-10-CM

## 2017-12-07 DIAGNOSIS — M659 Synovitis and tenosynovitis, unspecified: Secondary | ICD-10-CM

## 2017-12-07 NOTE — Addendum Note (Signed)
Addended by: Landis Martins T on: 12/07/2017 05:36 PM   Modules accepted: Level of Service

## 2017-12-07 NOTE — Progress Notes (Signed)
Subjective: Kristopher Simon is a 52 y.o. male patient seen in office for POV #18 s/p Left foot incision and drainage with removal of bone and placement of antibiotic beads (Date of surgery 02-20-17). Patient states that pain is extreme on the side of his left foot and ankle.  Patient states that when he walks he feels like his foot is turning inward and that since he stopped his pain medication due to sweating and excessive dreaming the pain has intensified and wants to discuss if anything more can be done. Patient has no other pedal complaints at this time.  Patient Active Problem List   Diagnosis Date Noted  . DKA (diabetic ketoacidoses) ([place]) 07/06/2014  . Cellulitis of left lower extremity 07/06/2014  . Tachycardia with 100 - 120 beats per minute 07/06/2014  . GERD (gastroesophageal reflux disease) 07/06/2014  . Essential hypertension 07/06/2014  . Hyponatremia 07/06/2014  . CAD (coronary artery disease), native coronary artery 07/06/2014  . Acute kidney injury ([place]) 02/05/2012  . Diabetes mellitus ([place]) 02/05/2012  . HTN (hypertension) 02/05/2012  . Nausea & vomiting 02/05/2012  . Shortness of breath 02/05/2012   Current Outpatient Medications on File Prior to Visit  Medication Sig Dispense Refill  . aspirin EC 81 MG tablet Take 81 mg by mouth daily.    Marland Kitchen CINNAMON PO Take 1 tablet by mouth every morning.    . clindamycin (CLEOCIN) 300 MG capsule Take 1 capsule (300 mg total) by mouth 3 (three) times daily. 30 capsule 0  . clobetasol cream (TEMOVATE) 0.97 % Apply 1 application topically daily. Daily at bedtime 60 g 0  . colchicine 0.6 MG tablet Take 1 tablet (0.6 mg total) daily by mouth. 7 tablet 0  . cyclobenzaprine (FLEXERIL) 10 MG tablet Take 10-20 mg by mouth 2 (two) times daily. 10mg  in the morning and 20mg  at bedtime    . esomeprazole (NEXIUM) 40 MG capsule Take 40 mg by mouth every morning.    . gabapentin (NEURONTIN) 300 MG capsule Take 300 mg by mouth 3 (three) times daily.     . hydrochlorothiazide (HYDRODIURIL) 25 MG tablet Take 25 mg by mouth daily.    . hydrocortisone cream 0.5 % Apply 1 application 2 (two) times daily topically. To red areas on leg as needed 30 g 0  . HYDROmorphone (DILAUDID) 8 MG tablet Take 1 tablet (8 mg total) by mouth every 4 (four) hours as needed for severe pain. 21 tablet 0  . insulin aspart (NOVOLOG FLEXPEN) 100 UNIT/ML FlexPen Inject 30-45 Units into the skin 3 (three) times daily with meals.    . insulin detemir (LEVEMIR) 100 UNIT/ML injection Inject 0.6 mLs (60 Units total) into the skin 2 (two) times daily. 10 mL 11  . methylPREDNISolone (MEDROL DOSEPAK) 4 MG TBPK tablet Take as instructed 21 tablet 0  . metoprolol (LOPRESSOR) 100 MG tablet Take 100 mg by mouth 2 (two) times daily.    . metoprolol (LOPRESSOR) 50 MG tablet Take 50 mg by mouth 2 (two) times daily.    . mupirocin ointment (BACTROBAN) 2 % To left shin 22 g 0  . nystatin cream (MYCOSTATIN) Apply 1 application topically 3 (three) times daily.    . Probiotic Product (TRUBIOTICS PO) Take 1 tablet by mouth every morning.    . Pyridoxine HCl (B-6 PO) Take 1 tablet by mouth every morning.    . saccharomyces boulardii (FLORASTOR) 250 MG capsule Take 1 capsule (250 mg total) by mouth 2 (two) times daily. 30 capsule 0  .  metoCLOPramide (REGLAN) 5 MG tablet Take 1 tablet (5 mg total) by mouth 4 (four) times daily -  before meals and at bedtime. (Patient not taking: Reported on 07/06/2014) 120 tablet 0   Current Facility-Administered Medications on File Prior to Visit  Medication Dose Route Frequency Provider Last Rate Last Dose  . triamcinolone acetonide (KENALOG) 10 MG/ML injection 10 mg  10 mg Other Once Landis Martins, DPM       Allergies  Allergen Reactions  . Bactrim [Sulfamethoxazole-Trimethoprim] Swelling    "tongue swelling"  . Ibuprofen     Kidney issues    No results found for this or any previous visit (from the past 2160 hour(s)).  Objective: There were no  vitals filed for this visit.  General: Patient is awake, alert, oriented x 3 and in no acute distress.  Physical exam: There is moderate tenderness palpation to lateral heel and ankle, + venous stasis on left with mild venous skin irritation/rash anterior lower leg, plantar foot wound is healed with no signs of infection, Capillary fill intact to all toes. No pain with compression to calves bilateral. 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  Assessment and Plan:  Problem List Items Addressed This Visit    None    Visit Diagnoses    Charcot's joint of ankle, left    -  Primary   Instability of ankle joint, left       Congenital pes cavus       Tenosynovitis of ankle       Diabetic polyneuropathy associated with type 2 diabetes mellitus ([place])         -Examined patient  -Re-Discussed with patient continued pain secondary to tenosynovitis with instability and Charcot changes and PVD -Applied padding to lateral foot and recommend continue with CAM boot -Applied Ace wrap to left with instructions to continue to use for edema control -Continue with cam boot until he has gotten his CROW boot of which he is still awaiting; has not yet gotten it because he just recently got [employer] and is unsure of the cost -Patient refused to continue to try to seek pain management help for his pain due to cost -Recommend patient to rest, ice, elevate the foot and lower leg -Continue with rolling walker for stability in gait -Advised patient that the only option at this point is trying to correct his foot and lower leg deformity by the use of a fusion of which I do not perform using an intramedullary nail of which we can send a referral for another opinion from Ortho to see if they think this would be advantageous for the patient however I advised patient that he has a lot of risk factors because he is overweight  his diabetes is poorly managed and this puts him at risk of poor healing any type of extensive procedure as such as this.  Patient expressed understanding and would like to consider Ortho evaluation; office will send referral -Patient to return to office after as needed or sooner if problems or issues arise.  Landis Martins, DPM

## 2017-12-11 ENCOUNTER — Telehealth: Payer: Self-pay | Admitting: *Deleted

## 2017-12-11 DIAGNOSIS — M25372 Other instability, left ankle: Secondary | ICD-10-CM

## 2017-12-11 DIAGNOSIS — Q667 Congenital pes cavus, unspecified foot: Secondary | ICD-10-CM

## 2017-12-11 DIAGNOSIS — M14672 Charcot's joint, left ankle and foot: Secondary | ICD-10-CM

## 2017-12-11 DIAGNOSIS — E1142 Type 2 diabetes mellitus with diabetic polyneuropathy: Secondary | ICD-10-CM

## 2017-12-11 DIAGNOSIS — M659 Synovitis and tenosynovitis, unspecified: Secondary | ICD-10-CM

## 2017-12-11 NOTE — Telephone Encounter (Signed)
-----   Message from Landis Martins, [place] sent at 12/07/2017  2:38 PM EDT ----- Regarding: Refer to Ortho Consult ortho for opinion if patient wound benefit from IM nail fusion procedure on left

## 2017-12-11 NOTE — Telephone Encounter (Signed)
Faxed referral, clinicals and demographics to [facility] [employer].

## 2017-12-13 ENCOUNTER — Telehealth: Payer: Self-pay | Admitting: *Deleted

## 2017-12-13 DIAGNOSIS — M79676 Pain in unspecified toe(s): Secondary | ICD-10-CM

## 2017-12-13 DIAGNOSIS — M25372 Other instability, left ankle: Secondary | ICD-10-CM

## 2017-12-13 DIAGNOSIS — M65979 Unspecified synovitis and tenosynovitis, unspecified ankle and foot: Secondary | ICD-10-CM

## 2017-12-13 DIAGNOSIS — M659 Synovitis and tenosynovitis, unspecified: Secondary | ICD-10-CM

## 2017-12-13 DIAGNOSIS — E1142 Type 2 diabetes mellitus with diabetic polyneuropathy: Secondary | ICD-10-CM

## 2017-12-13 DIAGNOSIS — M14672 Charcot's joint, left ankle and foot: Secondary | ICD-10-CM

## 2017-12-13 DIAGNOSIS — Q667 Congenital pes cavus, unspecified foot: Secondary | ICD-10-CM

## 2017-12-13 NOTE — Telephone Encounter (Signed)
Faxed referral, clinicals and demographics to [facility]- Dr. Joie Bimler.

## 2017-12-13 NOTE — Telephone Encounter (Signed)
-----   Message from Landis Martins, [place] sent at 12/07/2017  2:38 PM EDT ----- Regarding: Refer to Ortho Consult ortho for opinion if patient wound benefit from IM nail fusion procedure on left

## 2017-12-13 NOTE — Telephone Encounter (Signed)
States the referral should have went to Dr. Leonard Downing in [place].

## 2018-01-19 ENCOUNTER — Ambulatory Visit (INDEPENDENT_AMBULATORY_CARE_PROVIDER_SITE_OTHER): Payer: Medicaid Other | Admitting: Sports Medicine

## 2018-01-19 ENCOUNTER — Encounter: Payer: Self-pay | Admitting: Sports Medicine

## 2018-01-19 DIAGNOSIS — Q667 Congenital pes cavus, unspecified foot: Secondary | ICD-10-CM

## 2018-01-19 DIAGNOSIS — M14672 Charcot's joint, left ankle and foot: Secondary | ICD-10-CM | POA: Diagnosis not present

## 2018-01-19 DIAGNOSIS — E1142 Type 2 diabetes mellitus with diabetic polyneuropathy: Secondary | ICD-10-CM

## 2018-01-19 DIAGNOSIS — M659 Synovitis and tenosynovitis, unspecified: Secondary | ICD-10-CM | POA: Diagnosis not present

## 2018-01-19 DIAGNOSIS — M65979 Unspecified synovitis and tenosynovitis, unspecified ankle and foot: Secondary | ICD-10-CM

## 2018-01-19 DIAGNOSIS — M25372 Other instability, left ankle: Secondary | ICD-10-CM

## 2018-01-19 DIAGNOSIS — I739 Peripheral vascular disease, unspecified: Secondary | ICD-10-CM | POA: Diagnosis not present

## 2018-01-19 DIAGNOSIS — L608 Other nail disorders: Secondary | ICD-10-CM

## 2018-01-19 DIAGNOSIS — R2681 Unsteadiness on feet: Secondary | ICD-10-CM

## 2018-01-19 NOTE — Progress Notes (Signed)
Subjective: Kristopher Simon is a 52 y.o. male patient seen in office for POV #19 s/p Left foot incision and drainage with removal of bone and placement of antibiotic beads (Date of surgery 02-20-17). Patient states that pain is better with the use of CBD oral and Percocet 15 which he is getting from a friend.  Reports that he went for his Kristopher Simon boot and states that it feels fine on the foot however on the leg there is some swelling in the boot has to be readjusted or remeasured.  Patient reports that he wants to consider a electric wheelchair and is strongly considering since he was not called for the orthopedic consult and he is considering to have a general surgery consult for possible below the knee amputation if his pain continues.  Patient also admits that he hit his right great toenail on walker and wants me to check it denies any pain swelling drainage or any acute symptoms from this nail.  Patient has no other pedal complaints at this time.  Patient Active Problem List   Diagnosis Date Noted  . DKA (diabetic ketoacidoses) ([place]) 07/06/2014  . Cellulitis of left lower extremity 07/06/2014  . Tachycardia with 100 - 120 beats per minute 07/06/2014  . GERD (gastroesophageal reflux disease) 07/06/2014  . Essential hypertension 07/06/2014  . Hyponatremia 07/06/2014  . CAD (coronary artery disease), native coronary artery 07/06/2014  . Acute kidney injury ([place]) 02/05/2012  . Diabetes mellitus ([place]) 02/05/2012  . HTN (hypertension) 02/05/2012  . Nausea & vomiting 02/05/2012  . Shortness of breath 02/05/2012   Current Outpatient Medications on File Prior to Visit  Medication Sig Dispense Refill  . aspirin EC 81 MG tablet Take 81 mg by mouth daily.    Marland Kitchen CINNAMON PO Take 1 tablet by mouth every morning.    . clindamycin (CLEOCIN) 300 MG capsule Take 1 capsule (300 mg total) by mouth 3 (three) times daily. 30 capsule 0  . clobetasol cream (TEMOVATE) 7.82 % Apply 1 application topically daily. Daily at  bedtime 60 g 0  . colchicine 0.6 MG tablet Take 1 tablet (0.6 mg total) daily by mouth. 7 tablet 0  . cyclobenzaprine (FLEXERIL) 10 MG tablet Take 10-20 mg by mouth 2 (two) times daily. 10mg  in the morning and 20mg  at bedtime    . esomeprazole (NEXIUM) 40 MG capsule Take 40 mg by mouth every morning.    . gabapentin (NEURONTIN) 300 MG capsule Take 300 mg by mouth 3 (three) times daily.    . hydrochlorothiazide (HYDRODIURIL) 25 MG tablet Take 25 mg by mouth daily.    . hydrocortisone cream 0.5 % Apply 1 application 2 (two) times daily topically. To red areas on leg as needed 30 g 0  . HYDROmorphone (DILAUDID) 8 MG tablet Take 1 tablet (8 mg total) by mouth every 4 (four) hours as needed for severe pain. 21 tablet 0  . insulin aspart (NOVOLOG FLEXPEN) 100 UNIT/ML FlexPen Inject 30-45 Units into the skin 3 (three) times daily with meals.    . insulin detemir (LEVEMIR) 100 UNIT/ML injection Inject 0.6 mLs (60 Units total) into the skin 2 (two) times daily. 10 mL 11  . methylPREDNISolone (MEDROL DOSEPAK) 4 MG TBPK tablet Take as instructed 21 tablet 0  . metoprolol (LOPRESSOR) 100 MG tablet Take 100 mg by mouth 2 (two) times daily.    . metoprolol (LOPRESSOR) 50 MG tablet Take 50 mg by mouth 2 (two) times daily.    . mupirocin ointment (BACTROBAN) 2 %  To left shin 22 g 0  . nystatin cream (MYCOSTATIN) Apply 1 application topically 3 (three) times daily.    . Probiotic Product (TRUBIOTICS PO) Take 1 tablet by mouth every morning.    . Pyridoxine HCl (B-6 PO) Take 1 tablet by mouth every morning.    . saccharomyces boulardii (FLORASTOR) 250 MG capsule Take 1 capsule (250 mg total) by mouth 2 (two) times daily. 30 capsule 0  . metoCLOPramide (REGLAN) 5 MG tablet Take 1 tablet (5 mg total) by mouth 4 (four) times daily -  before meals and at bedtime. (Patient not taking: Reported on 07/06/2014) 120 tablet 0   Current Facility-Administered Medications on File Prior to Visit  Medication Dose Route  Frequency Provider Last Rate Last Dose  . triamcinolone acetonide (KENALOG) 10 MG/ML injection 10 mg  10 mg Other Once Landis Martins, DPM       Allergies  Allergen Reactions  . Bactrim [Sulfamethoxazole-Trimethoprim] Swelling    "tongue swelling"  . Ibuprofen     Kidney issues    No results found for this or any previous visit (from the past 2160 hour(s)).  Objective: There were no vitals filed for this visit.  General: Patient is awake, alert, oriented x 3 and in no acute distress.  Physical exam: Left foot.,  Right great toe, there is moderate tenderness palpation to lateral heel and ankle that is improved from prior, + venous stasis on left with mild venous skin irritation/rash anterior lower leg, plantar foot wound is healed with no signs of infection, Capillary fill intact to all toes. No pain with compression to calves bilateral. 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  Right foot, blood at first toenail with no surrounding signs of infection.  Assessment and Plan:  Problem List Items Addressed This Visit    None    Visit Diagnoses    Charcot's joint of ankle, left    -  Primary   Unstable left ankle       Relevant Orders   DME Wheelchair electric   Congenital pes cavus       Tenosynovitis of ankle       Type 2 diabetes mellitus with polyneuropathy ([place])       Relevant Orders   DME Wheelchair electric   Diabetic polyneuropathy associated with type 2 diabetes mellitus ([place])       Relevant Orders   DME Wheelchair electric   Nail hemorrhage       R   PVD (peripheral vascular disease) ([place])       Relevant Orders   DME Wheelchair electric   Gait instability       Relevant Orders   DME Wheelchair electric     -Examined patient  -Re-Discussed with patient continued pain secondary to tenosynovitis with instability and Charcot changes and PVD and also new findings of dried blood  underneath the right great toenail -Applied surgitube compression sleeve to the left -Continue with cam boot until he has gotten his CROW boot of which he is still awaiting; replacement cam boot given -Discouraged patient from using and getting pain medicine at street value from other persons -May continue with CBD for pain in the left lower extremity -Recommend patient to rest, ice, elevate the foot and lower leg -Continue with rolling walker for stability in gait -Prescribed electric wheelchair -Advised patient to closely monitor right great toenail to allow blood  to slowly grow out -Advised patient that whenever he is ready I can put in a consult for general surgery for a left below-knee amputation of the pain continues and if the pain is unmanageable as of now patient would like to hold off on this option because he feels like the CBD oil is helping -Patient to return to office after as needed or sooner if problems or issues arise.  Landis Martins, DPM

## 2018-01-30 ENCOUNTER — Telehealth: Payer: Self-pay | Admitting: Sports Medicine

## 2018-01-30 DIAGNOSIS — E1142 Type 2 diabetes mellitus with diabetic polyneuropathy: Secondary | ICD-10-CM

## 2018-01-30 DIAGNOSIS — M14672 Charcot's joint, left ankle and foot: Secondary | ICD-10-CM

## 2018-01-30 DIAGNOSIS — M25372 Other instability, left ankle: Secondary | ICD-10-CM

## 2018-01-30 NOTE — Telephone Encounter (Signed)
Have the patient to call [facility]. They are taking care of getting this wheelchair for him. Thanks Dr. Chauncey Cruel

## 2018-01-30 NOTE — Telephone Encounter (Signed)
Patient called to see when he would get his electric wheelchair. If you can call patient back at [phone]

## 2018-01-31 NOTE — Telephone Encounter (Signed)
Emailed notification of wheelchair order to [facility] and faxed required forms and orders to [facility].

## 2018-01-31 NOTE — Addendum Note (Signed)
Addended by: Harriett Sine D on: 01/31/2018 09:16 AM   Modules accepted: Orders

## 2018-02-08 DIAGNOSIS — R945 Abnormal results of liver function studies: Secondary | ICD-10-CM | POA: Insufficient documentation

## 2018-02-13 DIAGNOSIS — Z7982 Long term (current) use of aspirin: Secondary | ICD-10-CM | POA: Insufficient documentation

## 2018-02-13 DIAGNOSIS — R079 Chest pain, unspecified: Secondary | ICD-10-CM | POA: Insufficient documentation

## 2018-03-22 ENCOUNTER — Ambulatory Visit: Payer: Medicaid Other | Admitting: Sports Medicine

## 2018-03-22 ENCOUNTER — Ambulatory Visit (INDEPENDENT_AMBULATORY_CARE_PROVIDER_SITE_OTHER): Payer: Medicaid Other

## 2018-03-22 ENCOUNTER — Encounter: Payer: Self-pay | Admitting: Sports Medicine

## 2018-03-22 DIAGNOSIS — M14672 Charcot's joint, left ankle and foot: Secondary | ICD-10-CM

## 2018-03-22 DIAGNOSIS — M659 Synovitis and tenosynovitis, unspecified: Secondary | ICD-10-CM

## 2018-03-22 DIAGNOSIS — M25372 Other instability, left ankle: Secondary | ICD-10-CM

## 2018-03-22 DIAGNOSIS — L97329 Non-pressure chronic ulcer of left ankle with unspecified severity: Secondary | ICD-10-CM

## 2018-03-22 DIAGNOSIS — L97422 Non-pressure chronic ulcer of left heel and midfoot with fat layer exposed: Secondary | ICD-10-CM

## 2018-03-22 DIAGNOSIS — L03119 Cellulitis of unspecified part of limb: Secondary | ICD-10-CM

## 2018-03-22 DIAGNOSIS — E11622 Type 2 diabetes mellitus with other skin ulcer: Secondary | ICD-10-CM

## 2018-03-22 DIAGNOSIS — E11621 Type 2 diabetes mellitus with foot ulcer: Secondary | ICD-10-CM

## 2018-03-22 DIAGNOSIS — L02619 Cutaneous abscess of unspecified foot: Secondary | ICD-10-CM

## 2018-03-22 NOTE — Progress Notes (Signed)
Subjective: Kristopher Simon is a 52 y.o. male patient seen in office for evaluation of left foot and ankle pain.  Patient reports he has been asked experiencing increased swelling at the left ankle since Saturday.  Patient states that his sister looked at his ankle and his heel and states that the heel lifts bruise reports that he has achy pain and was concerned about the swelling.  States that his pain level is 7 out of 10 and he is currently going to pain management who is helping.  Patient states that he has tried using his Ace bandage cam boot icing elevation and walker to help with the swelling.  Patient states that he is not wearing his [name] boot because it feels hot and causes him to hurt sometimes when the swelling is down on his leg and is itchy.  Patient has no other pedal complaints at this time.  Patient Active Problem List   Diagnosis Date Noted  . DKA (diabetic ketoacidoses) ([place]) 07/06/2014  . Cellulitis of left lower extremity 07/06/2014  . Tachycardia with 100 - 120 beats per minute 07/06/2014  . GERD (gastroesophageal reflux disease) 07/06/2014  . Essential hypertension 07/06/2014  . Hyponatremia 07/06/2014  . CAD (coronary artery disease), native coronary artery 07/06/2014  . Acute kidney injury ([place]) 02/05/2012  . Diabetes mellitus ([place]) 02/05/2012  . HTN (hypertension) 02/05/2012  . Nausea & vomiting 02/05/2012  . Shortness of breath 02/05/2012   Current Outpatient Medications on File Prior to Visit  Medication Sig Dispense Refill  . aspirin EC 81 MG tablet Take 81 mg by mouth daily.    Marland Kitchen CINNAMON PO Take 1 tablet by mouth every morning.    . clindamycin (CLEOCIN) 300 MG capsule Take 1 capsule (300 mg total) by mouth 3 (three) times daily. 30 capsule 0  . clobetasol cream (TEMOVATE) 1.19 % Apply 1 application topically daily. Daily at bedtime 60 g 0  . colchicine 0.6 MG tablet Take 1 tablet (0.6 mg total) daily by mouth. 7 tablet 0  . cyclobenzaprine (FLEXERIL) 10 MG  tablet Take 10-20 mg by mouth 2 (two) times daily. 10mg  in the morning and 20mg  at bedtime    . esomeprazole (NEXIUM) 40 MG capsule Take 40 mg by mouth every morning.    . gabapentin (NEURONTIN) 300 MG capsule Take 300 mg by mouth 3 (three) times daily.    . hydrochlorothiazide (HYDRODIURIL) 25 MG tablet Take 25 mg by mouth daily.    . hydrocortisone cream 0.5 % Apply 1 application 2 (two) times daily topically. To red areas on leg as needed 30 g 0  . HYDROmorphone (DILAUDID) 8 MG tablet Take 1 tablet (8 mg total) by mouth every 4 (four) hours as needed for severe pain. 21 tablet 0  . insulin aspart (NOVOLOG FLEXPEN) 100 UNIT/ML FlexPen Inject 30-45 Units into the skin 3 (three) times daily with meals.    . insulin detemir (LEVEMIR) 100 UNIT/ML injection Inject 0.6 mLs (60 Units total) into the skin 2 (two) times daily. 10 mL 11  . methylPREDNISolone (MEDROL DOSEPAK) 4 MG TBPK tablet Take as instructed 21 tablet 0  . metoCLOPramide (REGLAN) 5 MG tablet Take 1 tablet (5 mg total) by mouth 4 (four) times daily -  before meals and at bedtime. (Patient not taking: Reported on 07/06/2014) 120 tablet 0  . metoprolol (LOPRESSOR) 100 MG tablet Take 100 mg by mouth 2 (two) times daily.    . metoprolol (LOPRESSOR) 50 MG tablet Take 50 mg by  mouth 2 (two) times daily.    . mupirocin ointment (BACTROBAN) 2 % To left shin 22 g 0  . nystatin cream (MYCOSTATIN) Apply 1 application topically 3 (three) times daily.    . Probiotic Product (TRUBIOTICS PO) Take 1 tablet by mouth every morning.    . Pyridoxine HCl (B-6 PO) Take 1 tablet by mouth every morning.    . saccharomyces boulardii (FLORASTOR) 250 MG capsule Take 1 capsule (250 mg total) by mouth 2 (two) times daily. 30 capsule 0   Current Facility-Administered Medications on File Prior to Visit  Medication Dose Route Frequency Provider Last Rate Last Dose  . triamcinolone acetonide (KENALOG) 10 MG/ML injection 10 mg  10 mg Other Once Landis Martins, DPM        Allergies  Allergen Reactions  . Bactrim [Sulfamethoxazole-Trimethoprim] Swelling    "tongue swelling"  . Ibuprofen     Kidney issues    No results found for this or any previous visit (from the past 2160 hour(s)).  Objective: There were no vitals filed for this visit.  General: Patient is awake, alert, oriented x 3 and in no acute distress.  Physical exam:  Right great toe dry blood underneath hallux nail that is stable with no surrounding signs of infection, on the left foot there is moderate tenderness palpation to heel and lateral ankle with 3 small superficial partial-thickness granular ulceration that measures less than 1 cm there is mild blanchable erythema to this area however swelling is very significant and mildly warm concerning for acute Charcot flare, + venous stasis on left with mild venous skin irritation/rash anterior lower leg, no signs of cellulitis, Capillary fill intact to all toes. No pain with compression to calves bilateral. 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    X-rays left ankle consistent with osseous destruction and displacement and collapse supportive of Charcot deformity from the level of the midfoot to the ankle with surrounding soft tissue swelling.  No other acute findings.  Assessment and Plan:  Problem List Items Addressed This Visit    None    Visit Diagnoses    Charcot's joint of ankle, left    -  Primary   Relevant Orders   DG Ankle Complete Left   WOUND CULTURE   Unstable left ankle       Relevant Orders   DG Ankle Complete Left   WOUND CULTURE   Tenosynovitis of ankle       Relevant Orders   DG Ankle Complete Left   WOUND CULTURE   Instability of ankle joint, left       Relevant Orders   DG Ankle Complete Left   WOUND CULTURE   Diabetic ulcer of left midfoot associated with type 2 diabetes mellitus, with fat layer exposed ([place])        Cellulitis and abscess of foot, except toes       Relevant Orders   WOUND CULTURE   Diabetic ulcer of left ankle ([place])       Relevant Orders   WOUND CULTURE     -Examined patient  -X-rays reviewed -Discussed with patient acute Charcot flare on left ankle with partial thickness wounds -Wound culture was obtained from left lateral ankle wounds will call patient if oral antibiotics are needed -Applied a Unna boot to the left lower leg and ankle to help for support and stability and to assist with  edema control in the setting of an acute Charcot flare advised patient to keep this dressing in place for 1 week to come to office to have it removed -Continue with cam boot -Continue with pain management -Recommend patient to rest and to elevate the foot and lower leg -Continue with rolling walker for stability in gait -Awaiting electric wheelchair -Advised patient again to closely monitor right great toenail to allow blood to slowly grow out -Patient to return to office in 1 week or sooner if problems or issues arise.  Landis Martins, DPM

## 2018-03-23 ENCOUNTER — Other Ambulatory Visit: Payer: Self-pay | Admitting: Sports Medicine

## 2018-03-23 DIAGNOSIS — L97329 Non-pressure chronic ulcer of left ankle with unspecified severity: Secondary | ICD-10-CM

## 2018-03-23 DIAGNOSIS — L02619 Cutaneous abscess of unspecified foot: Secondary | ICD-10-CM

## 2018-03-23 DIAGNOSIS — E11622 Type 2 diabetes mellitus with other skin ulcer: Secondary | ICD-10-CM

## 2018-03-23 DIAGNOSIS — M25372 Other instability, left ankle: Secondary | ICD-10-CM

## 2018-03-23 DIAGNOSIS — M14672 Charcot's joint, left ankle and foot: Secondary | ICD-10-CM

## 2018-03-23 DIAGNOSIS — M659 Synovitis and tenosynovitis, unspecified: Secondary | ICD-10-CM

## 2018-03-23 DIAGNOSIS — L03119 Cellulitis of unspecified part of limb: Secondary | ICD-10-CM

## 2018-03-26 ENCOUNTER — Telehealth: Payer: Self-pay | Admitting: *Deleted

## 2018-03-26 LAB — WOUND CULTURE

## 2018-03-26 MED ORDER — CIPROFLOXACIN HCL 500 MG PO TABS: 500.0000 mg | ORAL_TABLET | Freq: Two times a day (BID) | ORAL | 0 refills | Status: DC

## 2018-03-26 NOTE — Telephone Encounter (Signed)
-----   Message from Landis Martins, [place] sent at 03/26/2018  5:18 PM EDT ----- + culture for Psuedomonas  Please send Cipro 500 bid x 10 days Thanks  Dr. Chauncey Cruel

## 2018-03-28 ENCOUNTER — Ambulatory Visit (INDEPENDENT_AMBULATORY_CARE_PROVIDER_SITE_OTHER): Payer: Medicaid Other | Admitting: Sports Medicine

## 2018-03-28 ENCOUNTER — Encounter: Payer: Self-pay | Admitting: Sports Medicine

## 2018-03-28 VITALS — BP 131/84 | HR 84 | Resp 15

## 2018-03-28 DIAGNOSIS — M14672 Charcot's joint, left ankle and foot: Secondary | ICD-10-CM | POA: Diagnosis not present

## 2018-03-28 DIAGNOSIS — M25372 Other instability, left ankle: Secondary | ICD-10-CM

## 2018-03-28 DIAGNOSIS — M659 Synovitis and tenosynovitis, unspecified: Secondary | ICD-10-CM

## 2018-03-28 DIAGNOSIS — L03119 Cellulitis of unspecified part of limb: Secondary | ICD-10-CM

## 2018-03-28 DIAGNOSIS — E11622 Type 2 diabetes mellitus with other skin ulcer: Secondary | ICD-10-CM

## 2018-03-28 DIAGNOSIS — L02619 Cutaneous abscess of unspecified foot: Secondary | ICD-10-CM

## 2018-03-28 DIAGNOSIS — L97329 Non-pressure chronic ulcer of left ankle with unspecified severity: Principal | ICD-10-CM

## 2018-03-28 NOTE — Progress Notes (Signed)
Subjective: Kristopher Simon is a 52 y.o. male patient seen in office for evaluation of left foot and ankle pain.  Patient reports that the pain is about the same.  Patient started on his antibiotic on yesterday and reports that he does feel like there was a little bit more drainage when my nurse removed his bandage today in office.  However patient denies nausea, vomiting, fever, chills or any other constitutional symptoms at this time.  Patient has no other pedal complaints at this time.  Patient Active Problem List   Diagnosis Date Noted  . DKA (diabetic ketoacidoses) ([place]) 07/06/2014  . Cellulitis of left lower extremity 07/06/2014  . Tachycardia with 100 - 120 beats per minute 07/06/2014  . GERD (gastroesophageal reflux disease) 07/06/2014  . Essential hypertension 07/06/2014  . Hyponatremia 07/06/2014  . CAD (coronary artery disease), native coronary artery 07/06/2014  . Acute kidney injury ([place]) 02/05/2012  . Diabetes mellitus ([place]) 02/05/2012  . HTN (hypertension) 02/05/2012  . Nausea & vomiting 02/05/2012  . Shortness of breath 02/05/2012   Current Outpatient Medications on File Prior to Visit  Medication Sig Dispense Refill  . aspirin EC 81 MG tablet Take 81 mg by mouth daily.    Marland Kitchen CINNAMON PO Take 1 tablet by mouth every morning.    . ciprofloxacin (CIPRO) 500 MG tablet Take 1 tablet (500 mg total) by mouth 2 (two) times daily. 20 tablet 0  . clindamycin (CLEOCIN) 300 MG capsule Take 1 capsule (300 mg total) by mouth 3 (three) times daily. 30 capsule 0  . clobetasol cream (TEMOVATE) 5.17 % Apply 1 application topically daily. Daily at bedtime 60 g 0  . colchicine 0.6 MG tablet Take 1 tablet (0.6 mg total) daily by mouth. 7 tablet 0  . cyclobenzaprine (FLEXERIL) 10 MG tablet Take 10-20 mg by mouth 2 (two) times daily. 10mg  in the morning and 20mg  at bedtime    . esomeprazole (NEXIUM) 40 MG capsule Take 40 mg by mouth every morning.    . gabapentin (NEURONTIN) 300 MG capsule Take  300 mg by mouth 3 (three) times daily.    . hydrochlorothiazide (HYDRODIURIL) 25 MG tablet Take 25 mg by mouth daily.    . hydrocortisone cream 0.5 % Apply 1 application 2 (two) times daily topically. To red areas on leg as needed 30 g 0  . HYDROmorphone (DILAUDID) 8 MG tablet Take 1 tablet (8 mg total) by mouth every 4 (four) hours as needed for severe pain. 21 tablet 0  . insulin aspart (NOVOLOG FLEXPEN) 100 UNIT/ML FlexPen Inject 30-45 Units into the skin 3 (three) times daily with meals.    . insulin detemir (LEVEMIR) 100 UNIT/ML injection Inject 0.6 mLs (60 Units total) into the skin 2 (two) times daily. 10 mL 11  . methylPREDNISolone (MEDROL DOSEPAK) 4 MG TBPK tablet Take as instructed 21 tablet 0  . metoCLOPramide (REGLAN) 5 MG tablet Take 1 tablet (5 mg total) by mouth 4 (four) times daily -  before meals and at bedtime. (Patient not taking: Reported on 07/06/2014) 120 tablet 0  . metoprolol (LOPRESSOR) 100 MG tablet Take 100 mg by mouth 2 (two) times daily.    . metoprolol (LOPRESSOR) 50 MG tablet Take 50 mg by mouth 2 (two) times daily.    . mupirocin ointment (BACTROBAN) 2 % To left shin 22 g 0  . nystatin cream (MYCOSTATIN) Apply 1 application topically 3 (three) times daily.    . Probiotic Product (TRUBIOTICS PO) Take 1 tablet by  mouth every morning.    . Pyridoxine HCl (B-6 PO) Take 1 tablet by mouth every morning.    . saccharomyces boulardii (FLORASTOR) 250 MG capsule Take 1 capsule (250 mg total) by mouth 2 (two) times daily. 30 capsule 0   Current Facility-Administered Medications on File Prior to Visit  Medication Dose Route Frequency Provider Last Rate Last Dose  . triamcinolone acetonide (KENALOG) 10 MG/ML injection 10 mg  10 mg Other Once Landis Martins, DPM       Allergies  Allergen Reactions  . Bactrim [Sulfamethoxazole-Trimethoprim] Swelling    "tongue swelling"  . Ibuprofen     Kidney issues    Recent Results (from the past 2160 hour(s))  WOUND CULTURE      Status: Abnormal   Collection Time: 03/22/18 11:28 AM  Result Value Ref Range   Gram Stain Result Final report    Organism ID, Bacteria Comment     Comment: No white blood cells seen.   Organism ID, Bacteria Comment     Comment: Few gram negative diplococci.   Organism ID, Bacteria Comment     Comment: Moderate gram negative rods.   Organism ID, Bacteria Comment     Comment: Moderate number of gram positive cocci.   Aerobic Bacterial Culture Final report (A)    Organism ID, Bacteria Comment (A)     Comment: Pseudomonas aeruginosa Moderate growth    Organism ID, Bacteria Comment (A)     Comment: Beta hemolytic Streptococcus, group B Moderate growth Penicillin and ampicillin are drugs of choice for treatment of beta-hemolytic streptococcal infections. Susceptibility testing of penicillins and other beta-lactam agents approved by the FDA for treatment of beta-hemolytic streptococcal infections need not be performed routinely because nonsusceptible isolates are extremely rare in any beta-hemolytic streptococcus and have not been reported for Streptococcus pyogenes (group A). (CLSI)    Organism ID, Bacteria Routine flora     Comment: Heavy growth   Antimicrobial Susceptibility Comment     Comment:       ** S = Susceptible; I = Intermediate; R = Resistant **                    P = Positive; N = Negative             MICS are expressed in micrograms per mL    Antibiotic                 RSLT#1    RSLT#2    RSLT#3    RSLT#4 Amikacin                       S Cefepime                       S Ceftazidime                    S Ciprofloxacin                  S Gentamicin                     S Imipenem                       S Levofloxacin                   S Meropenem  S Piperacillin                   S Ticarcillin                    S Tobramycin                     S     Objective: There were no vitals filed for this visit.  General: Patient is awake, alert,  oriented x 3 and in no acute distress.  Physical exam:  Right great toe dry blood underneath hallux nail that is stable with no surrounding signs of infection, on the left foot there is moderate tenderness palpation to heel and lateral ankle with 3 small superficial partial-thickness granular ulceration that measures less than 1 cm as previous with the central wound still open and the other to use scabbed over, there is mild blanchable erythema to this area however swelling is very significant and mildly warm concerning for acute Charcot flare, + venous stasis on left with mild venous skin irritation/rash anterior lower leg, no signs of cellulitis, Capillary fill intact to all toes. No pain with compression to calves bilateral. 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[place])    -  Primary   Charcot's joint of ankle, left       Unstable left ankle       Tenosynovitis of ankle       Instability of ankle joint, left       Cellulitis and abscess of foot, except toes         -Examined patient  -Discussed with patient continued care for acute Charcot flare on left ankle with partial thickness wound at lateral ankle -Cleansed with saline and applied Iodosorb and advised patient to have sister to help change dressing every other day -Continue with oral antibiotic for Pseudomonas -Continue with cam boot -Continue with pain management -Recommend patient to rest and to elevate the foot and lower leg -Continue with rolling walker for stability in gait. patient is awaiting electric chair. -Advised patient if pain and instability continues would recommend reconsult orthopedics likely [facility] since [place] did not want to take outpatient case versus considering  amputation -Patient to return to office in 3 weeks or sooner if problems or issues arise.  Landis Martins, DPM

## 2018-04-11 ENCOUNTER — Ambulatory Visit: Payer: Medicaid Other | Admitting: Sports Medicine

## 2018-04-19 ENCOUNTER — Encounter: Payer: Self-pay | Admitting: Sports Medicine

## 2018-04-19 ENCOUNTER — Ambulatory Visit (INDEPENDENT_AMBULATORY_CARE_PROVIDER_SITE_OTHER): Payer: Medicaid Other | Admitting: Sports Medicine

## 2018-04-19 VITALS — BP 157/86 | HR 92 | Resp 15

## 2018-04-19 DIAGNOSIS — M25372 Other instability, left ankle: Secondary | ICD-10-CM

## 2018-04-19 DIAGNOSIS — M659 Synovitis and tenosynovitis, unspecified: Secondary | ICD-10-CM

## 2018-04-19 DIAGNOSIS — E11622 Type 2 diabetes mellitus with other skin ulcer: Secondary | ICD-10-CM

## 2018-04-19 DIAGNOSIS — M14672 Charcot's joint, left ankle and foot: Secondary | ICD-10-CM

## 2018-04-19 DIAGNOSIS — E1142 Type 2 diabetes mellitus with diabetic polyneuropathy: Secondary | ICD-10-CM

## 2018-04-19 DIAGNOSIS — L97329 Non-pressure chronic ulcer of left ankle with unspecified severity: Secondary | ICD-10-CM

## 2018-04-19 NOTE — Progress Notes (Signed)
Subjective: Kristopher Simon is a 52 y.o. male patient seen in office for follow-up evaluation of left foot and ankle pain.  Patient reports that the pain is better but it still hurts when he does a lot of walking but overall feels better about the status of his foot and ankle does not hurt when he is resting or sitting.  Patient denies nausea, vomiting, fever, chills or any other constitutional symptoms at this time.  Patient has his sister helping him dress the wound at the side of his ankle with no issues.  Patient reports that his [name] boot sometimes does not fit properly so he prefers to wear the cam boot.  No other pedal complaints at this time.  Patient Active Problem List   Diagnosis Date Noted  . DKA (diabetic ketoacidoses) ([place]) 07/06/2014  . Cellulitis of left lower extremity 07/06/2014  . Tachycardia with 100 - 120 beats per minute 07/06/2014  . GERD (gastroesophageal reflux disease) 07/06/2014  . Essential hypertension 07/06/2014  . Hyponatremia 07/06/2014  . CAD (coronary artery disease), native coronary artery 07/06/2014  . Acute kidney injury ([place]) 02/05/2012  . Diabetes mellitus ([place]) 02/05/2012  . HTN (hypertension) 02/05/2012  . Nausea & vomiting 02/05/2012  . Shortness of breath 02/05/2012   Current Outpatient Medications on File Prior to Visit  Medication Sig Dispense Refill  . aspirin EC 81 MG tablet Take 81 mg by mouth daily.    Marland Kitchen CINNAMON PO Take 1 tablet by mouth every morning.    . ciprofloxacin (CIPRO) 500 MG tablet Take 1 tablet (500 mg total) by mouth 2 (two) times daily. (Patient not taking: Reported on 03/28/2018) 20 tablet 0  . clindamycin (CLEOCIN) 300 MG capsule Take 1 capsule (300 mg total) by mouth 3 (three) times daily. (Patient not taking: Reported on 03/28/2018) 30 capsule 0  . clobetasol cream (TEMOVATE) 2.35 % Apply 1 application topically daily. Daily at bedtime (Patient not taking: Reported on 03/28/2018) 60 g 0  . colchicine 0.6 MG tablet Take 1 tablet  (0.6 mg total) daily by mouth. (Patient not taking: Reported on 03/28/2018) 7 tablet 0  . cyclobenzaprine (FLEXERIL) 10 MG tablet Take 10-20 mg by mouth 2 (two) times daily. 10mg  in the morning and 20mg  at bedtime    . esomeprazole (NEXIUM) 40 MG capsule Take 40 mg by mouth every morning.    . gabapentin (NEURONTIN) 300 MG capsule Take 300 mg by mouth 3 (three) times daily.    . hydrochlorothiazide (HYDRODIURIL) 25 MG tablet Take 25 mg by mouth daily.    . hydrocortisone cream 0.5 % Apply 1 application 2 (two) times daily topically. To red areas on leg as needed (Patient not taking: Reported on 03/28/2018) 30 g 0  . HYDROmorphone (DILAUDID) 8 MG tablet Take 1 tablet (8 mg total) by mouth every 4 (four) hours as needed for severe pain. (Patient not taking: Reported on 03/28/2018) 21 tablet 0  . insulin aspart (NOVOLOG FLEXPEN) 100 UNIT/ML FlexPen Inject 30-45 Units into the skin 3 (three) times daily with meals.    . insulin detemir (LEVEMIR) 100 UNIT/ML injection Inject 0.6 mLs (60 Units total) into the skin 2 (two) times daily. (Patient not taking: Reported on 03/28/2018) 10 mL 11  . methylPREDNISolone (MEDROL DOSEPAK) 4 MG TBPK tablet Take as instructed (Patient not taking: Reported on 03/28/2018) 21 tablet 0  . metoCLOPramide (REGLAN) 5 MG tablet Take 1 tablet (5 mg total) by mouth 4 (four) times daily -  before meals and at  bedtime. (Patient not taking: Reported on 07/06/2014) 120 tablet 0  . metoprolol (LOPRESSOR) 100 MG tablet Take 100 mg by mouth 2 (two) times daily.    . metoprolol (LOPRESSOR) 50 MG tablet Take 50 mg by mouth 2 (two) times daily.    . mupirocin ointment (BACTROBAN) 2 % To left shin (Patient not taking: Reported on 03/28/2018) 22 g 0  . nystatin cream (MYCOSTATIN) Apply 1 application topically 3 (three) times daily.    . Probiotic Product (TRUBIOTICS PO) Take 1 tablet by mouth every morning.    . Pyridoxine HCl (B-6 PO) Take 1 tablet by mouth every morning.    . saccharomyces boulardii  (FLORASTOR) 250 MG capsule Take 1 capsule (250 mg total) by mouth 2 (two) times daily. (Patient not taking: Reported on 03/28/2018) 30 capsule 0   Current Facility-Administered Medications on File Prior to Visit  Medication Dose Route Frequency Provider Last Rate Last Dose  . triamcinolone acetonide (KENALOG) 10 MG/ML injection 10 mg  10 mg Other Once Landis Martins, DPM       Allergies  Allergen Reactions  . Bactrim [Sulfamethoxazole-Trimethoprim] Swelling    "tongue swelling"  . Ibuprofen     Kidney issues    Recent Results (from the past 2160 hour(s))  WOUND CULTURE     Status: Abnormal   Collection Time: 03/22/18 11:28 AM  Result Value Ref Range   Gram Stain Result Final report    Organism ID, Bacteria Comment     Comment: No white blood cells seen.   Organism ID, Bacteria Comment     Comment: Few gram negative diplococci.   Organism ID, Bacteria Comment     Comment: Moderate gram negative rods.   Organism ID, Bacteria Comment     Comment: Moderate number of gram positive cocci.   Aerobic Bacterial Culture Final report (A)    Organism ID, Bacteria Comment (A)     Comment: Pseudomonas aeruginosa Moderate growth    Organism ID, Bacteria Comment (A)     Comment: Beta hemolytic Streptococcus, group B Moderate growth Penicillin and ampicillin are drugs of choice for treatment of beta-hemolytic streptococcal infections. Susceptibility testing of penicillins and other beta-lactam agents approved by the FDA for treatment of beta-hemolytic streptococcal infections need not be performed routinely because nonsusceptible isolates are extremely rare in any beta-hemolytic streptococcus and have not been reported for Streptococcus pyogenes (group A). (CLSI)    Organism ID, Bacteria Routine flora     Comment: Heavy growth   Antimicrobial Susceptibility Comment     Comment:       ** S = Susceptible; I = Intermediate; R = Resistant **                    P = Positive; N = Negative              MICS are expressed in micrograms per mL    Antibiotic                 RSLT#1    RSLT#2    RSLT#3    RSLT#4 Amikacin                       S Cefepime                       S Ceftazidime                    S  Ciprofloxacin                  S Gentamicin                     S Imipenem                       S Levofloxacin                   S Meropenem                      S Piperacillin                   S Ticarcillin                    S Tobramycin                     S     Objective: There were no vitals filed for this visit.  General: Patient is awake, alert, oriented x 3 and in no acute distress.  Physical exam: To the left foot there is decreased tenderness palpation to heel and lateral ankle with a focal lateral ankle ulceration that measures 2 cm x 2 cm with a granular base, there is mild periwound maceration and mild blanchable erythema.  Previous ulcerations that were present last visit have now healed over.  + venous stasis on left with mild venous skin irritation/rash anterior lower leg, no signs of cellulitis, Capillary fill intact to all toes. No pain with compression to calves bilateral. 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  Recommend patient to have sister to help change dressing every other day using the Iodosorb he has at home if he runs out may resume  using Bactroban cream which he has at home -Continue with cam boot -Continue with pain management -Recommend patient to rest and to elevate the foot and lower leg -Continue with rolling walker for stability in gait.  Patient still is awaiting electric wheelchair. -Advised patient if area worsens to return to office or report to ER -Patient to return to office in 3-4 weeks or sooner if problems or issues arise.  Landis Martins, DPM

## 2018-04-26 DIAGNOSIS — I639 Cerebral infarction, unspecified: Secondary | ICD-10-CM | POA: Insufficient documentation

## 2018-04-26 HISTORY — DX: Cerebral infarction, unspecified: I63.9

## 2018-05-17 ENCOUNTER — Encounter: Payer: Self-pay | Admitting: Sports Medicine

## 2018-05-17 ENCOUNTER — Ambulatory Visit: Payer: Medicaid Other | Admitting: Sports Medicine

## 2018-05-17 VITALS — BP 133/73 | HR 82 | Temp 98.4°F | Resp 16

## 2018-05-17 DIAGNOSIS — M65979 Unspecified synovitis and tenosynovitis, unspecified ankle and foot: Secondary | ICD-10-CM

## 2018-05-17 DIAGNOSIS — M659 Synovitis and tenosynovitis, unspecified: Secondary | ICD-10-CM

## 2018-05-17 DIAGNOSIS — M25372 Other instability, left ankle: Secondary | ICD-10-CM

## 2018-05-17 DIAGNOSIS — E1142 Type 2 diabetes mellitus with diabetic polyneuropathy: Secondary | ICD-10-CM

## 2018-05-17 DIAGNOSIS — L97329 Non-pressure chronic ulcer of left ankle with unspecified severity: Principal | ICD-10-CM

## 2018-05-17 DIAGNOSIS — M14672 Charcot's joint, left ankle and foot: Secondary | ICD-10-CM | POA: Diagnosis not present

## 2018-05-17 DIAGNOSIS — L02619 Cutaneous abscess of unspecified foot: Secondary | ICD-10-CM

## 2018-05-17 DIAGNOSIS — E11622 Type 2 diabetes mellitus with other skin ulcer: Secondary | ICD-10-CM

## 2018-05-17 DIAGNOSIS — L03119 Cellulitis of unspecified part of limb: Secondary | ICD-10-CM
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
## 2018-05-22 LAB — WOUND CULTURE

## 2018-05-23 ENCOUNTER — Telehealth: Payer: Self-pay | Admitting: *Deleted

## 2018-05-23 MED ORDER — LEVOFLOXACIN 500 MG PO TABS: 500.0000 mg | ORAL_TABLET | Freq: Every day | ORAL | 0 refills | Status: DC

## 2018-05-23 NOTE — Telephone Encounter (Signed)
-----   Message from Landis Martins, [place] sent at 05/22/2018  5:58 PM EDT ----- Send Levaquin 500mg  daily to patient pharmacy x 10 days -Dr. Cannon Kettle

## 2018-05-23 NOTE — Telephone Encounter (Signed)
I called pt and informed of Dr. Leeanne Rio orders 05/22/2018 5:58pm.

## 2018-05-30 ENCOUNTER — Encounter: Payer: Self-pay | Admitting: Sports Medicine

## 2018-05-30 ENCOUNTER — Telehealth: Payer: Self-pay | Admitting: *Deleted

## 2018-05-30 ENCOUNTER — Ambulatory Visit: Payer: Medicaid Other | Admitting: Sports Medicine

## 2018-05-30 VITALS — BP 111/62 | HR 91 | Temp 98.3°F | Resp 16

## 2018-05-30 DIAGNOSIS — M14672 Charcot's joint, left ankle and foot: Secondary | ICD-10-CM

## 2018-05-30 DIAGNOSIS — L03119 Cellulitis of unspecified part of limb: Secondary | ICD-10-CM

## 2018-05-30 DIAGNOSIS — E11622 Type 2 diabetes mellitus with other skin ulcer: Secondary | ICD-10-CM

## 2018-05-30 DIAGNOSIS — L02619 Cutaneous abscess of unspecified foot: Secondary | ICD-10-CM

## 2018-05-30 DIAGNOSIS — L97329 Non-pressure chronic ulcer of left ankle with unspecified severity: Principal | ICD-10-CM

## 2018-05-30 DIAGNOSIS — M25372 Other instability, left ankle: Secondary | ICD-10-CM

## 2018-05-30 MED ORDER — GENTAMICIN SULFATE 0.1 % EX OINT: TOPICAL_OINTMENT | CUTANEOUS | 0 refills | Status: DC

## 2018-05-30 NOTE — Telephone Encounter (Signed)
Faxed required form, and demographics to [facility].
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
## 2018-05-30 NOTE — Telephone Encounter (Signed)
-----   Message from Landis Martins, [place] sent at 05/30/2018 10:53 AM EDT ----- Regarding: [facility] wound care supplies  4x4, abd, kerlix and coban Left ankle ulcers x2 2.5x2x0.5cm 0.5x0.5cxm

## 2018-06-13 ENCOUNTER — Encounter: Payer: Self-pay | Admitting: Sports Medicine

## 2018-06-13 ENCOUNTER — Ambulatory Visit: Payer: Medicaid Other | Admitting: Sports Medicine

## 2018-06-13 VITALS — Temp 99.1°F

## 2018-06-13 DIAGNOSIS — L02619 Cutaneous abscess of unspecified foot: Secondary | ICD-10-CM

## 2018-06-13 DIAGNOSIS — L97329 Non-pressure chronic ulcer of left ankle with unspecified severity: Principal | ICD-10-CM

## 2018-06-13 DIAGNOSIS — E11622 Type 2 diabetes mellitus with other skin ulcer: Secondary | ICD-10-CM

## 2018-06-13 DIAGNOSIS — E1142 Type 2 diabetes mellitus with diabetic polyneuropathy: Secondary | ICD-10-CM | POA: Diagnosis not present

## 2018-06-13 DIAGNOSIS — M14672 Charcot's joint, left ankle and foot: Secondary | ICD-10-CM | POA: Diagnosis not present

## 2018-06-13 DIAGNOSIS — L03119 Cellulitis of unspecified part of limb: Secondary | ICD-10-CM

## 2018-06-13 DIAGNOSIS — M25372 Other instability, left ankle: Secondary | ICD-10-CM

## 2018-06-13 DIAGNOSIS — M65979 Unspecified synovitis and tenosynovitis, unspecified ankle and foot: Secondary | ICD-10-CM

## 2018-06-13 DIAGNOSIS — M659 Synovitis and tenosynovitis, unspecified: Secondary | ICD-10-CM
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
## 2018-06-27 ENCOUNTER — Ambulatory Visit: Payer: Medicaid Other | Admitting: Sports Medicine

## 2018-06-27 ENCOUNTER — Encounter: Payer: Self-pay | Admitting: Sports Medicine

## 2018-06-27 VITALS — BP 124/72 | HR 87 | Temp 98.3°F | Resp 16

## 2018-06-27 DIAGNOSIS — L03119 Cellulitis of unspecified part of limb: Secondary | ICD-10-CM

## 2018-06-27 DIAGNOSIS — E11622 Type 2 diabetes mellitus with other skin ulcer: Secondary | ICD-10-CM

## 2018-06-27 DIAGNOSIS — M25372 Other instability, left ankle: Secondary | ICD-10-CM

## 2018-06-27 DIAGNOSIS — M14672 Charcot's joint, left ankle and foot: Secondary | ICD-10-CM | POA: Diagnosis not present

## 2018-06-27 DIAGNOSIS — L02619 Cutaneous abscess of unspecified foot: Secondary | ICD-10-CM

## 2018-06-27 DIAGNOSIS — L97329 Non-pressure chronic ulcer of left ankle with unspecified severity: Principal | ICD-10-CM
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
## 2018-07-11 ENCOUNTER — Ambulatory Visit: Payer: Medicaid Other | Admitting: Sports Medicine

## 2018-07-11 ENCOUNTER — Encounter: Payer: Self-pay | Admitting: Sports Medicine

## 2018-07-11 VITALS — BP 144/82 | HR 87 | Temp 98.7°F | Resp 16

## 2018-07-11 DIAGNOSIS — E1142 Type 2 diabetes mellitus with diabetic polyneuropathy: Secondary | ICD-10-CM

## 2018-07-11 DIAGNOSIS — M25372 Other instability, left ankle: Secondary | ICD-10-CM

## 2018-07-11 DIAGNOSIS — L97329 Non-pressure chronic ulcer of left ankle with unspecified severity: Principal | ICD-10-CM

## 2018-07-11 DIAGNOSIS — E11622 Type 2 diabetes mellitus with other skin ulcer: Secondary | ICD-10-CM

## 2018-07-11 DIAGNOSIS — M14672 Charcot's joint, left ankle and foot: Secondary | ICD-10-CM | POA: Diagnosis not present
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
## 2018-08-02 ENCOUNTER — Ambulatory Visit (INDEPENDENT_AMBULATORY_CARE_PROVIDER_SITE_OTHER): Payer: Medicaid Other | Admitting: Sports Medicine

## 2018-08-02 ENCOUNTER — Encounter: Payer: Self-pay | Admitting: Sports Medicine

## 2018-08-02 VITALS — BP 124/76 | HR 82 | Temp 98.2°F | Resp 16

## 2018-08-02 DIAGNOSIS — I878 Other specified disorders of veins: Secondary | ICD-10-CM

## 2018-08-02 DIAGNOSIS — E1142 Type 2 diabetes mellitus with diabetic polyneuropathy: Secondary | ICD-10-CM | POA: Diagnosis not present

## 2018-08-02 DIAGNOSIS — L97326 Non-pressure chronic ulcer of left ankle with bone involvement without evidence of necrosis: Secondary | ICD-10-CM

## 2018-08-02 DIAGNOSIS — M14672 Charcot's joint, left ankle and foot: Secondary | ICD-10-CM

## 2018-08-02 DIAGNOSIS — M25372 Other instability, left ankle: Secondary | ICD-10-CM

## 2018-08-02 DIAGNOSIS — E11622 Type 2 diabetes mellitus with other skin ulcer: Secondary | ICD-10-CM

## 2018-08-02 DIAGNOSIS — L97329 Non-pressure chronic ulcer of left ankle with unspecified severity: Principal | ICD-10-CM

## 2018-08-02 DIAGNOSIS — I739 Peripheral vascular disease, unspecified: Secondary | ICD-10-CM
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
## 2018-08-24 ENCOUNTER — Encounter: Payer: Self-pay | Admitting: Sports Medicine

## 2018-08-24 ENCOUNTER — Ambulatory Visit: Payer: Medicaid Other | Admitting: Sports Medicine

## 2018-08-24 DIAGNOSIS — E11622 Type 2 diabetes mellitus with other skin ulcer: Secondary | ICD-10-CM

## 2018-08-24 DIAGNOSIS — I739 Peripheral vascular disease, unspecified: Secondary | ICD-10-CM

## 2018-08-24 DIAGNOSIS — L97329 Non-pressure chronic ulcer of left ankle with unspecified severity: Secondary | ICD-10-CM

## 2018-08-24 DIAGNOSIS — E1142 Type 2 diabetes mellitus with diabetic polyneuropathy: Secondary | ICD-10-CM | POA: Diagnosis not present

## 2018-08-24 DIAGNOSIS — M14672 Charcot's joint, left ankle and foot: Secondary | ICD-10-CM

## 2018-08-24 DIAGNOSIS — M25372 Other instability, left ankle: Secondary | ICD-10-CM | POA: Diagnosis not present

## 2018-08-24 DIAGNOSIS — I878 Other specified disorders of veins: Secondary | ICD-10-CM
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
## 2018-09-07 ENCOUNTER — Encounter: Payer: Self-pay | Admitting: Sports Medicine

## 2018-09-07 ENCOUNTER — Ambulatory Visit: Payer: Medicaid Other | Admitting: Sports Medicine

## 2018-09-07 VITALS — BP 153/85 | HR 90 | Temp 98.6°F | Resp 16

## 2018-09-07 DIAGNOSIS — I739 Peripheral vascular disease, unspecified: Secondary | ICD-10-CM

## 2018-09-07 DIAGNOSIS — E11622 Type 2 diabetes mellitus with other skin ulcer: Secondary | ICD-10-CM | POA: Diagnosis not present

## 2018-09-07 DIAGNOSIS — M25372 Other instability, left ankle: Secondary | ICD-10-CM | POA: Diagnosis not present

## 2018-09-07 DIAGNOSIS — L97329 Non-pressure chronic ulcer of left ankle with unspecified severity: Secondary | ICD-10-CM

## 2018-09-07 DIAGNOSIS — M14672 Charcot's joint, left ankle and foot: Secondary | ICD-10-CM

## 2018-09-07 DIAGNOSIS — E1142 Type 2 diabetes mellitus with diabetic polyneuropathy: Secondary | ICD-10-CM | POA: Diagnosis not present

## 2018-09-07 DIAGNOSIS — I878 Other specified disorders of veins: Secondary | ICD-10-CM
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
## 2018-09-12 ENCOUNTER — Inpatient Hospital Stay (HOSPITAL_COMMUNITY)
Admission: AD | Admit: 2018-09-12 | Discharge: 2018-09-17 | DRG: 041 | Disposition: A | Discharge status: Rehabilitation Facility | Payer: Medicaid Other | Source: Other Acute Inpatient Hospital | Attending: Internal Medicine | Admitting: Internal Medicine

## 2018-09-12 DIAGNOSIS — E440 Moderate protein-calorie malnutrition: Secondary | ICD-10-CM | POA: Diagnosis present

## 2018-09-12 DIAGNOSIS — L02612 Cutaneous abscess of left foot: Secondary | ICD-10-CM | POA: Diagnosis present

## 2018-09-12 DIAGNOSIS — Z7982 Long term (current) use of aspirin: Secondary | ICD-10-CM | POA: Diagnosis not present

## 2018-09-12 DIAGNOSIS — K5900 Constipation, unspecified: Secondary | ICD-10-CM | POA: Diagnosis not present

## 2018-09-12 DIAGNOSIS — Z882 Allergy status to sulfonamides status: Secondary | ICD-10-CM

## 2018-09-12 DIAGNOSIS — Z8249 Family history of ischemic heart disease and other diseases of the circulatory system: Secondary | ICD-10-CM

## 2018-09-12 DIAGNOSIS — Z955 Presence of coronary angioplasty implant and graft: Secondary | ICD-10-CM | POA: Diagnosis not present

## 2018-09-12 DIAGNOSIS — Z7902 Long term (current) use of antithrombotics/antiplatelets: Secondary | ICD-10-CM

## 2018-09-12 DIAGNOSIS — I2510 Atherosclerotic heart disease of native coronary artery without angina pectoris: Secondary | ICD-10-CM | POA: Diagnosis present

## 2018-09-12 DIAGNOSIS — E785 Hyperlipidemia, unspecified: Secondary | ICD-10-CM | POA: Diagnosis present

## 2018-09-12 DIAGNOSIS — E1169 Type 2 diabetes mellitus with other specified complication: Secondary | ICD-10-CM | POA: Diagnosis present

## 2018-09-12 DIAGNOSIS — E11610 Type 2 diabetes mellitus with diabetic neuropathic arthropathy: Secondary | ICD-10-CM

## 2018-09-12 DIAGNOSIS — Z6841 Body Mass Index (BMI) 40.0 and over, adult: Secondary | ICD-10-CM | POA: Diagnosis not present

## 2018-09-12 DIAGNOSIS — Z820 Family history of epilepsy and other diseases of the nervous system: Secondary | ICD-10-CM | POA: Diagnosis not present

## 2018-09-12 DIAGNOSIS — E11621 Type 2 diabetes mellitus with foot ulcer: Secondary | ICD-10-CM

## 2018-09-12 DIAGNOSIS — M86672 Other chronic osteomyelitis, left ankle and foot: Secondary | ICD-10-CM | POA: Diagnosis present

## 2018-09-12 DIAGNOSIS — M797 Fibromyalgia: Secondary | ICD-10-CM | POA: Diagnosis not present

## 2018-09-12 DIAGNOSIS — Z79891 Long term (current) use of opiate analgesic: Secondary | ICD-10-CM

## 2018-09-12 DIAGNOSIS — E119 Type 2 diabetes mellitus without complications: Secondary | ICD-10-CM | POA: Diagnosis not present

## 2018-09-12 DIAGNOSIS — G894 Chronic pain syndrome: Secondary | ICD-10-CM | POA: Diagnosis present

## 2018-09-12 DIAGNOSIS — E871 Hypo-osmolality and hyponatremia: Secondary | ICD-10-CM | POA: Diagnosis not present

## 2018-09-12 DIAGNOSIS — Z792 Long term (current) use of antibiotics: Secondary | ICD-10-CM

## 2018-09-12 DIAGNOSIS — E669 Obesity, unspecified: Secondary | ICD-10-CM | POA: Diagnosis not present

## 2018-09-12 DIAGNOSIS — E1142 Type 2 diabetes mellitus with diabetic polyneuropathy: Secondary | ICD-10-CM | POA: Diagnosis present

## 2018-09-12 DIAGNOSIS — Z818 Family history of other mental and behavioral disorders: Secondary | ICD-10-CM | POA: Diagnosis not present

## 2018-09-12 DIAGNOSIS — F410 Panic disorder [episodic paroxysmal anxiety] without agoraphobia: Secondary | ICD-10-CM | POA: Diagnosis not present

## 2018-09-12 DIAGNOSIS — G8918 Other acute postprocedural pain: Secondary | ICD-10-CM

## 2018-09-12 DIAGNOSIS — E43 Unspecified severe protein-calorie malnutrition: Secondary | ICD-10-CM | POA: Diagnosis not present

## 2018-09-12 DIAGNOSIS — L03119 Cellulitis of unspecified part of limb: Secondary | ICD-10-CM | POA: Diagnosis not present

## 2018-09-12 DIAGNOSIS — H5462 Unqualified visual loss, left eye, normal vision right eye: Secondary | ICD-10-CM | POA: Diagnosis not present

## 2018-09-12 DIAGNOSIS — F4001 Agoraphobia with panic disorder: Secondary | ICD-10-CM | POA: Diagnosis not present

## 2018-09-12 DIAGNOSIS — Z794 Long term (current) use of insulin: Secondary | ICD-10-CM

## 2018-09-12 DIAGNOSIS — L84 Corns and callosities: Secondary | ICD-10-CM | POA: Diagnosis present

## 2018-09-12 DIAGNOSIS — Z8673 Personal history of transient ischemic attack (TIA), and cerebral infarction without residual deficits: Secondary | ICD-10-CM | POA: Diagnosis not present

## 2018-09-12 DIAGNOSIS — Z79899 Other long term (current) drug therapy: Secondary | ICD-10-CM

## 2018-09-12 DIAGNOSIS — L02619 Cutaneous abscess of unspecified foot: Secondary | ICD-10-CM | POA: Diagnosis not present

## 2018-09-12 DIAGNOSIS — Z4781 Encounter for orthopedic aftercare following surgical amputation: Secondary | ICD-10-CM | POA: Diagnosis not present

## 2018-09-12 DIAGNOSIS — M79609 Pain in unspecified limb: Secondary | ICD-10-CM | POA: Diagnosis not present

## 2018-09-12 DIAGNOSIS — D62 Acute posthemorrhagic anemia: Secondary | ICD-10-CM | POA: Diagnosis not present

## 2018-09-12 DIAGNOSIS — M14671 Charcot's joint, right ankle and foot: Secondary | ICD-10-CM | POA: Diagnosis not present

## 2018-09-12 DIAGNOSIS — E1165 Type 2 diabetes mellitus with hyperglycemia: Secondary | ICD-10-CM

## 2018-09-12 DIAGNOSIS — Z888 Allergy status to other drugs, medicaments and biological substances status: Secondary | ICD-10-CM

## 2018-09-12 DIAGNOSIS — Z9181 History of falling: Secondary | ICD-10-CM

## 2018-09-12 DIAGNOSIS — M79672 Pain in left foot: Secondary | ICD-10-CM | POA: Diagnosis present

## 2018-09-12 DIAGNOSIS — F411 Generalized anxiety disorder: Secondary | ICD-10-CM | POA: Diagnosis not present

## 2018-09-12 DIAGNOSIS — M009 Pyogenic arthritis, unspecified: Secondary | ICD-10-CM

## 2018-09-12 DIAGNOSIS — S88111D Complete traumatic amputation at level between knee and ankle, right lower leg, subsequent encounter: Secondary | ICD-10-CM | POA: Diagnosis not present

## 2018-09-12 DIAGNOSIS — S88111S Complete traumatic amputation at level between knee and ankle, right lower leg, sequela: Secondary | ICD-10-CM | POA: Diagnosis not present

## 2018-09-12 DIAGNOSIS — Z886 Allergy status to analgesic agent status: Secondary | ICD-10-CM

## 2018-09-12 DIAGNOSIS — I10 Essential (primary) hypertension: Secondary | ICD-10-CM | POA: Diagnosis present

## 2018-09-12 DIAGNOSIS — D72829 Elevated white blood cell count, unspecified: Secondary | ICD-10-CM | POA: Diagnosis not present

## 2018-09-12 DIAGNOSIS — IMO0002 Reserved for concepts with insufficient information to code with codable children: Secondary | ICD-10-CM

## 2018-09-12 DIAGNOSIS — K5903 Drug induced constipation: Secondary | ICD-10-CM | POA: Diagnosis not present

## 2018-09-12 HISTORY — DX: Cutaneous abscess of left foot: L02.612

## 2018-09-12 LAB — CBC
HCT: 35.4 % — ABNORMAL LOW (ref 39.0–52.0)
Hemoglobin: 11.2 g/dL — ABNORMAL LOW (ref 13.0–17.0)
MCH: 25.7 pg — ABNORMAL LOW (ref 26.0–34.0)
MCHC: 31.6 g/dL (ref 30.0–36.0)
MCV: 81.4 fL (ref 80.0–100.0)
Platelets: 449 10*3/uL — ABNORMAL HIGH (ref 150–400)
RBC: 4.35 MIL/uL (ref 4.22–5.81)
RDW: 15.9 % — ABNORMAL HIGH (ref 11.5–15.5)
WBC: 10.8 10*3/uL — ABNORMAL HIGH (ref 4.0–10.5)
nRBC: 0 % (ref 0.0–0.2)

## 2018-09-12 LAB — CREATININE, SERUM
Creatinine, Ser: 0.99 mg/dL (ref 0.61–1.24)
GFR calc non Af Amer: >60 mL/min (ref >60)

## 2018-09-12 LAB — GLUCOSE, CAPILLARY
Glucose-Capillary: 387 mg/dL — ABNORMAL HIGH (ref 70–99)
Glucose-Capillary: 292 mg/dL — ABNORMAL HIGH (ref 70–99)

## 2018-09-12 MED ORDER — HYDROMORPHONE HCL 2 MG PO TABS
8.0000 mg | ORAL_TABLET | ORAL | Status: DC | PRN
  Administered 2018-09-15: 8 mg via ORAL
  Filled 2018-09-12: qty 4

## 2018-09-12 MED ORDER — INSULIN ASPART 100 UNIT/ML ~~LOC~~ SOLN
30.0000 [IU] | Freq: Three times a day (TID) | SUBCUTANEOUS | Status: DC
  Administered 2018-09-12 – 2018-09-13 (×3): 30 [IU] via SUBCUTANEOUS

## 2018-09-12 MED ORDER — ONDANSETRON HCL 4 MG/2ML IJ SOLN
4.0000 mg | Freq: Four times a day (QID) | INTRAMUSCULAR | Status: DC | PRN
  Administered 2018-09-14: 4 mg via INTRAVENOUS

## 2018-09-12 MED ORDER — ASPIRIN EC 81 MG PO TBEC
81.0000 mg | DELAYED_RELEASE_TABLET | Freq: Every day | ORAL | Status: DC
  Administered 2018-09-13 – 2018-09-17 (×5): 81 mg via ORAL
  Filled 2018-09-12 (×5): qty 1

## 2018-09-12 MED ORDER — MORPHINE SULFATE (PF) 2 MG/ML IV SOLN
2.0000 mg | INTRAVENOUS | Status: DC | PRN
  Administered 2018-09-12 – 2018-09-15 (×6): 2 mg via INTRAVENOUS
  Filled 2018-09-12 (×6): qty 1

## 2018-09-12 MED ORDER — ACETAMINOPHEN 325 MG PO TABS: 650.0000 mg | ORAL_TABLET | Freq: Four times a day (QID) | ORAL | Status: DC | PRN

## 2018-09-12 MED ORDER — INSULIN ASPART 100 UNIT/ML ~~LOC~~ SOLN
0.0000 [IU] | Freq: Three times a day (TID) | SUBCUTANEOUS | Status: DC
  Administered 2018-09-12: 15 [IU] via SUBCUTANEOUS
  Administered 2018-09-13: 2 [IU] via SUBCUTANEOUS
  Administered 2018-09-13 (×2): 3 [IU] via SUBCUTANEOUS
  Administered 2018-09-14: 11 [IU] via SUBCUTANEOUS
  Administered 2018-09-14: 15 [IU] via SUBCUTANEOUS
  Administered 2018-09-14: 3 [IU] via SUBCUTANEOUS
  Administered 2018-09-15: 5 [IU] via SUBCUTANEOUS
  Administered 2018-09-15: 3 [IU] via SUBCUTANEOUS
  Administered 2018-09-15: 8 [IU] via SUBCUTANEOUS
  Administered 2018-09-16 (×2): 5 [IU] via SUBCUTANEOUS
  Administered 2018-09-16: 8 [IU] via SUBCUTANEOUS
  Administered 2018-09-17: 5 [IU] via SUBCUTANEOUS
  Administered 2018-09-17: 11 [IU] via SUBCUTANEOUS

## 2018-09-12 MED ORDER — COLCHICINE 0.6 MG PO TABS: 0.6000 mg | ORAL_TABLET | Freq: Every day | ORAL | Status: DC

## 2018-09-12 MED ORDER — HYDROXYZINE HCL 10 MG PO TABS
10.0000 mg | ORAL_TABLET | Freq: Three times a day (TID) | ORAL | Status: DC | PRN
  Administered 2018-09-13: 10 mg via ORAL
  Filled 2018-09-12 (×3): qty 1

## 2018-09-12 MED ORDER — PIPERACILLIN-TAZOBACTAM 3.375 G IVPB
3.3750 g | Freq: Three times a day (TID) | INTRAVENOUS | Status: DC
  Administered 2018-09-12 – 2018-09-15 (×8): 3.375 g via INTRAVENOUS
  Filled 2018-09-12 (×6): qty 50

## 2018-09-12 MED ORDER — VANCOMYCIN HCL 10 G IV SOLR
2500.0000 mg | INTRAVENOUS | Status: AC
  Administered 2018-09-12: 2500 mg via INTRAVENOUS
  Filled 2018-09-12: qty 2500

## 2018-09-12 MED ORDER — INSULIN ASPART 100 UNIT/ML ~~LOC~~ SOLN
0.0000 [IU] | Freq: Every day | SUBCUTANEOUS | Status: DC
  Administered 2018-09-12: 3 [IU] via SUBCUTANEOUS
  Administered 2018-09-13 – 2018-09-15 (×2): 2 [IU] via SUBCUTANEOUS
  Administered 2018-09-16: 3 [IU] via SUBCUTANEOUS

## 2018-09-12 MED ORDER — ACETAMINOPHEN 650 MG RE SUPP: 650.0000 mg | Freq: Four times a day (QID) | RECTAL | Status: DC | PRN

## 2018-09-12 MED ORDER — ENOXAPARIN SODIUM 40 MG/0.4ML ~~LOC~~ SOLN: 40.0000 mg | SUBCUTANEOUS | Status: DC

## 2018-09-12 MED ORDER — FUROSEMIDE 20 MG PO TABS
20.0000 mg | ORAL_TABLET | Freq: Every day | ORAL | Status: DC
  Administered 2018-09-13 – 2018-09-16 (×4): 20 mg via ORAL
  Filled 2018-09-12 (×4): qty 1

## 2018-09-12 MED ORDER — SACCHAROMYCES BOULARDII 250 MG PO CAPS
250.0000 mg | ORAL_CAPSULE | Freq: Two times a day (BID) | ORAL | Status: DC
  Administered 2018-09-12 – 2018-09-17 (×10): 250 mg via ORAL
  Filled 2018-09-12 (×10): qty 1

## 2018-09-12 MED ORDER — VANCOMYCIN HCL 10 G IV SOLR
2000.0000 mg | Freq: Two times a day (BID) | INTRAVENOUS | Status: DC
  Administered 2018-09-13 – 2018-09-15 (×5): 2000 mg via INTRAVENOUS
  Filled 2018-09-12 (×5): qty 2000

## 2018-09-12 MED ORDER — GABAPENTIN 300 MG PO CAPS
300.0000 mg | ORAL_CAPSULE | Freq: Three times a day (TID) | ORAL | Status: DC
  Administered 2018-09-12 – 2018-09-17 (×14): 300 mg via ORAL
  Filled 2018-09-12 (×14): qty 1

## 2018-09-12 MED ORDER — ONDANSETRON HCL 4 MG PO TABS: 4.0000 mg | ORAL_TABLET | Freq: Four times a day (QID) | ORAL | Status: DC | PRN

## 2018-09-12 MED ORDER — PANTOPRAZOLE SODIUM 40 MG PO TBEC
40.0000 mg | DELAYED_RELEASE_TABLET | Freq: Every day | ORAL | Status: DC
  Administered 2018-09-12 – 2018-09-16 (×5): 40 mg via ORAL
  Filled 2018-09-12 (×5): qty 1

## 2018-09-12 MED ORDER — METOPROLOL TARTRATE 50 MG PO TABS
50.0000 mg | ORAL_TABLET | Freq: Two times a day (BID) | ORAL | Status: DC
  Administered 2018-09-12 – 2018-09-17 (×10): 50 mg via ORAL
  Filled 2018-09-12 (×10): qty 1

## 2018-09-12 MED ORDER — INSULIN DETEMIR 100 UNIT/ML ~~LOC~~ SOLN
60.0000 [IU] | Freq: Two times a day (BID) | SUBCUTANEOUS | Status: DC
  Administered 2018-09-12 – 2018-09-13 (×2): 60 [IU] via SUBCUTANEOUS
  Filled 2018-09-12 (×2): qty 0.6

## 2018-09-12 MED ORDER — ENOXAPARIN SODIUM 100 MG/ML ~~LOC~~ SOLN
0.5000 mg/kg | SUBCUTANEOUS | Status: DC
  Administered 2018-09-12 – 2018-09-16 (×5): 90 mg via SUBCUTANEOUS
  Filled 2018-09-12 (×5): qty 1

## 2018-09-12 MED ORDER — LORAZEPAM 1 MG PO TABS
1.0000 mg | ORAL_TABLET | Freq: Once | ORAL | Status: AC
  Administered 2018-09-12: 1 mg via ORAL
  Filled 2018-09-12: qty 1

## 2018-09-12 MED ORDER — CLOPIDOGREL BISULFATE 75 MG PO TABS
75.0000 mg | ORAL_TABLET | Freq: Every day | ORAL | Status: DC
  Administered 2018-09-12 – 2018-09-17 (×6): 75 mg via ORAL
  Filled 2018-09-12 (×6): qty 1

## 2018-09-12 NOTE — Progress Notes (Signed)
Pt admitted to 5W room 7 via [facility] from [facility]. A&O. VS stable. Family at bedside. All questions/concerns addressed. [facility] paged for orders. Will continue to monitor and treat per orders.

## 2018-09-12 NOTE — Progress Notes (Signed)
Report received from [facility].

## 2018-09-12 NOTE — Progress Notes (Signed)
Pharmacy Antibiotic Note  Kristopher Simon is a 53 y.o. male admitted on 09/12/2018 with left foot and ankle pain and swelling; followed by podiatry.  Pharmacy has been consulted for vancomycin and Zosyn dosing with concern for septic arthritis, abscess, and chronic osteomyelitis. Ortho consulted.  Ceftriaxone 1 g IV 1/21 at 23:19, Zosyn 4.5 g IV given at 13:02. Daptomycin was ordered but not given - has a hx of AKI with previous vancomycin.  [facility] labs: SCr 1, WBC 12.6, Hgb 12.3, Hct 38, pltc 546  Vancomycin 2000 mg IV q12h. Goal AUC 400-550. Expected AUC: 489.2 SCr used: 1  Plan: Vancomycin 2500 mg IV load then 2000 mg IV q12h Zosyn 3.375 g IV q8h to be infused over 4 hours  Monitor renal function, clinical progress, and C/S   Height: 6\' 5"  (195.6 cm) Weight: (!) 393 lb 4.8 oz (178.4 kg) IBW/kg (Calculated) : 89.1  Temp (24hrs), Avg:98.7 F (37.1 C), Min:98.7 F (37.1 C), Max:98.7 F (37.1 C)  No results for input(s): WBC, CREATININE, LATICACIDVEN, VANCOTROUGH, VANCOPEAK, VANCORANDOM, GENTTROUGH, GENTPEAK, GENTRANDOM, TOBRATROUGH, TOBRAPEAK, TOBRARND, AMIKACINPEAK, AMIKACINTROU, AMIKACIN in the last 168 hours.  CrCl cannot be calculated (Patient's most recent lab result is older than the maximum 21 days allowed.).    Allergies  Allergen Reactions  . Sulfamethoxazole-Trimethoprim Swelling and Anaphylaxis    "tongue swelling" "tongue swelling" Pt states tongue swells  . Ibuprofen Other (See Comments)    Kidney issues Kidney issues Shuts kidneys down Kidney issues Shuts kidneys down  . Pregabalin Swelling    Antimicrobials this admission: vancomycin 1/22 >>  Zosyn 1/22 >>   Dose adjustments this admission:   Microbiology results:   Thank you for allowing pharmacy to be a part of this patient's care.  Renold Genta, PharmD, BCPS Clinical Pharmacist Clinical phone for 09/12/2018 until 10p is [phone] 09/12/2018 4:13 PM  **Pharmacist phone directory can  now be found on [email or web address] listed under [place]**

## 2018-09-12 NOTE — H&P (Signed)
History and Physical    Kristopher Simon ZDG:[id number] DOB: [birth year] DOA: 09/12/2018  Referring MD/NP/PA-EDP From [facility] PCP:  Patient coming from-[facility] emergency room  Chief Complaint: Left foot/ankle pain and swelling  HPI: Kristopher Simon is a 53 year old male with past medical history of obesity, uncontrolled type 2 diabetes mellitus, stroke, CAD, hypertension, dyslipidemia, Charcot foot deformity on the left followed by podiatrist Dr. Cannon Kettle with multiple surgeries, last surgery on the left foot was in June 2018.  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 Cannon Kettle, this was being treated with local wound care, medahoney etc. for the past 4 to 5 days patient had increased pain and swelling in his lower calf, ankle region, around the same time he had a dental issue which he needed to sort out, he denies any subjective fevers or chills, yesterday due to worsening pain and swelling went to [facility] emergency room ED Course: Underwent extensive work-up at [facility] ER including Dopplers which were negative for DVT, subsequently a CT scan of his left lower leg was done this was concerning for interval bone destruction, ostial lysis of the distal tibia, talus, calcaneus, in addition phlegmonous inflammatory soft tissue abnormalities were noted around the ankle and subtalar joints with enhancing fluid collections raising concern for septic arthritis and periarticular soft tissue abscess formation, also distal tibia metaphysis were concerning for chronic osteomyelitis, subsequently case was discussed with orthopedic surgeon at [facility] who referred him to orthopedics at [facility] for further management  Review of Systems: As per HPI otherwise 14 point review of systems negative.   Past Medical History:  Diagnosis Date  . Acute renal failure ([place]) 01/2012  . Anxiety   . Cellulitis   . Concussion   . Coronary artery disease   . Diabetes  mellitus 1995  . Fibromyalgia   . Hypertension 1998    Past Surgical History:  Procedure Laterality Date  . CARDIAC CATHETERIZATION    . FRACTURE SURGERY    . HIP FRACTURE SURGERY       reports that he has never smoked. He has never used smokeless tobacco. He reports current alcohol use of about 1.0 standard drinks of alcohol per week. He reports that he does not use drugs.  Allergies  Allergen Reactions  . Sulfamethoxazole-Trimethoprim Swelling and Anaphylaxis    "tongue swelling" "tongue swelling" Pt states tongue swells  . Ibuprofen Other (See Comments)    Kidney issues Kidney issues Shuts kidneys down Kidney issues Shuts kidneys down  . Pregabalin Swelling    Family History  Problem Relation Age of Onset  . Hypertension Mother   . Dementia Father   . Alzheimer's disease Father      Prior to Admission medications   Medication Sig Start Date End Date Taking? Authorizing Provider  aspirin EC 81 MG tablet Take 81 mg by mouth daily.    [provider]  CINNAMON PO Take 1 tablet by mouth every morning.    [provider]  ciprofloxacin (CIPRO) 500 MG tablet Take 1 tablet (500 mg total) by mouth 2 (two) times daily. 03/26/18   Landis Martins, DPM  clindamycin (CLEOCIN) 300 MG capsule Take 1 capsule (300 mg total) by mouth 3 (three) times daily. 08/25/17   Landis Martins, DPM  clobetasol cream (TEMOVATE) 5.95 % Apply 1 application topically daily. Daily at bedtime 08/04/17   Landis Martins, DPM  clopidogrel (PLAVIX) 75 MG tablet Take 75 mg by mouth daily.  08/22/18   [provider]  colchicine 0.6 MG tablet Take 1 tablet (0.6 mg total) daily by mouth. 06/30/17   Landis Martins, DPM  cyclobenzaprine (FLEXERIL) 10 MG tablet Take 10-20 mg by mouth 2 (two) times daily. 75m in the morning and 23mat bedtime    [provider]  esomeprazole (NEXIUM) 40 MG capsule Take 40 mg by mouth every morning.    [provider]  furosemide (LASIX)  20 MG tablet Take 20 mg by mouth daily. 07/02/18   [provider]  gabapentin (NEURONTIN) 300 MG capsule Take 300 mg by mouth 3 (three) times daily.    [provider]  gentamicin ointment (GARAMYCIN) 0.1 % Apply to wounds with each dressing change 05/30/18   StLandis MartinsDPM  hydrochlorothiazide (HYDRODIURIL) 25 MG tablet Take 25 mg by mouth daily.    [provider]  hydrocortisone cream 0.5 % Apply 1 application 2 (two) times daily topically. To red areas on leg as needed 06/30/17   StLandis MartinsDPM  HYDROmorphone (DILAUDID) 8 MG tablet Take 1 tablet (8 mg total) by mouth every 4 (four) hours as needed for severe pain. 11/16/17   StLandis MartinsDPM  hydrOXYzine (ATARAX/VISTARIL) 10 MG tablet TAKE 1 TABLET BY MOUTH EVERY 12 HOURS AS NEEDED FOR ANXIETY/PAIN 07/02/18   [provider]  insulin aspart (NOVOLOG FLEXPEN) 100 UNIT/ML FlexPen Inject 30-45 Units into the skin 3 (three) times daily with meals.    [provider]  insulin detemir (LEVEMIR) 100 UNIT/ML injection Inject 0.6 mLs (60 Units total) into the skin 2 (two) times daily. 07/11/14   AbReyne DumasMD  levofloxacin (LEVAQUIN) 500 MG tablet Take 1 tablet (500 mg total) by mouth daily. 05/23/18   StLandis MartinsDPM  methylPREDNISolone (MEDROL DOSEPAK) 4 MG TBPK tablet Take as instructed 09/08/17   StLandis MartinsDPM  metoCLOPramide (REGLAN) 5 MG tablet Take 1 tablet (5 mg total) by mouth 4 (four) times daily -  before meals and at bedtime. Patient not taking: Reported on 07/06/2014 02/09/12 02/19/12  KrBonnielee HaffMD  metoprolol (LOPRESSOR) 100 MG tablet Take 100 mg by mouth 2 (two) times daily.    [provider]  metoprolol (LOPRESSOR) 50 MG tablet Take 50 mg by mouth 2 (two) times daily.    [provider]  mupirocin ointment (BACTROBAN) 2 % To left shin 06/01/17   Stover, Titorya, DPM  NUCYNTA 100 MG TABS TAKE 1 TABLET BY MOUTH EVERY 6 HOURS AS NEEDED FOR CHRONIC  PAIN **MAX 4/DAY** 06/28/18   [provider]  nystatin cream (MYCOSTATIN) Apply 1 application topically 3 (three) times daily.    [provider]  Probiotic Product (TRUBIOTICS PO) Take 1 tablet by mouth every morning.    [provider]  Pyridoxine HCl (B-6 PO) Take 1 tablet by mouth every morning.    [provider]  saccharomyces boulardii (FLORASTOR) 250 MG capsule Take 1 capsule (250 mg total) by mouth 2 (two) times daily. 07/11/14   AbReyne DumasMD  VICTOZA 18 MG/3ML SOPN INJECT 0.3 MLS (1.8MG TOTAL) INTO THE SKIN DAILY 06/26/18   [provider]    Physical Exam: Vitals:   09/12/18 1512  BP: (!) 160/84  Pulse: (!) 109  Resp: 16  Temp: 98.7 F (37.1 C)  TempSrc: Oral  SpO2: 99%  Weight: (!) 178.4 kg  Height: 6' 5" (1.956 m)    Gen: Obese pleasant chronically ill-appearing male, sitting up in bed, no distress  HEENT: PERRLA, Neck supple, no JVD Lungs: Distant breath sounds, clear anteriorly CVS: S1-S2/regular rate rhythm Abd: Abdomen is soft obese, nontender, bowel sounds present Extremities: Right lower extremity unremarkable except for chronic venous stasis changes, left lower leg with mild erythema swelling and tenderness overlying the ankle region, Charcot deformities with multiple swollen areas on the lateral and medial aspect, small skin tear noted, prior surgical scars noted Skin: As above Psychiatric: Pleasant, appropriate mood and affect  Labs on Admission: I have personally reviewed following labs and imaging studies  CBC: No results for input(s): WBC, NEUTROABS, HGB, HCT, MCV, PLT in the last 168 hours. Basic Metabolic Panel: No results for input(s): NA, K, CL, CO2, GLUCOSE, BUN, CREATININE, CALCIUM, MG, PHOS in the last 168 hours. GFR: CrCl cannot be calculated (Patient's most recent lab result is older than the maximum 21 days allowed.). Liver Function Tests: No results for input(s): AST, ALT, ALKPHOS, BILITOT,  PROT, ALBUMIN in the last 168 hours. No results for input(s): LIPASE, AMYLASE in the last 168 hours. No results for input(s): AMMONIA in the last 168 hours. Coagulation Profile: No results for input(s): INR, PROTIME in the last 168 hours. Cardiac Enzymes: No results for input(s): CKTOTAL, CKMB, CKMBINDEX, TROPONINI in the last 168 hours. BNP (last 3 results) No results for input(s): PROBNP in the last 8760 hours. HbA1C: No results for input(s): HGBA1C in the last 72 hours. CBG: No results for input(s): GLUCAP in the last 168 hours. Lipid Profile: No results for input(s): CHOL, HDL, LDLCALC, TRIG, CHOLHDL, LDLDIRECT in the last 72 hours. Thyroid Function Tests: No results for input(s): TSH, T4TOTAL, FREET4, T3FREE, THYROIDAB in the last 72 hours. Anemia Panel: No results for input(s): VITAMINB12, FOLATE, FERRITIN, TIBC, IRON, RETICCTPCT in the last 72 hours. Urine analysis: No results found for: COLORURINE, APPEARANCEUR, LABSPEC, PHURINE, GLUCOSEU, HGBUR, BILIRUBINUR, KETONESUR, PROTEINUR, UROBILINOGEN, NITRITE, LEUKOCYTESUR Sepsis Labs: _0 (procalcitonin:4,lacticidven:4) )No results found for this or any previous visit (from the past 240 hour(s)).   Radiological Exams on Admission: No results found.  EKG: Pending  Assessment/Plan  Septic arthritis/left foot abscess  -In the background of chronic osteomyelitis, Charcot deformity of left foot  -Start IV vancomycin -for gram-positive coverage and add Zosyn since he is a diabetic for gram-negative and anaerobic coverage -CT as noted above with concerns for septic arthritis, fluid collection/abscess and chronic osteomyelitis -Orthopedics consult, left message for Dr. Sharol Given -Check ESR  Uncontrolled diabetic -Resume home regimen of Levemir 60 units twice daily in addition also takes 35 to 40 units of short acting insulin 3 times a day, add meal coverage and sliding scale -Check hemoglobin A1c  History of CAD/stent -Stable,  continue Plavix, beta-blocker  History of CVA -Continue Plavix and statin  Chronic pain syndrome/narcotic dependence -On an extensive regimen of oral Dilaudid, Nucynta (not on formulary), gabapentin -Resume Dilaudid, gabapentin, add IV morphine for severe pain  Morbid obesity -BMI is 46, needs aggressive lifestyle modification  DVT prophylaxis: Lovenox Code Status: Full code Family Communication: Sister and mother at bedside Disposition Plan: Likely home pending orthopedic surgery Consults called: Left message for orthopedics Dr. Sharol Given  admission status: Inpatient  Domenic Polite MD [facility]  09/12/2018, 4:13 PM

## 2018-09-13 ENCOUNTER — Other Ambulatory Visit (INDEPENDENT_AMBULATORY_CARE_PROVIDER_SITE_OTHER): Payer: Self-pay | Admitting: Orthopedic Surgery

## 2018-09-13 ENCOUNTER — Inpatient Hospital Stay (HOSPITAL_COMMUNITY): Payer: Medicaid Other

## 2018-09-13 DIAGNOSIS — E43 Unspecified severe protein-calorie malnutrition: Secondary | ICD-10-CM

## 2018-09-13 DIAGNOSIS — I2510 Atherosclerotic heart disease of native coronary artery without angina pectoris: Secondary | ICD-10-CM

## 2018-09-13 DIAGNOSIS — E11610 Type 2 diabetes mellitus with diabetic neuropathic arthropathy: Secondary | ICD-10-CM

## 2018-09-13 DIAGNOSIS — E1165 Type 2 diabetes mellitus with hyperglycemia: Secondary | ICD-10-CM

## 2018-09-13 DIAGNOSIS — E1142 Type 2 diabetes mellitus with diabetic polyneuropathy: Secondary | ICD-10-CM

## 2018-09-13 LAB — CBC
HCT: 34.9 % — ABNORMAL LOW (ref 39.0–52.0)
Hemoglobin: 11.1 g/dL — ABNORMAL LOW (ref 13.0–17.0)
MCH: 26.2 pg (ref 26.0–34.0)
MCHC: 31.8 g/dL (ref 30.0–36.0)
MCV: 82.5 fL (ref 80.0–100.0)
PLATELETS: 435 10*3/uL — AB (ref 150–400)
RBC: 4.23 MIL/uL (ref 4.22–5.81)
RDW: 16.1 % — AB (ref 11.5–15.5)
WBC: 10.5 10*3/uL (ref 4.0–10.5)
nRBC: 0 % (ref 0.0–0.2)

## 2018-09-13 LAB — TYPE AND SCREEN
ABO/RH(D): A NEG
Antibody Screen: NEGATIVE

## 2018-09-13 LAB — SURGICAL PCR SCREEN
MRSA, PCR: NEGATIVE
Staphylococcus aureus: NEGATIVE

## 2018-09-13 LAB — ECHOCARDIOGRAM COMPLETE
Height: 77 in
WEIGHTICAEL: 6292.81 [oz_av]

## 2018-09-13 LAB — ABO/RH: ABO/RH(D): A NEG

## 2018-09-13 LAB — BASIC METABOLIC PANEL
Anion gap: 10 (ref 5–15)
BUN: 9 mg/dL (ref 6–20)
CO2: 25 mmol/L (ref 22–32)
Calcium: 8.7 mg/dL — ABNORMAL LOW (ref 8.9–10.3)
Chloride: 100 mmol/L (ref 98–111)
Creatinine, Ser: 0.91 mg/dL (ref 0.61–1.24)
GFR calc Af Amer: >60 mL/min (ref >60)
Glucose, Bld: 168 mg/dL — ABNORMAL HIGH (ref 70–99)
Potassium: 3.3 mmol/L — ABNORMAL LOW (ref 3.5–5.1)
Sodium: 135 mmol/L (ref 135–145)

## 2018-09-13 LAB — HEMOGLOBIN A1C
Hgb A1c MFr Bld: 10.1 % — ABNORMAL HIGH (ref 4.8–5.6)
Mean Plasma Glucose: 243.17 mg/dL

## 2018-09-13 LAB — GLUCOSE, CAPILLARY
Glucose-Capillary: 195 mg/dL — ABNORMAL HIGH (ref 70–99)
Glucose-Capillary: 177 mg/dL — ABNORMAL HIGH (ref 70–99)
Glucose-Capillary: 143 mg/dL — ABNORMAL HIGH (ref 70–99)
Glucose-Capillary: 249 mg/dL — ABNORMAL HIGH (ref 70–99)

## 2018-09-13 LAB — PREALBUMIN: Prealbumin: 11.4 mg/dL — ABNORMAL LOW (ref 18–38)

## 2018-09-13 LAB — HIV ANTIBODY (ROUTINE TESTING W REFLEX): HIV Screen 4th Generation wRfx: NONREACTIVE

## 2018-09-13 LAB — ALBUMIN: Albumin: 3 g/dL — ABNORMAL LOW (ref 3.5–5.0)

## 2018-09-13 LAB — SEDIMENTATION RATE: Sed Rate: 61 mm/hr — ABNORMAL HIGH (ref 0–16)

## 2018-09-13 MED ORDER — INSULIN ASPART 100 UNIT/ML ~~LOC~~ SOLN
20.0000 [IU] | Freq: Three times a day (TID) | SUBCUTANEOUS | Status: DC
  Administered 2018-09-13 – 2018-09-17 (×11): 20 [IU] via SUBCUTANEOUS

## 2018-09-13 MED ORDER — DEXTROSE 5 % IV SOLN
3.0000 g | INTRAVENOUS | Status: AC
  Administered 2018-09-14: 3 g via INTRAVENOUS
  Filled 2018-09-13: qty 3

## 2018-09-13 MED ORDER — CHLORHEXIDINE GLUCONATE 4 % EX LIQD
60.0000 mL | Freq: Once | CUTANEOUS | Status: AC
  Administered 2018-09-13: 4 via TOPICAL
  Filled 2018-09-13: qty 60

## 2018-09-13 MED ORDER — INSULIN DETEMIR 100 UNIT/ML ~~LOC~~ SOLN
30.0000 [IU] | Freq: Every day | SUBCUTANEOUS | Status: AC
  Administered 2018-09-13: 30 [IU] via SUBCUTANEOUS
  Filled 2018-09-13: qty 0.3

## 2018-09-13 MED ORDER — DEXTROSE 5 % IV SOLN
INTRAVENOUS | Status: AC
  Administered 2018-09-13: via INTRAVENOUS

## 2018-09-13 MED ORDER — POTASSIUM CHLORIDE CRYS ER 20 MEQ PO TBCR
40.0000 meq | EXTENDED_RELEASE_TABLET | Freq: Once | ORAL | Status: AC
  Administered 2018-09-13: 40 meq via ORAL
  Filled 2018-09-13: qty 2

## 2018-09-13 MED ORDER — INSULIN DETEMIR 100 UNIT/ML ~~LOC~~ SOLN
60.0000 [IU] | Freq: Two times a day (BID) | SUBCUTANEOUS | Status: DC
  Filled 2018-09-13: qty 0.6

## 2018-09-13 MED ORDER — ACETAMINOPHEN 325 MG PO TABS: 650.0000 mg | ORAL_TABLET | Freq: Four times a day (QID) | ORAL | Status: DC | PRN

## 2018-09-13 NOTE — H&P (View-Only) (Signed)
ORTHOPAEDIC CONSULTATION  REQUESTING PHYSICIAN: Domenic Polite, MD;Sing*  Chief Complaint: Increased pain and swelling left ankle with Charcot collapse.  HPI: Kristopher Simon is a 53 y.o. 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  Patient states he recently started using a fracture boot and he feels like the fracture boot has caused increased pressure around the ankle.  Patient states his symptoms are better now that he is not wearing the fracture boot.  Past Medical History:  Diagnosis Date  . Acute renal failure ([place]) 01/2012  . Anxiety   . Cellulitis   . Concussion   . Coronary artery disease   . Diabetes mellitus 1995  . Fibromyalgia   . Hypertension 1998   Past Surgical History:  Procedure Laterality Date  . CARDIAC CATHETERIZATION    . FRACTURE SURGERY    . HIP FRACTURE SURGERY     Social History   Socioeconomic History  . Marital status: Divorced    Spouse name: Not on file  . Number of children: Not on file  . Years of education: Not on file  . Highest education level: Not on file  Occupational History  . Not on file  Social Needs  . Financial resource strain: Not on file  . Food insecurity:    Worry: Not on file    Inability: Not on file  . Transportation needs:    Medical: Not on file    Non-medical: Not on file  Tobacco Use  . Smoking status: Never Smoker  . Smokeless tobacco: Never Used  Substance and Sexual Activity  . Alcohol use: Yes    Alcohol/week: 1.0 standard drinks    Types: 1 Glasses of wine per week    Comment: rarely  . Drug use: No  . Sexual activity: Yes    Birth control/protection: None  Lifestyle  . Physical activity:    Days per week: Not on file    Minutes per session: Not on file  . Stress: Not on file  Relationships  . Social connections:    Talks on phone: Not on file    Gets together: Not on file    Attends religious  service: Not on file    Active member of club or organization: Not on file    Attends meetings of clubs or organizations: Not on file    Relationship status: Not on file  Other Topics Concern  . Not on file  Social History Narrative  . Not on file   Family History  Problem Relation Age of Onset  . Hypertension Mother   . Dementia Father   . Alzheimer's disease Father    - negative except otherwise stated in the family history section Allergies  Allergen Reactions  . Magnesium-Containing Compounds Anaphylaxis  . Sulfamethoxazole-Trimethoprim Swelling and Anaphylaxis    "tongue swelling" "tongue swelling" Pt states tongue swells  . Ibuprofen Other (See Comments)    Kidney issues Kidney issues Shuts kidneys down Kidney issues Shuts kidneys down  . Pregabalin Swelling   Prior to Admission medications   Medication Sig Start Date End Date Taking? Authorizing Provider  amLODipine (NORVASC) 10 MG tablet Take 10 mg by mouth daily.   Yes [provider]  aspirin 325 MG tablet Take 325 mg by mouth daily.   Yes [provider]  citalopram (CELEXA) 20 MG tablet Take 20 mg by mouth daily.   Yes [provider]  clopidogrel (PLAVIX)  75 MG tablet Take 75 mg by mouth daily.   Yes [provider]  cyclobenzaprine (FLEXERIL) 10 MG tablet Take 10 mg by mouth daily.    Yes [provider]  furosemide (LASIX) 20 MG tablet Take 20 mg by mouth daily. 07/02/18  Yes [provider]  gabapentin (NEURONTIN) 300 MG capsule Take 600 mg by mouth 2 (two) times daily.    Yes [provider]  gemfibrozil (LOPID) 600 MG tablet Take 1,200 mg by mouth 2 (two) times daily before a meal.   Yes [provider]  gentamicin ointment (GARAMYCIN) 0.1 % Apply to wounds with each dressing change 05/30/18  Yes Stover, Titorya, DPM  hydrOXYzine (ATARAX/VISTARIL) 10 MG tablet Take 10 mg by mouth daily.  07/02/18  Yes [provider]  insulin  aspart (NOVOLOG FLEXPEN) 100 UNIT/ML FlexPen Inject 60 Units into the skin See admin instructions. 3-4 times daily   Yes [provider]  Insulin Degludec (TRESIBA FLEXTOUCH) 200 UNIT/ML SOPN Inject 154 Units into the skin daily.   Yes [provider]  isosorbide mononitrate (IMDUR) 60 MG 24 hr tablet Take 60 mg by mouth daily.   Yes [provider]  metoprolol (LOPRESSOR) 100 MG tablet Take 100 mg by mouth 2 (two) times daily.   Yes [provider]  NUCYNTA 100 MG TABS Take 100 mg by mouth every 6 (six) hours as needed (chronic pain).  06/28/18  Yes [provider]  omeprazole (PRILOSEC) 20 MG capsule Take 20 mg by mouth daily.   Yes [provider]  pravastatin (PRAVACHOL) 20 MG tablet Take 20 mg by mouth daily.   Yes [provider]  VICTOZA 18 MG/3ML SOPN Inject 1.8 mg into the skin daily.  06/26/18  Yes [provider]   No results found. - pertinent xrays, CT, MRI studies were reviewed and independently interpreted  Positive ROS: All other systems have been reviewed and were otherwise negative with the exception of those mentioned in the HPI and as above.  Physical Exam: General: Alert, no acute distress Psychiatric: Patient is competent for consent with normal mood and affect Lymphatic: No axillary or cervical lymphadenopathy Cardiovascular: No pedal edema Respiratory: No cyanosis, no use of accessory musculature GI: No organomegaly, abdomen is soft and non-tender    Images:  @ENCIMAGES @  Labs:  Lab Results  Component Value Date   HGBA1C 10.1 (H) 09/13/2018   HGBA1C 12.3 (H) 07/09/2014   HGBA1C 7.8 (H) 02/05/2012    Lab Results  Component Value Date   ALBUMIN 2.6 (L) 07/10/2014   ALBUMIN 2.7 (L) 07/07/2014   ALBUMIN 3.5 07/05/2014    Neurologic: Patient does not have protective sensation bilateral lower extremities.   MUSCULOSKELETAL:   Skin: Examination patient has significant swelling in  the left lower extremity compared to the right lower extremity.  He has brawny skin color changes with venous insufficiency pitting edema.  There is a good dorsalis pedis pulse and posterior tibial pulse bilaterally.  No swelling or ulcers in the right lower extremity.  The lateral malleolar ulcer has healed.  Patient has no tenderness to palpation no fluctuance no open wounds.  Review of the CT scan shows massive destruction of the talus and calcaneus with destruction and remodeling through the midfoot.  There are some local fluid collections on the CT scan however these do appear to be consistent with the Charcot arthropathy.  Patient has no open wounds around the foot or ankle no drainage no tenderness to  palpation.  There is instability of the ankle with varus collapse of the ankle and prominence of the distal fibula.  Patient's hindfoot is in approximately 45 degrees of varus.  Hemoglobin A1c uncontrolled at 10.1, January 2020, with most recent albumin at 2.6, November 2015.  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  Clinically patient's pain and swelling seem to come from impingement from his cam walking boot secondary to the unstable Charcot ankle with BMI greater than 46.  Patient has insufficient bone and insufficient stability to weight-bear on the left lower extremity due to the advanced Charcot collapse.  Plan: Patient has insufficient bone in the calcaneus and  talus to provide tibial calcaneal stabilization for a stable weightbearing foot for his BMI greater than 46.  Patient's best option would require a transtibial amputation for stable weightbearing.  I discussed with patient that we could proceed with a transtibial amputation during this hospitalization with discharge to skilled nursing or inpatient rehabilitation.  Discussed that  we could also proceed with a transtibial amputation electively.  Patient states he would prefer to proceed with surgery at this time.  Will plan for left transtibial amputation tomorrow.  Thank you for the consult and the opportunity to see Kristopher Simon, [place] [phone] 6:47 AM

## 2018-09-13 NOTE — Consult Note (Addendum)
ORTHOPAEDIC CONSULTATION  REQUESTING PHYSICIAN: Domenic Polite, MD;Sing*  Chief Complaint: Increased pain and swelling left ankle with Charcot collapse.  HPI: Kristopher Simon is a 53 y.o. 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  Patient states he recently started using a fracture boot and he feels like the fracture boot has caused increased pressure around the ankle.  Patient states his symptoms are better now that he is not wearing the fracture boot.  Past Medical History:  Diagnosis Date  . Acute renal failure ([place]) 01/2012  . Anxiety   . Cellulitis   . Concussion   . Coronary artery disease   . Diabetes mellitus 1995  . Fibromyalgia   . Hypertension 1998   Past Surgical History:  Procedure Laterality Date  . CARDIAC CATHETERIZATION    . FRACTURE SURGERY    . HIP FRACTURE SURGERY     Social History   Socioeconomic History  . Marital status: Divorced    Spouse name: Not on file  . Number of children: Not on file  . Years of education: Not on file  . Highest education level: Not on file  Occupational History  . Not on file  Social Needs  . Financial resource strain: Not on file  . Food insecurity:    Worry: Not on file    Inability: Not on file  . Transportation needs:    Medical: Not on file    Non-medical: Not on file  Tobacco Use  . Smoking status: Never Smoker  . Smokeless tobacco: Never Used  Substance and Sexual Activity  . Alcohol use: Yes    Alcohol/week: 1.0 standard drinks    Types: 1 Glasses of wine per week    Comment: rarely  . Drug use: No  . Sexual activity: Yes    Birth control/protection: None  Lifestyle  . Physical activity:    Days per week: Not on file    Minutes per session: Not on file  . Stress: Not on file  Relationships  . Social connections:    Talks on phone: Not on file    Gets together: Not on file    Attends religious  service: Not on file    Active member of club or organization: Not on file    Attends meetings of clubs or organizations: Not on file    Relationship status: Not on file  Other Topics Concern  . Not on file  Social History Narrative  . Not on file   Family History  Problem Relation Age of Onset  . Hypertension Mother   . Dementia Father   . Alzheimer's disease Father    - negative except otherwise stated in the family history section Allergies  Allergen Reactions  . Magnesium-Containing Compounds Anaphylaxis  . Sulfamethoxazole-Trimethoprim Swelling and Anaphylaxis    "tongue swelling" "tongue swelling" Pt states tongue swells  . Ibuprofen Other (See Comments)    Kidney issues Kidney issues Shuts kidneys down Kidney issues Shuts kidneys down  . Pregabalin Swelling   Prior to Admission medications   Medication Sig Start Date End Date Taking? Authorizing Provider  amLODipine (NORVASC) 10 MG tablet Take 10 mg by mouth daily.   Yes [provider]  aspirin 325 MG tablet Take 325 mg by mouth daily.   Yes [provider]  citalopram (CELEXA) 20 MG tablet Take 20 mg by mouth daily.   Yes [provider]  clopidogrel (PLAVIX)  75 MG tablet Take 75 mg by mouth daily.   Yes [provider]  cyclobenzaprine (FLEXERIL) 10 MG tablet Take 10 mg by mouth daily.    Yes [provider]  furosemide (LASIX) 20 MG tablet Take 20 mg by mouth daily. 07/02/18  Yes [provider]  gabapentin (NEURONTIN) 300 MG capsule Take 600 mg by mouth 2 (two) times daily.    Yes [provider]  gemfibrozil (LOPID) 600 MG tablet Take 1,200 mg by mouth 2 (two) times daily before a meal.   Yes [provider]  gentamicin ointment (GARAMYCIN) 0.1 % Apply to wounds with each dressing change 05/30/18  Yes Stover, Titorya, DPM  hydrOXYzine (ATARAX/VISTARIL) 10 MG tablet Take 10 mg by mouth daily.  07/02/18  Yes [provider]  insulin  aspart (NOVOLOG FLEXPEN) 100 UNIT/ML FlexPen Inject 60 Units into the skin See admin instructions. 3-4 times daily   Yes [provider]  Insulin Degludec (TRESIBA FLEXTOUCH) 200 UNIT/ML SOPN Inject 154 Units into the skin daily.   Yes [provider]  isosorbide mononitrate (IMDUR) 60 MG 24 hr tablet Take 60 mg by mouth daily.   Yes [provider]  metoprolol (LOPRESSOR) 100 MG tablet Take 100 mg by mouth 2 (two) times daily.   Yes [provider]  NUCYNTA 100 MG TABS Take 100 mg by mouth every 6 (six) hours as needed (chronic pain).  06/28/18  Yes [provider]  omeprazole (PRILOSEC) 20 MG capsule Take 20 mg by mouth daily.   Yes [provider]  pravastatin (PRAVACHOL) 20 MG tablet Take 20 mg by mouth daily.   Yes [provider]  VICTOZA 18 MG/3ML SOPN Inject 1.8 mg into the skin daily.  06/26/18  Yes [provider]   No results found. - pertinent xrays, CT, MRI studies were reviewed and independently interpreted  Positive ROS: All other systems have been reviewed and were otherwise negative with the exception of those mentioned in the HPI and as above.  Physical Exam: General: Alert, no acute distress Psychiatric: Patient is competent for consent with normal mood and affect Lymphatic: No axillary or cervical lymphadenopathy Cardiovascular: No pedal edema Respiratory: No cyanosis, no use of accessory musculature GI: No organomegaly, abdomen is soft and non-tender    Images:  @ENCIMAGES @  Labs:  Lab Results  Component Value Date   HGBA1C 10.1 (H) 09/13/2018   HGBA1C 12.3 (H) 07/09/2014   HGBA1C 7.8 (H) 02/05/2012    Lab Results  Component Value Date   ALBUMIN 2.6 (L) 07/10/2014   ALBUMIN 2.7 (L) 07/07/2014   ALBUMIN 3.5 07/05/2014    Neurologic: Patient does not have protective sensation bilateral lower extremities.   MUSCULOSKELETAL:   Skin: Examination patient has significant swelling in  the left lower extremity compared to the right lower extremity.  He has brawny skin color changes with venous insufficiency pitting edema.  There is a good dorsalis pedis pulse and posterior tibial pulse bilaterally.  No swelling or ulcers in the right lower extremity.  The lateral malleolar ulcer has healed.  Patient has no tenderness to palpation no fluctuance no open wounds.  Review of the CT scan shows massive destruction of the talus and calcaneus with destruction and remodeling through the midfoot.  There are some local fluid collections on the CT scan however these do appear to be consistent with the Charcot arthropathy.  Patient has no open wounds around the foot or ankle no drainage no tenderness to  palpation.  There is instability of the ankle with varus collapse of the ankle and prominence of the distal fibula.  Patient's hindfoot is in approximately 45 degrees of varus.  Hemoglobin A1c uncontrolled at 10.1, January 2020, with most recent albumin at 2.6, November 2015.  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  Clinically patient's pain and swelling seem to come from impingement from his cam walking boot secondary to the unstable Charcot ankle with BMI greater than 46.  Patient has insufficient bone and insufficient stability to weight-bear on the left lower extremity due to the advanced Charcot collapse.  Plan: Patient has insufficient bone in the calcaneus and  talus to provide tibial calcaneal stabilization for a stable weightbearing foot for his BMI greater than 46.  Patient's best option would require a transtibial amputation for stable weightbearing.  I discussed with patient that we could proceed with a transtibial amputation during this hospitalization with discharge to skilled nursing or inpatient rehabilitation.  Discussed that  we could also proceed with a transtibial amputation electively.  Patient states he would prefer to proceed with surgery at this time.  Will plan for left transtibial amputation tomorrow.  Thank you for the consult and the opportunity to see Mr. Kristopher Simon, [place] [phone] 6:47 AM

## 2018-09-13 NOTE — Progress Notes (Signed)
PROGRESS NOTE                                                                                                                                                                                                             Patient Demographics:    Kristopher Simon, is a 53 y.o. male, DOB - 1965/10/13, GYI:[id number]  Admit date - 09/12/2018   Admitting Physician Shela Leff, MD  Outpatient Primary MD for the patient is Martinique, Sarah T, MD  LOS - 1  Chief Complaint: Left foot/ankle pain and swelling  Brief Narrative  Kristopher Simon is a 53 year old male with past medical history of obesity, uncontrolled type 2 diabetes mellitus, stroke, CAD, hypertension, dyslipidemia, Charcot foot deformity on the left followed by podiatrist Dr. Cannon Kettle with multiple surgeries, last surgery on the left foot was in June 2018.  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 Cannon Kettle, this was being treated with local wound care, medahoney etc. for the past 4 to 5 days patient had increased pain and swelling in his lower calf, ankle region, around the same time he had a dental issue which he needed to sort out, he denies any subjective fevers or chills, yesterday due to worsening pain and swelling, he was sent here from [facility] ER for further evaluation and management of this problem.   Subjective:    [place] today has, No headache, No chest pain, No abdominal pain - No Nausea, No new weakness tingling or numbness, No Cough - SOB.     Assessment  & Plan :     1.  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  Failed outpatient treatment and multiple surgeries, for now on empiric IV antibiotics, seen by Dr. Sharol Given will require amputation on 09/14/2018.  Discussed the case with him.  May require placement after that.  Type screen done, PRN IV Lopressor hydralazine added.  2.  History of CAD with stent.  On  aspirin-Plavix, statin and beta-blocker.  Chest pain-free, EKG echo stable.  3.  History of CVA.  Aspirin-Plavix and statin to be continued for secondary prevention.  4.  Morbid obesity.  Follow with PCP for weight loss.  5.  Chronic pain.  Supportive care.  6.  DM type II.  Currently on Levemir, meal NovoLog and sliding scale, I have lowered the dose as he will be n.p.o. after midnight.  Low-dose D5 drip once  n.p.o.  Lab Results  Component Value Date   HGBA1C 10.1 (H) 09/13/2018   CBG (last 3)  Recent Labs    09/12/18 2048 09/13/18 0739 09/13/18 1243  GLUCAP 292* 195* 177*     Family Communication  :  None  Code Status :  Full  Disposition Plan  :  TBD  Consults  :  Dr Sharol Given  Procedures  :    TTE -   - Left ventricle: The cavity size was normal. There was moderate concentric hypertrophy. Systolic function was normal. The   estimated ejection fraction was in the range of 50% to 55%. Wall  motion was normal; there were no regional wall motion   abnormalities. The study is indeterminate for the evaluation of LV diastolic function. - Aortic valve: Trileaflet; mildly thickened, mildly calcified  leaflets. There was no regurgitation. - Mitral valve: Calcified annulus. There was trivial regurgitation. - Right ventricle: Systolic function was normal. - Atrial septum: No defect or patent foramen ovale was identified.  DVT Prophylaxis  :  Lovenox    Lab Results  Component Value Date   PLT 435 (H) 09/13/2018    Diet :  Diet Order            Diet NPO time specified  Diet effective midnight        Diet Carb Modified Fluid consistency: Thin; Room service appropriate? Yes  Diet effective now               Inpatient Medications Scheduled Meds: . aspirin EC  81 mg Oral Daily  . clopidogrel  75 mg Oral Daily  . enoxaparin (LOVENOX) injection  0.5 mg/kg Subcutaneous Q24H  . furosemide  20 mg Oral Q0600  . gabapentin  300 mg Oral TID  . insulin aspart  0-15 Units  Subcutaneous TID WC  . insulin aspart  0-5 Units Subcutaneous QHS  . insulin aspart  30 Units Subcutaneous TID WC  . insulin detemir  60 Units Subcutaneous BID  . metoprolol tartrate  50 mg Oral BID  . pantoprazole  40 mg Oral Q2200  . saccharomyces boulardii  250 mg Oral BID   Continuous Infusions: . piperacillin-tazobactam (ZOSYN)  IV 3.375 g (09/13/18 0834)  . vancomycin 2,000 mg (09/13/18 0528)   PRN Meds:.acetaminophen **OR** acetaminophen, HYDROmorphone, hydrOXYzine, morphine injection, ondansetron **OR** ondansetron (ZOFRAN) IV  Antibiotics  :   Anti-infectives (From admission, onward)   Start     Dose/Rate Route Frequency Ordered Stop   09/13/18 0600  vancomycin (VANCOCIN) 2,000 mg in sodium chloride 0.9 % 500 mL IVPB     2,000 mg 250 mL/hr over 120 Minutes Intravenous Every 12 hours 09/12/18 1703     09/12/18 1800  piperacillin-tazobactam (ZOSYN) IVPB 3.375 g     3.375 g 12.5 mL/hr over 240 Minutes Intravenous Every 8 hours 09/12/18 1703     09/12/18 1715  vancomycin (VANCOCIN) 2,500 mg in sodium chloride 0.9 % 500 mL IVPB     2,500 mg 250 mL/hr over 120 Minutes Intravenous NOW 09/12/18 1647 09/13/18 0120          Objective:   Vitals:   09/12/18 1512 09/12/18 2053 09/13/18 0531 09/13/18 0824  BP: (!) 160/84 (!) 173/93 (!) 145/61 (!) 157/71  Pulse: (!) 109 (!) 103 92 99  Resp: 16 12 16    Temp: 98.7 F (37.1 C) 99.6 F (37.6 C) 98 F (36.7 C)   TempSrc: Oral Oral Oral   SpO2: 99% 94% 97%  Weight: (!) 178.4 kg     Height: 6\' 5"  (1.956 m)       Wt Readings from Last 3 Encounters:  09/12/18 (!) 178.4 kg  07/11/14 (!) 169.8 kg  02/08/12 (!) 161.1 kg     Intake/Output Summary (Last 24 hours) at 09/13/2018 1335 Last data filed at 09/13/2018 0808 Gross per 24 hour  Intake 1300 ml  Output 950 ml  Net 350 ml     Physical Exam  Awake Alert, Oriented X 3, No new F.N deficits, Normal affect [place].AT,PERRAL Supple Neck,No JVD, No cervical lymphadenopathy  appriciated.  Symmetrical Chest wall movement, Good air movement bilaterally, CTAB RRR,No Gallops,Rubs or new Murmurs, No Parasternal Heave +ve B.Sounds, Abd Soft, No tenderness, No organomegaly appriciated, No rebound - guarding or rigidity. No Cyanosis, Clubbing or edema, No new Rash or bruise, left foot Charcot joint     Data Review:    CBC Recent Labs  Lab 09/12/18 1938 09/13/18 0502  WBC 10.8* 10.5  HGB 11.2* 11.1*  HCT 35.4* 34.9*  PLT 449* 435*  MCV 81.4 82.5  MCH 25.7* 26.2  MCHC 31.6 31.8  RDW 15.9* 16.1*    Chemistries  Recent Labs  Lab 09/12/18 1938 09/13/18 0502  NA  --  135  K  --  3.3*  CL  --  100  CO2  --  25  GLUCOSE  --  168*  BUN  --  9  CREATININE 0.99 0.91  CALCIUM  --  8.7*   ------------------------------------------------------------------------------------------------------------------ No results for input(s): CHOL, HDL, LDLCALC, TRIG, CHOLHDL, LDLDIRECT in the last 72 hours.  Lab Results  Component Value Date   HGBA1C 10.1 (H) 09/13/2018   ------------------------------------------------------------------------------------------------------------------ No results for input(s): TSH, T4TOTAL, T3FREE, THYROIDAB in the last 72 hours.  Invalid input(s): FREET3 ------------------------------------------------------------------------------------------------------------------ No results for input(s): VITAMINB12, FOLATE, FERRITIN, TIBC, IRON, RETICCTPCT in the last 72 hours.  Coagulation profile No results for input(s): INR, PROTIME in the last 168 hours.  No results for input(s): DDIMER in the last 72 hours.  Cardiac Enzymes No results for input(s): CKMB, TROPONINI, MYOGLOBIN in the last 168 hours.  Invalid input(s): CK ------------------------------------------------------------------------------------------------------------------ No results found for: BNP  Micro Results No results found for this or any previous visit (from the  past 240 hour(s)).  Radiology Reports No results found.  Time Spent in minutes  30   Lala Lund M.D on 09/13/2018 at 1:35 PM  To page go to [email or web address] - password [facility]

## 2018-09-13 NOTE — Progress Notes (Signed)
  Echocardiogram 2D Echocardiogram has been performed.  Jennette Dubin 09/13/2018, 9:49 AM

## 2018-09-14 ENCOUNTER — Inpatient Hospital Stay (HOSPITAL_COMMUNITY): Payer: Medicaid Other | Admitting: Anesthesiology

## 2018-09-14 ENCOUNTER — Encounter (HOSPITAL_COMMUNITY)
Admission: AD | Disposition: A | Discharge status: Rehabilitation Facility | Payer: Self-pay | Source: Other Acute Inpatient Hospital | Attending: Internal Medicine

## 2018-09-14 ENCOUNTER — Encounter (HOSPITAL_COMMUNITY): Payer: Self-pay | Admitting: Certified Registered"

## 2018-09-14 DIAGNOSIS — L02612 Cutaneous abscess of left foot: Secondary | ICD-10-CM

## 2018-09-14 HISTORY — PX: AMPUTATION: SHX166

## 2018-09-14 LAB — BASIC METABOLIC PANEL
Anion gap: 9 (ref 5–15)
BUN: 8 mg/dL (ref 6–20)
CO2: 26 mmol/L (ref 22–32)
Calcium: 8.7 mg/dL — ABNORMAL LOW (ref 8.9–10.3)
Chloride: 101 mmol/L (ref 98–111)
Creatinine, Ser: 1.16 mg/dL (ref 0.61–1.24)
GFR calc Af Amer: >60 mL/min (ref >60)
GFR calc non Af Amer: >60 mL/min (ref >60)
Glucose, Bld: 395 mg/dL — ABNORMAL HIGH (ref 70–99)
Potassium: 3.9 mmol/L (ref 3.5–5.1)
Sodium: 136 mmol/L (ref 135–145)

## 2018-09-14 LAB — MAGNESIUM: Magnesium: 1.9 mg/dL (ref 1.7–2.4)

## 2018-09-14 LAB — GLUCOSE, CAPILLARY
Glucose-Capillary: 373 mg/dL — ABNORMAL HIGH (ref 70–99)
Glucose-Capillary: 376 mg/dL — ABNORMAL HIGH (ref 70–99)
Glucose-Capillary: 372 mg/dL — ABNORMAL HIGH (ref 70–99)
Glucose-Capillary: 372 mg/dL — ABNORMAL HIGH (ref 70–99)
Glucose-Capillary: 323 mg/dL — ABNORMAL HIGH (ref 70–99)
Glucose-Capillary: 189 mg/dL — ABNORMAL HIGH (ref 70–99)
Glucose-Capillary: 140 mg/dL — ABNORMAL HIGH (ref 70–99)

## 2018-09-14 LAB — CBC
HEMATOCRIT: 36.3 % — AB (ref 39.0–52.0)
Hemoglobin: 11.1 g/dL — ABNORMAL LOW (ref 13.0–17.0)
MCH: 25.3 pg — ABNORMAL LOW (ref 26.0–34.0)
MCHC: 30.6 g/dL (ref 30.0–36.0)
MCV: 82.7 fL (ref 80.0–100.0)
Platelets: 403 10*3/uL — ABNORMAL HIGH (ref 150–400)
RBC: 4.39 MIL/uL (ref 4.22–5.81)
RDW: 16.2 % — AB (ref 11.5–15.5)
WBC: 9.2 10*3/uL (ref 4.0–10.5)
nRBC: 0 % (ref 0.0–0.2)

## 2018-09-14 SURGERY — AMPUTATION BELOW KNEE
Anesthesia: General | Laterality: Left

## 2018-09-14 MED ORDER — CLONIDINE HCL (ANALGESIA) 100 MCG/ML EP SOLN
EPIDURAL | Status: DC | PRN
  Administered 2018-09-14: 100 ug

## 2018-09-14 MED ORDER — HYDRALAZINE HCL 25 MG PO TABS
25.0000 mg | ORAL_TABLET | Freq: Three times a day (TID) | ORAL | Status: DC
  Administered 2018-09-14 – 2018-09-17 (×11): 25 mg via ORAL
  Filled 2018-09-14 (×11): qty 1

## 2018-09-14 MED ORDER — HYDROMORPHONE HCL 1 MG/ML IJ SOLN
INTRAMUSCULAR | Status: DC | PRN
  Administered 2018-09-14: 0.5 mg via INTRAVENOUS

## 2018-09-14 MED ORDER — MIDAZOLAM HCL 5 MG/5ML IJ SOLN
INTRAMUSCULAR | Status: DC | PRN
  Administered 2018-09-14: 2 mg via INTRAVENOUS

## 2018-09-14 MED ORDER — MEPERIDINE HCL 50 MG/ML IJ SOLN: 6.2500 mg | INTRAMUSCULAR | Status: DC | PRN

## 2018-09-14 MED ORDER — HYDRALAZINE HCL 20 MG/ML IJ SOLN: 10.0000 mg | Freq: Four times a day (QID) | INTRAMUSCULAR | Status: DC | PRN

## 2018-09-14 MED ORDER — OXYCODONE HCL 5 MG PO TABS
5.0000 mg | ORAL_TABLET | ORAL | Status: DC | PRN
  Administered 2018-09-16 – 2018-09-17 (×5): 10 mg via ORAL
  Filled 2018-09-14 (×6): qty 2

## 2018-09-14 MED ORDER — HYDROMORPHONE HCL 1 MG/ML IJ SOLN
0.5000 mg | INTRAMUSCULAR | Status: DC | PRN
  Administered 2018-09-14 – 2018-09-15 (×4): 1 mg via INTRAVENOUS
  Filled 2018-09-14 (×4): qty 1
  Filled 2018-09-14: qty 0.5

## 2018-09-14 MED ORDER — PROPOFOL 10 MG/ML IV BOLUS
INTRAVENOUS | Status: AC
  Filled 2018-09-14: qty 20

## 2018-09-14 MED ORDER — ACETAMINOPHEN 325 MG PO TABS
325.0000 mg | ORAL_TABLET | Freq: Four times a day (QID) | ORAL | Status: DC | PRN
  Administered 2018-09-16: 650 mg via ORAL
  Filled 2018-09-14: qty 2

## 2018-09-14 MED ORDER — ESMOLOL HCL 100 MG/10ML IV SOLN
INTRAVENOUS | Status: DC | PRN
  Administered 2018-09-14: 30 mg via INTRAVENOUS

## 2018-09-14 MED ORDER — DOCUSATE SODIUM 100 MG PO CAPS
100.0000 mg | ORAL_CAPSULE | Freq: Two times a day (BID) | ORAL | Status: DC
  Administered 2018-09-14 – 2018-09-17 (×7): 100 mg via ORAL
  Filled 2018-09-14 (×7): qty 1

## 2018-09-14 MED ORDER — METOCLOPRAMIDE HCL 10 MG PO TABS: 5.0000 mg | ORAL_TABLET | Freq: Three times a day (TID) | ORAL | Status: DC | PRN

## 2018-09-14 MED ORDER — METOCLOPRAMIDE HCL 5 MG/ML IJ SOLN: 5.0000 mg | Freq: Three times a day (TID) | INTRAMUSCULAR | Status: DC | PRN

## 2018-09-14 MED ORDER — METOPROLOL TARTRATE 5 MG/5ML IV SOLN: 5.0000 mg | Freq: Four times a day (QID) | INTRAVENOUS | Status: DC | PRN

## 2018-09-14 MED ORDER — METHOCARBAMOL 1000 MG/10ML IJ SOLN
500.0000 mg | Freq: Four times a day (QID) | INTRAVENOUS | Status: DC | PRN
  Filled 2018-09-14: qty 5

## 2018-09-14 MED ORDER — KETAMINE HCL 10 MG/ML IJ SOLN
INTRAMUSCULAR | Status: DC | PRN
  Administered 2018-09-14: 50 mg via INTRAVENOUS

## 2018-09-14 MED ORDER — FENTANYL CITRATE (PF) 250 MCG/5ML IJ SOLN
INTRAMUSCULAR | Status: AC
  Filled 2018-09-14: qty 5

## 2018-09-14 MED ORDER — BISACODYL 10 MG RE SUPP: 10.0000 mg | Freq: Every day | RECTAL | Status: DC | PRN

## 2018-09-14 MED ORDER — METHOCARBAMOL 500 MG PO TABS
500.0000 mg | ORAL_TABLET | Freq: Four times a day (QID) | ORAL | Status: DC | PRN
  Administered 2018-09-15: 500 mg via ORAL
  Filled 2018-09-14: qty 1

## 2018-09-14 MED ORDER — DEXAMETHASONE SODIUM PHOSPHATE 10 MG/ML IJ SOLN
INTRAMUSCULAR | Status: AC
  Filled 2018-09-14: qty 1

## 2018-09-14 MED ORDER — FENTANYL CITRATE (PF) 100 MCG/2ML IJ SOLN
25.0000 ug | INTRAMUSCULAR | Status: DC | PRN
  Administered 2018-09-14 (×2): 50 ug via INTRAVENOUS

## 2018-09-14 MED ORDER — SODIUM CHLORIDE 0.9 % IV SOLN
INTRAVENOUS | Status: DC
  Administered 2018-09-14: 14:00:00 via INTRAVENOUS

## 2018-09-14 MED ORDER — MIDAZOLAM HCL 5 MG/5ML IJ SOLN: INTRAMUSCULAR | Status: DC | PRN

## 2018-09-14 MED ORDER — POLYETHYLENE GLYCOL 3350 17 G PO PACK: 17.0000 g | PACK | Freq: Every day | ORAL | Status: DC | PRN

## 2018-09-14 MED ORDER — ONDANSETRON HCL 4 MG/2ML IJ SOLN: 4.0000 mg | Freq: Once | INTRAMUSCULAR | Status: DC | PRN

## 2018-09-14 MED ORDER — PROPOFOL 10 MG/ML IV BOLUS
INTRAVENOUS | Status: AC
  Filled 2018-09-14: qty 40

## 2018-09-14 MED ORDER — HYDROMORPHONE HCL 1 MG/ML IJ SOLN
INTRAMUSCULAR | Status: AC
  Filled 2018-09-14: qty 0.5

## 2018-09-14 MED ORDER — FENTANYL CITRATE (PF) 100 MCG/2ML IJ SOLN
INTRAMUSCULAR | Status: AC
  Filled 2018-09-14: qty 2

## 2018-09-14 MED ORDER — OXYCODONE HCL 5 MG PO TABS
ORAL_TABLET | ORAL | Status: AC
  Filled 2018-09-14: qty 1

## 2018-09-14 MED ORDER — ONDANSETRON HCL 4 MG/2ML IJ SOLN: 4.0000 mg | Freq: Four times a day (QID) | INTRAMUSCULAR | Status: DC | PRN

## 2018-09-14 MED ORDER — ROPIVACAINE HCL 7.5 MG/ML IJ SOLN
INTRAMUSCULAR | Status: DC | PRN
  Administered 2018-09-14: 30 mL via PERINEURAL

## 2018-09-14 MED ORDER — FENTANYL CITRATE (PF) 100 MCG/2ML IJ SOLN
INTRAMUSCULAR | Status: DC | PRN
  Administered 2018-09-14 (×2): 50 ug via INTRAVENOUS
  Administered 2018-09-14: 100 ug via INTRAVENOUS
  Administered 2018-09-14: 50 ug via INTRAVENOUS

## 2018-09-14 MED ORDER — KETAMINE HCL 50 MG/5ML IJ SOSY
PREFILLED_SYRINGE | INTRAMUSCULAR | Status: AC
  Filled 2018-09-14: qty 5

## 2018-09-14 MED ORDER — INSULIN DETEMIR 100 UNIT/ML ~~LOC~~ SOLN
60.0000 [IU] | Freq: Two times a day (BID) | SUBCUTANEOUS | Status: DC
  Administered 2018-09-14 – 2018-09-15 (×4): 60 [IU] via SUBCUTANEOUS
  Filled 2018-09-14 (×5): qty 0.6

## 2018-09-14 MED ORDER — ONDANSETRON HCL 4 MG/2ML IJ SOLN
INTRAMUSCULAR | Status: AC
  Filled 2018-09-14: qty 2

## 2018-09-14 MED ORDER — LACTATED RINGERS IV SOLN
INTRAVENOUS | Status: DC | PRN
  Administered 2018-09-14: 09:00:00 via INTRAVENOUS

## 2018-09-14 MED ORDER — INSULIN ASPART 100 UNIT/ML ~~LOC~~ SOLN
SUBCUTANEOUS | Status: AC
  Administered 2018-09-14: 15 [IU] via SUBCUTANEOUS
  Filled 2018-09-14: qty 1

## 2018-09-14 MED ORDER — LIDOCAINE HCL (CARDIAC) PF 100 MG/5ML IV SOSY
PREFILLED_SYRINGE | INTRAVENOUS | Status: DC | PRN
  Administered 2018-09-14: 100 mg via INTRAVENOUS

## 2018-09-14 MED ORDER — LIDOCAINE 2% (20 MG/ML) 5 ML SYRINGE
INTRAMUSCULAR | Status: AC
  Filled 2018-09-14: qty 5

## 2018-09-14 MED ORDER — DEXMEDETOMIDINE HCL 200 MCG/2ML IV SOLN
INTRAVENOUS | Status: DC | PRN
  Administered 2018-09-14 (×2): 8 ug via INTRAVENOUS
  Administered 2018-09-14 (×2): 12 ug via INTRAVENOUS

## 2018-09-14 MED ORDER — OXYCODONE HCL 5 MG PO TABS
5.0000 mg | ORAL_TABLET | Freq: Once | ORAL | Status: AC | PRN
  Administered 2018-09-14: 5 mg via ORAL

## 2018-09-14 MED ORDER — ACETAMINOPHEN 160 MG/5ML PO SOLN: 325.0000 mg | ORAL | Status: DC | PRN

## 2018-09-14 MED ORDER — MAGNESIUM CITRATE PO SOLN: 1.0000 | Freq: Once | ORAL | Status: DC | PRN

## 2018-09-14 MED ORDER — ONDANSETRON HCL 4 MG PO TABS: 4.0000 mg | ORAL_TABLET | Freq: Four times a day (QID) | ORAL | Status: DC | PRN

## 2018-09-14 MED ORDER — OXYCODONE HCL 5 MG PO TABS
10.0000 mg | ORAL_TABLET | ORAL | Status: DC | PRN
  Administered 2018-09-14 (×2): 10 mg via ORAL
  Filled 2018-09-14: qty 2

## 2018-09-14 MED ORDER — 0.9 % SODIUM CHLORIDE (POUR BTL) OPTIME
TOPICAL | Status: DC | PRN
  Administered 2018-09-14: 1000 mL

## 2018-09-14 MED ORDER — ACETAMINOPHEN 325 MG PO TABS: 325.0000 mg | ORAL_TABLET | ORAL | Status: DC | PRN

## 2018-09-14 MED ORDER — MIDAZOLAM HCL 2 MG/2ML IJ SOLN
INTRAMUSCULAR | Status: AC
  Filled 2018-09-14: qty 2

## 2018-09-14 MED ORDER — PROPOFOL 10 MG/ML IV BOLUS
INTRAVENOUS | Status: DC | PRN
  Administered 2018-09-14 (×2): 50 mg via INTRAVENOUS
  Administered 2018-09-14: 100 mg via INTRAVENOUS
  Administered 2018-09-14 (×2): 50 mg via INTRAVENOUS
  Administered 2018-09-14: 200 mg via INTRAVENOUS

## 2018-09-14 MED ORDER — OXYCODONE HCL 5 MG/5ML PO SOLN: 5.0000 mg | Freq: Once | ORAL | Status: AC | PRN

## 2018-09-14 MED ORDER — INSULIN ASPART 100 UNIT/ML ~~LOC~~ SOLN
15.0000 [IU] | Freq: Once | SUBCUTANEOUS | Status: AC
  Administered 2018-09-14: 15 [IU] via SUBCUTANEOUS

## 2018-09-14 SURGICAL SUPPLY — 39 items
BENZOIN TINCTURE PRP APPL 2/3 (GAUZE/BANDAGES/DRESSINGS) ×12 IMPLANT
BLADE SAW RECIP 87.9 MT (BLADE) ×3 IMPLANT
BLADE SURG 21 STRL SS (BLADE) ×3 IMPLANT
BNDG COHESIVE 6X5 TAN STRL LF (GAUZE/BANDAGES/DRESSINGS) ×6 IMPLANT
BNDG GAUZE ELAST 4 BULKY (GAUZE/BANDAGES/DRESSINGS) ×6 IMPLANT
COVER SURGICAL LIGHT HANDLE (MISCELLANEOUS) ×3 IMPLANT
COVER WAND RF STERILE (DRAPES) ×3 IMPLANT
CUFF TOURNIQUET SINGLE 34IN LL (TOURNIQUET CUFF) IMPLANT
CUFF TOURNIQUET SINGLE 44IN (TOURNIQUET CUFF) IMPLANT
DRAPE INCISE IOBAN 66X45 STRL (DRAPES) IMPLANT
DRAPE U-SHAPE 47X51 STRL (DRAPES) ×3 IMPLANT
DRESSING PREVENA PLUS CUSTOM (GAUZE/BANDAGES/DRESSINGS) ×1 IMPLANT
DRSG PREVENA PLUS CUSTOM (GAUZE/BANDAGES/DRESSINGS) ×3
DURAPREP 26ML APPLICATOR (WOUND CARE) ×3 IMPLANT
ELECT REM PT RETURN 9FT ADLT (ELECTROSURGICAL) ×3
ELECTRODE REM PT RTRN 9FT ADLT (ELECTROSURGICAL) ×1 IMPLANT
GLOVE BIOGEL PI IND STRL 9 (GLOVE) ×1 IMPLANT
GLOVE BIOGEL PI INDICATOR 9 (GLOVE) ×2
GLOVE SURG ORTHO 9.0 STRL STRW (GLOVE) ×3 IMPLANT
GOWN STRL REUS W/ TWL XL LVL3 (GOWN DISPOSABLE) ×2 IMPLANT
GOWN STRL REUS W/TWL XL LVL3 (GOWN DISPOSABLE) ×4
KIT BASIN OR (CUSTOM PROCEDURE TRAY) ×3 IMPLANT
KIT TURNOVER KIT B (KITS) ×3 IMPLANT
MANIFOLD NEPTUNE II (INSTRUMENTS) ×3 IMPLANT
NS IRRIG 1000ML POUR BTL (IV SOLUTION) ×3 IMPLANT
PACK ORTHO EXTREMITY (CUSTOM PROCEDURE TRAY) ×3 IMPLANT
PAD ARMBOARD 7.5X6 YLW CONV (MISCELLANEOUS) ×3 IMPLANT
PREVENA RESTOR ARTHOFORM 46X30 (CANNISTER) ×2 IMPLANT
SPONGE LAP 18X18 X RAY DECT (DISPOSABLE) IMPLANT
STAPLER VISISTAT 35W (STAPLE) IMPLANT
STOCKINETTE IMPERVIOUS LG (DRAPES) ×3 IMPLANT
SUT SILK 2 0 (SUTURE) ×2
SUT SILK 2-0 18XBRD TIE 12 (SUTURE) ×1 IMPLANT
SUT VIC AB 1 CTX 27 (SUTURE) IMPLANT
TOWEL OR 17X26 10 PK STRL BLUE (TOWEL DISPOSABLE) ×3 IMPLANT
TUBE CONNECTING 12'X1/4 (SUCTIONS) ×1
TUBE CONNECTING 12X1/4 (SUCTIONS) ×2 IMPLANT
WND VAC CANISTER 500ML (MISCELLANEOUS) ×2 IMPLANT
YANKAUER SUCT BULB TIP NO VENT (SUCTIONS) ×3 IMPLANT

## 2018-09-14 NOTE — Anesthesia Procedure Notes (Signed)
Anesthesia Regional Block: Popliteal block   Pre-Anesthetic Checklist: ,, timeout performed, Correct Patient, Correct Site, Correct Laterality, Correct Procedure, Correct Position, site marked, Risks and benefits discussed,  Surgical consent,  Pre-op evaluation,  At surgeon's request and post-op pain management  Laterality: Left  Prep: chloraprep       Needles:  Injection technique: Single-shot  Needle Type: Echogenic Stimulator Needle     Needle Length: 5cm  Needle Gauge: 22     Additional Needles:   Procedures:, nerve stimulator,,, ultrasound used (permanent image in chart),,,,   Nerve Stimulator or Paresthesia:  Response: foot, 0.45 mA,   Additional Responses:   Narrative:  Start time: 09/14/2018 8:50 AM End time: 09/14/2018 9:01 AM Injection made incrementally with aspirations every 5 mL.  Performed by: Personally  Anesthesiologist: Janeece Riggers, MD  Additional Notes: Functioning IV was confirmed and monitors were applied.  A 64mm 22ga Arrow echogenic stimulator needle was used. Sterile prep and drape,hand hygiene and sterile gloves were used. Ultrasound guidance: relevant anatomy identified, needle position confirmed, local anesthetic spread visualized around nerve(s)., vascular puncture avoided.  Image printed for medical record. Negative aspiration and negative test dose prior to incremental administration of local anesthetic. The patient tolerated the procedure well.

## 2018-09-14 NOTE — Transfer of Care (Signed)
Immediate Anesthesia Transfer of Care Note  Patient: Adith Tejada  Procedure(s) Performed: LEFT BELOW KNEE AMPUTATION (Left )  Patient Location: PACU  Anesthesia Type:General  Level of Consciousness: sedated  Airway & Oxygen Therapy: Patient Spontanous Breathing and Patient connected to face mask oxygen  Post-op Assessment: Report given to RN and Post -op Vital signs reviewed and stable  Post vital signs: Reviewed and stable  Last Vitals:  Vitals Value Taken Time  BP    Temp    Pulse 95 09/14/2018 10:11 AM  Resp 21 09/14/2018 10:11 AM  SpO2 97 % 09/14/2018 10:11 AM  Vitals shown include unvalidated device data.  Last Pain:  Vitals:   09/14/18 0641  TempSrc: Oral  PainSc:          Complications: No apparent anesthesia complications

## 2018-09-14 NOTE — Progress Notes (Addendum)
Inpatient Diabetes Program Recommendations  AACE/ADA: New Consensus Statement on Inpatient Glycemic Control (2015)  Target Ranges:  Prepandial:   less than 140 mg/dL      Peak postprandial:   less than 180 mg/dL (1-2 hours)      Critically ill patients:  140 - 180 mg/dL   Lab Results  Component Value Date   GLUCAP 372 (H) 09/14/2018   HGBA1C 10.1 (H) 09/13/2018    Review of Glycemic Control Results for Kristopher Simon, Kristopher Simon (MRN [id number]) as of 09/14/2018 12:09  Ref. Range 09/14/2018 06:40 09/14/2018 08:00 09/14/2018 10:10 09/14/2018 11:01  Glucose-Capillary Latest Ref Range: 70 - 99 mg/dL 373 (H) 376 (H) 372 (H) 372 (H)   Diabetes history: Type 2 DM Outpatient Diabetes medications: Tresiba 154 units QD, Novolog 30 units TID, Victoza 1.8 mg QD Current orders for Inpatient glycemic control: Novolog 20 units TID, Novolog 0-5 units QHS, Novolog 0-15 units TID, Levemir 60 units BID  Inpatient Diabetes Program Recommendations:    Noted Levemir adjustment due to patient NPO for procedure. This may explain increased blood glucose trends. Discussed time to reschedule Levemir following procedure with Dr Candiss Norse, orders placed. Also, the discontinuation of D5% may be helpful.  Following this adjustment, if post prandials remain elevated above 180 mg/dL, could consider increasing Novolog 24 units TID (assuming patient is consuming >50% of meal).    Addendum@ 6333: LKTGY with patient regarding outpatient management of DM.  Reviewed patient's current A1c of 10.1%. Explained what a A1c is and what it measures. Also reviewed goal A1c with patient, importance of good glucose control @ home, and blood sugar goals. Reviewed patho of DM, role of pancreas, vascular changes and commorbidities.  Patient has a meter and usually checks CBGs 3 times per day. Patient is in the process of obtaining a Dexcom and getting a insulin pump. Sees Dr Donzetta Matters and a dietitian/pump trainer who has been working with this patient. His next  appointment is Feb 4th. Discussed the importance of viewing the insulin pump as a tool, instead of a "fix". Reports eating large amounts of fast food and consuming sugary beverages. Discussed the importance of setting dietary guidelines and beginning to be more aware of nutritional value and total carbohydrates within foods. Will place dietitian consult and outpatient referral. Patient to follow up with endo and has no further questions.   Thanks, Bronson Curb, MSN, RNC-OB Diabetes Coordinator [phone] (8a-5p)

## 2018-09-14 NOTE — Care Management Note (Signed)
Case Management Note  Patient Details  Name: Colbe Viviano MRN: [id number] Date of Birth: [birth year]  Subjective/Objective:         Left foot/ankle pain and swelling.Hx of obesity, uncontrolled type 2 diabetes mellitus, stroke, CAD, hypertension, dyslipidemia, Charcot foot deformity on the left / multiple surgeries. Resides with mom. Owns walker.        Bethena Midget (Sister) Deontez Klinke (Mother)    [phone]      PCP: Sarah Martinique  S/p Transtibial amputation Application of Prevena wound VAC, 1/24/20120  Action/Plan: Pt states would like to d/c to home. States has supportive family....  PT evaluation pending... NCM following for TOC needs. CSW aware of potential need for SNF placemnent   Expected Discharge Date:                  Expected Discharge Plan:     In-House Referral:   CSW  Discharge planning Services     Post Acute Care Choice:    Choice offered to:     DME Arranged:    DME Agency:     HH Arranged:    HH Agency:     Status of Service:     If discussed at [employer], dates discussed:    Additional Comments:  Sharin Mons, RN 09/14/2018, 12:54 PM

## 2018-09-14 NOTE — Op Note (Signed)
   Date of Surgery: 09/14/2018  INDICATIONS: Kristopher Simon is a 53 y.o.-year-old male who has a Charcot with infection left foot of the left foot with diabetic insensate neuropathy has failed conservative care MRI scan is suggestive of this deep infection.  Treatment options were discussed patient states he wishes to proceed with a transtibial amputation.Marland Kitchen  PREOPERATIVE DIAGNOSIS: Charcot collapse with infection left foot  POSTOPERATIVE DIAGNOSIS: Same.  PROCEDURE: Transtibial amputation Application of Prevena wound VAC  SURGEON: Sharol Given, M.D.  ANESTHESIA:  general  IV FLUIDS AND URINE: See anesthesia.  ESTIMATED BLOOD LOSS: min mL.  COMPLICATIONS: None.  DESCRIPTION OF PROCEDURE: The patient was brought to the operating room and underwent a general anesthetic. After adequate levels of anesthesia were obtained patient's lower extremity was prepped using DuraPrep draped into a sterile field. A timeout was called. The foot was draped out of the sterile field with impervious stockinette. A transverse incision was made 11 cm distal to the tibial tubercle. This curved proximally and a large posterior flap was created. The tibia was transected 1 cm proximal to the skin incision. The fibula was transected just proximal to the tibial incision. The tibia was beveled anteriorly. A large posterior flap was created. The sciatic nerve was pulled cut and allowed to retract. The vascular bundles were suture ligated with 2-0 silk. The deep and superficial fascial layers were closed using #1 Vicryl. The skin was closed using staples and 2-0 nylon. The wound was covered with a Prevena wound VAC. There was a good suction fit. A prosthetic shrinker was applied. Patient was extubated taken to the PACU in stable condition.   DISCHARGE PLANNING:  Antibiotic duration: Continue IV antibiotics for 3 days.  Patient had some nonviable muscle in the gastroc anemias muscles  Weightbearing: Nonweightbearing on the  left  Pain medication: Opioid pathway ordered  Dressing care/ Wound VAC: Continue wound VAC for 1 to 2 weeks  Discharge to: Discharge to inpatient versus outpatient rehabilitation.  Follow-up: In the office 1 week post operative.  Meridee Score, MD [place] 10:04 AM

## 2018-09-14 NOTE — Evaluation (Addendum)
Physical Therapy Evaluation Patient Details Name: Kristopher Simon MRN: [id number] DOB: 08/31/1965 Today's Date: 09/14/2018   History of Present Illness  Pt is a 53 y/o male s/p L transtibial amputation secondary to Charcot foot deformity. PMH including but not limited to CAD, HTN, DM, stroke and anxiety.    Clinical Impression  Pt presented supine in bed with HOB elevated, awake and willing to participate in therapy session. Pt's sister and mother were present throughout session. Prior to admission, pt reported that he ambulated with use of RW and was independent with ADLs. Pt lives with his mother in a one level home with a ramped entrance. He currently requires min guard for safety with bed mobility and transfers. Min A x2 for safety and stability with pre-gait activity of hopping on R LE with bilateral UEs on RW. Pt would greatly benefit from further intensive therapy services to maximize his independence with functional mobility prior to returning home with family support. PT will continue to follow acutely to progress mobility as tolerated.     Follow Up Recommendations CIR    Equipment Recommendations  None recommended by PT    Recommendations for Other Services Rehab consult     Precautions / Restrictions Precautions Precautions: Fall Restrictions Weight Bearing Restrictions: Yes LLE Weight Bearing: Non weight bearing      Mobility  Bed Mobility Overal bed mobility: Needs Assistance Bed Mobility: Supine to Sit;Sit to Supine     Supine to sit: Min guard Sit to supine: Min guard   General bed mobility comments: increased time and effort, HOB elevated, use of bed rail, min guard for safety  Transfers Overall transfer level: Needs assistance Equipment used: Rolling walker (2 wheeled) Transfers: Sit to/from Stand Sit to Stand: Min guard         General transfer comment: increased time and effort, min guard for safety, pt pulling up on RW with bilateral UEs despite cueing  for safe hand placement  Ambulation/Gait             General Gait Details: pt able to hop x2 in place on R LE with min A x2 for safety and stability  Stairs            Wheelchair Mobility    Modified Rankin (Stroke Patients Only)       Balance Overall balance assessment: Needs assistance   Sitting balance-Leahy Scale: Fair Sitting balance - Comments: pt able to sit EOB with supervision   Standing balance support: Bilateral upper extremity supported Standing balance-Leahy Scale: Poor Standing balance comment: reliant on bilateral UEs on RW                             Pertinent Vitals/Pain Pain Assessment: Faces Faces Pain Scale: Hurts even more Pain Location: L residual limb Pain Descriptors / Indicators: Burning;Throbbing;Sore Pain Intervention(s): Monitored during session    Home Living Family/patient expects to be discharged to:: Private residence Living Arrangements: Parent Available Help at Discharge: Family;Available 24 hours/day Type of Home: House Home Access: Ramped entrance     Home Layout: One level Home Equipment: Walker - 2 wheels      Prior Function Level of Independence: Independent with assistive device(s)         Comments: ambulates with RW     Hand Dominance        Extremity/Trunk Assessment   Upper Extremity Assessment Upper Extremity Assessment: Overall WFL for tasks assessed  Lower Extremity Assessment Lower Extremity Assessment: Overall WFL for tasks assessed;LLE deficits/detail LLE Deficits / Details: wound VAC in place at distal aspect, pt with good knee ROM and able to achieve full knee extension LLE Sensation: decreased light touch       Communication   Communication: No difficulties  Cognition Arousal/Alertness: Awake/alert Behavior During Therapy: WFL for tasks assessed/performed Overall Cognitive Status: Within Functional Limits for tasks assessed                                         General Comments      Exercises Amputee Exercises Quad Sets: AROM;Strengthening;Left;Supine Knee Extension: AROM;Strengthening;Left;Standing Straight Leg Raises: AROM;Strengthening;Left;Supine   Assessment/Plan    PT Assessment Patient needs continued PT services  PT Problem List Decreased activity tolerance;Decreased balance;Decreased mobility;Decreased coordination;Decreased knowledge of use of DME;Decreased safety awareness;Decreased knowledge of precautions;Pain       PT Treatment Interventions DME instruction;Gait training;Functional mobility training;Therapeutic exercise;Therapeutic activities;Balance training;Neuromuscular re-education;Patient/family education    PT Goals (Current goals can be found in the Care Plan section)  Acute Rehab PT Goals Patient Stated Goal: to get a prosthesis PT Goal Formulation: With patient/family Time For Goal Achievement: 09/28/18 Potential to Achieve Goals: Good    Frequency Min 5X/week   Barriers to discharge        Co-evaluation               AM-PAC PT "6 Clicks" Mobility  Outcome Measure Help needed turning from your back to your side while in a flat bed without using bedrails?: None Help needed moving from lying on your back to sitting on the side of a flat bed without using bedrails?: None Help needed moving to and from a bed to a chair (including a wheelchair)?: A Lot Help needed standing up from a chair using your arms (e.g., wheelchair or bedside chair)?: A Little Help needed to walk in hospital room?: Total Help needed climbing 3-5 steps with a railing? : Total 6 Click Score: 15    End of Session Equipment Utilized During Treatment: Gait belt Activity Tolerance: Patient tolerated treatment well Patient left: in bed;with call bell/phone within reach;with family/visitor present Nurse Communication: Mobility status PT Visit Diagnosis: Other abnormalities of gait and mobility (R26.89);Pain Pain -  Right/Left: Left Pain - part of body: Leg    Time: 0940-7680 PT Time Calculation (min) (ACUTE ONLY): 35 min   Charges:   PT Evaluation $PT Eval Moderate Complexity: 1 Mod PT Treatments $Neuromuscular Re-education: 8-22 mins        Sherie Don, PT, DPT  Acute Rehabilitation Services Pager [phone] Office [place] 09/14/2018, 4:16 PM

## 2018-09-14 NOTE — Interval H&P Note (Signed)
History and Physical Interval Note:  09/14/2018 6:46 AM  Kristopher Simon  has presented today for surgery, with the diagnosis of Charcot Collapse Left Foot  The various methods of treatment have been discussed with the patient and family. After consideration of risks, benefits and other options for treatment, the patient has consented to  Procedure(s): LEFT BELOW KNEE AMPUTATION (Left) as a surgical intervention .  The patient's history has been reviewed, patient examined, no change in status, stable for surgery.  I have reviewed the patient's chart and labs.  Questions were answered to the patient's satisfaction.     Newt Minion

## 2018-09-14 NOTE — Anesthesia Postprocedure Evaluation (Signed)
Anesthesia Post Note  Patient: Kristopher Simon  Procedure(s) Performed: LEFT BELOW KNEE AMPUTATION (Left )     Patient location during evaluation: PACU Anesthesia Type: General Level of consciousness: awake and alert Pain management: pain level controlled Vital Signs Assessment: post-procedure vital signs reviewed and stable Respiratory status: spontaneous breathing, nonlabored ventilation, respiratory function stable and patient connected to nasal cannula oxygen Cardiovascular status: blood pressure returned to baseline and stable Postop Assessment: no apparent nausea or vomiting Anesthetic complications: no    Last Vitals:  Vitals:   09/14/18 1107 09/14/18 1133  BP: 133/68 131/71  Pulse: 86 85  Resp: 16 16  Temp: 36.8 C 37.2 C  SpO2: 96% 97%    Last Pain:  Vitals:   09/14/18 1239  TempSrc:   PainSc: 5                  Ashari Llewellyn

## 2018-09-14 NOTE — Progress Notes (Signed)
PROGRESS NOTE                                                                                                                                                                                                             Patient Demographics:    Kristopher Simon, is a 53 y.o. male, DOB - 15-Mar-1966, DHR:[id number]  Admit date - 09/12/2018   Admitting Physician Shela Leff, MD  Outpatient Primary MD for the patient is [place], Sarah T, MD  LOS - 2  Chief Complaint: Left foot/ankle pain and swelling  Brief Narrative  Kristopher Simon is a 53 year old male with past medical history of obesity, uncontrolled type 2 diabetes mellitus, stroke, CAD, hypertension, dyslipidemia, Charcot foot deformity on the left followed by podiatrist Dr. Cannon Kettle with multiple surgeries, last surgery on the left foot was in June 2018.  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 Cannon Kettle, this was being treated with local wound care, medahoney etc. for the past 4 to 5 days patient had increased pain and swelling in his lower calf, ankle region, around the same time he had a dental issue which he needed to sort out, he denies any subjective fevers or chills, yesterday due to worsening pain and swelling, he was sent here from [facility] for further evaluation and management of this problem.   Subjective:   Patient in bed, appears comfortable, denies any headache, no fever, no chest pain or pressure, no shortness of breath , no abdominal pain. No focal weakness.    Assessment  & Plan :     1.  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  Failed outpatient treatment and multiple surgeries, for now on empiric IV antibiotics, seen by Dr. Sharol Given will require amputation on 09/14/2018.  Discussed the case with him.  May require placement after that.  Type screen done, PRN IV Lopressor hydralazine added.  2.  History of CAD  with stent.  On aspirin-Plavix, statin and beta-blocker.  Chest pain-free, EKG & echo stable.  3.  History of CVA.  Aspirin-Plavix and statin to be continued for secondary prevention.  4.  Morbid obesity.  Follow with PCP for weight loss.  5.  Chronic pain.  Supportive care.  6.  DM type II.  Currently on Levemir, meal NovoLog and sliding scale, I have lowered the dose as he will be n.p.o. after midnight.  He was elevated this  a.m. as we had reduced his insulin prior to surgery and he was on gentle D5 drip, stop D5 drip, insulin coverage given.  Postop will make his insulin regimen more strict for tighter control, trying to avoid hypoglycemia in the OR.  Lab Results  Component Value Date   HGBA1C 10.1 (H) 09/13/2018   CBG (last 3)  Recent Labs    09/13/18 2141 09/14/18 0640 09/14/18 0800  GLUCAP 249* 373* 376*     Family Communication  :  None  Code Status :  Full  Disposition Plan  :  TBD  Consults  :  Dr Sharol Given  Procedures  :    TTE -   - Left ventricle: The cavity size was normal. There was moderate concentric hypertrophy. Systolic function was normal. The   estimated ejection fraction was in the range of 50% to 55%. Wall  motion was normal; there were no regional wall motion   abnormalities. The study is indeterminate for the evaluation of LV diastolic function. - Aortic valve: Trileaflet; mildly thickened, mildly calcified  leaflets. There was no regurgitation. - Mitral valve: Calcified annulus. There was trivial regurgitation. - Right ventricle: Systolic function was normal. - Atrial septum: No defect or patent foramen ovale was identified.  DVT Prophylaxis  :  Lovenox    Lab Results  Component Value Date   PLT 403 (H) 09/14/2018    Diet :  Diet Order            Diet NPO time specified Except for: Sips with Meds  Diet effective midnight               Inpatient Medications Scheduled Meds: . [MAR Hold] aspirin EC  81 mg Oral Daily  . [MAR Hold]  clopidogrel  75 mg Oral Daily  . [MAR Hold] enoxaparin (LOVENOX) injection  0.5 mg/kg Subcutaneous Q24H  . [MAR Hold] furosemide  20 mg Oral Q0600  . [MAR Hold] gabapentin  300 mg Oral TID  . [MAR Hold] hydrALAZINE  25 mg Oral Q8H  . [MAR Hold] insulin aspart  0-15 Units Subcutaneous TID WC  . [MAR Hold] insulin aspart  0-5 Units Subcutaneous QHS  . [MAR Hold] insulin aspart  20 Units Subcutaneous TID WC  . [MAR Hold] insulin detemir  60 Units Subcutaneous BID  . [MAR Hold] metoprolol tartrate  50 mg Oral BID  . [MAR Hold] pantoprazole  40 mg Oral Q2200  . [MAR Hold] saccharomyces boulardii  250 mg Oral BID   Continuous Infusions: .  ceFAZolin (ANCEF) IV    . dextrose 75 mL/hr at 09/13/18 2351  . [MAR Hold] piperacillin-tazobactam (ZOSYN)  IV 3.375 g (09/14/18 0157)  . [MAR Hold] vancomycin 2,000 mg (09/14/18 0615)   PRN Meds:.[MAR Hold] acetaminophen, [MAR Hold] hydrALAZINE, [MAR Hold] HYDROmorphone, [MAR Hold] hydrOXYzine, [MAR Hold] metoprolol tartrate, [MAR Hold]  morphine injection, [MAR Hold] ondansetron **OR** [MAR Hold] ondansetron (ZOFRAN) IV  Antibiotics  :   Anti-infectives (From admission, onward)   Start     Dose/Rate Route Frequency Ordered Stop   09/14/18 0830  ceFAZolin (ANCEF) 3 g in dextrose 5 % 50 mL IVPB     3 g 100 mL/hr over 30 Minutes Intravenous To ShortStay Surgical 09/13/18 1427 09/15/18 0830   09/13/18 0600  [MAR Hold]  vancomycin (VANCOCIN) 2,000 mg in sodium chloride 0.9 % 500 mL IVPB     (MAR Hold since Fri 09/14/2018 at 0832. Reason: Transfer to a Procedural area.)   2,000 mg 250  mL/hr over 120 Minutes Intravenous Every 12 hours 09/12/18 1703     09/12/18 1800  [MAR Hold]  piperacillin-tazobactam (ZOSYN) IVPB 3.375 g     (MAR Hold since Fri 09/14/2018 at 0832. Reason: Transfer to a Procedural area.)   3.375 g 12.5 mL/hr over 240 Minutes Intravenous Every 8 hours 09/12/18 1703     09/12/18 1715  vancomycin (VANCOCIN) 2,500 mg in sodium chloride 0.9 % 500  mL IVPB     2,500 mg 250 mL/hr over 120 Minutes Intravenous NOW 09/12/18 1647 09/13/18 0120          Objective:   Vitals:   09/13/18 2142 09/14/18 0641 09/14/18 0807 09/14/18 0852  BP: (!) 160/68 (!) 160/71 (!) 149/84   Pulse: (!) 103 97 91   Resp:      Temp: 99.2 F (37.3 C) 98.7 F (37.1 C)    TempSrc: Oral Oral    SpO2: 99% 98%    Weight:    (!) 178.4 kg  Height:    6\' 5"  (1.956 m)    Wt Readings from Last 3 Encounters:  09/14/18 (!) 178.4 kg  07/11/14 (!) 169.8 kg  02/08/12 (!) 161.1 kg     Intake/Output Summary (Last 24 hours) at 09/14/2018 0910 Last data filed at 09/14/2018 0654 Gross per 24 hour  Intake 105.17 ml  Output 2900 ml  Net -2794.83 ml     Physical Exam  Awake Alert, Oriented X 3, No new F.N deficits, Normal affect [place].AT,PERRAL Supple Neck,No JVD, No cervical lymphadenopathy appriciated.  Symmetrical Chest wall movement, Good air movement bilaterally, CTAB RRR,No Gallops, Rubs or new Murmurs, No Parasternal Heave +ve B.Sounds, Abd Soft, No tenderness, No organomegaly appriciated, No rebound - guarding or rigidity. No Cyanosis, Clubbing or edema, No new Rash or bruise, left foot Charcot joint     Data Review:    CBC Recent Labs  Lab 09/12/18 1938 09/13/18 0502 09/14/18 0512  WBC 10.8* 10.5 9.2  HGB 11.2* 11.1* 11.1*  HCT 35.4* 34.9* 36.3*  PLT 449* 435* 403*  MCV 81.4 82.5 82.7  MCH 25.7* 26.2 25.3*  MCHC 31.6 31.8 30.6  RDW 15.9* 16.1* 16.2*    Chemistries  Recent Labs  Lab 09/12/18 1938 09/13/18 0502 09/14/18 0512  NA  --  135 136  K  --  3.3* 3.9  CL  --  100 101  CO2  --  25 26  GLUCOSE  --  168* 395*  BUN  --  9 8  CREATININE 0.99 0.91 1.16  CALCIUM  --  8.7* 8.7*  MG  --   --  1.9   ------------------------------------------------------------------------------------------------------------------ No results for input(s): CHOL, HDL, LDLCALC, TRIG, CHOLHDL, LDLDIRECT in the last 72 hours.  Lab Results    Component Value Date   HGBA1C 10.1 (H) 09/13/2018   ------------------------------------------------------------------------------------------------------------------ No results for input(s): TSH, T4TOTAL, T3FREE, THYROIDAB in the last 72 hours.  Invalid input(s): FREET3 ------------------------------------------------------------------------------------------------------------------ No results for input(s): VITAMINB12, FOLATE, FERRITIN, TIBC, IRON, RETICCTPCT in the last 72 hours.  Coagulation profile No results for input(s): INR, PROTIME in the last 168 hours.  No results for input(s): DDIMER in the last 72 hours.  Cardiac Enzymes No results for input(s): CKMB, TROPONINI, MYOGLOBIN in the last 168 hours.  Invalid input(s): CK ------------------------------------------------------------------------------------------------------------------ No results found for: BNP  Micro Results Recent Results (from the past 240 hour(s))  Surgical pcr screen     Status: None   Collection Time: 09/13/18  5:50 PM  Result Value  Ref Range Status   MRSA, PCR NEGATIVE NEGATIVE Final   Staphylococcus aureus NEGATIVE NEGATIVE Final    Comment: (NOTE) The Xpert SA Assay (FDA approved for NASAL specimens in patients 61 years of age and older), is one component of a comprehensive surveillance program. It is not intended to diagnose infection nor to guide or monitor treatment. Performed at [facility], [place] [address] [place], [place] [address]     Radiology Reports No results found.  Time Spent in minutes  30   Lala Lund M.D on 09/14/2018 at 9:10 AM  To page go to [email or web address] - password [facility]

## 2018-09-14 NOTE — Anesthesia Preprocedure Evaluation (Addendum)
Anesthesia Evaluation  Patient identified by MRN, date of birth, ID band Patient awake    Reviewed: Allergy & Precautions, H&P , NPO status , Patient's Chart, lab work & pertinent test results, reviewed documented beta blocker date and time   Airway Mallampati: II  TM Distance: >3 FB Neck ROM: full    Dental no notable dental hx.    Pulmonary shortness of breath,    Pulmonary exam normal breath sounds clear to auscultation       Cardiovascular Exercise Tolerance: Good hypertension, Pt. on medications and Pt. on home beta blockers + CAD   Rhythm:regular Rate:Normal  ECHO 19  - Normal LV EF, no significant valve disease.   Neuro/Psych Anxiety  Neuromuscular disease negative psych ROS   GI/Hepatic Neg liver ROS, GERD  ,  Endo/Other  diabetes, Type 2, Insulin DependentMorbid obesity  Renal/GU Renal disease  negative genitourinary   Musculoskeletal  (+) Arthritis , Fibromyalgia -, narcotic dependent  Abdominal   Peds  Hematology negative hematology ROS (+)   Anesthesia Other Findings   Reproductive/Obstetrics negative OB ROS                             Anesthesia Physical Anesthesia Plan  ASA: III  Anesthesia Plan: General   Post-op Pain Management: GA combined w/ Regional for post-op pain   Induction: Intravenous  PONV Risk Score and Plan: 2 and Treatment may vary due to age or medical condition and Ondansetron  Airway Management Planned: LMA and Oral ETT  Additional Equipment:   Intra-op Plan:   Post-operative Plan:   Informed Consent: I have reviewed the patients History and Physical, chart, labs and discussed the procedure including the risks, benefits and alternatives for the proposed anesthesia with the patient or authorized representative who has indicated his/her understanding and acceptance.     Dental Advisory Given  Plan Discussed with: CRNA, Anesthesiologist  and Surgeon  Anesthesia Plan Comments: ( )       Anesthesia Quick Evaluation

## 2018-09-14 NOTE — Anesthesia Procedure Notes (Signed)
Procedure Name: Intubation Date/Time: 09/14/2018 9:19 AM Performed by: Gwyndolyn Saxon, CRNA Pre-anesthesia Checklist: Patient identified, Emergency Drugs available, Suction available and Patient being monitored Patient Re-evaluated:Patient Re-evaluated prior to induction Oxygen Delivery Method: Circle system utilized Preoxygenation: Pre-oxygenation with 100% oxygen Induction Type: IV induction Ventilation: Mask ventilation without difficulty and Oral airway inserted - appropriate to patient size Laryngoscope Size: Mac and 3 Grade View: Grade II Tube type: Oral Tube size: 7.5 mm Number of attempts: 1 Airway Equipment and Method: Patient positioned with wedge pillow and Stylet Placement Confirmation: ETT inserted through vocal cords under direct vision,  positive ETCO2 and breath sounds checked- equal and bilateral Secured at: 25 cm Tube secured with: Tape Dental Injury: Teeth and Oropharynx as per pre-operative assessment

## 2018-09-15 ENCOUNTER — Encounter (HOSPITAL_COMMUNITY): Payer: Self-pay | Admitting: Orthopedic Surgery

## 2018-09-15 LAB — GLUCOSE, CAPILLARY
GLUCOSE-CAPILLARY: 239 mg/dL — AB (ref 70–99)
Glucose-Capillary: 246 mg/dL — ABNORMAL HIGH (ref 70–99)
Glucose-Capillary: 243 mg/dL — ABNORMAL HIGH (ref 70–99)
Glucose-Capillary: 260 mg/dL — ABNORMAL HIGH (ref 70–99)

## 2018-09-15 LAB — CBC
HCT: 33.9 % — ABNORMAL LOW (ref 39.0–52.0)
HEMOGLOBIN: 9.8 g/dL — AB (ref 13.0–17.0)
MCH: 24.6 pg — AB (ref 26.0–34.0)
MCHC: 28.9 g/dL — ABNORMAL LOW (ref 30.0–36.0)
MCV: 85 fL (ref 80.0–100.0)
Platelets: 417 10*3/uL — ABNORMAL HIGH (ref 150–400)
RBC: 3.99 MIL/uL — ABNORMAL LOW (ref 4.22–5.81)
RDW: 16.3 % — ABNORMAL HIGH (ref 11.5–15.5)
WBC: 11 10*3/uL — ABNORMAL HIGH (ref 4.0–10.5)
nRBC: 0 % (ref 0.0–0.2)

## 2018-09-15 MED ORDER — POLYETHYLENE GLYCOL 3350 17 G PO PACK
17.0000 g | PACK | Freq: Every day | ORAL | Status: DC
  Administered 2018-09-15 – 2018-09-17 (×3): 17 g via ORAL
  Filled 2018-09-15 (×3): qty 1

## 2018-09-15 MED ORDER — HYDROMORPHONE HCL 1 MG/ML IJ SOLN
1.0000 mg | INTRAMUSCULAR | Status: DC | PRN
  Administered 2018-09-15 – 2018-09-16 (×4): 1 mg via INTRAVENOUS
  Filled 2018-09-15 (×4): qty 1

## 2018-09-15 MED ORDER — MORPHINE SULFATE ER 60 MG PO TBCR
60.0000 mg | EXTENDED_RELEASE_TABLET | Freq: Two times a day (BID) | ORAL | Status: DC
  Administered 2018-09-15 – 2018-09-17 (×5): 60 mg via ORAL
  Filled 2018-09-15: qty 1
  Filled 2018-09-15: qty 4
  Filled 2018-09-15 (×3): qty 1

## 2018-09-15 MED ORDER — BISACODYL 5 MG PO TBEC: 10.0000 mg | DELAYED_RELEASE_TABLET | Freq: Every day | ORAL | Status: DC | PRN

## 2018-09-15 NOTE — Progress Notes (Signed)
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 PMH including but not limited to CAD, HTN, DM, stroke and anxiety.   Clinical Impression   PTA, pt lived at home with his Mom and was modified independent with ADL and mobility @ RW level. Pt currently requires Mod A with ADL. Pt is an excellent CIR candidate to facilitate return to modified independence and safe DC home with family. Will follow acutely.     Follow Up Recommendations  CIR;Supervision - Intermittent    Equipment Recommendations  3 in 1 bedside commode;Wheelchair (measurements OT);Wheelchair cushion (measurements OT)(wide 3in1)    Recommendations for Other Services Rehab consult     Precautions / Restrictions Precautions Precautions: Fall Restrictions Weight Bearing Restrictions: Yes LLE Weight Bearing: Non weight bearing      Mobility Bed Mobility Overal bed mobility: Needs Assistance Bed Mobility: Supine to Sit     Supine to sit: Min guard Sit to supine: Min guard   General bed mobility comments: min guard to come to EOB - verbal cueing for sequencing  Transfers - not attempted    Balance Overall balance assessment: Needs assistance Sitting-balance support: Single extremity supported;Feet supported Sitting balance-Leahy Scale: Good Sitting balance - Comments: pt able to sit EOB with supervision                              ADL either performed or assessed with clinical judgement   ADL Overall ADL's : Needs assistance/impaired     Grooming: Set up;Sitting   Upper Body Bathing: Set up;Sitting   Lower Body Bathing: Moderate assistance;Bed level   Upper Body Dressing : Set up;Sitting   Lower Body Dressing: Moderate assistance;Bed level               Functional mobility during ADLs: +2  for safety/equipment General ADL Comments: Began educating pt on compensatory techniques. Pt abl eto bridge and push through RLE while maintaining NWB status on LLE     Vision         Perception     Praxis      Pertinent Vitals/Pain Pain Assessment: Faces Faces Pain Scale: Hurts a little bit Pain Location: L residual limb Pain Descriptors / Indicators: Burning;Guarding Pain Intervention(s): Limited activity within patient's tolerance;Monitored during session;Repositioned     Hand Dominance     Extremity/Trunk Assessment Upper Extremity Assessment Upper Extremity Assessment: Overall WFL for tasks assessed   Lower Extremity Assessment Lower Extremity Assessment: Defer to PT evaluation LLE Deficits / Details: wound VAC in place at distal aspect, pt with good knee ROM and able to achieve full knee extension LLE Sensation: decreased light touch   Cervical / Trunk Assessment Cervical / Trunk Assessment: Normal   Communication Communication Communication: No difficulties   Cognition Arousal/Alertness: Awake/alert Behavior During Therapy: WFL for tasks assessed/performed Overall Cognitive Status: History of cognitive impairments - at baseline                                 General Comments: Hx of TBI   General Comments  Educated on desensitization activities    Exercises Exercises: Amputee(Simultaneous filing. User may not have seen previous data.)   Shoulder Instructions      Home  Living Family/patient expects to be discharged to:: Private residence Living Arrangements: Parent Available Help at Discharge: Family;Available 24 hours/day Type of Home: House Home Access: Ramped entrance     Home Layout: One level     Bathroom Shower/Tub: Teacher, early years/pre: Standard Bathroom Accessibility: No   Home Equipment: Environmental consultant - 2 wheels          Prior Functioning/Environment Level of Independence: Independent with assistive device(s)         Comments: ambulates with RW        OT Problem List: Decreased strength;Decreased range of motion;Decreased activity tolerance;Impaired balance (sitting and/or standing);Decreased knowledge of use of DME or AE;Decreased knowledge of precautions;Obesity;Pain      OT Treatment/Interventions: Self-care/ADL training;DME and/or AE instruction;Therapeutic activities;Patient/family education;Balance training    OT Goals(Current goals can be found in the care plan section) Acute Rehab OT Goals Patient Stated Goal: to get a prosthesis OT Goal Formulation: With patient Time For Goal Achievement: 09/29/18 Potential to Achieve Goals: Good  OT Frequency: Min 3X/week   Barriers to D/C:            Co-evaluation              AM-PAC OT "6 Clicks" Daily Activity     Outcome Measure Help from another person eating meals?: None Help from another person taking care of personal grooming?: None Help from another person toileting, which includes using toliet, bedpan, or urinal?: A Little Help from another person bathing (including washing, rinsing, drying)?: A Lot Help from another person to put on and taking off regular upper body clothing?: A Little Help from another person to put on and taking off regular lower body clothing?: A Lot 6 Click Score: 18   End of Session Nurse Communication: Mobility status  Activity Tolerance: Patient tolerated treatment well Patient left: in bed;with call bell/phone within reach  OT Visit Diagnosis: Other abnormalities of gait and mobility (R26.89);Muscle weakness (generalized) (M62.81);Pain Pain - Right/Left: Left Pain - part of body: Leg                Time: 1240-1310 OT Time Calculation (min): 30 min Charges:  OT General Charges $OT Visit: 1 Visit OT Evaluation $OT Eval Moderate Complexity: 1 Mod OT Treatments $Self Care/Home Management : 8-22 mins  Maurie Boettcher, OT/L   Acute OT Clinical Specialist [place] Pager  [phone] Office [phone]   [facility] 09/15/2018, 1:47 PM

## 2018-09-15 NOTE — Progress Notes (Signed)
Pt stable Pain ok Dressing and vac intact

## 2018-09-15 NOTE — Progress Notes (Signed)
PROGRESS NOTE                                                                                                                                                                                                             Patient Demographics:    Kristopher Simon, is a 53 y.o. male, DOB - July 06, 1966, TGG:[id number]  Admit date - 09/12/2018   Admitting Physician Shela Leff, MD  Outpatient Primary MD for the patient is [place], Sarah T, MD  LOS - 3  Chief Complaint: Left foot/ankle pain and swelling  Brief Narrative  Kristopher Simon is a 53 year old male with past medical history of obesity, uncontrolled type 2 diabetes mellitus, stroke, CAD, hypertension, dyslipidemia, Charcot foot deformity on the left followed by podiatrist Dr. Cannon Kettle with multiple surgeries, last surgery on the left foot was in June 2018.  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 Cannon Kettle, this was being treated with local wound care, medahoney etc. for the past 4 to 5 days patient had increased pain and swelling in his lower calf, ankle region, around the same time he had a dental issue which he needed to sort out, he denies any subjective fevers or chills, yesterday due to worsening pain and swelling, he was sent here from [facility] ER for further evaluation and management of this problem.   Subjective:   Patient in bed, appears comfortable, denies any headache, no fever, no chest pain or pressure, no shortness of breath , no abdominal pain. No focal weakness. Mild L stump pain.   Assessment  & Plan :     1.  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  ~Outpatient conservative management with antibiotics and multiple surgeries, finally orthopedics Dr. 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  Antibiotics will be discontinued.  Pain regimen has been adjusted on 09/15/2018.   Will monitor.  Will require placement most likely.  2.  History of CAD with stent.  On aspirin-Plavix, statin and beta-blocker.  Chest pain-free, EKG & echo stable.  3.  History of CVA.  Aspirin-Plavix and statin to be continued for secondary prevention.  4.  Morbid obesity.  Follow with PCP for weight loss.  5.  Chronic pain.  Supportive care.  Has a #1 above.  6.  DM type II.  Has been placed on Levemir and sliding scale monitor and adjust.  Lab Results  Component Value Date   HGBA1C 10.1 (H) 09/13/2018   CBG (last 3)  Recent Labs    09/14/18 1654 09/14/18 2132 09/15/18 0804  GLUCAP 189* 140* 31*     Family Communication  :  None  Code Status :  Full  Disposition Plan  :  TBD  Consults  :  Dr Sharol Given  Procedures  :    L- BKA by DR Sharol Given 09/14/18   TTE -   - Left ventricle: The cavity size was normal. There was moderate concentric hypertrophy. Systolic function was normal. The   estimated ejection fraction was in the range of 50% to 55%. Wall  motion was normal; there were no regional wall motion   abnormalities. The study is indeterminate for the evaluation of LV diastolic function. - Aortic valve: Trileaflet; mildly thickened, mildly calcified  leaflets. There was no regurgitation. - Mitral valve: Calcified annulus. There was trivial regurgitation. - Right ventricle: Systolic function was normal. - Atrial septum: No defect or patent foramen ovale was identified.  DVT Prophylaxis  :  Lovenox    Lab Results  Component Value Date   PLT 417 (H) 09/15/2018    Diet :  Diet Order            Diet Carb Modified Fluid consistency: Thin; Room service appropriate? Yes  Diet effective now               Inpatient Medications Scheduled Meds: . aspirin EC  81 mg Oral Daily  . clopidogrel  75 mg Oral Daily  . docusate sodium  100 mg Oral BID  . enoxaparin (LOVENOX) injection  0.5 mg/kg Subcutaneous Q24H  . furosemide  20 mg Oral Q0600  . gabapentin  300 mg Oral  TID  . hydrALAZINE  25 mg Oral Q8H  . insulin aspart  0-15 Units Subcutaneous TID WC  . insulin aspart  0-5 Units Subcutaneous QHS  . insulin aspart  20 Units Subcutaneous TID WC  . insulin detemir  60 Units Subcutaneous BID  . metoprolol tartrate  50 mg Oral BID  . pantoprazole  40 mg Oral Q2200  . saccharomyces boulardii  250 mg Oral BID   Continuous Infusions: . sodium chloride 10 mL/hr at 09/14/18 1353  . methocarbamol (ROBAXIN) IV    . piperacillin-tazobactam (ZOSYN)  IV 3.375 g (09/15/18 0414)  . vancomycin 2,000 mg (09/15/18 0528)   PRN Meds:.acetaminophen, bisacodyl, hydrALAZINE, HYDROmorphone (DILAUDID) injection, HYDROmorphone, hydrOXYzine, magnesium citrate, methocarbamol **OR** methocarbamol (ROBAXIN) IV, metoCLOPramide **OR** metoCLOPramide (REGLAN) injection, metoprolol tartrate, morphine injection, ondansetron **OR** ondansetron (ZOFRAN) IV, ondansetron **OR** ondansetron (ZOFRAN) IV, oxyCODONE, oxyCODONE, polyethylene glycol  Antibiotics  :   Anti-infectives (From admission, onward)   Start     Dose/Rate Route Frequency Ordered Stop   09/14/18 0830  ceFAZolin (ANCEF) 3 g in dextrose 5 % 50 mL IVPB     3 g 100 mL/hr over 30 Minutes Intravenous To [facility] 09/13/18 1427 09/14/18 0923   09/13/18 0600  vancomycin (VANCOCIN) 2,000 mg in sodium chloride 0.9 % 500 mL IVPB     2,000 mg 250 mL/hr over 120 Minutes Intravenous Every 12 hours 09/12/18 1703     09/12/18 1800  piperacillin-tazobactam (ZOSYN) IVPB 3.375 g     3.375 g 12.5 mL/hr over 240 Minutes Intravenous Every 8 hours 09/12/18 1703     09/12/18 1715  vancomycin (VANCOCIN) 2,500 mg in sodium chloride 0.9 % 500 mL IVPB     2,500 mg 250  mL/hr over 120 Minutes Intravenous NOW 09/12/18 1647 09/13/18 0120          Objective:   Vitals:   09/14/18 1507 09/14/18 2004 09/14/18 2346 09/15/18 0408  BP: 125/70 (!) 108/59 (!) 114/57 134/70  Pulse:  93 86 90  Resp: 16 16 16 12   Temp:  98.4 F (36.9 C) 98.5  F (36.9 C) 99 F (37.2 C)  TempSrc:  Oral Oral Oral  SpO2: 95% 93% 97% 96%  Weight:      Height:        Wt Readings from Last 3 Encounters:  09/14/18 (!) 178.4 kg  07/11/14 (!) 169.8 kg  02/08/12 (!) 161.1 kg     Intake/Output Summary (Last 24 hours) at 09/15/2018 0942 Last data filed at 09/15/2018 0853 Gross per 24 hour  Intake 3510.31 ml  Output 1810 ml  Net 1700.31 ml     Physical Exam  Awake Alert, Oriented X 3, No new F.N deficits, Normal affect [place].AT,PERRAL Supple Neck,No JVD, No cervical lymphadenopathy appriciated.  Symmetrical Chest wall movement, Good air movement bilaterally, CTAB RRR,No Gallops, Rubs or new Murmurs, No Parasternal Heave +ve B.Sounds, Abd Soft, No tenderness, No organomegaly appriciated, No rebound - guarding or rigidity. No Cyanosis, Clubbing or edema, L.BKA with W Vac     Data Review:    CBC Recent Labs  Lab 09/12/18 1938 09/13/18 0502 09/14/18 0512 09/15/18 0326  WBC 10.8* 10.5 9.2 11.0*  HGB 11.2* 11.1* 11.1* 9.8*  HCT 35.4* 34.9* 36.3* 33.9*  PLT 449* 435* 403* 417*  MCV 81.4 82.5 82.7 85.0  MCH 25.7* 26.2 25.3* 24.6*  MCHC 31.6 31.8 30.6 28.9*  RDW 15.9* 16.1* 16.2* 16.3*    Chemistries  Recent Labs  Lab 09/12/18 1938 09/13/18 0502 09/14/18 0512  NA  --  135 136  K  --  3.3* 3.9  CL  --  100 101  CO2  --  25 26  GLUCOSE  --  168* 395*  BUN  --  9 8  CREATININE 0.99 0.91 1.16  CALCIUM  --  8.7* 8.7*  MG  --   --  1.9   ------------------------------------------------------------------------------------------------------------------ No results for input(s): CHOL, HDL, LDLCALC, TRIG, CHOLHDL, LDLDIRECT in the last 72 hours.  Lab Results  Component Value Date   HGBA1C 10.1 (H) 09/13/2018   ------------------------------------------------------------------------------------------------------------------ No results for input(s): TSH, T4TOTAL, T3FREE, THYROIDAB in the last 72 hours.  Invalid input(s):  FREET3 ------------------------------------------------------------------------------------------------------------------ No results for input(s): VITAMINB12, FOLATE, FERRITIN, TIBC, IRON, RETICCTPCT in the last 72 hours.  Coagulation profile No results for input(s): INR, PROTIME in the last 168 hours.  No results for input(s): DDIMER in the last 72 hours.  Cardiac Enzymes No results for input(s): CKMB, TROPONINI, MYOGLOBIN in the last 168 hours.  Invalid input(s): CK ------------------------------------------------------------------------------------------------------------------ No results found for: BNP  Micro Results Recent Results (from the past 240 hour(s))  Surgical pcr screen     Status: None   Collection Time: 09/13/18  5:50 PM  Result Value Ref Range Status   MRSA, PCR NEGATIVE NEGATIVE Final   Staphylococcus aureus NEGATIVE NEGATIVE Final    Comment: (NOTE) The Xpert SA Assay (FDA approved for NASAL specimens in patients 4 years of age and older), is one component of a comprehensive surveillance program. It is not intended to diagnose infection nor to guide or monitor treatment. Performed at [facility], [place] [address], [place], [place] [address]     Radiology Reports No results found.  Time Spent in minutes  30   Lala Lund M.D on 09/15/2018 at 9:42 AM  To page go to [email or web address] - password [email or web address]

## 2018-09-15 NOTE — Progress Notes (Signed)
Physical Therapy Treatment Patient Details Name: Kristopher Simon MRN: [id number] DOB: [birth year] Today's Date: 09/15/2018    History of Present Illness Pt is a 53 y/o male s/p L transtibial amputation secondary to Charcot foot deformity. PMH including but not limited to CAD, HTN, DM, stroke and anxiety.    PT Comments    Patient seen for mobility progression. Patient utilizing bariatric RW for mobility with greater ease and safety this session. Bed mobility and transfers at [place] for safety and stability. Patient requiring consistent education and verbal cueing on hand placement for sequencing and overall efficiency. PT providing education on importance of knee extension to prevent contractures as well as active motion of L LE to maintain strength. Will continue to recommend CIR at discharge. PT to continue to follow.    Follow Up Recommendations  CIR     Equipment Recommendations  None recommended by PT    Recommendations for Other Services Rehab consult     Precautions / Restrictions Precautions Precautions: Fall Restrictions Weight Bearing Restrictions: Yes LLE Weight Bearing: Non weight bearing    Mobility  Bed Mobility Overal bed mobility: Needs Assistance Bed Mobility: Supine to Sit     Supine to sit: Min guard Sit to supine: Min guard   General bed mobility comments: min guard to come to EOB - verbal cueing for sequencing  Transfers Overall transfer level: Needs assistance Equipment used: Rolling walker (2 wheeled) Transfers: Sit to/from [facility] Sit to Stand: Min guard Stand pivot transfers: Min assist;+2 safety/equipment       General transfer comment: increased time and effort - education on hand placement and sequencing for safety and efficiency  Ambulation/Gait                 Stairs             Wheelchair Mobility    Modified Rankin (Stroke Patients Only)       Balance Overall balance assessment: Needs  assistance Sitting-balance support: Single extremity supported;Feet supported Sitting balance-Leahy Scale: Good Sitting balance - Comments: pt able to sit EOB with supervision   Standing balance support: Bilateral upper extremity supported Standing balance-Leahy Scale: Poor Standing balance comment: reliant on bilateral UEs on RW                            Cognition Arousal/Alertness: Awake/alert Behavior During Therapy: WFL for tasks assessed/performed Overall Cognitive Status: History of cognitive impairments - at baseline                                 General Comments: Hx of TBI      Exercises      General Comments General comments (skin integrity, edema, etc.): Educated on desensitization activities      Pertinent Vitals/Pain Pain Assessment: Faces Faces Pain Scale: Hurts a little bit Pain Location: L residual limb Pain Descriptors / Indicators: Burning;Guarding Pain Intervention(s): Limited activity within patient's tolerance;Monitored during session;Repositioned    Home Living Family/patient expects to be discharged to:: Private residence Living Arrangements: Parent Available Help at Discharge: Family;Available 24 hours/day Type of Home: House Home Access: Ramped entrance   Home Layout: One level Home Equipment: Walker - 2 wheels      Prior Function Level of Independence: Independent with assistive device(s)      Comments: ambulates with RW   PT Goals (current goals can  now be found in the care plan section) Acute Rehab PT Goals Patient Stated Goal: to get a prosthesis PT Goal Formulation: With patient/family Time For Goal Achievement: 09/28/18 Potential to Achieve Goals: Good Progress towards PT goals: Progressing toward goals    Frequency    Min 5X/week      PT Plan Current plan remains appropriate    Co-evaluation              AM-PAC PT "6 Clicks" Mobility   Outcome Measure  Help needed turning from your  back to your side while in a flat bed without using bedrails?: None Help needed moving from lying on your back to sitting on the side of a flat bed without using bedrails?: None Help needed moving to and from a bed to a chair (including a wheelchair)?: A Lot Help needed standing up from a chair using your arms (e.g., wheelchair or bedside chair)?: A Little Help needed to walk in hospital room?: Total Help needed climbing 3-5 steps with a railing? : Total 6 Click Score: 15    End of Session Equipment Utilized During Treatment: Gait belt Activity Tolerance: Patient tolerated treatment well Patient left: in chair;with call bell/phone within reach;with chair alarm set Nurse Communication: Mobility status PT Visit Diagnosis: Other abnormalities of gait and mobility (R26.89);Pain Pain - Right/Left: Left Pain - part of body: Leg     Time: 1315-1336 PT Time Calculation (min) (ACUTE ONLY): 21 min  Charges:  $Gait Training: 8-22 mins                     Lanney Gins, PT, DPT Supplemental Physical Therapist 09/15/18 1:49 PM Pager: [phone] Office: [phone]

## 2018-09-16 LAB — CBC
HCT: 35.6 % — ABNORMAL LOW (ref 39.0–52.0)
Hemoglobin: 10.8 g/dL — ABNORMAL LOW (ref 13.0–17.0)
MCH: 26 pg (ref 26.0–34.0)
MCHC: 30.3 g/dL (ref 30.0–36.0)
MCV: 85.8 fL (ref 80.0–100.0)
Platelets: 388 10*3/uL (ref 150–400)
RBC: 4.15 MIL/uL — AB (ref 4.22–5.81)
RDW: 16.3 % — ABNORMAL HIGH (ref 11.5–15.5)
WBC: 9.7 10*3/uL (ref 4.0–10.5)
nRBC: 0 % (ref 0.0–0.2)

## 2018-09-16 LAB — BASIC METABOLIC PANEL
Anion gap: 11 (ref 5–15)
BUN: 7 mg/dL (ref 6–20)
CO2: 27 mmol/L (ref 22–32)
Calcium: 8.9 mg/dL (ref 8.9–10.3)
Chloride: 98 mmol/L (ref 98–111)
Creatinine, Ser: 1.31 mg/dL — ABNORMAL HIGH (ref 0.61–1.24)
GFR calc Af Amer: >60 mL/min (ref >60)
GFR calc non Af Amer: >60 mL/min (ref >60)
GLUCOSE: 219 mg/dL — AB (ref 70–99)
POTASSIUM: 4.1 mmol/L (ref 3.5–5.1)
Sodium: 136 mmol/L (ref 135–145)

## 2018-09-16 LAB — GLUCOSE, CAPILLARY
Glucose-Capillary: 239 mg/dL — ABNORMAL HIGH (ref 70–99)
Glucose-Capillary: 253 mg/dL — ABNORMAL HIGH (ref 70–99)
Glucose-Capillary: 262 mg/dL — ABNORMAL HIGH (ref 70–99)

## 2018-09-16 MED ORDER — CLONAZEPAM 0.125 MG PO TBDP
0.5000 mg | ORAL_TABLET | Freq: Two times a day (BID) | ORAL | Status: DC
  Administered 2018-09-16 – 2018-09-17 (×3): 0.5 mg via ORAL
  Filled 2018-09-16 (×3): qty 4

## 2018-09-16 MED ORDER — INSULIN DETEMIR 100 UNIT/ML ~~LOC~~ SOLN
65.0000 [IU] | Freq: Two times a day (BID) | SUBCUTANEOUS | Status: DC
  Administered 2018-09-16 (×2): 65 [IU] via SUBCUTANEOUS
  Filled 2018-09-16 (×3): qty 0.65

## 2018-09-16 MED ORDER — BISACODYL 5 MG PO TBEC
10.0000 mg | DELAYED_RELEASE_TABLET | Freq: Every day | ORAL | Status: DC
  Administered 2018-09-16 – 2018-09-17 (×2): 10 mg via ORAL
  Filled 2018-09-16 (×2): qty 2

## 2018-09-16 NOTE — Progress Notes (Signed)
PROGRESS NOTE                                                                                                                                                                                                             Patient Demographics:    Kristopher Simon, is a 53 y.o. male, DOB - 31-Jan-1966, KWI:[id number]  Admit date - 09/12/2018   Admitting Physician Shela Leff, MD  Outpatient Primary MD for the patient is [place], Sarah T, MD  LOS - 4  Chief Complaint: Left foot/ankle pain and swelling  Brief Narrative  Kristopher Simon is a 53 year old male with past medical history of obesity, uncontrolled type 2 diabetes mellitus, stroke, CAD, hypertension, dyslipidemia, Charcot foot deformity on the left followed by podiatrist Dr. Cannon Kettle with multiple surgeries, last surgery on the left foot was in June 2018.  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 Cannon Kettle, this was being treated with local wound care, medahoney etc. for the past 4 to 5 days patient had increased pain and swelling in his lower calf, ankle region, around the same time he had a dental issue which he needed to sort out, he denies any subjective fevers or chills, yesterday due to worsening pain and swelling, he was sent here from [facility] ER for further evaluation and management of this problem.   Subjective:   Patient in bed, appears comfortable, denies any headache, no fever, no chest pain or pressure, no shortness of breath , no abdominal pain. No focal weakness. Mild L stump pain.   Assessment  & Plan :     1.  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 He failed outpatient conservative management with antibiotics and multiple surgeries, finally orthopedics Dr. 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  Antibiotics have been discontinued, since tolerating oral diet and in no  distress on appearance have transitioned him to oral pain regimen, advance activity may require placement, PT on board.  2.  History of CAD with stent.  On aspirin-Plavix, statin and beta-blocker.  Chest pain-free, EKG & echo stable.  3.  History of CVA.  Aspirin-Plavix and statin to be continued for secondary prevention.  4.  Morbid obesity.  Follow with PCP for weight loss.  5.  Chronic pain.  Supportive care.  Has a #1 above.  6.  Panic Attacks - Stable , chronic problem,  added Klonopin.  7. DM type II.  Is on Levemir, pre-meal NovoLog along with sliding scale dosage adjusted on 09/16/2018 for better control.  Lab Results  Component Value Date   HGBA1C 10.1 (H) 09/13/2018   CBG (last 3)  Recent Labs    09/15/18 1711 09/15/18 2121 09/16/18 0803  GLUCAP 260* 239* 239*     Family Communication  :  None  Code Status :  Full  Disposition Plan  :  TBD  Consults  :  Dr Sharol Given  Procedures  :    L- BKA by DR Sharol Given 09/14/18   TTE -   - Left ventricle: The cavity size was normal. There was moderate concentric hypertrophy. Systolic function was normal. The   estimated ejection fraction was in the range of 50% to 55%. Wall  motion was normal; there were no regional wall motion   abnormalities. The study is indeterminate for the evaluation of LV diastolic function. - Aortic valve: Trileaflet; mildly thickened, mildly calcified  leaflets. There was no regurgitation. - Mitral valve: Calcified annulus. There was trivial regurgitation. - Right ventricle: Systolic function was normal. - Atrial septum: No defect or patent foramen ovale was identified.  DVT Prophylaxis  :  Lovenox    Lab Results  Component Value Date   PLT 388 09/16/2018    Diet :  Diet Order            Diet Carb Modified Fluid consistency: Thin; Room service appropriate? Yes  Diet effective now               Inpatient Medications Scheduled Meds: . aspirin EC  81 mg Oral Daily  . bisacodyl  10 mg Oral  Daily  . clonazepam  0.5 mg Oral BID  . clopidogrel  75 mg Oral Daily  . docusate sodium  100 mg Oral BID  . enoxaparin (LOVENOX) injection  0.5 mg/kg Subcutaneous Q24H  . gabapentin  300 mg Oral TID  . hydrALAZINE  25 mg Oral Q8H  . insulin aspart  0-15 Units Subcutaneous TID WC  . insulin aspart  0-5 Units Subcutaneous QHS  . insulin aspart  20 Units Subcutaneous TID WC  . insulin detemir  65 Units Subcutaneous BID  . metoprolol tartrate  50 mg Oral BID  . morphine  60 mg Oral Q12H  . pantoprazole  40 mg Oral Q2200  . polyethylene glycol  17 g Oral Daily  . saccharomyces boulardii  250 mg Oral BID   Continuous Infusions: . sodium chloride 10 mL/hr at 09/14/18 1353   PRN Meds:.acetaminophen, hydrALAZINE, hydrOXYzine, magnesium citrate, methocarbamol **OR** [DISCONTINUED] methocarbamol (ROBAXIN) IV, metoprolol tartrate, [DISCONTINUED] ondansetron **OR** ondansetron (ZOFRAN) IV, oxyCODONE  Antibiotics  :   Anti-infectives (From admission, onward)   Start     Dose/Rate Route Frequency Ordered Stop   09/14/18 0830  ceFAZolin (ANCEF) 3 g in dextrose 5 % 50 mL IVPB     3 g 100 mL/hr over 30 Minutes Intravenous To ShortStay Surgical 09/13/18 1427 09/14/18 0923   09/13/18 0600  vancomycin (VANCOCIN) 2,000 mg in sodium chloride 0.9 % 500 mL IVPB  Status:  Discontinued     2,000 mg 250 mL/hr over 120 Minutes Intravenous Every 12 hours 09/12/18 1703 09/15/18 0947   09/12/18 1800  piperacillin-tazobactam (ZOSYN) IVPB 3.375 g  Status:  Discontinued     3.375 g 12.5 mL/hr over 240 Minutes Intravenous Every 8 hours 09/12/18 1703 09/15/18 0947   09/12/18 1715  vancomycin (VANCOCIN) 2,500 mg  in sodium chloride 0.9 % 500 mL IVPB     2,500 mg 250 mL/hr over 120 Minutes Intravenous NOW 09/12/18 1647 09/13/18 0120          Objective:   Vitals:   09/15/18 1230 09/15/18 1423 09/15/18 2018 09/16/18 0524  BP: 140/72 136/71 (!) 145/91 (!) 165/68  Pulse: 76 94 91 (!) 111  Resp:  18 18 16     Temp: 98.5 F (36.9 C) 98.4 F (36.9 C) 99.7 F (37.6 C) 100 F (37.8 C)  TempSrc: Oral Oral Oral Oral  SpO2: 98% 96% 94% 92%  Weight:      Height:        Wt Readings from Last 3 Encounters:  09/14/18 (!) 178.4 kg  07/11/14 (!) 169.8 kg  02/08/12 (!) 161.1 kg     Intake/Output Summary (Last 24 hours) at 09/16/2018 0903 Last data filed at 09/16/2018 0526 Gross per 24 hour  Intake -  Output 2425 ml  Net -2425 ml     Physical Exam  Awake Alert, Oriented X 3, No new F.N deficits, Normal affect [place].AT,PERRAL Supple Neck,No JVD, No cervical lymphadenopathy appriciated.  Symmetrical Chest wall movement, Good air movement bilaterally, CTAB RRR,No Gallops, Rubs or new Murmurs, No Parasternal Heave +ve B.Sounds, Abd Soft, No tenderness, No organomegaly appriciated, No rebound - guarding or rigidity. No Cyanosis, Clubbing or edema,  L.BKA with W Vac     Data Review:    CBC Recent Labs  Lab 09/12/18 1938 09/13/18 0502 09/14/18 0512 09/15/18 0326 09/16/18 0342  WBC 10.8* 10.5 9.2 11.0* 9.7  HGB 11.2* 11.1* 11.1* 9.8* 10.8*  HCT 35.4* 34.9* 36.3* 33.9* 35.6*  PLT 449* 435* 403* 417* 388  MCV 81.4 82.5 82.7 85.0 85.8  MCH 25.7* 26.2 25.3* 24.6* 26.0  MCHC 31.6 31.8 30.6 28.9* 30.3  RDW 15.9* 16.1* 16.2* 16.3* 16.3*    Chemistries  Recent Labs  Lab 09/12/18 1938 09/13/18 0502 09/14/18 0512 09/16/18 0342  NA  --  135 136 136  K  --  3.3* 3.9 4.1  CL  --  100 101 98  CO2  --  25 26 27   GLUCOSE  --  168* 395* 219*  BUN  --  9 8 7   CREATININE 0.99 0.91 1.16 1.31*  CALCIUM  --  8.7* 8.7* 8.9  MG  --   --  1.9  --    ------------------------------------------------------------------------------------------------------------------ No results for input(s): CHOL, HDL, LDLCALC, TRIG, CHOLHDL, LDLDIRECT in the last 72 hours.  Lab Results  Component Value Date   HGBA1C 10.1 (H) 09/13/2018    ------------------------------------------------------------------------------------------------------------------ No results for input(s): TSH, T4TOTAL, T3FREE, THYROIDAB in the last 72 hours.  Invalid input(s): FREET3 ------------------------------------------------------------------------------------------------------------------ No results for input(s): VITAMINB12, FOLATE, FERRITIN, TIBC, IRON, RETICCTPCT in the last 72 hours.  Coagulation profile No results for input(s): INR, PROTIME in the last 168 hours.  No results for input(s): DDIMER in the last 72 hours.  Cardiac Enzymes No results for input(s): CKMB, TROPONINI, MYOGLOBIN in the last 168 hours.  Invalid input(s): CK ------------------------------------------------------------------------------------------------------------------ No results found for: BNP  Micro Results Recent Results (from the past 240 hour(s))  Surgical pcr screen     Status: None   Collection Time: 09/13/18  5:50 PM  Result Value Ref Range Status   MRSA, PCR NEGATIVE NEGATIVE Final   Staphylococcus aureus NEGATIVE NEGATIVE Final    Comment: (NOTE) The Xpert SA Assay (FDA approved for NASAL specimens in patients 55 years of age and older), is  one component of a comprehensive surveillance program. It is not intended to diagnose infection nor to guide or monitor treatment. Performed at [facility], [place] [address], [place], [place] [address]     Radiology Reports No results found.  Time Spent in minutes  30   Lala Lund M.D on 09/16/2018 at 9:03 AM  To page go to [email or web address] - password [facility]

## 2018-09-17 ENCOUNTER — Inpatient Hospital Stay (HOSPITAL_COMMUNITY)
Admission: RE | Admit: 2018-09-17 | Discharge: 2018-09-26 | DRG: 560 | Disposition: A | Discharge status: Home / Self Care | Payer: Medicaid Other | Source: Intra-hospital | Attending: Physical Medicine & Rehabilitation | Admitting: Physical Medicine & Rehabilitation

## 2018-09-17 ENCOUNTER — Encounter (HOSPITAL_COMMUNITY): Payer: Self-pay

## 2018-09-17 ENCOUNTER — Other Ambulatory Visit: Payer: Self-pay

## 2018-09-17 DIAGNOSIS — E46 Unspecified protein-calorie malnutrition: Secondary | ICD-10-CM | POA: Diagnosis present

## 2018-09-17 DIAGNOSIS — D62 Acute posthemorrhagic anemia: Secondary | ICD-10-CM | POA: Diagnosis present

## 2018-09-17 DIAGNOSIS — I10 Essential (primary) hypertension: Secondary | ICD-10-CM | POA: Diagnosis not present

## 2018-09-17 DIAGNOSIS — F411 Generalized anxiety disorder: Secondary | ICD-10-CM | POA: Diagnosis present

## 2018-09-17 DIAGNOSIS — G8918 Other acute postprocedural pain: Secondary | ICD-10-CM

## 2018-09-17 DIAGNOSIS — Z794 Long term (current) use of insulin: Secondary | ICD-10-CM

## 2018-09-17 DIAGNOSIS — E669 Obesity, unspecified: Secondary | ICD-10-CM

## 2018-09-17 DIAGNOSIS — E11610 Type 2 diabetes mellitus with diabetic neuropathic arthropathy: Secondary | ICD-10-CM | POA: Diagnosis present

## 2018-09-17 DIAGNOSIS — Z8673 Personal history of transient ischemic attack (TIA), and cerebral infarction without residual deficits: Secondary | ICD-10-CM

## 2018-09-17 DIAGNOSIS — T50905A Adverse effect of unspecified drugs, medicaments and biological substances, initial encounter: Secondary | ICD-10-CM | POA: Diagnosis present

## 2018-09-17 DIAGNOSIS — Z7902 Long term (current) use of antithrombotics/antiplatelets: Secondary | ICD-10-CM | POA: Diagnosis not present

## 2018-09-17 DIAGNOSIS — F4001 Agoraphobia with panic disorder: Secondary | ICD-10-CM | POA: Diagnosis present

## 2018-09-17 DIAGNOSIS — S88111D Complete traumatic amputation at level between knee and ankle, right lower leg, subsequent encounter: Secondary | ICD-10-CM

## 2018-09-17 DIAGNOSIS — D72829 Elevated white blood cell count, unspecified: Secondary | ICD-10-CM | POA: Diagnosis present

## 2018-09-17 DIAGNOSIS — F4312 Post-traumatic stress disorder, chronic: Secondary | ICD-10-CM | POA: Diagnosis present

## 2018-09-17 DIAGNOSIS — E871 Hypo-osmolality and hyponatremia: Secondary | ICD-10-CM | POA: Diagnosis present

## 2018-09-17 DIAGNOSIS — E1140 Type 2 diabetes mellitus with diabetic neuropathy, unspecified: Secondary | ICD-10-CM | POA: Diagnosis present

## 2018-09-17 DIAGNOSIS — E119 Type 2 diabetes mellitus without complications: Secondary | ICD-10-CM | POA: Diagnosis not present

## 2018-09-17 DIAGNOSIS — G8929 Other chronic pain: Secondary | ICD-10-CM | POA: Diagnosis present

## 2018-09-17 DIAGNOSIS — Z833 Family history of diabetes mellitus: Secondary | ICD-10-CM | POA: Diagnosis not present

## 2018-09-17 DIAGNOSIS — K5900 Constipation, unspecified: Secondary | ICD-10-CM | POA: Diagnosis present

## 2018-09-17 DIAGNOSIS — Z4781 Encounter for orthopedic aftercare following surgical amputation: Principal | ICD-10-CM

## 2018-09-17 DIAGNOSIS — Z89512 Acquired absence of left leg below knee: Secondary | ICD-10-CM | POA: Diagnosis present

## 2018-09-17 DIAGNOSIS — S88111S Complete traumatic amputation at level between knee and ankle, right lower leg, sequela: Secondary | ICD-10-CM | POA: Diagnosis not present

## 2018-09-17 DIAGNOSIS — I2510 Atherosclerotic heart disease of native coronary artery without angina pectoris: Secondary | ICD-10-CM | POA: Diagnosis present

## 2018-09-17 DIAGNOSIS — E1169 Type 2 diabetes mellitus with other specified complication: Secondary | ICD-10-CM | POA: Diagnosis not present

## 2018-09-17 DIAGNOSIS — Z7982 Long term (current) use of aspirin: Secondary | ICD-10-CM | POA: Diagnosis not present

## 2018-09-17 DIAGNOSIS — Z79899 Other long term (current) drug therapy: Secondary | ICD-10-CM | POA: Diagnosis not present

## 2018-09-17 DIAGNOSIS — Z6841 Body Mass Index (BMI) 40.0 and over, adult: Secondary | ICD-10-CM

## 2018-09-17 DIAGNOSIS — Z23 Encounter for immunization: Secondary | ICD-10-CM

## 2018-09-17 DIAGNOSIS — K5903 Drug induced constipation: Secondary | ICD-10-CM | POA: Diagnosis present

## 2018-09-17 DIAGNOSIS — M797 Fibromyalgia: Secondary | ICD-10-CM | POA: Diagnosis present

## 2018-09-17 DIAGNOSIS — E1142 Type 2 diabetes mellitus with diabetic polyneuropathy: Secondary | ICD-10-CM | POA: Diagnosis present

## 2018-09-17 DIAGNOSIS — F40240 Claustrophobia: Secondary | ICD-10-CM | POA: Diagnosis present

## 2018-09-17 LAB — GLUCOSE, CAPILLARY
GLUCOSE-CAPILLARY: 284 mg/dL — AB (ref 70–99)
Glucose-Capillary: 249 mg/dL — ABNORMAL HIGH (ref 70–99)
Glucose-Capillary: 306 mg/dL — ABNORMAL HIGH (ref 70–99)
Glucose-Capillary: 309 mg/dL — ABNORMAL HIGH (ref 70–99)
Glucose-Capillary: 194 mg/dL — ABNORMAL HIGH (ref 70–99)

## 2018-09-17 LAB — BASIC METABOLIC PANEL
Anion gap: 11 (ref 5–15)
BUN: 9 mg/dL (ref 6–20)
CO2: 27 mmol/L (ref 22–32)
Calcium: 9 mg/dL (ref 8.9–10.3)
Chloride: 95 mmol/L — ABNORMAL LOW (ref 98–111)
Creatinine, Ser: 1.19 mg/dL (ref 0.61–1.24)
GFR calc Af Amer: >60 mL/min (ref >60)
GFR calc non Af Amer: >60 mL/min (ref >60)
Glucose, Bld: 276 mg/dL — ABNORMAL HIGH (ref 70–99)
Potassium: 3.9 mmol/L (ref 3.5–5.1)
Sodium: 133 mmol/L — ABNORMAL LOW (ref 135–145)

## 2018-09-17 MED ORDER — PRO-STAT SUGAR FREE PO LIQD
30.0000 mL | Freq: Two times a day (BID) | ORAL | Status: DC
  Administered 2018-09-17 – 2018-09-26 (×18): 30 mL via ORAL
  Filled 2018-09-17 (×18): qty 30

## 2018-09-17 MED ORDER — HYDRALAZINE HCL 25 MG PO TABS
25.0000 mg | ORAL_TABLET | Freq: Three times a day (TID) | ORAL | Status: DC
  Administered 2018-09-17 – 2018-09-26 (×26): 25 mg via ORAL
  Filled 2018-09-17 (×28): qty 1

## 2018-09-17 MED ORDER — INSULIN GLARGINE 100 UNIT/ML ~~LOC~~ SOLN
72.0000 [IU] | Freq: Two times a day (BID) | SUBCUTANEOUS | Status: DC
  Administered 2018-09-17 – 2018-09-19 (×4): 72 [IU] via SUBCUTANEOUS
  Filled 2018-09-17 (×7): qty 0.72

## 2018-09-17 MED ORDER — INSULIN ASPART 100 UNIT/ML ~~LOC~~ SOLN
20.0000 [IU] | Freq: Three times a day (TID) | SUBCUTANEOUS | Status: DC
  Administered 2018-09-17 – 2018-09-25 (×15): 20 [IU] via SUBCUTANEOUS

## 2018-09-17 MED ORDER — GABAPENTIN 300 MG PO CAPS
300.0000 mg | ORAL_CAPSULE | Freq: Three times a day (TID) | ORAL | Status: DC
  Administered 2018-09-17 – 2018-09-26 (×28): 300 mg via ORAL
  Filled 2018-09-17 (×27): qty 1

## 2018-09-17 MED ORDER — OXYCODONE HCL 5 MG PO TABS: 5.0000 mg | ORAL_TABLET | ORAL | 0 refills | Status: DC | PRN

## 2018-09-17 MED ORDER — PROCHLORPERAZINE MALEATE 5 MG PO TABS: 5.0000 mg | ORAL_TABLET | Freq: Four times a day (QID) | ORAL | Status: DC | PRN

## 2018-09-17 MED ORDER — POLYETHYLENE GLYCOL 3350 17 G PO PACK
17.0000 g | PACK | Freq: Two times a day (BID) | ORAL | Status: DC
  Administered 2018-09-17 – 2018-09-24 (×10): 17 g via ORAL
  Filled 2018-09-17 (×17): qty 1

## 2018-09-17 MED ORDER — INSULIN DETEMIR 100 UNIT/ML ~~LOC~~ SOLN: 75.0000 [IU] | Freq: Two times a day (BID) | SUBCUTANEOUS | 11 refills | Status: DC

## 2018-09-17 MED ORDER — CLOPIDOGREL BISULFATE 75 MG PO TABS
75.0000 mg | ORAL_TABLET | Freq: Every day | ORAL | Status: DC
  Administered 2018-09-18 – 2018-09-26 (×9): 75 mg via ORAL
  Filled 2018-09-17 (×9): qty 1

## 2018-09-17 MED ORDER — BISACODYL 5 MG PO TBEC
10.0000 mg | DELAYED_RELEASE_TABLET | Freq: Once | ORAL | Status: AC
  Administered 2018-09-17: 10 mg via ORAL
  Filled 2018-09-17: qty 2

## 2018-09-17 MED ORDER — MAGNESIUM HYDROXIDE 400 MG/5ML PO SUSP
30.0000 mL | Freq: Two times a day (BID) | ORAL | Status: DC
  Administered 2018-09-17: 30 mL via ORAL
  Filled 2018-09-17: qty 30

## 2018-09-17 MED ORDER — POLYETHYLENE GLYCOL 3350 17 G PO PACK: 17.0000 g | PACK | Freq: Two times a day (BID) | ORAL | 0 refills | Status: DC

## 2018-09-17 MED ORDER — INFLUENZA VAC SPLIT HIGH-DOSE 0.5 ML IM SUSY: 0.5000 mL | PREFILLED_SYRINGE | INTRAMUSCULAR | Status: DC

## 2018-09-17 MED ORDER — INSULIN ASPART 100 UNIT/ML ~~LOC~~ SOLN: 0.0000 [IU] | Freq: Every day | SUBCUTANEOUS | Status: DC

## 2018-09-17 MED ORDER — POLYETHYLENE GLYCOL 3350 17 G PO PACK: 17.0000 g | PACK | Freq: Two times a day (BID) | ORAL | Status: DC

## 2018-09-17 MED ORDER — INSULIN ASPART 100 UNIT/ML ~~LOC~~ SOLN: 20.0000 [IU] | Freq: Three times a day (TID) | SUBCUTANEOUS | 11 refills | Status: DC

## 2018-09-17 MED ORDER — INSULIN DETEMIR 100 UNIT/ML ~~LOC~~ SOLN
72.0000 [IU] | Freq: Two times a day (BID) | SUBCUTANEOUS | Status: DC
  Administered 2018-09-17: 72 [IU] via SUBCUTANEOUS
  Filled 2018-09-17 (×2): qty 0.72

## 2018-09-17 MED ORDER — SACCHAROMYCES BOULARDII 250 MG PO CAPS
250.0000 mg | ORAL_CAPSULE | Freq: Two times a day (BID) | ORAL | Status: DC
  Administered 2018-09-17 – 2018-09-26 (×18): 250 mg via ORAL
  Filled 2018-09-17 (×18): qty 1

## 2018-09-17 MED ORDER — INFLUENZA VAC SPLIT QUAD 0.5 ML IM SUSY
0.5000 mL | PREFILLED_SYRINGE | INTRAMUSCULAR | Status: AC
  Administered 2018-09-20: 0.5 mL via INTRAMUSCULAR
  Filled 2018-09-17: qty 0.5

## 2018-09-17 MED ORDER — ACETAMINOPHEN 325 MG PO TABS
325.0000 mg | ORAL_TABLET | ORAL | Status: DC | PRN
  Administered 2018-09-17: 650 mg via ORAL
  Filled 2018-09-17: qty 2

## 2018-09-17 MED ORDER — HYDROCERIN EX CREA
TOPICAL_CREAM | Freq: Two times a day (BID) | CUTANEOUS | Status: DC
  Administered 2018-09-17 – 2018-09-20 (×7): via TOPICAL
  Administered 2018-09-21: 1 via TOPICAL
  Administered 2018-09-22 – 2018-09-26 (×9): via TOPICAL
  Filled 2018-09-17 (×2): qty 113

## 2018-09-17 MED ORDER — HYDROXYZINE HCL 10 MG PO TABS
10.0000 mg | ORAL_TABLET | Freq: Three times a day (TID) | ORAL | Status: DC | PRN
  Administered 2018-09-21: 10 mg via ORAL
  Filled 2018-09-17 (×2): qty 1

## 2018-09-17 MED ORDER — POLYETHYLENE GLYCOL 3350 17 G PO PACK: 17.0000 g | PACK | Freq: Every day | ORAL | Status: DC | PRN

## 2018-09-17 MED ORDER — LIRAGLUTIDE 18 MG/3ML ~~LOC~~ SOPN
1.8000 mg | PEN_INJECTOR | Freq: Every day | SUBCUTANEOUS | Status: DC
  Administered 2018-09-20 – 2018-09-26 (×7): 1.8 mg via SUBCUTANEOUS
  Filled 2018-09-17 (×3): qty 3

## 2018-09-17 MED ORDER — TRAZODONE HCL 50 MG PO TABS
25.0000 mg | ORAL_TABLET | Freq: Every evening | ORAL | Status: DC | PRN
  Administered 2018-09-18 – 2018-09-25 (×3): 50 mg via ORAL
  Filled 2018-09-17 (×4): qty 1

## 2018-09-17 MED ORDER — OXYCODONE HCL 5 MG PO TABS
5.0000 mg | ORAL_TABLET | ORAL | Status: DC | PRN
  Administered 2018-09-18 – 2018-09-24 (×11): 10 mg via ORAL
  Filled 2018-09-17 (×12): qty 2

## 2018-09-17 MED ORDER — CLONAZEPAM 0.25 MG PO TBDP
0.5000 mg | ORAL_TABLET | Freq: Two times a day (BID) | ORAL | Status: DC
  Administered 2018-09-17 – 2018-09-18 (×2): 0.5 mg via ORAL
  Filled 2018-09-17 (×2): qty 2

## 2018-09-17 MED ORDER — PROCHLORPERAZINE EDISYLATE 10 MG/2ML IJ SOLN: 5.0000 mg | Freq: Four times a day (QID) | INTRAMUSCULAR | Status: DC | PRN

## 2018-09-17 MED ORDER — BISACODYL 10 MG RE SUPP: 10.0000 mg | Freq: Every day | RECTAL | Status: DC | PRN

## 2018-09-17 MED ORDER — CLONAZEPAM 0.5 MG PO TBDP: 0.5000 mg | ORAL_TABLET | Freq: Two times a day (BID) | ORAL | 0 refills | Status: DC

## 2018-09-17 MED ORDER — GUAIFENESIN-DM 100-10 MG/5ML PO SYRP: 5.0000 mL | ORAL_SOLUTION | Freq: Four times a day (QID) | ORAL | Status: DC | PRN

## 2018-09-17 MED ORDER — PROCHLORPERAZINE 25 MG RE SUPP: 12.5000 mg | Freq: Four times a day (QID) | RECTAL | Status: DC | PRN

## 2018-09-17 MED ORDER — CYCLOBENZAPRINE HCL 5 MG PO TABS: 5.0000 mg | ORAL_TABLET | Freq: Three times a day (TID) | ORAL | Status: DC | PRN

## 2018-09-17 MED ORDER — PANTOPRAZOLE SODIUM 40 MG PO TBEC
40.0000 mg | DELAYED_RELEASE_TABLET | Freq: Every day | ORAL | Status: DC
  Administered 2018-09-17 – 2018-09-25 (×9): 40 mg via ORAL
  Filled 2018-09-17 (×9): qty 1

## 2018-09-17 MED ORDER — ASPIRIN EC 81 MG PO TBEC
81.0000 mg | DELAYED_RELEASE_TABLET | Freq: Every day | ORAL | Status: DC
  Administered 2018-09-18 – 2018-09-26 (×9): 81 mg via ORAL
  Filled 2018-09-17 (×9): qty 1

## 2018-09-17 MED ORDER — ENOXAPARIN SODIUM 100 MG/ML ~~LOC~~ SOLN
90.0000 mg | SUBCUTANEOUS | Status: DC
  Administered 2018-09-17 – 2018-09-24 (×8): 90 mg via SUBCUTANEOUS
  Filled 2018-09-17 (×9): qty 0.9

## 2018-09-17 MED ORDER — INSULIN ASPART 100 UNIT/ML ~~LOC~~ SOLN
0.0000 [IU] | Freq: Three times a day (TID) | SUBCUTANEOUS | Status: DC
  Administered 2018-09-17: 15 [IU] via SUBCUTANEOUS
  Administered 2018-09-18: 7 [IU] via SUBCUTANEOUS
  Administered 2018-09-18: 11 [IU] via SUBCUTANEOUS
  Administered 2018-09-19 (×2): 7 [IU] via SUBCUTANEOUS
  Administered 2018-09-20: 4 [IU] via SUBCUTANEOUS
  Administered 2018-09-20: 3 [IU] via SUBCUTANEOUS
  Administered 2018-09-21 – 2018-09-22 (×4): 4 [IU] via SUBCUTANEOUS
  Administered 2018-09-25: 3 [IU] via SUBCUTANEOUS
  Administered 2018-09-26 (×2): 4 [IU] via SUBCUTANEOUS

## 2018-09-17 MED ORDER — MORPHINE SULFATE ER 60 MG PO TBCR
60.0000 mg | EXTENDED_RELEASE_TABLET | Freq: Two times a day (BID) | ORAL | Status: DC
  Administered 2018-09-17 – 2018-09-26 (×18): 60 mg via ORAL
  Filled 2018-09-17 (×18): qty 1

## 2018-09-17 MED ORDER — DOCUSATE SODIUM 100 MG PO CAPS: 100.0000 mg | ORAL_CAPSULE | Freq: Two times a day (BID) | ORAL | 0 refills | Status: DC

## 2018-09-17 MED ORDER — TRAMADOL HCL 50 MG PO TABS
50.0000 mg | ORAL_TABLET | Freq: Four times a day (QID) | ORAL | Status: DC | PRN
  Administered 2018-09-17: 50 mg via ORAL
  Filled 2018-09-17: qty 1

## 2018-09-17 MED ORDER — FLEET ENEMA 7-19 GM/118ML RE ENEM: 1.0000 | ENEMA | Freq: Once | RECTAL | Status: DC | PRN

## 2018-09-17 MED ORDER — DIPHENHYDRAMINE HCL 12.5 MG/5ML PO ELIX
12.5000 mg | ORAL_SOLUTION | Freq: Four times a day (QID) | ORAL | Status: DC | PRN
  Administered 2018-09-18 – 2018-09-19 (×2): 25 mg via ORAL
  Filled 2018-09-17 (×2): qty 10

## 2018-09-17 MED ORDER — METOPROLOL TARTRATE 50 MG PO TABS
50.0000 mg | ORAL_TABLET | Freq: Two times a day (BID) | ORAL | Status: DC
  Administered 2018-09-17 – 2018-09-23 (×12): 50 mg via ORAL
  Filled 2018-09-17 (×12): qty 1

## 2018-09-17 MED ORDER — PRAVASTATIN SODIUM 10 MG PO TABS
20.0000 mg | ORAL_TABLET | Freq: Every day | ORAL | Status: DC
  Administered 2018-09-18 – 2018-09-26 (×9): 20 mg via ORAL
  Filled 2018-09-17 (×9): qty 2

## 2018-09-17 NOTE — Progress Notes (Signed)
CSW notes patient is going to CIR. CSW signing off as no further needs present.   Percell Locus Connor Foxworthy LCSW [phone]

## 2018-09-17 NOTE — Progress Notes (Signed)
Kristopher Arn, MD  Physician  Physical Medicine and Rehabilitation  Consult Note  Addendum  Date of Service:  09/17/2018 9:06 AM       Related encounter: Admission (Discharged) from 09/12/2018 in [place] All Collapse All    Show:Clear all [x] Manual[x] Template[] Copied  Added by: [x] Love, Pamela S, PA-C[x] Hope Pigeon, MD  [] Hover for details      Physical Medicine and Rehabilitation Consult   Reason for Consult: Functional deficits due to debility  Referring Physician:  Dr. Jamse Simon   HPI: Kristopher Simon is a 53 y.o. male with history of morbid obesity, T2DM--uncontrolled, HTN, CAD, left foot with charcot deformity, protein calorie malnutrition and chronic ulcer with increase in pain and edema. History taken from chart review and patient. He was admitted on 09/12/18 via [facility] with concerns of septic arthritis and chronic osteomyelitis. He was evaluated by Dr. 09/25/18 who recommended amputation due to massive destruction of talus and calcaneous.  He was taken to OR on 01/24 for left transtibial amputation and post op wound VAC to continue for 1-2 weeks.  Blood sugars continue to be poorly controlled and he has had issue with panic attacks therefore Klonopin added. Therapy evaluations done revealing functional deficits and CIR recommended for follow up therapy.    Review of Systems  Constitutional: Negative for chills and fever.  HENT: Negative for hearing loss and tinnitus.   Eyes: Negative for blurred vision and double vision.       Blind in left eye  Respiratory: Negative for cough and shortness of breath.   Cardiovascular: Negative for chest pain and palpitations.  Gastrointestinal: Positive for constipation. Negative for heartburn and nausea.  Genitourinary: Negative for dysuria and urgency.  Musculoskeletal: Positive for falls. Negative for back pain, myalgias and neck pain.  Skin: Negative for rash.  Neurological: Positive  for sensory change (numbness/tingling right foot intermittently) and focal weakness.  Psychiatric/Behavioral: The patient does not have insomnia.   All other systems reviewed and are negative.         Past Medical History:  Diagnosis Date  . Acute renal failure ([place]) 01/2012  . Anxiety   . Cellulitis   . Concussion   . Coronary artery disease   . Diabetes mellitus 1995  . Fibromyalgia   . Hypertension 1998         Past Surgical History:  Procedure Laterality Date  . AMPUTATION Left 09/14/2018   Procedure: LEFT BELOW KNEE AMPUTATION;  Surgeon: 09/27/2018, MD;  Location: [facility];  Service: Orthopedics;  Laterality: Left;  . CARDIAC CATHETERIZATION    . FRACTURE SURGERY    . HIP FRACTURE SURGERY           Family History  Problem Relation Age of Onset  . Hypertension Mother   . Dementia Father   . Alzheimer's disease Father     Social History:  Lives with mother. Independent with RW. Has been disabled since hip fracture 5 years ago but has had decline in mobility for past 6 months due to issues with left foot. He  reports that he has never smoked. He has never used smokeless tobacco. He reports current alcohol use of about 1.0 standard drinks of alcohol per week. He reports that he does not use drugs.         Allergies  Allergen Reactions  . Magnesium-Containing Compounds Anaphylaxis  . Sulfamethoxazole-Trimethoprim Swelling and Anaphylaxis    "tongue swelling" "tongue swelling" Pt  states tongue swells  . Ibuprofen Other (See Comments)    Kidney issues Kidney issues Shuts kidneys down Kidney issues Shuts kidneys down  . Pregabalin Swelling            Facility-Administered Medications Prior to Admission  Medication Dose Route Frequency Provider Last Rate Last Dose  . triamcinolone acetonide (KENALOG) 10 MG/ML injection 10 mg  10 mg Other Once Landis Martins, DPM             Medications Prior to Admission  Medication Sig  Dispense Refill  . amLODipine (NORVASC) 10 MG tablet Take 10 mg by mouth daily.    Marland Kitchen aspirin 325 MG tablet Take 325 mg by mouth daily.    . citalopram (CELEXA) 20 MG tablet Take 20 mg by mouth daily.    . clopidogrel (PLAVIX) 75 MG tablet Take 75 mg by mouth daily.    . cyclobenzaprine (FLEXERIL) 10 MG tablet Take 10 mg by mouth daily.     . furosemide (LASIX) 20 MG tablet Take 20 mg by mouth daily.  3  . gabapentin (NEURONTIN) 300 MG capsule Take 600 mg by mouth 2 (two) times daily.     Marland Kitchen gemfibrozil (LOPID) 600 MG tablet Take 1,200 mg by mouth 2 (two) times daily before a meal.    . gentamicin ointment (GARAMYCIN) 0.1 % Apply to wounds with each dressing change 15 g 0  . hydrOXYzine (ATARAX/VISTARIL) 10 MG tablet Take 10 mg by mouth daily.   0  . insulin aspart (NOVOLOG FLEXPEN) 100 UNIT/ML FlexPen Inject 60 Units into the skin See admin instructions. 3-4 times daily    . Insulin Degludec (TRESIBA FLEXTOUCH) 200 UNIT/ML SOPN Inject 154 Units into the skin daily.    . isosorbide mononitrate (IMDUR) 60 MG 24 hr tablet Take 60 mg by mouth daily.    . metoprolol (LOPRESSOR) 100 MG tablet Take 100 mg by mouth 2 (two) times daily.    . NUCYNTA 100 MG TABS Take 100 mg by mouth every 6 (six) hours as needed (chronic pain).   0  . omeprazole (PRILOSEC) 20 MG capsule Take 20 mg by mouth daily.    . pravastatin (PRAVACHOL) 20 MG tablet Take 20 mg by mouth daily.    Marland Kitchen VICTOZA 18 MG/3ML SOPN Inject 1.8 mg into the skin daily.   5    Home: Home Living Family/patient expects to be discharged to:: Private residence Living Arrangements: Parent Available Help at Discharge: Family, Available 24 hours/day Type of Home: House Home Access: [place]: One level Bathroom Shower/Tub: Chiropodist: Standard Bathroom Accessibility: No Home Equipment: Environmental consultant - 2 wheels  Functional History: Prior Function Level of Independence: Independent  with assistive device(s) Comments: ambulates with RW Functional Status:  Mobility: Bed Mobility Overal bed mobility: Needs Assistance Bed Mobility: Supine to Sit Supine to sit: Min guard Sit to supine: Min guard General bed mobility comments: min guard to come to EOB - verbal cueing for sequencing Transfers Overall transfer level: Needs assistance Equipment used: Rolling walker (2 wheeled) Transfers: Sit to/from Stand, [employer] Transfers Sit to Stand: Min guard Stand pivot transfers: Min assist, +2 safety/equipment General transfer comment: increased time and effort - education on hand placement and sequencing for safety and efficiency Ambulation/Gait General Gait Details: pt able to hop x2 in place on R LE with min A x2 for safety and stability  ADL: ADL Overall ADL's : Needs assistance/impaired Grooming: Set up, Sitting Upper Body Bathing: Set  up, Sitting Lower Body Bathing: Moderate assistance, Bed level Upper Body Dressing : Set up, Sitting Lower Body Dressing: Moderate assistance, Bed level Functional mobility during ADLs: +2 for safety/equipment General ADL Comments: Began educating pt on compensatory techniques. Pt abl eto bridge and push through RLE while maintaining NWB status on LLE  Cognition: Cognition Overall Cognitive Status: History of cognitive impairments - at baseline Orientation Level: Oriented X4 Cognition Arousal/Alertness: Awake/alert Behavior During Therapy: WFL for tasks assessed/performed Overall Cognitive Status: History of cognitive impairments - at baseline General Comments: Hx of TBI   Blood pressure 140/70, pulse 88, temperature 98.1 F (36.7 C), temperature source Oral, resp. rate 16, height 6\' 5"  (1.956 m), weight (!) 178.4 kg, SpO2 97 %. Physical Exam  Nursing note and vitals reviewed. Constitutional: He is oriented to person, place, and time. He appears well-developed.  Morbidly obese male  HENT:  Head: Normocephalic and  atraumatic.  Eyes: EOM are normal. Right eye exhibits no discharge. Left eye exhibits no discharge.  Neck: Normal range of motion. Neck supple.  Cardiovascular: Normal rate and regular rhythm.  Respiratory: Effort normal and breath sounds normal.  GI: Soft. Bowel sounds are normal.  Musculoskeletal:        General: No edema.     Comments: Left BKA with compressive wrap and wound VAC in place.    Neurological: He is alert and oriented to person, place, and time.  Motor: B/l UE: 5/5 proximal to to distal RLE: 4+/5 (contralateral pain inhibition) LLE: HF: 4+/5 (pain inhibition) Sensation intact to light touch  Skin:  RLE multiple scabs on tibia and dry flaky skin with callused area lateral foot.   Psychiatric: He has a normal mood and affect. His behavior is normal. Thought content normal.    LabResultsLast24Hours  Results for orders placed or performed during the hospital encounter of 09/12/18 (from the past 24 hour(s))  Glucose, capillary     Status: Abnormal   Collection Time: 09/16/18 11:58 AM  Result Value Ref Range   Glucose-Capillary 284 (H) 70 - 99 mg/dL  Glucose, capillary     Status: Abnormal   Collection Time: 09/16/18  5:03 PM  Result Value Ref Range   Glucose-Capillary 253 (H) 70 - 99 mg/dL  Glucose, capillary     Status: Abnormal   Collection Time: 09/16/18  9:50 PM  Result Value Ref Range   Glucose-Capillary 262 (H) 70 - 99 mg/dL  Basic metabolic panel     Status: Abnormal   Collection Time: 09/17/18  4:03 AM  Result Value Ref Range   Sodium 133 (L) 135 - 145 mmol/L   Potassium 3.9 3.5 - 5.1 mmol/L   Chloride 95 (L) 98 - 111 mmol/L   CO2 27 22 - 32 mmol/L   Glucose, Bld 276 (H) 70 - 99 mg/dL   BUN 9 6 - 20 mg/dL   Creatinine, Ser 1.19 0.61 - 1.24 mg/dL   Calcium 9.0 8.9 - 10.3 mg/dL   GFR calc non Af Amer >60 >60 mL/min   GFR calc Af Amer >60 >60 mL/min   Anion gap 11 5 - 15  Glucose, capillary     Status: Abnormal   Collection  Time: 09/17/18  8:21 AM  Result Value Ref Range   Glucose-Capillary 249 (H) 70 - 99 mg/dL     ImagingResults(Last48hours)  No results found.    Assessment/Plan: Diagnosis: Left BKA Labs independently reviewed.  Records reviewed and summated above. Clean amputation daily with soap and water Monitor incision  site for signs of infection or impending skin breakdown. Staples to remain in place for 3-4 weeks Stump shrinker, for edema control  Scar mobilization massaging to prevent soft tissue adherence Stump protector during therapies Prevent flexion contractures by implementing the following:              Encourage prone lying for 20-30 mins per day BID to avoid hip flexion       Contractures if medically appropriate;             Avoid pillow under knees when patient is lying in bed in order to prevent both        knee and hip flexion contractures;             Avoid prolonged sitting Post surgical pain control with oral medication Phantom limb pain control with physical modalities including desensitization techniques (gentle self massage to the residual stump,hot packs if sensation intact, [place]) and mirror therapy, TENS. If ineffective, consider pharmacological treatment for neuropathic pain (e.g gabapentin, pregabalin, amytriptalyine, duloxetine).  When using wheelchair, patient should have knee on amputated side fully extended with board under the seat cushion. Avoid injury to contralateral side  1. Does the need for close, 24 hr/day medical supervision in concert with the patient's rehab needs make it unreasonable for this patient to be served in a less intensive setting? Yes  2. Co-Morbidities requiring supervision/potential complications: morbid obesity (encourage weight loss), T2 DM (Monitor in accordance with exercise and adjust meds as necessary), HTN (monitor and provide prns in accordance with increased physical exertion and pain), CAD, protein calorie malnutrition,  hyponatremia (cont to monitor, repeat labs, treat if necessary), ABLA (repeat labs, transfuse to ensure appropriate perfusion for increased activity tolerance), post-op pain (Biofeedback training with therapies to help reduce reliance on opiate pain medications, monitor pain control during therapies, and sedation at rest and titrate to maximum efficacy to ensure participation and gains in therapies) 3. Due to safety, skin/wound care, disease management, pain management and patient education, does the patient require 24 hr/day rehab nursing? Yes 4. Does the patient require coordinated care of a physician, rehab nurse, PT (1-2 hrs/day, 5 days/week) and OT (1-2 hrs/day, 5 days/week) to address physical and functional deficits in the context of the above medical diagnosis(es)? Yes Addressing deficits in the following areas: balance, endurance, locomotion, strength, transferring, bathing, dressing, toileting and psychosocial support 5. Can the patient actively participate in an intensive therapy program of at least 3 hrs of therapy per day at least 5 days per week? Yes 6. The potential for patient to make measurable gains while on inpatient rehab is excellent 7. Anticipated functional outcomes upon discharge from inpatient rehab are Mod I  with PT, Mod I with OT, n/a with SLP. 8. Estimated rehab length of stay to reach the above functional goals is: 4-7 days. 9. Anticipated D/C setting: Home 10. Anticipated post D/C treatments: HH therapy and Home excercise program 11. Overall Rehab/Functional Prognosis: excellent  RECOMMENDATIONS: This patient's condition is appropriate for continued rehabilitative care in the following setting: CIR Patient has agreed to participate in recommended program. Yes Note that insurance prior authorization may be required for reimbursement for recommended care.  Comment: Rehab Admissions Coordinator to follow up.   I have personally performed a face to face diagnostic  evaluation, including, but not limited to relevant history and physical exam findings, of this patient and developed relevant assessment and plan.  Additionally, I have reviewed and concur with the physician assistant's documentation  above.   Delice Lesch, MD, ABPMR Bary Leriche, PA-C 09/17/2018    Revision History                             Routing History

## 2018-09-17 NOTE — Discharge Instructions (Signed)
Follow with Primary MD Martinique, Sarah T, MD in 7 days   Activity: As tolerated with Full fall precautions use walker/cane & assistance as needed  Disposition Rehab  Diet: Heart Healthy Low Carb, check CBGs QAC-HS.   Special Instructions: If you have smoked or chewed Tobacco  in the last 2 yrs please stop smoking, stop any regular Alcohol  and or any Recreational drug use.  On your next visit with your primary care physician please Get Medicines reviewed and adjusted.  Please request your Prim.MD to go over all Hospital Tests and Procedure/Radiological results at the follow up, please get all Hospital records sent to your Prim MD by signing hospital release before you go home.  If you experience worsening of your admission symptoms, develop shortness of breath, life threatening emergency, suicidal or homicidal thoughts you must seek medical attention immediately by calling 911 or calling your MD immediately  if symptoms less severe.  You Must read complete instructions/literature along with all the possible adverse reactions/side effects for all the Medicines you take and that have been prescribed to you. Take any new Medicines after you have completely understood and accpet all the possible adverse reactions/side effects.

## 2018-09-17 NOTE — Progress Notes (Signed)
Patient ID: Kristopher Simon, male   DOB: Apr 08, 1966, 53 y.o.   MRN: [id number] Admit to unit via recliner. Oriented to unit, therapy schedule plan of care and medication regimen. Margarito Liner

## 2018-09-17 NOTE — Progress Notes (Signed)
PROGRESS NOTE                                                                                                                                                                                                             Patient Demographics:    Kristopher Simon, is a 53 y.o. male, DOB - Aug 23, 1965, [id number]  Admit date - 09/12/2018   Admitting Physician Shela Leff, MD  Outpatient Primary MD for the patient is [place], Sarah T, MD  LOS - 5  Chief Complaint: Left foot/ankle pain and swelling  Brief Narrative  Kristopher Simon is a 53 year old male with past medical history of obesity, uncontrolled type 2 diabetes mellitus, stroke, CAD, hypertension, dyslipidemia, Charcot foot deformity on the left followed by podiatrist Dr. Cannon Kettle with multiple surgeries, last surgery on the left foot was in June 2018.  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 Cannon Kettle, this was being treated with local wound care, medahoney etc. for the past 4 to 5 days patient had increased pain and swelling in his lower calf, ankle region, around the same time he had a dental issue which he needed to sort out, he denies any subjective fevers or chills, yesterday due to worsening pain and swelling, he was sent here from [facility] ER for further evaluation and management of this problem.   Subjective:   Patient in bed, appears comfortable, denies any headache, no fever, no chest pain or pressure, no shortness of breath , no abdominal pain. No focal weakness. Mild L stump pain.   Assessment  & Plan :     1.  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 He failed outpatient conservative management with antibiotics and multiple surgeries, finally orthopedics Dr. 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  Antibiotics have been discontinued, since tolerating oral diet and in no  distress on appearance have transitioned him to oral pain regimen, advance activity may require placement, PT on board.  Stable likely discharge on 09/18/2018.  2.  History of CAD with stent.  On aspirin-Plavix, statin and beta-blocker.  Chest pain-free, EKG & echo stable.  3.  History of CVA.  Aspirin-Plavix and statin to be continued for secondary prevention.  4.  Morbid obesity.  Follow with PCP for weight loss.  5.  Chronic pain.  Supportive care.  Has a #1 above.  6.  Panic  Attacks - Stable , chronic problem, added Klonopin with good effect.  7.  Constipation.  Placed on aggressive bowel regimen will monitor.    8. DM type II.  On Levemir and sliding scale, dose further adjusted on 09/17/2018 for better control  Lab Results  Component Value Date   HGBA1C 10.1 (H) 09/13/2018   CBG (last 3)  Recent Labs    09/16/18 1703 09/16/18 2150 09/17/18 0821  GLUCAP 253* 262* 249*     Family Communication  :  None  Code Status :  Full  Disposition Plan  :  TBD  Consults  :  Dr Sharol Given  Procedures  :    L- BKA by DR Sharol Given 09/14/18   TTE -   - Left ventricle: The cavity size was normal. There was moderate concentric hypertrophy. Systolic function was normal. The   estimated ejection fraction was in the range of 50% to 55%. Wall  motion was normal; there were no regional wall motion   abnormalities. The study is indeterminate for the evaluation of LV diastolic function. - Aortic valve: Trileaflet; mildly thickened, mildly calcified  leaflets. There was no regurgitation. - Mitral valve: Calcified annulus. There was trivial regurgitation. - Right ventricle: Systolic function was normal. - Atrial septum: No defect or patent foramen ovale was identified.  DVT Prophylaxis  :  Lovenox    Lab Results  Component Value Date   PLT 388 09/16/2018    Diet :  Diet Order            Diet Carb Modified Fluid consistency: Thin; Room service appropriate? Yes  Diet effective now                 Inpatient Medications Scheduled Meds: . aspirin EC  81 mg Oral Daily  . bisacodyl  10 mg Oral Daily  . bisacodyl  10 mg Oral Once  . clonazepam  0.5 mg Oral BID  . clopidogrel  75 mg Oral Daily  . docusate sodium  100 mg Oral BID  . enoxaparin (LOVENOX) injection  0.5 mg/kg Subcutaneous Q24H  . gabapentin  300 mg Oral TID  . hydrALAZINE  25 mg Oral Q8H  . insulin aspart  0-15 Units Subcutaneous TID WC  . insulin aspart  0-5 Units Subcutaneous QHS  . insulin aspart  20 Units Subcutaneous TID WC  . insulin detemir  72 Units Subcutaneous BID  . magnesium hydroxide  30 mL Oral BID  . metoprolol tartrate  50 mg Oral BID  . morphine  60 mg Oral Q12H  . pantoprazole  40 mg Oral Q2200  . polyethylene glycol  17 g Oral BID  . saccharomyces boulardii  250 mg Oral BID   Continuous Infusions: . sodium chloride 10 mL/hr at 09/14/18 1353   PRN Meds:.acetaminophen, hydrALAZINE, hydrOXYzine, magnesium citrate, methocarbamol **OR** [DISCONTINUED] methocarbamol (ROBAXIN) IV, metoprolol tartrate, [DISCONTINUED] ondansetron **OR** ondansetron (ZOFRAN) IV, oxyCODONE  Antibiotics  :   Anti-infectives (From admission, onward)   Start     Dose/Rate Route Frequency Ordered Stop   09/14/18 0830  ceFAZolin (ANCEF) 3 g in dextrose 5 % 50 mL IVPB     3 g 100 mL/hr over 30 Minutes Intravenous To [facility] 09/13/18 1427 09/14/18 0923   09/13/18 0600  vancomycin (VANCOCIN) 2,000 mg in sodium chloride 0.9 % 500 mL IVPB  Status:  Discontinued     2,000 mg 250 mL/hr over 120 Minutes Intravenous Every 12 hours 09/12/18 1703 09/15/18 0947   09/12/18 1800  piperacillin-tazobactam (ZOSYN) IVPB 3.375 g  Status:  Discontinued     3.375 g 12.5 mL/hr over 240 Minutes Intravenous Every 8 hours 09/12/18 1703 09/15/18 0947   09/12/18 1715  vancomycin (VANCOCIN) 2,500 mg in sodium chloride 0.9 % 500 mL IVPB     2,500 mg 250 mL/hr over 120 Minutes Intravenous NOW 09/12/18 1647 09/13/18 0120           Objective:   Vitals:   09/16/18 0932 09/16/18 1516 09/17/18 0530 09/17/18 0956  BP: (!) 140/92 (!) 151/91 140/70 140/84  Pulse: 99 98 88 92  Resp:      Temp:   98.1 F (36.7 C)   TempSrc:   Oral   SpO2:   97%   Weight:      Height:        Wt Readings from Last 3 Encounters:  09/14/18 (!) 178.4 kg  07/11/14 (!) 169.8 kg  02/08/12 (!) 161.1 kg     Intake/Output Summary (Last 24 hours) at 09/17/2018 1159 Last data filed at 09/16/2018 1529 Gross per 24 hour  Intake 500 ml  Output 425 ml  Net 75 ml     Physical Exam  Awake Alert, Oriented X 3, No new F.N deficits, Normal affect [place].AT,PERRAL Supple Neck,No JVD, No cervical lymphadenopathy appriciated.  Symmetrical Chest wall movement, Good air movement bilaterally, CTAB RRR,No Gallops, Rubs or new Murmurs, No Parasternal Heave +ve B.Sounds, Abd Soft, No tenderness, No organomegaly appriciated, No rebound - guarding or rigidity. No Cyanosis, Clubbing or edema,  L.BKA with W Vac     Data Review:    CBC Recent Labs  Lab 09/12/18 1938 09/13/18 0502 09/14/18 0512 09/15/18 0326 09/16/18 0342  WBC 10.8* 10.5 9.2 11.0* 9.7  HGB 11.2* 11.1* 11.1* 9.8* 10.8*  HCT 35.4* 34.9* 36.3* 33.9* 35.6*  PLT 449* 435* 403* 417* 388  MCV 81.4 82.5 82.7 85.0 85.8  MCH 25.7* 26.2 25.3* 24.6* 26.0  MCHC 31.6 31.8 30.6 28.9* 30.3  RDW 15.9* 16.1* 16.2* 16.3* 16.3*    Chemistries  Recent Labs  Lab 09/12/18 1938 09/13/18 0502 09/14/18 0512 09/16/18 0342 09/17/18 0403  NA  --  135 136 136 133*  K  --  3.3* 3.9 4.1 3.9  CL  --  100 101 98 95*  CO2  --  25 26 27 27   GLUCOSE  --  168* 395* 219* 276*  BUN  --  9 8 7 9   CREATININE 0.99 0.91 1.16 1.31* 1.19  CALCIUM  --  8.7* 8.7* 8.9 9.0  MG  --   --  1.9  --   --    ------------------------------------------------------------------------------------------------------------------ No results for input(s): CHOL, HDL, LDLCALC, TRIG, CHOLHDL, LDLDIRECT in the last 72  hours.  Lab Results  Component Value Date   HGBA1C 10.1 (H) 09/13/2018   ------------------------------------------------------------------------------------------------------------------ No results for input(s): TSH, T4TOTAL, T3FREE, THYROIDAB in the last 72 hours.  Invalid input(s): FREET3 ------------------------------------------------------------------------------------------------------------------ No results for input(s): VITAMINB12, FOLATE, FERRITIN, TIBC, IRON, RETICCTPCT in the last 72 hours.  Coagulation profile No results for input(s): INR, PROTIME in the last 168 hours.  No results for input(s): DDIMER in the last 72 hours.  Cardiac Enzymes No results for input(s): CKMB, TROPONINI, MYOGLOBIN in the last 168 hours.  Invalid input(s): CK ------------------------------------------------------------------------------------------------------------------ No results found for: BNP  Micro Results Recent Results (from the past 240 hour(s))  Surgical pcr screen     Status: None   Collection Time: 09/13/18  5:50 PM  Result Value  Ref Range Status   MRSA, PCR NEGATIVE NEGATIVE Final   Staphylococcus aureus NEGATIVE NEGATIVE Final    Comment: (NOTE) The Xpert SA Assay (FDA approved for NASAL specimens in patients 16 years of age and older), is one component of a comprehensive surveillance program. It is not intended to diagnose infection nor to guide or monitor treatment. Performed at [facility], [place] [address] [place], [place] [address]     Radiology Reports No results found.  Time Spent in minutes  30   Lala Lund M.D on 09/17/2018 at 11:59 AM  To page go to [email or web address] - password [facility]

## 2018-09-17 NOTE — H&P (Signed)
Physical Medicine and Rehabilitation Admission H&P    CC: L-BKA   HPI: Kristopher Simon is a 53 year old male with history of T2DM poorly controlled, morbid obesity- BMI 46, HTN, CAD, left foot with Charcot deformity, protein calorie malnutrition and chronic ulcer left foot with increasing pain and edema.  History taken from chart review and patient. He was admitted on 09/12/2018 via [facility] with concerns of septic arthritis and chronic osteomyelitis.  He was started on IV antibiotics and Dr. Sharol Given consulted for input.  Amputation recommended due to massive destruction of talus and calcaneus.  He was taken to the OR on 09/14/2018 for left transtibial amputation and postop wound VAC to continue for 1 to 2 weeks.  Patient has had issues with poorly controlled blood pressures as well as anxiety therefore Klonopin added for mood stabilization.  Therapy evaluations completed revealing functional deficits and CIR recommended for follow-up therapy. Please also see consult note from today.   Review of Systems  Constitutional: Negative for chills and fever.  HENT: Negative for hearing loss and tinnitus.   Eyes: Negative for double vision.       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   Cardiovascular: Negative for chest pain and palpitations.  Gastrointestinal: Positive for constipation. Negative for heartburn and nausea.  Genitourinary: Negative for dysuria and urgency.  Musculoskeletal: Positive for falls.  Skin: Negative for itching and rash.  Neurological: Positive for sensory change and weakness. Negative for dizziness and headaches.  Psychiatric/Behavioral: Negative for depression. The patient is not nervous/anxious and does not have insomnia.   All other systems reviewed and are negative.    Past Medical History:  Diagnosis Date  . Acute renal failure ([place]) 01/2012  . Anxiety   . Cellulitis   . Concussion   .  Coronary artery disease   . Diabetes mellitus 1995  . Fibromyalgia   . Hypertension 1998    Past Surgical History:  Procedure Laterality Date  . AMPUTATION Left 09/14/2018   Procedure: LEFT BELOW KNEE AMPUTATION;  Surgeon: Newt Minion, MD;  Location: [facility];  Service: Orthopedics;  Laterality: Left;  . CARDIAC CATHETERIZATION    . FRACTURE SURGERY    . HIP FRACTURE SURGERY      Family History  Problem Relation Age of Onset  . Hypertension Mother   . Dementia Father   . Alzheimer's disease Father     Social History:  Lives with mother. Independent with walker PTA.  reports that he has never smoked. He has never used smokeless tobacco. He reports current alcohol use of about 1.0 standard drinks of alcohol per week. He reports that he does not use drugs.   Allergies  Allergen Reactions  . Magnesium-Containing Compounds Anaphylaxis  . Sulfamethoxazole-Trimethoprim Swelling and Anaphylaxis    "tongue swelling" "tongue swelling" Pt states tongue swells  . Ibuprofen Other (See Comments)    Kidney issues Kidney issues Shuts kidneys down Kidney issues Shuts kidneys down  . Pregabalin Swelling   Facility-Administered Medications Prior to Admission  Medication Dose Route Frequency Provider Last Rate Last Dose  . triamcinolone acetonide (KENALOG) 10 MG/ML injection 10 mg  10 mg Other Once Landis Martins, DPM       Medications Prior to Admission  Medication Sig Dispense Refill  . amLODipine (NORVASC) 10 MG tablet Take 10 mg by mouth daily.    Marland Kitchen aspirin 325 MG tablet Take 325 mg  by mouth daily.    . citalopram (CELEXA) 20 MG tablet Take 20 mg by mouth daily.    . clopidogrel (PLAVIX) 75 MG tablet Take 75 mg by mouth daily.    . cyclobenzaprine (FLEXERIL) 10 MG tablet Take 10 mg by mouth daily.     . furosemide (LASIX) 20 MG tablet Take 20 mg by mouth daily.  3  . gabapentin (NEURONTIN) 300 MG capsule Take 600 mg by mouth 2 (two) times daily.     Marland Kitchen gemfibrozil (LOPID) 600 MG  tablet Take 1,200 mg by mouth 2 (two) times daily before a meal.    . gentamicin ointment (GARAMYCIN) 0.1 % Apply to wounds with each dressing change 15 g 0  . hydrOXYzine (ATARAX/VISTARIL) 10 MG tablet Take 10 mg by mouth daily.   0  . insulin aspart (NOVOLOG FLEXPEN) 100 UNIT/ML FlexPen Inject 60 Units into the skin See admin instructions. 3-4 times daily    . Insulin Degludec (TRESIBA FLEXTOUCH) 200 UNIT/ML SOPN Inject 154 Units into the skin daily.    . isosorbide mononitrate (IMDUR) 60 MG 24 hr tablet Take 60 mg by mouth daily.    . metoprolol (LOPRESSOR) 100 MG tablet Take 100 mg by mouth 2 (two) times daily.    . NUCYNTA 100 MG TABS Take 100 mg by mouth every 6 (six) hours as needed (chronic pain).   0  . omeprazole (PRILOSEC) 20 MG capsule Take 20 mg by mouth daily.    . pravastatin (PRAVACHOL) 20 MG tablet Take 20 mg by mouth daily.    Marland Kitchen VICTOZA 18 MG/3ML SOPN Inject 1.8 mg into the skin daily.   5    Drug Regimen Review  Drug regimen was reviewed and remains appropriate with no significant issues identified  Home: Home Living Family/patient expects to be discharged to:: Private residence Living Arrangements: Parent Available Help at Discharge: Family, Available 24 hours/day Type of Home: House Home Access: Ramped entrance Home Layout: One level Bathroom Shower/Tub: Chiropodist: Standard Bathroom Accessibility: No Home Equipment: Environmental consultant - 2 wheels   Functional History: Prior Function Level of Independence: Independent with assistive device(s) Comments: ambulates with RW  Functional Status:  Mobility: Bed Mobility Overal bed mobility: Needs Assistance Bed Mobility: Supine to Sit Supine to sit: Min guard Sit to supine: Min guard General bed mobility comments: min guard to come to EOB - verbal cueing for sequencing Transfers Overall transfer level: Needs assistance Equipment used: Rolling walker (2 wheeled) Transfers: Sit to/from Stand, [employer] Transfers Sit to Stand: Min guard Stand pivot transfers: Min assist, +2 safety/equipment General transfer comment: increased time and effort - education on hand placement and sequencing for safety and efficiency Ambulation/Gait General Gait Details: pt able to hop x2 in place on R LE with min A x2 for safety and stability    ADL: ADL Overall ADL's : Needs assistance/impaired Grooming: Set up, Sitting Upper Body Bathing: Set up, Sitting Lower Body Bathing: Moderate assistance, Bed level Upper Body Dressing : Set up, Sitting Lower Body Dressing: Moderate assistance, Bed level Functional mobility during ADLs: +2 for safety/equipment General ADL Comments: Began educating pt on compensatory techniques. Pt abl eto bridge and push through RLE while maintaining NWB status on LLE  Cognition: Cognition Overall Cognitive Status: History of cognitive impairments - at baseline Orientation Level: Oriented X4 Cognition Arousal/Alertness: Awake/alert Behavior During Therapy: WFL for tasks assessed/performed Overall Cognitive Status: History of cognitive impairments - at baseline General Comments: Hx of TBI  Blood pressure 140/84, pulse 92, temperature 98.1 F (36.7 C), temperature source Oral, resp. rate 16, height 6\' 5"  (1.956 m), weight (!) 178.4 kg, SpO2 97 %. Physical Exam  Nursing note and vitals reviewed. Constitutional: He is oriented to person, place, and time. He appears well-developed and well-nourished.  Morbidly obese male. NAD.  GI: He exhibits no distension. There is no abdominal tenderness.  Musculoskeletal:     Comments: L-BKA with compressive coban dressing and wound VAC in place.R- foot with dry flaky skin.  Callused ulcer right lateral foot.   Neurological: He is alert and oriented to person, place, and time.  Motor: B/l UE: 5/5 proximal to to distal RLE: 4+/5 (contralateral pain inhibition) LLE: HF: 4+/5 (pain inhibition) Sensation intact to light touch  Skin:    RLE multiple scabs on tibia and dry flaky skin with callused area lateral foot  Psychiatric: He has a normal mood and affect. His behavior is normal.    Results for orders placed or performed during the hospital encounter of 09/12/18 (from the past 48 hour(s))  Glucose, capillary     Status: Abnormal   Collection Time: 09/15/18  5:11 PM  Result Value Ref Range   Glucose-Capillary 260 (H) 70 - 99 mg/dL  Glucose, capillary     Status: Abnormal   Collection Time: 09/15/18  9:21 PM  Result Value Ref Range   Glucose-Capillary 239 (H) 70 - 99 mg/dL  CBC     Status: Abnormal   Collection Time: 09/16/18  3:42 AM  Result Value Ref Range   WBC 9.7 4.0 - 10.5 K/uL   RBC 4.15 (L) 4.22 - 5.81 MIL/uL   Hemoglobin 10.8 (L) 13.0 - 17.0 g/dL   HCT 35.6 (L) 39.0 - 52.0 %   MCV 85.8 80.0 - 100.0 fL   MCH 26.0 26.0 - 34.0 pg   MCHC 30.3 30.0 - 36.0 g/dL   RDW 16.3 (H) 11.5 - 15.5 %   Platelets 388 150 - 400 K/uL   nRBC 0.0 0.0 - 0.2 %    Comment: Performed at [facility], [place] [address] [place], [place] [address]  Basic metabolic panel     Status: Abnormal   Collection Time: 09/16/18  3:42 AM  Result Value Ref Range   Sodium 136 135 - 145 mmol/L   Potassium 4.1 3.5 - 5.1 mmol/L   Chloride 98 98 - 111 mmol/L   CO2 27 22 - 32 mmol/L   Glucose, Bld 219 (H) 70 - 99 mg/dL   BUN 7 6 - 20 mg/dL   Creatinine, Ser 1.31 (H) 0.61 - 1.24 mg/dL   Calcium 8.9 8.9 - 10.3 mg/dL   GFR calc non Af Amer >60 >60 mL/min   GFR calc Af Amer >60 >60 mL/min   Anion gap 11 5 - 15    Comment: Performed at [facility], [place] [address], [place], [place] [address]  Glucose, capillary     Status: Abnormal   Collection Time: 09/16/18  8:03 AM  Result Value Ref Range   Glucose-Capillary 239 (H) 70 - 99 mg/dL  Glucose, capillary     Status: Abnormal   Collection Time: 09/16/18 11:58 AM  Result Value Ref Range   Glucose-Capillary 284 (H) 70 - 99 mg/dL  Glucose, capillary     Status: Abnormal   Collection  Time: 09/16/18  5:03 PM  Result Value Ref Range   Glucose-Capillary 253 (H) 70 - 99 mg/dL  Glucose, capillary  Status: Abnormal   Collection Time: 09/16/18  9:50 PM  Result Value Ref Range   Glucose-Capillary 262 (H) 70 - 99 mg/dL  Basic metabolic panel     Status: Abnormal   Collection Time: 09/17/18  4:03 AM  Result Value Ref Range   Sodium 133 (L) 135 - 145 mmol/L   Potassium 3.9 3.5 - 5.1 mmol/L   Chloride 95 (L) 98 - 111 mmol/L   CO2 27 22 - 32 mmol/L   Glucose, Bld 276 (H) 70 - 99 mg/dL   BUN 9 6 - 20 mg/dL   Creatinine, Ser 1.19 0.61 - 1.24 mg/dL   Calcium 9.0 8.9 - 10.3 mg/dL   GFR calc non Af Amer >60 >60 mL/min   GFR calc Af Amer >60 >60 mL/min   Anion gap 11 5 - 15    Comment: Performed at [facility], [place] [address], [place], [place] [address]  Glucose, capillary     Status: Abnormal   Collection Time: 09/17/18  8:21 AM  Result Value Ref Range   Glucose-Capillary 249 (H) 70 - 99 mg/dL  Glucose, capillary     Status: Abnormal   Collection Time: 09/17/18 12:02 PM  Result Value Ref Range   Glucose-Capillary 306 (H) 70 - 99 mg/dL   No results found.     Medical Problem List and Plan: 1.  Deficits with mobility, transfers, balance, self-care secondary to left BKA.  Admit to CIR. 2.  DVT Prophylaxis/Anticoagulation: Pharmaceutical: Lovenox 3. Chronic pain/Pain Management: On MS contin 60 mg bid with oxycodone prn.   4. Mood: LCSW to follow for evaluation and support 5. Neuropsych: This patient is capable of making decisions on his own behalf. 6. Skin/Wound Care: Continue compressive dressing with wound VAC 1-2 weeks per surgeon.  7. Fluids/Electrolytes/Nutrition: Monitor I/O. Check lytes in am. 8. HTN: Monitor BP bid--continue hydralazine tid. OFF Norvasc, Lasix and Imdur at this time.  9. H/o CAD:  Stable on Metoprolol, Plavix and ASA. Resume Lopid.   10. T2DM poorly controlled: Was on Victoza 1.8, Tresiba 154 Units  and novolog 60 units?  Will change  back to insulin glargine 72 units bid and resume Victoza. Continue Novolog 20 units tid ac meal coverage.   Monitor BS ac/hs and use SSI for tighter control.  11. Insensate diabetic neuropathy: Continue gabapentin 300 mg tid.  12. H/o Panic attacks?: Cont Klonopin bid.  13. Constipation: Increase Miralax to bid.  Has received MOM this am.  14. Morbid obesity: Bariatric bed. Educate patient on appropriate diet and weight loss to help promote health and mobility.  15. ABLA: Recheck CBC in am.  16. Hyponatremia: Recheck lytes in am.  17. Low calorie malnutrition: will add prostat for healing.    Post Admission Physician Evaluation: 1. Preadmission assessment reviewed and changes made below. 2. Functional deficits secondary  to left BKA.. 3. Patient is admitted to receive collaborative, interdisciplinary care between the physiatrist, rehab nursing staff, and therapy team. 4. Patient's level of medical complexity and substantial therapy needs in context of that medical necessity cannot be provided at a lesser intensity of care such as a SNF. 5. Patient has experienced substantial functional loss from his/her baseline which was documented above under the "Functional History" and "Functional Status" headings.  Judging by the patient's diagnosis, physical exam, and functional history, the patient has potential for functional progress which will result in measurable gains while on inpatient rehab.  These gains will be of substantial and practical use  upon discharge  in facilitating mobility and self-care at the household level. 6. Physiatrist will provide 24 hour management of medical needs as well as oversight of the therapy plan/treatment and provide guidance as appropriate regarding the interaction of the two. 7. 24 hour rehab nursing will assist with bladder management, safety, skin/wound care, disease management, pain management and patient education  and help integrate therapy concepts,  techniques,education, etc. 8. PT will assess and treat for/with: Lower extremity strength, range of motion, stamina, balance, functional mobility, safety, adaptive techniques and equipment, wound care, coping skills, pain control, education. Goals are: Mod I. 9. OT will assess and treat for/with: ADL's, functional mobility, safety, upper extremity strength, adaptive techniques and equipment, wound mgt, ego support, and community reintegration.   Goals are: Mod I. Therapy may not proceed with showering this patient. 10. Case Management and Social Worker will assess and treat for psychological issues and discharge planning. 11. Team conference will be held weekly to assess progress toward goals and to determine barriers to discharge. 12. Patient will receive at least 3 hours of therapy per day at least 5 days per week. 13. ELOS: 4-7 days.       14. Prognosis:  good  I have personally performed a face to face diagnostic evaluation, including, but not limited to relevant history and physical exam findings, of this patient and developed relevant assessment and plan.  Additionally, I have reviewed and concur with the physician assistant's documentation above.  The patient's status has not changed. The original post admission physician evaluation remains appropriate, and any changes from the pre-admission screening or documentation from the acute chart are noted above.    Delice Lesch, MD, ABPMR Bary Leriche, PA-C 09/17/2018

## 2018-09-17 NOTE — Progress Notes (Signed)
Inpatient Rehabilitation Admissions Coordinator  I met with patient at bedside to discuss goals and expectations of an inpt rehab admit. He is in agreement and prefers inpt rehab rather than SNF. I contacted Dr. Candiss Norse and he will prepare pt for d/c I have updated RN CM and SW. I will make the arrangements to admit today.  Danne Baxter, RN, MSN Rehab Admissions Coordinator [phone] 09/17/2018 12:46 PM

## 2018-09-17 NOTE — Discharge Summary (Signed)
Kristopher Simon XBJ:[id number] DOB: 09-25-1965 DOA: 09/12/2018  PCP: Martinique, Sarah T, MD  Admit date: 09/12/2018  Discharge date: 09/17/2018  Admitted From: Home  Disposition:  Rehab   Recommendations for Outpatient Follow-up:   Follow up with PCP in 1-2 weeks  PCP Please obtain BMP/CBC, 2 view CXR in 1week,  (see Discharge instructions)   PCP Please follow up on the following pending results:    Home Health: None   Equipment/Devices: None  Consultations: Ortho Discharge Condition: Stable   CODE STATUS: Full   Diet Recommendation: Heart Healthy Low Carb  CC - L foot infection   Brief history of present illness from the day of admission and additional interim summary    Kristopher Simon is a 53 year old male with past medical history of obesity, uncontrolled type 2 diabetes mellitus, stroke, CAD, hypertension, dyslipidemia,Charcot foot deformity on the left followed by podiatrist Dr. Cannon Kettle with multiple surgeries, last surgery on the left foot was inJune 2018.Patient had a chronic ulcer at the base of the right ankle for which she had been following closely with Dr. Cannon Kettle, this was being treated with local wound care, medahoney etc. for the past 4 to 5 days patient had increased pain and swelling in his lower calf, ankle region,around the same time he had a dentalissue which he needed to sort out,he denies any subjective fevers or chills,yesterday due to worsening pain and swelling, he was sent here from [facility] ER for further evaluation and management of this problem.                                                                 Hospital Course   1.  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 He failed outpatient conservative management with antibiotics and multiple  surgeries, finally orthopedics Dr. 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  Antibiotics have been discontinued, since tolerating oral diet and in no distress on appearance have transitioned him to oral pain regimen, advance activity may require placement, PT on board.  Stable will be discharged on 09/18/2018 to inpatient rehab.  2.  History of CAD with stent.  On aspirin-Plavix, statin and beta-blocker.  Chest pain-free, EKG & echo stable.  3.  History of CVA.  Aspirin-Plavix and statin to be continued for secondary prevention.  4.  Morbid obesity.  Follow with PCP for weight loss.  5.  Chronic pain.  Supportive care.  Has a #1 above.  6.  Panic Attacks - Stable , chronic problem, added Klonopin with good effect.  7.  Constipation.  Placed on aggressive bowel regimen, monitor and adjust.  8. DM type II.  On Levemir along with pre-meal NovoLog, please monitor CBGs QA CHS.  Please add additional moderate to high  dose sliding scale as needed.   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)   HTN (hypertension)   CAD (coronary artery disease), native coronary artery   Cellulitis and abscess of foot   Charcot foot due to diabetes mellitus ([place])   Severe protein-calorie malnutrition (HCC)   Post-operative pain   Acute blood loss anemia    Discharge instructions    Discharge Instructions    Ambulatory referral to Nutrition and Diabetic Education   Complete by:  As directed    Discharge instructions   Complete by:  As directed    Follow with Primary MD Martinique, Sarah T, MD in 7 days   Activity: As tolerated with Full fall precautions use walker/cane & assistance as needed  Disposition Rehab  Diet: Heart Healthy Low Carb, check CBGs QAC-HS.   Special Instructions: If you have smoked or chewed Tobacco  in the last 2 yrs please  stop smoking, stop any regular Alcohol  and or any Recreational drug use.  On your next visit with your primary care physician please Get Medicines reviewed and adjusted.  Please request your Prim.MD to go over all Hospital Tests and Procedure/Radiological results at the follow up, please get all Hospital records sent to your Prim MD by signing hospital release before you go home.  If you experience worsening of your admission symptoms, develop shortness of breath, life threatening emergency, suicidal or homicidal thoughts you must seek medical attention immediately by calling 911 or calling your MD immediately  if symptoms less severe.  You Must read complete instructions/literature along with all the possible adverse reactions/side effects for all the Medicines you take and that have been prescribed to you. Take any new Medicines after you have completely understood and accpet all the possible adverse reactions/side effects.   Increase activity slowly   Complete by:  As directed    Negative Pressure Wound Therapy - Incisional   Complete by:  As directed    Change from the hospital wound VAC pump to the portable Praveena pump for discharge.  Plug unit into the Ensure it  stays charged.      Discharge Medications   Allergies as of 09/17/2018      Reactions   Magnesium-containing Compounds Anaphylaxis   Sulfamethoxazole-trimethoprim Swelling, Anaphylaxis   "tongue swelling" "tongue swelling" Pt states tongue swells   Ibuprofen Other (See Comments)   Kidney issues Kidney issues Shuts kidneys down Kidney issues Shuts kidneys down   Pregabalin Swelling      Medication List    STOP taking these medications   NOVOLOG FLEXPEN 100 UNIT/ML FlexPen Generic drug:  insulin aspart Replaced by:  insulin aspart 100 UNIT/ML injection   TRESIBA FLEXTOUCH 200 UNIT/ML Sopn Generic drug:  Insulin Degludec   VICTOZA 18 MG/3ML Sopn Generic drug:  liraglutide     TAKE these medications     amLODipine 10 MG tablet Commonly known as:  NORVASC Take 10 mg by mouth daily.   aspirin 325 MG tablet Take 325 mg by mouth daily.   citalopram 20 MG tablet Commonly known as:  CELEXA Take 20 mg by mouth daily.   clonazePAM 0.5 MG disintegrating tablet Commonly known as:  KLONOPIN Take 1 tablet (0.5 mg total) by mouth 2 (two) times daily.   clopidogrel 75 MG tablet Commonly known as:  PLAVIX Take 75 mg by mouth daily.   cyclobenzaprine 10 MG tablet  Commonly known as:  FLEXERIL Take 10 mg by mouth daily.   docusate sodium 100 MG capsule Commonly known as:  COLACE Take 1 capsule (100 mg total) by mouth 2 (two) times daily.   furosemide 20 MG tablet Commonly known as:  LASIX Take 20 mg by mouth daily.   gabapentin 300 MG capsule Commonly known as:  NEURONTIN Take 600 mg by mouth 2 (two) times daily.   gemfibrozil 600 MG tablet Commonly known as:  LOPID Take 1,200 mg by mouth 2 (two) times daily before a meal.   gentamicin ointment 0.1 % Commonly known as:  GARAMYCIN Apply to wounds with each dressing change   hydrOXYzine 10 MG tablet Commonly known as:  ATARAX/VISTARIL Take 10 mg by mouth daily.   insulin aspart 100 UNIT/ML injection Commonly known as:  novoLOG Inject 20 Units into the skin 3 (three) times daily with meals. Replaces:  NOVOLOG FLEXPEN 100 UNIT/ML FlexPen   insulin detemir 100 UNIT/ML injection Commonly known as:  LEVEMIR Inject 0.75 mLs (75 Units total) into the skin 2 (two) times daily. What changed:  how much to take   isosorbide mononitrate 60 MG 24 hr tablet Commonly known as:  IMDUR Take 60 mg by mouth daily.   metoprolol tartrate 100 MG tablet Commonly known as:  LOPRESSOR Take 100 mg by mouth 2 (two) times daily.   NUCYNTA 100 MG Tabs Generic drug:  Tapentadol HCl Take 100 mg by mouth every 6 (six) hours as needed (chronic pain).   omeprazole 20 MG capsule Commonly known as:  PRILOSEC Take 20 mg by mouth daily.   oxyCODONE  5 MG immediate release tablet Commonly known as:  Oxy IR/ROXICODONE Take 1-2 tablets (5-10 mg total) by mouth every 4 (four) hours as needed for moderate pain (pain score 4-6).   polyethylene glycol packet Commonly known as:  MIRALAX / GLYCOLAX Take 17 g by mouth 2 (two) times daily.   pravastatin 20 MG tablet Commonly known as:  PRAVACHOL Take 20 mg by mouth daily.       Follow-up Information    Newt Minion, MD. Schedule an appointment as soon as possible for a visit in 1 week(s).   Specialty:  Orthopedic Surgery Contact information: [place] [address] [phone]           Major procedures and Radiology Reports - PLEASE review detailed and final reports thoroughly  -      L- BKA by DR Sharol Given 09/14/18   TTE -   - Left ventricle: The cavity size was normal. There was moderateconcentric hypertrophy. Systolic function was normal. The estimated ejection fraction was in the range of 50% to 55%. Wall motion was normal; there were no regional wall motion abnormalities. The study is indeterminate for the evaluation of LV diastolic function. - Aortic valve: Trileaflet; mildly thickened, mildly calcified leaflets. There was no regurgitation. - Mitral valve: Calcified annulus. There was trivial regurgitation. - Right ventricle: Systolic function was normal. - Atrial septum: No defect or patent foramen ovale was identified.     No results found.  Micro Results    Recent Results (from the past 240 hour(s))  Surgical pcr screen     Status: None   Collection Time: 09/13/18  5:50 PM  Result Value Ref Range Status   MRSA, PCR NEGATIVE NEGATIVE Final   Staphylococcus aureus NEGATIVE NEGATIVE Final    Comment: (NOTE) The Xpert SA Assay (FDA approved for NASAL specimens in patients 22 years  of age and older), is one component of a comprehensive surveillance program. It is not intended to diagnose infection nor to guide or monitor  treatment. Performed at [facility], [place] [address] [place], [place] [address]     Today   Subjective    Bliss Tsang today has no headache,no chest abdominal pain,no new weakness tingling or numbness, feels much better     Objective   Blood pressure 140/84, pulse 92, temperature 98.1 F (36.7 C), temperature source Oral, resp. rate 16, height 6\' 5"  (1.956 m), weight (!) 178.4 kg, SpO2 97 %.   Intake/Output Summary (Last 24 hours) at 09/17/2018 1347 Last data filed at 09/16/2018 1529 Gross per 24 hour  Intake -  Output 425 ml  Net -425 ml    Exam Awake Alert, Oriented x 3, No new F.N deficits, Normal affect [place].AT,PERRAL Supple Neck,No JVD, No cervical lymphadenopathy appriciated.  Symmetrical Chest wall movement, Good air movement bilaterally, CTAB RRR,No Gallops,Rubs or new Murmurs, No Parasternal Heave +ve B.Sounds, Abd Soft, Non tender, No organomegaly appriciated, No rebound -guarding or rigidity. No Cyanosis, Clubbing or edema,  L.BKA with W Vac     Data Review   CBC w Diff:  Lab Results  Component Value Date   WBC 9.7 09/16/2018   HGB 10.8 (L) 09/16/2018   HCT 35.6 (L) 09/16/2018   PLT 388 09/16/2018   LYMPHOPCT 13 07/05/2014   MONOPCT 12 07/05/2014   EOSPCT 1 07/05/2014   BASOPCT 0 07/05/2014    CMP:  Lab Results  Component Value Date   NA 133 (L) 09/17/2018   K 3.9 09/17/2018   CL 95 (L) 09/17/2018   CO2 27 09/17/2018   BUN 9 09/17/2018   CREATININE 1.19 09/17/2018   PROT 6.2 07/10/2014   ALBUMIN 3.0 (L) 09/13/2018   BILITOT 0.4 07/10/2014   ALKPHOS 84 07/10/2014   AST 31 07/10/2014   ALT 49 07/10/2014  .   Total Time in preparing paper work, data evaluation and todays exam - 2 minutes  Lala Lund M.D on 09/17/2018 at 1:47 PM  [facility]   Office  [phone]

## 2018-09-17 NOTE — PMR Pre-admission (Signed)
PMR Admission Coordinator Pre-Admission Assessment  Patient: Kristopher Simon is an 53 y.o., male MRN: [id number] DOB: 12-14-1965 Height: 6\' 5"  (195.6 cm) Weight: (!) 178.4 kg              Insurance Information HMO:     PPO:      PCP:      IPA:      80/20:      OTHER:  PRIMARY: Medicaid [place]      Policy#: [id number]      Subscriber: pt Benefits:  Phone #: Passport one online     Name: 09/17/2018 Eff. Date: active    [place]  Medicaid Application Date:       Case Manager:  Disability Application Date:       Case Worker:   Emergency Contact Information Contact Information    Name Relation Home Work Kinnelon Sister [phone]     Daymian, Lill Mother [phone]       Current Medical History  Patient Admitting Diagnosis: Left BKA  History of Present Illness: Kristopher Simon is a 53 y.o. male with history of morbid obesity, T2DM--uncontrolled, HTN, CAD, left foot with charcot deformity, protein calorie malnutrition and chronic ulcer with increase in pain and edema.  He was admitted on 09/12/18 via [facility] with concerns of septic arthritis and chronic osteomyelitis. He was evaluated by Dr. Sharol Given who recommended amputation due to massive destruction of talus and calcaneous.  He was taken to OR on 01/24 for left transtibial amputation and post op wound VAC to continue for 1-2 weeks.  Blood sugars continue to be poorly controlled and he has had issue with panic attacks therefore Klonopin added.   Past Medical History  Past Medical History:  Diagnosis Date  . Acute renal failure ([place]) 01/2012  . Anxiety   . Cellulitis   . Concussion   . Coronary artery disease   . Diabetes mellitus 1995  . Fibromyalgia   . Hypertension 1998    Family History  family history includes Alzheimer's disease in his father; Dementia in his father; Hypertension in his mother.  Prior Rehab/Hospitalizations:  Has the patient had major surgery during 100 days prior to admission? No  Previous SNF  stay for 6 months at [facility] 2018  Current Medications   Current Facility-Administered Medications:  .  0.9 %  sodium chloride infusion, , Intravenous, Continuous, Newt Minion, MD, Last Rate: 10 mL/hr at 09/14/18 1353 .  acetaminophen (TYLENOL) tablet 325-650 mg, 325-650 mg, Oral, Q6H PRN, Newt Minion, MD, 650 mg at 09/16/18 0539 .  aspirin EC tablet 81 mg, 81 mg, Oral, Daily, Newt Minion, MD, 81 mg at 09/17/18 0957 .  bisacodyl (DULCOLAX) EC tablet 10 mg, 10 mg, Oral, Daily, Thurnell Lose, MD, 10 mg at 09/17/18 0956 .  clonazepam (KLONOPIN) disintegrating tablet 0.5 mg, 0.5 mg, Oral, BID, Thurnell Lose, MD, 0.5 mg at 09/17/18 0957 .  clopidogrel (PLAVIX) tablet 75 mg, 75 mg, Oral, Daily, Newt Minion, MD, 75 mg at 09/17/18 0957 .  docusate sodium (COLACE) capsule 100 mg, 100 mg, Oral, BID, Newt Minion, MD, 100 mg at 09/17/18 0957 .  enoxaparin (LOVENOX) injection 90 mg, 0.5 mg/kg, Subcutaneous, Q24H, Newt Minion, MD, 90 mg at 09/16/18 1725 .  gabapentin (NEURONTIN) capsule 300 mg, 300 mg, Oral, TID, Newt Minion, MD, 300 mg at 09/17/18 0957 .  hydrALAZINE (APRESOLINE) injection 10 mg, 10 mg, Intravenous, Q6H PRN, Sharol Given,  Illene Regulus, MD .  hydrALAZINE (APRESOLINE) tablet 25 mg, 25 mg, Oral, Q8H, Newt Minion, MD, 25 mg at 09/17/18 0416 .  hydrOXYzine (ATARAX/VISTARIL) tablet 10 mg, 10 mg, Oral, TID PRN, Newt Minion, MD, 10 mg at 09/13/18 2350 .  insulin aspart (novoLOG) injection 0-15 Units, 0-15 Units, Subcutaneous, TID WC, Newt Minion, MD, 11 Units at 09/17/18 1235 .  insulin aspart (novoLOG) injection 0-5 Units, 0-5 Units, Subcutaneous, QHS, Newt Minion, MD, 3 Units at 09/16/18 2156 .  insulin aspart (novoLOG) injection 20 Units, 20 Units, Subcutaneous, TID WC, Newt Minion, MD, 20 Units at 09/17/18 1236 .  insulin detemir (LEVEMIR) injection 72 Units, 72 Units, Subcutaneous, BID, Thurnell Lose, MD, 72 Units at 09/17/18 1141 .   magnesium citrate solution 1 Bottle, 1 Bottle, Oral, Once PRN, Newt Minion, MD .  magnesium hydroxide (MILK OF MAGNESIA) suspension 30 mL, 30 mL, Oral, BID, Thurnell Lose, MD, 30 mL at 09/17/18 1234 .  methocarbamol (ROBAXIN) tablet 500 mg, 500 mg, Oral, Q6H PRN, 500 mg at 09/15/18 0623 **OR** [DISCONTINUED] methocarbamol (ROBAXIN) 500 mg in dextrose 5 % 50 mL IVPB, 500 mg, Intravenous, Q6H PRN, Newt Minion, MD .  metoprolol tartrate (LOPRESSOR) injection 5 mg, 5 mg, Intravenous, Q6H PRN, Newt Minion, MD .  metoprolol tartrate (LOPRESSOR) tablet 50 mg, 50 mg, Oral, BID, Newt Minion, MD, 50 mg at 09/17/18 0956 .  morphine (MS CONTIN) 12 hr tablet 60 mg, 60 mg, Oral, Q12H, Thurnell Lose, MD, 60 mg at 09/17/18 0957 .  [DISCONTINUED] ondansetron (ZOFRAN) tablet 4 mg, 4 mg, Oral, Q6H PRN **OR** ondansetron (ZOFRAN) injection 4 mg, 4 mg, Intravenous, Q6H PRN, Newt Minion, MD .  oxyCODONE (Oxy IR/ROXICODONE) immediate release tablet 5-10 mg, 5-10 mg, Oral, Q4H PRN, Newt Minion, MD, 10 mg at 09/17/18 0416 .  pantoprazole (PROTONIX) EC tablet 40 mg, 40 mg, Oral, Q2200, Newt Minion, MD, 40 mg at 09/16/18 2145 .  polyethylene glycol (MIRALAX / GLYCOLAX) packet 17 g, 17 g, Oral, BID, Candiss Norse, Prashant K, MD .  saccharomyces boulardii (FLORASTOR) capsule 250 mg, 250 mg, Oral, BID, Newt Minion, MD, 250 mg at 09/17/18 0957  Patients Current Diet:  Diet Order            Diet Carb Modified Fluid consistency: Thin; Room service appropriate? Yes  Diet effective now              Precautions / Restrictions Precautions Precautions: Fall Restrictions Weight Bearing Restrictions: Yes LLE Weight Bearing: Non weight bearing   Has the patient had 2 or more falls or a fall with injury in the past year?No  Prior Activity Level Limited Community (1-2x/wk): limited mobility over past 6 months; Mod I using [place] / [place] Devices/Equipment:  Gilford Rile (specify type) Home Equipment: Walker - 2 wheels  Prior Device Use: Indicate devices/aids used by the patient prior to current illness, exacerbation or injury? Walker  Prior Functional Level Prior Function Level of Independence: Independent with assistive device(s) Comments: ambulates with RW  Self Care: Did the patient need help bathing, dressing, using the toilet or eating?  Independent  Indoor Mobility: Did the patient need assistance with walking from room to room (with or without device)? Independent  Stairs: Did the patient need assistance with internal or external stairs (with or without device)? Needed some help  Functional Cognition: Did the patient need help planning regular tasks  such as shopping or remembering to take medications? Needed some help  Current Functional Level Cognition  Overall Cognitive Status: History of cognitive impairments - at baseline Orientation Level: Oriented X4 General Comments: Hx of TBI    Extremity Assessment (includes Sensation/Coordination)  Upper Extremity Assessment: Overall WFL for tasks assessed  Lower Extremity Assessment: Defer to PT evaluation LLE Deficits / Details: wound VAC in place at distal aspect, pt with good knee ROM and able to achieve full knee extension LLE Sensation: decreased light touch    ADLs  Overall ADL's : Needs assistance/impaired Grooming: Set up, Sitting Upper Body Bathing: Set up, Sitting Lower Body Bathing: Moderate assistance, Bed level Upper Body Dressing : Set up, Sitting Lower Body Dressing: Moderate assistance, Bed level Functional mobility during ADLs: +2 for safety/equipment General ADL Comments: Began educating pt on compensatory techniques. Pt abl eto bridge and push through RLE while maintaining NWB status on LLE    Mobility  Overal bed mobility: Needs Assistance Bed Mobility: Supine to Sit Supine to sit: Min guard Sit to supine: Min guard General bed mobility comments: min guard  to come to EOB - verbal cueing for sequencing    Transfers  Overall transfer level: Needs assistance Equipment used: Rolling walker (2 wheeled) Transfers: Sit to/from Stand, [employer] Transfers Sit to Stand: Min guard Stand pivot transfers: Min assist, +2 safety/equipment General transfer comment: increased time and effort - education on hand placement and sequencing for safety and efficiency    Ambulation / Gait / Stairs / Wheelchair Mobility  Ambulation/Gait General Gait Details: pt able to hop x2 in place on R LE with min A x2 for safety and stability    Posture / Balance Dynamic Sitting Balance Sitting balance - Comments: pt able to sit EOB with supervision Balance Overall balance assessment: Needs assistance Sitting-balance support: Single extremity supported, Feet supported Sitting balance-Leahy Scale: Good Sitting balance - Comments: pt able to sit EOB with supervision Standing balance support: Bilateral upper extremity supported Standing balance-Leahy Scale: Poor Standing balance comment: reliant on bilateral UEs on RW    Special needs/care consideration BiPAP/CPAP n/a CPM n/a Continuous Drip IV n/a Dialysis n/a Life Vest n/a Oxygen n/a Special Bed Might Bed which pt prefers not to have Trach Size n/a Wound Vac Left surgical wound site Skin Left BKA surgical site; RLE 1 + edema; ecchymosis to BLE Bowel mgmt: LAST BM 1/26 Bladder mgmt: continent Diabetic mgmt Hgb A1c 10.1 09/13/2018; Patient states his endocrinologist is assessing him for use of insulin pump   Previous Home Environment Living Arrangements: Parent  Lives With: (Mom) Available Help at Discharge: Family, Available 24 hours/day Type of Home: House Home Layout: One level Home Access: Ramped entrance Bathroom Shower/Tub: Chiropodist: Standard Bathroom Accessibility: (was hopiing into bathroom off RW) Home Care Services: No  Discharge Living Setting Plans for Discharge Living  Setting: Patient's home, Lives with (comment)(Mom for past 2 months) Type of Home at Discharge: House Discharge Home Layout: One level Discharge Home Access: Ramped entrance Discharge Bathroom Shower/Tub: Tub/shower unit Discharge Bathroom Toilet: Standard Discharge Bathroom Accessibility: No Does the patient have any problems obtaining your medications?: No  Social/Family/Support Systems Contact Information: Mom. Sharion Settler Anticipated Caregiver: Mom and family Anticipated Caregiver's Contact Information: [phone] Caregiver Availability: 24/7 Discharge Plan Discussed with Primary Caregiver: Yes Is Caregiver In Agreement with Plan?: Yes Does Caregiver/Family have Issues with Lodging/Transportation while Pt is in Rehab?: No  Has been living with his Mom for 2 months. Prior  to that pt was living alone.  Goals/Additional Needs Patient/Family Goal for Rehab: Mod I PT and OT  Expected length of stay: ELOS 4 to 7 days Equipment Needs: Patietn asking about motorized wheelchair Pt/Family Agrees to Admission and willing to participate: Yes Program Orientation Provided & Reviewed with Pt/Caregiver Including Roles  & Responsibilities: Yes  Barriers to Discharge: Weight  Patient states he was working with his PCP and Josh at [place] care for authorization for an motorized wheelchair . Would like assistance to follow up on that request.  Decrease burden of Care through IP rehab admission: n/a  Possible need for SNF placement upon discharge: not anticipated  Patient Condition: This patient's condition remains as documented in the consult dated 09/17/2018, in which the Rehabilitation Physician determined and documented that the patient's condition is appropriate for intensive rehabilitative care in an inpatient rehabilitation facility. Will admit to inpatient rehab today.  Preadmission Screen Completed By:  Cleatrice Burke, 09/17/2018 12:52  PM ______________________________________________________________________   Discussed status with Dr. Posey Pronto on 09/17/2018 at  1259 and received telephone approval for admission today.  Admission Coordinator:  Cleatrice Burke, time 8413 Date 09/17/2018

## 2018-09-17 NOTE — Consult Note (Addendum)
Physical Medicine and Rehabilitation Consult   Reason for Consult: Functional deficits due to debility  Referring Physician:  Dr. Sharol Given   HPI: Kristopher Simon is a 53 y.o. male with history of morbid obesity, T2DM--uncontrolled, HTN, CAD, left foot with charcot deformity, protein calorie malnutrition and chronic ulcer with increase in pain and edema. History taken from chart review and patient. He was admitted on 09/12/18 via [facility] with concerns of septic arthritis and chronic osteomyelitis. He was evaluated by Dr. Sharol Given who recommended amputation due to massive destruction of talus and calcaneous.  He was taken to OR on 01/24 for left transtibial amputation and post op wound VAC to continue for 1-2 weeks.  Blood sugars continue to be poorly controlled and he has had issue with panic attacks therefore Klonopin added. Therapy evaluations done revealing functional deficits and CIR recommended for follow up therapy.    Review of Systems  Constitutional: Negative for chills and fever.  HENT: Negative for hearing loss and tinnitus.   Eyes: Negative for blurred vision and double vision.       Blind in left eye  Respiratory: Negative for cough and shortness of breath.   Cardiovascular: Negative for chest pain and palpitations.  Gastrointestinal: Positive for constipation. Negative for heartburn and nausea.  Genitourinary: Negative for dysuria and urgency.  Musculoskeletal: Positive for falls. Negative for back pain, myalgias and neck pain.  Skin: Negative for rash.  Neurological: Positive for sensory change (numbness/tingling right foot intermittently) and focal weakness.  Psychiatric/Behavioral: The patient does not have insomnia.   All other systems reviewed and are negative.     Past Medical History:  Diagnosis Date  . Acute renal failure ([place]) 01/2012  . Anxiety   . Cellulitis   . Concussion   . Coronary artery disease   . Diabetes mellitus 1995  . Fibromyalgia   . Hypertension 1998      Past Surgical History:  Procedure Laterality Date  . AMPUTATION Left 09/14/2018   Procedure: LEFT BELOW KNEE AMPUTATION;  Surgeon: Newt Minion, MD;  Location: [facility];  Service: Orthopedics;  Laterality: Left;  . CARDIAC CATHETERIZATION    . FRACTURE SURGERY    . HIP FRACTURE SURGERY      Family History  Problem Relation Age of Onset  . Hypertension Mother   . Dementia Father   . Alzheimer's disease Father     Social History:  Lives with mother. Independent with RW. Has been disabled since hip fracture 5 years ago but has had decline in mobility for past 6 months due to issues with left foot. He  reports that he has never smoked. He has never used smokeless tobacco. He reports current alcohol use of about 1.0 standard drinks of alcohol per week. He reports that he does not use drugs.    Allergies  Allergen Reactions  . Magnesium-Containing Compounds Anaphylaxis  . Sulfamethoxazole-Trimethoprim Swelling and Anaphylaxis    "tongue swelling" "tongue swelling" Pt states tongue swells  . Ibuprofen Other (See Comments)    Kidney issues Kidney issues Shuts kidneys down Kidney issues Shuts kidneys down  . Pregabalin Swelling   Facility-Administered Medications Prior to Admission  Medication Dose Route Frequency Provider Last Rate Last Dose  . triamcinolone acetonide (KENALOG) 10 MG/ML injection 10 mg  10 mg Other Once Landis Martins, DPM       Medications Prior to Admission  Medication Sig Dispense Refill  . amLODipine (NORVASC) 10 MG tablet Take 10 mg by mouth  daily.    . aspirin 325 MG tablet Take 325 mg by mouth daily.    . citalopram (CELEXA) 20 MG tablet Take 20 mg by mouth daily.    . clopidogrel (PLAVIX) 75 MG tablet Take 75 mg by mouth daily.    . cyclobenzaprine (FLEXERIL) 10 MG tablet Take 10 mg by mouth daily.     . furosemide (LASIX) 20 MG tablet Take 20 mg by mouth daily.  3  . gabapentin (NEURONTIN) 300 MG capsule Take 600 mg by mouth 2 (two) times daily.      Marland Kitchen gemfibrozil (LOPID) 600 MG tablet Take 1,200 mg by mouth 2 (two) times daily before a meal.    . gentamicin ointment (GARAMYCIN) 0.1 % Apply to wounds with each dressing change 15 g 0  . hydrOXYzine (ATARAX/VISTARIL) 10 MG tablet Take 10 mg by mouth daily.   0  . insulin aspart (NOVOLOG FLEXPEN) 100 UNIT/ML FlexPen Inject 60 Units into the skin See admin instructions. 3-4 times daily    . Insulin Degludec (TRESIBA FLEXTOUCH) 200 UNIT/ML SOPN Inject 154 Units into the skin daily.    . isosorbide mononitrate (IMDUR) 60 MG 24 hr tablet Take 60 mg by mouth daily.    . metoprolol (LOPRESSOR) 100 MG tablet Take 100 mg by mouth 2 (two) times daily.    . NUCYNTA 100 MG TABS Take 100 mg by mouth every 6 (six) hours as needed (chronic pain).   0  . omeprazole (PRILOSEC) 20 MG capsule Take 20 mg by mouth daily.    . pravastatin (PRAVACHOL) 20 MG tablet Take 20 mg by mouth daily.    Marland Kitchen VICTOZA 18 MG/3ML SOPN Inject 1.8 mg into the skin daily.   5    Home: Home Living Family/patient expects to be discharged to:: Private residence Living Arrangements: Parent Available Help at Discharge: Family, Available 24 hours/day Type of Home: House Home Access: [place]: One level Bathroom Shower/Tub: Chiropodist: Standard Bathroom Accessibility: No Home Equipment: Environmental consultant - 2 wheels  Functional History: Prior Function Level of Independence: Independent with assistive device(s) Comments: ambulates with RW Functional Status:  Mobility: Bed Mobility Overal bed mobility: Needs Assistance Bed Mobility: Supine to Sit Supine to sit: Min guard Sit to supine: Min guard General bed mobility comments: min guard to come to EOB - verbal cueing for sequencing Transfers Overall transfer level: Needs assistance Equipment used: Rolling walker (2 wheeled) Transfers: Sit to/from Stand, [employer] Transfers Sit to Stand: Min guard Stand pivot transfers: Min assist, +2  safety/equipment General transfer comment: increased time and effort - education on hand placement and sequencing for safety and efficiency Ambulation/Gait General Gait Details: pt able to hop x2 in place on R LE with min A x2 for safety and stability    ADL: ADL Overall ADL's : Needs assistance/impaired Grooming: Set up, Sitting Upper Body Bathing: Set up, Sitting Lower Body Bathing: Moderate assistance, Bed level Upper Body Dressing : Set up, Sitting Lower Body Dressing: Moderate assistance, Bed level Functional mobility during ADLs: +2 for safety/equipment General ADL Comments: Began educating pt on compensatory techniques. Pt abl eto bridge and push through RLE while maintaining NWB status on LLE  Cognition: Cognition Overall Cognitive Status: History of cognitive impairments - at baseline Orientation Level: Oriented X4 Cognition Arousal/Alertness: Awake/alert Behavior During Therapy: WFL for tasks assessed/performed Overall Cognitive Status: History of cognitive impairments - at baseline General Comments: Hx of TBI   Blood pressure 140/70, pulse 88, temperature  98.1 F (36.7 C), temperature source Oral, resp. rate 16, height 6\' 5"  (1.956 m), weight (!) 178.4 kg, SpO2 97 %. Physical Exam  Nursing note and vitals reviewed. Constitutional: He is oriented to person, place, and time. He appears well-developed.  Morbidly obese male  HENT:  Head: Normocephalic and atraumatic.  Eyes: EOM are normal. Right eye exhibits no discharge. Left eye exhibits no discharge.  Neck: Normal range of motion. Neck supple.  Cardiovascular: Normal rate and regular rhythm.  Respiratory: Effort normal and breath sounds normal.  GI: Soft. Bowel sounds are normal.  Musculoskeletal:        General: No edema.     Comments: Left BKA with compressive wrap and wound VAC in place.    Neurological: He is alert and oriented to person, place, and time.  Motor: B/l UE: 5/5 proximal to to distal RLE: 4+/5  (contralateral pain inhibition) LLE: HF: 4+/5 (pain inhibition) Sensation intact to light touch  Skin:  RLE multiple scabs on tibia and dry flaky skin with callused area lateral foot.   Psychiatric: He has a normal mood and affect. His behavior is normal. Thought content normal.    Results for orders placed or performed during the hospital encounter of 09/12/18 (from the past 24 hour(s))  Glucose, capillary     Status: Abnormal   Collection Time: 09/16/18 11:58 AM  Result Value Ref Range   Glucose-Capillary 284 (H) 70 - 99 mg/dL  Glucose, capillary     Status: Abnormal   Collection Time: 09/16/18  5:03 PM  Result Value Ref Range   Glucose-Capillary 253 (H) 70 - 99 mg/dL  Glucose, capillary     Status: Abnormal   Collection Time: 09/16/18  9:50 PM  Result Value Ref Range   Glucose-Capillary 262 (H) 70 - 99 mg/dL  Basic metabolic panel     Status: Abnormal   Collection Time: 09/17/18  4:03 AM  Result Value Ref Range   Sodium 133 (L) 135 - 145 mmol/L   Potassium 3.9 3.5 - 5.1 mmol/L   Chloride 95 (L) 98 - 111 mmol/L   CO2 27 22 - 32 mmol/L   Glucose, Bld 276 (H) 70 - 99 mg/dL   BUN 9 6 - 20 mg/dL   Creatinine, Ser 1.19 0.61 - 1.24 mg/dL   Calcium 9.0 8.9 - 10.3 mg/dL   GFR calc non Af Amer >60 >60 mL/min   GFR calc Af Amer >60 >60 mL/min   Anion gap 11 5 - 15  Glucose, capillary     Status: Abnormal   Collection Time: 09/17/18  8:21 AM  Result Value Ref Range   Glucose-Capillary 249 (H) 70 - 99 mg/dL   No results found.  Assessment/Plan: Diagnosis: Left BKA Labs independently reviewed.  Records reviewed and summated above. Clean amputation daily with soap and water Monitor incision site for signs of infection or impending skin breakdown. Staples to remain in place for 3-4 weeks Stump shrinker, for edema control  Scar mobilization massaging to prevent soft tissue adherence Stump protector during therapies Prevent flexion contractures by implementing the following:    Encourage prone lying for 20-30 mins per day BID to avoid hip flexion  Contractures if medically appropriate;  Avoid pillow under knees when patient is lying in bed in order to prevent both  knee and hip flexion contractures;  Avoid prolonged sitting Post surgical pain control with oral medication Phantom limb pain control with physical modalities including desensitization techniques (gentle self massage to the residual  stump,hot packs if sensation intact, [place]) and mirror therapy, TENS. If ineffective, consider pharmacological treatment for neuropathic pain (e.g gabapentin, pregabalin, amytriptalyine, duloxetine).  When using wheelchair, patient should have knee on amputated side fully extended with board under the seat cushion. Avoid injury to contralateral side  1. Does the need for close, 24 hr/day medical supervision in concert with the patient's rehab needs make it unreasonable for this patient to be served in a less intensive setting? Yes  2. Co-Morbidities requiring supervision/potential complications: morbid obesity (encourage weight loss), T2 DM (Monitor in accordance with exercise and adjust meds as necessary), HTN (monitor and provide prns in accordance with increased physical exertion and pain), CAD, protein calorie malnutrition, hyponatremia (cont to monitor, repeat labs, treat if necessary), ABLA (repeat labs, transfuse to ensure appropriate perfusion for increased activity tolerance), post-op pain (Biofeedback training with therapies to help reduce reliance on opiate pain medications, monitor pain control during therapies, and sedation at rest and titrate to maximum efficacy to ensure participation and gains in therapies) 3. Due to safety, skin/wound care, disease management, pain management and patient education, does the patient require 24 hr/day rehab nursing? Yes 4. Does the patient require coordinated care of a physician, rehab nurse, PT (1-2 hrs/day, 5 days/week) and OT (1-2  hrs/day, 5 days/week) to address physical and functional deficits in the context of the above medical diagnosis(es)? Yes Addressing deficits in the following areas: balance, endurance, locomotion, strength, transferring, bathing, dressing, toileting and psychosocial support 5. Can the patient actively participate in an intensive therapy program of at least 3 hrs of therapy per day at least 5 days per week? Yes 6. The potential for patient to make measurable gains while on inpatient rehab is excellent 7. Anticipated functional outcomes upon discharge from inpatient rehab are Mod I  with PT, Mod I with OT, n/a with SLP. 8. Estimated rehab length of stay to reach the above functional goals is: 4-7 days. 9. Anticipated D/C setting: Home 10. Anticipated post D/C treatments: HH therapy and Home excercise program 11. Overall Rehab/Functional Prognosis: excellent  RECOMMENDATIONS: This patient's condition is appropriate for continued rehabilitative care in the following setting: CIR Patient has agreed to participate in recommended program. Yes Note that insurance prior authorization may be required for reimbursement for recommended care.  Comment: Rehab Admissions Coordinator to follow up.   I have personally performed a face to face diagnostic evaluation, including, but not limited to relevant history and physical exam findings, of this patient and developed relevant assessment and plan.  Additionally, I have reviewed and concur with the physician assistant's documentation above.   Delice Lesch, MD, ABPMR Bary Leriche, PA-C 09/17/2018

## 2018-09-17 NOTE — Progress Notes (Signed)
Physical Therapy Treatment Patient Details Name: Kristopher Simon MRN: [id number] DOB: 1966-06-17 Today's Date: 09/17/2018    History of Present Illness Pt is a 53 y/o male s/p L transtibial amputation secondary to Charcot foot deformity. PMH including but not limited to CAD, HTN, DM, stroke and anxiety.    PT Comments     Continuing work on functional mobility and activity tolerance;  Noting much improved activity tolerance, good motivation to move more; Used RW to hop-step to bathroom; Throughout session, Kristopher Simon was anxious, at times apologizing for being angry, but he still participated well; We discussed perhaps a Chaplain visit to be able to talk about fears and anxieties, and he was agreeable; Kristopher Simon brought anxiety meds; Recommend coordinating anxiety meds with therapy sessions on CIR; Overall progressing well; Anticipate continuing good progress at post-acute rehabilitation.   Follow Up Recommendations  CIR     Equipment Recommendations  None recommended by PT    Recommendations for Other Services Rehab consult     Precautions / Restrictions Precautions Precautions: Fall Restrictions LLE Weight Bearing: Non weight bearing    Mobility  Bed Mobility                  Transfers Overall transfer level: Needs assistance Equipment used: Rolling walker (2 wheeled) Transfers: Sit to/from Stand Sit to Stand: Min guard         General transfer comment: increased time and effort - education on hand placement and sequencing for safety and efficiency  Ambulation/Gait Ambulation/Gait assistance: Min guard;+2 safety/equipment Gait Distance (Feet): 25 Feet(to and from bathroom; seated rest break on commode) Assistive device: Rolling walker (2 wheeled) Gait Pattern/deviations: (Hop-to pattern)     General Gait Details: Cues to self-monitor for activity tolerance; Hard footfalls and close guard for safety; fatigued with steps   Stairs             Wheelchair  Mobility    Modified Rankin (Stroke Patients Only)       Balance     Sitting balance-Leahy Scale: Good       Standing balance-Leahy Scale: Poor Standing balance comment: reliant on bilateral UEs on RW                            Cognition Arousal/Alertness: Awake/alert Behavior During Therapy: WFL for tasks assessed/performed Overall Cognitive Status: History of cognitive impairments - at baseline                                 General Comments: Hx of TBI      Exercises Amputee Exercises Quad Sets: AROM;Strengthening;Left;10 reps    General Comments        Pertinent Vitals/Pain Pain Assessment: Faces Faces Pain Scale: Hurts little more Pain Location: L residual limb Pain Descriptors / Indicators: Burning;Guarding Pain Intervention(s): Monitored during session    Home Living                      Prior Function            PT Goals (current goals can now be found in the care plan section) Acute Rehab PT Goals Patient Stated Goal: to get a prosthesis PT Goal Formulation: With patient/family Time For Goal Achievement: 09/28/18 Potential to Achieve Goals: Good Progress towards PT goals: Progressing toward goals    Frequency    Min 5X/week  PT Plan Current plan remains appropriate    Co-evaluation              AM-PAC PT "6 Clicks" Mobility   Outcome Measure  Help needed turning from your back to your side while in a flat bed without using bedrails?: None Help needed moving from lying on your back to sitting on the side of a flat bed without using bedrails?: None Help needed moving to and from a bed to a chair (including a wheelchair)?: A Lot Help needed standing up from a chair using your arms (e.g., wheelchair or bedside chair)?: A Little Help needed to walk in hospital room?: A Lot Help needed climbing 3-5 steps with a railing? : Total 6 Click Score: 16    End of Session Equipment Utilized During  Treatment: Gait belt Activity Tolerance: Patient tolerated treatment well Patient left: in chair;with call bell/phone within reach;with chair alarm set Nurse Communication: Mobility status PT Visit Diagnosis: Other abnormalities of gait and mobility (R26.89);Pain Pain - Right/Left: Left Pain - part of body: Leg     Time: 1337-1415(minus approx 5 minutes on the commode) PT Time Calculation (min) (ACUTE ONLY): 38 min  Charges:  $Gait Training: 8-22 mins $Therapeutic Activity: 8-22 mins                     Roney Marion, PT  Acute Rehabilitation Services Pager [phone] Office [place] 09/17/2018, 3:48 PM
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
## 2018-09-17 NOTE — IPOC Note (Signed)
Overall Plan of Care [facility]) Patient Details Name: Kristopher Simon MRN: [id number] DOB: 1965-11-10  Admitting Diagnosis: Right BKA  Hospital Problems: Active Problems:   Unilateral complete BKA, right, initial encounter ([place])   Unilateral complete BKA, right, subsequent encounter ([place])   Leukocytosis   Drug induced constipation   Diabetic peripheral neuropathy ([place])   Diabetes mellitus type 2 in obese (HCC)   Unilateral complete BKA, right, sequela ([place])   Panic disorder with agoraphobia   Generalized anxiety disorder     Functional Problem List: Nursing Bowel, Edema, Medication Management, Pain  PT Balance, Safety, Sensory, Skin Integrity, Endurance, Pain  OT Balance, Pain, Safety, Endurance, Sensory  SLP    TR         Basic ADL's: OT Grooming, Bathing, Dressing, Toileting     Advanced  ADL's: OT Simple Meal Preparation     Transfers: PT Car, Bed to Chair, Bed Mobility, Furniture  OT Toilet     Locomotion: PT Ambulation, Wheelchair Mobility     Additional Impairments: OT None  SLP        TR      Anticipated Outcomes Item Anticipated Outcome  Self Feeding Indep  Swallowing      Basic self-care  Supervision- mod I  Insurance underwriter Transfers Supervision  Bowel/Bladder  manage bowel with mod I assist  Transfers  mod I  Locomotion  mod I @ w/c level (S for gait)  Communication     Cognition     Pain  at or below level 3  Safety/Judgment      Therapy Plan: PT Intensity: Minimum of 1-2 x/day ,45 to 90 minutes PT Frequency: 5 out of 7 days PT Duration Estimated Length of Stay: 7-10 days OT Intensity: Minimum of 1-2 x/day, 45 to 90 minutes OT Frequency: 5 out of 7 days OT Duration/Estimated Length of Stay: 7-10 days      Team Interventions: Nursing Interventions Bowel Management, Pain Management, Patient/Family Education, Medication Management, Discharge Planning, Skin Care/Wound Management, Psychosocial Support  PT  interventions Ambulation/gait training, DME/adaptive equipment instruction, Neuromuscular re-education, Wheelchair propulsion/positioning, UE/LE Strength taining/ROM, Therapeutic Activities, Training and development officer, Functional mobility training, Patient/family education, Therapeutic Exercise  OT Interventions Training and development officer, Community reintegration, Disease mangement/prevention, Neuromuscular re-education, Patient/family education, Self Care/advanced ADL retraining, Splinting/orthotics, Therapeutic Exercise, UE/LE Coordination activities, Wheelchair propulsion/positioning, UE/LE Strength taining/ROM, Therapeutic Activities, Skin care/wound managment, Psychosocial support, Pain management, Functional mobility training, DME/adaptive equipment instruction, Discharge planning  SLP Interventions    TR Interventions    SW/CM Interventions Discharge Planning, Psychosocial Support, Patient/Family Education   Barriers to Discharge MD  Medical stability, Wound care and Weight bearing restrictions  Nursing      PT      OT      SLP      SW Decreased caregiver support Mom can't provide physical assist   Team Discharge Planning: Destination: PT-Home ,OT- Home , SLP-  Projected Follow-up: PT-Home health PT, Other (comment)(intermittent S), OT-  Home health OT, SLP-  Projected Equipment Needs: PT-Wheelchair (measurements), Wheelchair cushion (measurements), OT- To be determined, SLP-  Equipment Details: PT-22" w/c (need to see if will fit in home), amputee pad for L, OT-  Patient/family involved in discharge planning: PT- Patient,  OT-Patient, SLP-   MD ELOS: 7-10 days. Medical Rehab Prognosis:  Good Assessment: 53 year old male with history of T2DM poorly controlled, morbid obesity- BMI 46, HTN, CAD, left foot with Charcot deformity, protein calorie malnutrition and chronic ulcer left foot with  increasing pain and edema.  He was admitted on 09/12/2018 via [facility] with concerns  of septic arthritis and chronic osteomyelitis.  He was started on IV antibiotics and Dr. Sharol Given consulted for input.  Amputation recommended due to massive destruction of talus and calcaneus.  He was taken to the OR on 09/14/2018 for left transtibial amputation and postop wound VAC to continue for 1 to 2 weeks.  Patient has had issues with poorly controlled blood pressures as well as anxiety therefore Klonopin added for mood stabilization.  Patient with resulting functional deficits with mobility, transfers, self-care.  Will set goals for Mod I with PT for most tasks and Supervision with most tasks with OT.  See Team Conference Notes for weekly updates to the plan of care

## 2018-09-17 NOTE — Progress Notes (Signed)
Cristina Gong, RN  Rehab Admission Coordinator  Physical Medicine and Rehabilitation  PMR Pre-admission  Signed  Date of Service:  09/17/2018 12:51 PM       Related encounter: Admission (Discharged) from 09/12/2018 in [facility]         Show:Clear all [x] Manual[x] Template[x] Copied  Added by: [x] Cristina Gong, RN  [] Hover for details PMR Admission Coordinator Pre-Admission Assessment  Patient: Kristopher Simon is an 53 y.o., male MRN: [id number] DOB: September 24, 1965 Height: 6\' 5"  (195.6 cm) Weight: (!) 178.4 kg                                                                                                                                                  Insurance Information HMO:     PPO:      PCP:      IPA:      80/20:      OTHER:  PRIMARY: Medicaid [place]      Policy#: [id number]      Subscriber: pt Benefits:  Phone #: Passport one online     Name: 09/17/2018 Eff. Date: active    MADCY  Medicaid Application Date:       Case Manager:  Disability Application Date:       Case Worker:   Emergency Contact Information         Contact Information    Name Relation Home Work Mobile   Martin,Sherry Sister [phone]     Jubal, Rademaker Mother [phone]       Current Medical History  Patient Admitting Diagnosis: Left BKA  History of Present Illness:Kristopher Gradyis a 53 y.o.malewith history ofmorbid obesity, T2DM--uncontrolled, HTN, CAD, left foot with charcot deformity, protein calorie malnutrition and chronic ulcer with increase in pain and edema.He was admitted on 09/12/18 via [facility] with concerns of septic arthritis and chronic osteomyelitis. He was evaluated by Dr. Sharol Given who recommended amputation due to massive destruction of talus and calcaneous. He was taken to OR on 01/24 for left transtibial amputation and post op wound VAC to continue for 1-2 weeks. Blood sugars continue to be poorly controlled and he has had  issue with panic attacks therefore Klonopin added.   Past Medical History      Past Medical History:  Diagnosis Date  . Acute renal failure ([place]) 01/2012  . Anxiety   . Cellulitis   . Concussion   . Coronary artery disease   . Diabetes mellitus 1995  . Fibromyalgia   . Hypertension 1998    Family History  family history includes Alzheimer's disease in his father; Dementia in his father; Hypertension in his mother.  Prior Rehab/Hospitalizations:  Has the patient had major surgery during 100 days prior to admission? No  Previous SNF stay for 6 months at [facility] 2018  Current Medications   Current Facility-Administered Medications:  .  0.9 %  sodium chloride infusion, , Intravenous, Continuous, Newt Minion, MD, Last Rate: 10 mL/hr at 09/14/18 1353 .  acetaminophen (TYLENOL) tablet 325-650 mg, 325-650 mg, Oral, Q6H PRN, Newt Minion, MD, 650 mg at 09/16/18 0539 .  aspirin EC tablet 81 mg, 81 mg, Oral, Daily, Newt Minion, MD, 81 mg at 09/17/18 0957 .  bisacodyl (DULCOLAX) EC tablet 10 mg, 10 mg, Oral, Daily, Thurnell Lose, MD, 10 mg at 09/17/18 0956 .  clonazepam (KLONOPIN) disintegrating tablet 0.5 mg, 0.5 mg, Oral, BID, Thurnell Lose, MD, 0.5 mg at 09/17/18 0957 .  clopidogrel (PLAVIX) tablet 75 mg, 75 mg, Oral, Daily, Newt Minion, MD, 75 mg at 09/17/18 0957 .  docusate sodium (COLACE) capsule 100 mg, 100 mg, Oral, BID, Newt Minion, MD, 100 mg at 09/17/18 0957 .  enoxaparin (LOVENOX) injection 90 mg, 0.5 mg/kg, Subcutaneous, Q24H, Newt Minion, MD, 90 mg at 09/16/18 1725 .  gabapentin (NEURONTIN) capsule 300 mg, 300 mg, Oral, TID, Newt Minion, MD, 300 mg at 09/17/18 0957 .  hydrALAZINE (APRESOLINE) injection 10 mg, 10 mg, Intravenous, Q6H PRN, Newt Minion, MD .  hydrALAZINE (APRESOLINE) tablet 25 mg, 25 mg, Oral, Q8H, Newt Minion, MD, 25 mg at 09/17/18 0416 .  hydrOXYzine (ATARAX/VISTARIL) tablet 10 mg, 10 mg, Oral, TID  PRN, Newt Minion, MD, 10 mg at 09/13/18 2350 .  insulin aspart (novoLOG) injection 0-15 Units, 0-15 Units, Subcutaneous, TID WC, Newt Minion, MD, 11 Units at 09/17/18 1235 .  insulin aspart (novoLOG) injection 0-5 Units, 0-5 Units, Subcutaneous, QHS, Newt Minion, MD, 3 Units at 09/16/18 2156 .  insulin aspart (novoLOG) injection 20 Units, 20 Units, Subcutaneous, TID WC, Newt Minion, MD, 20 Units at 09/17/18 1236 .  insulin detemir (LEVEMIR) injection 72 Units, 72 Units, Subcutaneous, BID, Thurnell Lose, MD, 72 Units at 09/17/18 1141 .  magnesium citrate solution 1 Bottle, 1 Bottle, Oral, Once PRN, Newt Minion, MD .  magnesium hydroxide (MILK OF MAGNESIA) suspension 30 mL, 30 mL, Oral, BID, Thurnell Lose, MD, 30 mL at 09/17/18 1234 .  methocarbamol (ROBAXIN) tablet 500 mg, 500 mg, Oral, Q6H PRN, 500 mg at 09/15/18 0623 **OR** [DISCONTINUED] methocarbamol (ROBAXIN) 500 mg in dextrose 5 % 50 mL IVPB, 500 mg, Intravenous, Q6H PRN, Newt Minion, MD .  metoprolol tartrate (LOPRESSOR) injection 5 mg, 5 mg, Intravenous, Q6H PRN, Newt Minion, MD .  metoprolol tartrate (LOPRESSOR) tablet 50 mg, 50 mg, Oral, BID, Newt Minion, MD, 50 mg at 09/17/18 0956 .  morphine (MS CONTIN) 12 hr tablet 60 mg, 60 mg, Oral, Q12H, Thurnell Lose, MD, 60 mg at 09/17/18 0957 .  [DISCONTINUED] ondansetron (ZOFRAN) tablet 4 mg, 4 mg, Oral, Q6H PRN **OR** ondansetron (ZOFRAN) injection 4 mg, 4 mg, Intravenous, Q6H PRN, Newt Minion, MD .  oxyCODONE (Oxy IR/ROXICODONE) immediate release tablet 5-10 mg, 5-10 mg, Oral, Q4H PRN, Newt Minion, MD, 10 mg at 09/17/18 0416 .  pantoprazole (PROTONIX) EC tablet 40 mg, 40 mg, Oral, Q2200, Newt Minion, MD, 40 mg at 09/16/18 2145 .  polyethylene glycol (MIRALAX / GLYCOLAX) packet 17 g, 17 g, Oral, BID, Candiss Norse, Margaree Mackintosh, MD .  saccharomyces boulardii (FLORASTOR) capsule 250 mg, 250 mg, Oral, BID, Newt Minion, MD, 250 mg at 09/17/18 0957  Patients  Current Diet:     Diet Order  Diet Carb Modified Fluid consistency: Thin; Room service appropriate? Yes  Diet effective now               Precautions / Restrictions Precautions Precautions: Fall Restrictions Weight Bearing Restrictions: Yes LLE Weight Bearing: Non weight bearing   Has the patient had 2 or more falls or a fall with injury in the past year?No  Prior Activity Level Limited Community (1-2x/wk): limited mobility over past 6 months; Mod I using [place] / [place] Devices/Equipment: Gilford Rile (specify type) Home Equipment: Walker - 2 wheels  Prior Device Use: Indicate devices/aids used by the patient prior to current illness, exacerbation or injury? Walker  Prior Functional Level Prior Function Level of Independence: Independent with assistive device(s) Comments: ambulates with RW  Self Care: Did the patient need help bathing, dressing, using the toilet or eating?  Independent  Indoor Mobility: Did the patient need assistance with walking from room to room (with or without device)? Independent  Stairs: Did the patient need assistance with internal or external stairs (with or without device)? Needed some help  Functional Cognition: Did the patient need help planning regular tasks such as shopping or remembering to take medications? Needed some help  Current Functional Level Cognition  Overall Cognitive Status: History of cognitive impairments - at baseline Orientation Level: Oriented X4 General Comments: Hx of TBI    Extremity Assessment (includes Sensation/Coordination)  Upper Extremity Assessment: Overall WFL for tasks assessed  Lower Extremity Assessment: Defer to PT evaluation LLE Deficits / Details: wound VAC in place at distal aspect, pt with good knee ROM and able to achieve full knee extension LLE Sensation: decreased light touch    ADLs  Overall ADL's : Needs  assistance/impaired Grooming: Set up, Sitting Upper Body Bathing: Set up, Sitting Lower Body Bathing: Moderate assistance, Bed level Upper Body Dressing : Set up, Sitting Lower Body Dressing: Moderate assistance, Bed level Functional mobility during ADLs: +2 for safety/equipment General ADL Comments: Began educating pt on compensatory techniques. Pt abl eto bridge and push through RLE while maintaining NWB status on LLE    Mobility  Overal bed mobility: Needs Assistance Bed Mobility: Supine to Sit Supine to sit: Min guard Sit to supine: Min guard General bed mobility comments: min guard to come to EOB - verbal cueing for sequencing    Transfers  Overall transfer level: Needs assistance Equipment used: Rolling walker (2 wheeled) Transfers: Sit to/from Stand, [employer] Transfers Sit to Stand: Min guard Stand pivot transfers: Min assist, +2 safety/equipment General transfer comment: increased time and effort - education on hand placement and sequencing for safety and efficiency    Ambulation / Gait / Stairs / Wheelchair Mobility  Ambulation/Gait General Gait Details: pt able to hop x2 in place on R LE with min A x2 for safety and stability    Posture / Balance Dynamic Sitting Balance Sitting balance - Comments: pt able to sit EOB with supervision Balance Overall balance assessment: Needs assistance Sitting-balance support: Single extremity supported, Feet supported Sitting balance-Leahy Scale: Good Sitting balance - Comments: pt able to sit EOB with supervision Standing balance support: Bilateral upper extremity supported Standing balance-Leahy Scale: Poor Standing balance comment: reliant on bilateral UEs on RW    Special needs/care consideration BiPAP/CPAP n/a CPM n/a Continuous Drip IV n/a Dialysis n/a Life Vest n/a Oxygen n/a Special Bed Might Bed which pt prefers not to have Trach Size n/a Wound Vac Left surgical wound site Skin Left BKA surgical site;  RLE  1 + edema; ecchymosis to BLE Bowel mgmt: LAST BM 1/26 Bladder mgmt: continent Diabetic mgmt Hgb A1c 10.1 09/13/2018; Patient states his endocrinologist is assessing him for use of insulin pump   Previous Home Environment Living Arrangements: Parent  Lives With: (Mom) Available Help at Discharge: Family, Available 24 hours/day Type of Home: House Home Layout: One level Home Access: Ramped entrance Bathroom Shower/Tub: Chiropodist: Standard Bathroom Accessibility: (was hopiing into bathroom off RW) [place]: No  Discharge Living Setting Plans for Discharge Living Setting: Patient's home, Lives with (comment)(Mom for past 2 months) Type of Home at Discharge: House Discharge Home Layout: One level Discharge Home Access: [place] entrance Discharge Bathroom Shower/Tub: Tub/shower unit Discharge Bathroom Toilet: Standard Discharge Bathroom Accessibility: No Does the patient have any problems obtaining your medications?: No  Social/Family/Support Systems Contact Information: Mom. Sharion Settler Anticipated Caregiver: Mom and family Anticipated Caregiver's Contact Information: [phone] Caregiver Availability: 24/7 Discharge Plan Discussed with Primary Caregiver: Yes Is Caregiver In Agreement with Plan?: Yes Does Caregiver/Family have Issues with Lodging/Transportation while Pt is in Rehab?: No  Has been living with his Mom for 2 months. Prior to that pt was living alone.  Goals/Additional Needs Patient/Family Goal for Rehab: Mod I PT and OT  Expected length of stay: ELOS 4 to 7 days Equipment Needs: Patietn asking about motorized wheelchair Pt/Family Agrees to Admission and willing to participate: Yes Program Orientation Provided & Reviewed with Pt/Caregiver Including Roles  & Responsibilities: Yes  Barriers to Discharge: Weight  Patient states he was working with his PCP and Josh at [place] care for authorization for an motorized  wheelchair . Would like assistance to follow up on that request.  Decrease burden of Care through IP rehab admission: n/a  Possible need for SNF placement upon discharge: not anticipated  Patient Condition: This patient's condition remains as documented in the consult dated 09/17/2018, in which the Rehabilitation Physician determined and documented that the patient's condition is appropriate for intensive rehabilitative care in an inpatient rehabilitation facility. Will admit to inpatient rehab today.  Preadmission Screen Completed By:  Cleatrice Burke, 09/17/2018 12:52 PM ______________________________________________________________________   Discussed status with Dr. Posey Pronto on 09/17/2018 at  1259 and received telephone approval for admission today.  Admission Coordinator:  Cleatrice Burke, time 1638 Date 09/17/2018           Cosigned by: [name], MD at 09/17/2018 1:06 PM  Revision History

## 2018-09-18 ENCOUNTER — Inpatient Hospital Stay (HOSPITAL_COMMUNITY): Payer: Medicaid Other | Admitting: Physical Therapy

## 2018-09-18 ENCOUNTER — Inpatient Hospital Stay (HOSPITAL_COMMUNITY): Payer: Medicaid Other | Admitting: Occupational Therapy

## 2018-09-18 ENCOUNTER — Inpatient Hospital Stay (HOSPITAL_COMMUNITY): Payer: Medicaid Other

## 2018-09-18 DIAGNOSIS — E871 Hypo-osmolality and hyponatremia: Secondary | ICD-10-CM

## 2018-09-18 DIAGNOSIS — I10 Essential (primary) hypertension: Secondary | ICD-10-CM

## 2018-09-18 DIAGNOSIS — D62 Acute posthemorrhagic anemia: Secondary | ICD-10-CM

## 2018-09-18 DIAGNOSIS — E1169 Type 2 diabetes mellitus with other specified complication: Secondary | ICD-10-CM

## 2018-09-18 DIAGNOSIS — S88111D Complete traumatic amputation at level between knee and ankle, right lower leg, subsequent encounter: Secondary | ICD-10-CM

## 2018-09-18 DIAGNOSIS — D72829 Elevated white blood cell count, unspecified: Secondary | ICD-10-CM

## 2018-09-18 DIAGNOSIS — K5903 Drug induced constipation: Secondary | ICD-10-CM

## 2018-09-18 DIAGNOSIS — E669 Obesity, unspecified: Secondary | ICD-10-CM

## 2018-09-18 DIAGNOSIS — E1142 Type 2 diabetes mellitus with diabetic polyneuropathy: Secondary | ICD-10-CM

## 2018-09-18 LAB — CBC WITH DIFFERENTIAL/PLATELET
Abs Immature Granulocytes: 0.15 10*3/uL — ABNORMAL HIGH (ref 0.00–0.07)
Basophils Absolute: 0.1 10*3/uL (ref 0.0–0.1)
Basophils Relative: 1 %
Eosinophils Absolute: 0.6 10*3/uL — ABNORMAL HIGH (ref 0.0–0.5)
Eosinophils Relative: 6 %
HCT: 34.9 % — ABNORMAL LOW (ref 39.0–52.0)
Hemoglobin: 10.6 g/dL — ABNORMAL LOW (ref 13.0–17.0)
IMMATURE GRANULOCYTES: 1 %
Lymphocytes Relative: 17 %
Lymphs Abs: 1.8 10*3/uL (ref 0.7–4.0)
MCH: 26.2 pg (ref 26.0–34.0)
MCHC: 30.4 g/dL (ref 30.0–36.0)
MCV: 86.2 fL (ref 80.0–100.0)
Monocytes Absolute: 1 10*3/uL (ref 0.1–1.0)
Monocytes Relative: 9 %
NEUTROS PCT: 66 %
Neutro Abs: 7.2 10*3/uL (ref 1.7–7.7)
Platelets: 447 10*3/uL — ABNORMAL HIGH (ref 150–400)
RBC: 4.05 MIL/uL — ABNORMAL LOW (ref 4.22–5.81)
RDW: 16.4 % — ABNORMAL HIGH (ref 11.5–15.5)
WBC: 10.8 10*3/uL — ABNORMAL HIGH (ref 4.0–10.5)
nRBC: 0 % (ref 0.0–0.2)

## 2018-09-18 LAB — MAGNESIUM: Magnesium: 2 mg/dL (ref 1.7–2.4)

## 2018-09-18 LAB — COMPREHENSIVE METABOLIC PANEL
ALT: 14 U/L (ref 0–44)
AST: 20 U/L (ref 15–41)
Albumin: 2.8 g/dL — ABNORMAL LOW (ref 3.5–5.0)
Alkaline Phosphatase: 72 U/L (ref 38–126)
Anion gap: 11 (ref 5–15)
BUN: 14 mg/dL (ref 6–20)
CHLORIDE: 94 mmol/L — AB (ref 98–111)
CO2: 31 mmol/L (ref 22–32)
Calcium: 9.2 mg/dL (ref 8.9–10.3)
Creatinine, Ser: 1.12 mg/dL (ref 0.61–1.24)
GFR calc Af Amer: >60 mL/min (ref >60)
GFR calc non Af Amer: >60 mL/min (ref >60)
Glucose, Bld: 227 mg/dL — ABNORMAL HIGH (ref 70–99)
POTASSIUM: 4.4 mmol/L (ref 3.5–5.1)
Sodium: 136 mmol/L (ref 135–145)
Total Bilirubin: 0.7 mg/dL (ref 0.3–1.2)
Total Protein: 7.1 g/dL (ref 6.5–8.1)

## 2018-09-18 LAB — GLUCOSE, CAPILLARY
GLUCOSE-CAPILLARY: 101 mg/dL — AB (ref 70–99)
GLUCOSE-CAPILLARY: 158 mg/dL — AB (ref 70–99)
Glucose-Capillary: 221 mg/dL — ABNORMAL HIGH (ref 70–99)
Glucose-Capillary: 272 mg/dL — ABNORMAL HIGH (ref 70–99)

## 2018-09-18 MED ORDER — CITALOPRAM HYDROBROMIDE 10 MG PO TABS
20.0000 mg | ORAL_TABLET | Freq: Every day | ORAL | Status: DC
  Administered 2018-09-18 – 2018-09-19 (×2): 20 mg via ORAL
  Filled 2018-09-18 (×2): qty 2

## 2018-09-18 MED ORDER — CLONAZEPAM 0.25 MG PO TBDP
0.5000 mg | ORAL_TABLET | Freq: Two times a day (BID) | ORAL | Status: DC | PRN
  Administered 2018-09-18 – 2018-09-21 (×5): 0.5 mg via ORAL
  Filled 2018-09-18 (×5): qty 2
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
## 2018-09-18 NOTE — Progress Notes (Signed)
Patient reporting issues with anxiety. Wanted klonopin made prn--used atarax prn at home. Will resume home dose Celexa to help with mood stabilization.

## 2018-09-18 NOTE — Progress Notes (Signed)
Physical Therapy Session Note  Patient Details  Name: Fransico Sciandra MRN: [id number] Date of Birth: 1966-06-16  Today's Date: 09/18/2018 PT Individual Time: 1400-1510 PT Individual Time Calculation (min): 70 min   Short Term Goals: Week 1:  PT Short Term Goal 1 (Week 1): STG=LTG due to ELOS  Skilled Therapeutic Interventions/Progress Updates:   Pt in w/c and agreeable to therapy, 3/10 in L residual limb. Pt self-propelled w/c around unit w/ supervision using BUEs in multiple >150' bouts to work on Engineer, production. Educated pt on w/c propulsion around unit outside of therapy to help w/ his anxiety and w/ endurance training. Pt verbalized understanding that he needs to let his nurse know and have chair alarm engaged when doing so. Ambulated 1' and 15' using RW w/ min assist to [facility] w/ w/c follow for safety. Performed bed mobility on mat w/ supervision including sit<>supine and rolling R/L. Amputee education regarding residual limb care, maintaining adequate ROM, and desensitization techniques. Pt voiced anxiety regarding his new amputation, states he has panic attacks about it on a regular basis. Pt agreeable to be visited by amputee peer support while on CIR. Instructed pt on LLE LAQs in seated to maintain knee ROM. Pt performed w/o pain, 3x10 reps. Arm ergometer in seated @ level 2.0, 3 min forward and 3 min backward. Returned to room and ended session in recliner, all needs in reach.   Therapy Documentation Precautions:  Precautions Precautions: Fall Restrictions Weight Bearing Restrictions: Yes LLE Weight Bearing: Non weight bearing  Therapy/Group: Individual Therapy  Keniah Klemmer Clent Demark 09/18/2018, 3:40 PM
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
## 2018-09-18 NOTE — Progress Notes (Signed)
[facility] PHYSICAL MEDICINE & REHABILITATION PROGRESS NOTE  Subjective/Complaints: Patient seen laying in bed this morning.  He states he slept extremely well overnight.  He asks if his Klonopin can be changed to as needed.  He feels like he may have a bowel movement this morning.  ROS: Denies CP, shortness of breath, nausea, vomiting, diarrhea.  Objective: Vital Signs: Blood pressure (!) 158/95, pulse 87, temperature 97.7 F (36.5 C), temperature source Oral, resp. rate 17, height 6\' 5"  (1.956 m), SpO2 93 %. No results found. Recent Labs    09/16/18 0342 09/18/18 0530  WBC 9.7 10.8*  HGB 10.8* 10.6*  HCT 35.6* 34.9*  PLT 388 447*   Recent Labs    09/17/18 0403 09/18/18 0530  NA 133* 136  K 3.9 4.4  CL 95* 94*  CO2 27 31  GLUCOSE 276* 227*  BUN 9 14  CREATININE 1.19 1.12  CALCIUM 9.0 9.2    Physical Exam: BP (!) 158/95 (BP Location: Right Arm)   Pulse 87   Temp 97.7 F (36.5 C) (Oral)   Resp 17   Ht 6\' 5"  (1.956 m)   SpO2 93%   BMI 46.64 kg/m  Constitutional: No distress . Vital signs reviewed. HENT: Normocephalic.  Atraumatic. Eyes: EOMI. No discharge. Cardiovascular: RRR. No JVD. Respiratory: CTA Bilaterally. Normal effort. GI: BS +. Non-distended. Musc: No edema or tenderness in extremities. Musculoskeletal: L-BKA with compressive coban dressing and wound VAC in place. Neurological: He is alert and oriented to person, place, and time.  Motor: B/l UE: 5/5 proximal to to distal RLE: 4+/5 (contralateral pain inhibition), stable LLE: HF: 4+/5 (pain inhibition), stable Skin:  RLE multiple scabs on tibia and dry flaky skin with callused area lateral foot  R- foot with dry flaky skin.   Callused ulcer right lateral foot.   Psychiatric: He has a normal mood and affect. His behavior is normal.   Assessment/Plan: 1. Functional deficits secondary to left BKA which require 3+ hours per day of interdisciplinary therapy in a comprehensive inpatient rehab  setting.  Physiatrist is providing close team supervision and 24 hour management of active medical problems listed below.  Physiatrist and rehab team continue to assess barriers to discharge/monitor patient progress toward functional and medical goals  Care Tool:  Bathing              Bathing assist       Upper Body Dressing/Undressing Upper body dressing        Upper body assist      Lower Body Dressing/Undressing Lower body dressing            Lower body assist       Toileting Toileting    Toileting assist       Transfers Chair/bed transfer  Transfers assist           Locomotion Ambulation   Ambulation assist              Walk 10 feet activity   Assist           Walk 50 feet activity   Assist           Walk 150 feet activity   Assist           Walk 10 feet on uneven surface  activity   Assist           Wheelchair     Assist  Wheelchair 50 feet with 2 turns activity    Assist            Wheelchair 150 feet activity     Assist            Medical Problem List and Plan: 1.  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  Begin CIR 2.  DVT Prophylaxis/Anticoagulation: Pharmaceutical: Lovenox 3. Chronic pain/Pain Management: On MS contin 60 mg bid with oxycodone prn.   4. Mood: LCSW to follow for evaluation and support  See #12 5. Neuropsych: This patient is capable of making decisions on his own behalf. 6. Skin/Wound Care: Continue compressive dressing with wound VAC 1-2 weeks per surgeon.  7. Fluids/Electrolytes/Nutrition: Monitor I/O.  8. HTN: Monitor BP bid--continue hydralazine tid. OFF Norvasc, Lasix and Imdur at this time.   Monitor with increased mobility 9. H/o CAD:  Stable on Metoprolol, Plavix and ASA. Resume Lopid.   10. T2DM poorly controlled: Was on Victoza 1.8, Tresiba 154 Units  and novolog 60 units?  Changed back to insulin glargine 72 units bid and resume Victoza. Continue Novolog 20 units tid ac meal coverage.   Monitor BS ac/hs and use SSI for tighter control.   Monitor with increased mobility 11. Insensate diabetic neuropathy: Continue gabapentin 300 mg tid.  12. H/o Panic attacks: Klonopin changed to BID as needed on 1/28 13. Constipation: Increased Miralax to bid.    Consider further medication adjustments tomorrow if patient still has not had a bowel movement 14. Morbid obesity: Bariatric bed. Educate patient on appropriate diet and weight loss to help promote health and mobility.  15. ABLA:   Hemoglobin 10.6 on 1/28  Continue to monitor 16. Hyponatremia:   Sodium 136 on 1/28  Continue to monitor 17. Low calorie malnutrition: Added prostat for healing.  18.  Leukocytosis  WBCs 10.8 on 1/28  Continue to monitor  LOS: 1 days A FACE TO FACE EVALUATION WAS PERFORMED  Lorann Tani Lorie Phenix 09/18/2018, 8:34 AM
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
## 2018-09-18 NOTE — Progress Notes (Signed)
Patient information reviewed and entered into eRehab System by Becky Kemonte Ullman, PPS coordinator. Information including medical coding, function ability, and quality indicators will be reviewed and updated through discharge.   

## 2018-09-18 NOTE — Care Management Note (Signed)
Inpatient Rehabilitation Center Individual Statement of Services  Patient Name:  Kristopher Simon  Date:  09/18/2018  Welcome to the [facility].  Our goal is to provide you with an individualized program based on your diagnosis and situation, designed to meet your specific needs.  With this comprehensive rehabilitation program, you will be expected to participate in at least 3 hours of rehabilitation therapies Monday-Friday, with modified therapy programming on the weekends.  Your rehabilitation program will include the following services:  Physical Therapy (PT), Occupational Therapy (OT), 24 hour per day rehabilitation nursing, Therapeutic Recreaction (TR), Neuropsychology, Case Management (Social Worker), Rehabilitation Medicine, Nutrition Services and Pharmacy Services  Weekly team conferences will be held on Wednesday to discuss your progress.  Your Social Worker will talk with you frequently to get your input and to update you on team discussions.  Team conferences with you and your family in attendance may also be held.  Expected length of stay: 7-10 days  Overall anticipated outcome: supervision level with cues  Depending on your progress and recovery, your program may change. Your Social Worker will coordinate services and will keep you informed of any changes. Your Social Worker's name and contact numbers are listed  below.  The following services may also be recommended but are not provided by the [facility] will be made to provide these services after discharge if needed.  Arrangements include referral to agencies that provide these services.  Your insurance has been verified to be:  medicaid Your primary doctor is:  Judson Roch [place]  Pertinent information will be shared with your doctor and your insurance company.  Social Worker:   Ovidio Kin, [place] or ([phone]  Information discussed with and copy given to patient by: Elease Hashimoto, 09/18/2018, 9:25 AM

## 2018-09-19 ENCOUNTER — Inpatient Hospital Stay (HOSPITAL_COMMUNITY): Payer: Medicaid Other

## 2018-09-19 ENCOUNTER — Encounter (HOSPITAL_COMMUNITY): Payer: Medicaid Other | Admitting: Psychology

## 2018-09-19 ENCOUNTER — Inpatient Hospital Stay (HOSPITAL_COMMUNITY): Payer: Medicaid Other | Admitting: Occupational Therapy

## 2018-09-19 DIAGNOSIS — F411 Generalized anxiety disorder: Secondary | ICD-10-CM

## 2018-09-19 DIAGNOSIS — F4001 Agoraphobia with panic disorder: Secondary | ICD-10-CM

## 2018-09-19 DIAGNOSIS — E119 Type 2 diabetes mellitus without complications: Secondary | ICD-10-CM

## 2018-09-19 DIAGNOSIS — M14671 Charcot's joint, right ankle and foot: Secondary | ICD-10-CM

## 2018-09-19 DIAGNOSIS — I2510 Atherosclerotic heart disease of native coronary artery without angina pectoris: Secondary | ICD-10-CM

## 2018-09-19 LAB — GLUCOSE, CAPILLARY
GLUCOSE-CAPILLARY: 240 mg/dL — AB (ref 70–99)
GLUCOSE-CAPILLARY: 251 mg/dL — AB (ref 70–99)
Glucose-Capillary: 77 mg/dL (ref 70–99)
Glucose-Capillary: 94 mg/dL (ref 70–99)
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
## 2018-09-19 NOTE — Progress Notes (Signed)
Physical Therapy Session Note  Patient Details  Name: Kristopher Simon MRN: [id number] Date of Birth: 1965-10-09  Today's Date: 09/19/2018 PT Individual Time: 1400-1505 PT Individual Time Calculation (min): 65 min   Short Term Goals: Week 1:  PT Short Term Goal 1 (Week 1): STG=LTG due to ELOS  Skilled Therapeutic Interventions/Progress Updates:   Pt sitting up in w/c.  W/c propulsion using bil UEs on level tile with supervision, with cues for efficiency and turns.  . Pt removed and placed bil leg rests on w/c throughout session, with mod multimodal cues and supervision.  Cues for brakes.  R armrest pad was broken; PT exchanged it.    Simulated car transfer iwht RW to 28" high seat, stand pivot with min guard assist.  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  Gait training with RW on level tile x 35' with CGA.  Cues for pt to avoid hopping, and elevate trunk by bil shoulder depression.   Seated strengthening exs: 10 x 1 L quad sets, self stretching R heel cord and hamstrings, from w/c with RLE up on mat table, x 30 seconds x 3.  Educated pt on need to remain flexible in bil LEs for optimal function and later fit of a prosthesis.   Pt left resting in w/c with needs at hand and sister in room.     Therapy Documentation Precautions:  Precautions Precautions: Fall Restrictions Weight Bearing Restrictions: Yes LLE Weight Bearing: Non weight bearing   Pain: " the medicine is helping; I feel pretty good".     Therapy/Group: Individual Therapy  Raniah Karan 09/19/2018, 4:40 PM

## 2018-09-19 NOTE — Progress Notes (Signed)
Social Work Patient ID: Kristopher Simon, male   DOB: November 11, 1965, 53 y.o.   MRN: [id number] Met with pt to discuss team conference goals supervision-mod/i and target discharge date 2/5. He feels this may be too short and he may need to stay her longer. Discussed will continue to re-evaluate and discuss him reaching the set goals with therapy team. He feels the neuro-psych was helpful and glad he could see him. Pt still working on his constipation issue. Continue to work on discharge needs.

## 2018-09-19 NOTE — Progress Notes (Signed)
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  Objective: Vital Signs: Blood pressure 136/74, pulse 82, temperature 97.7 F (36.5 C), temperature source Oral, resp. rate 18, height 6\' 5"  (1.956 m), SpO2 93 %. No results found. Recent Labs    09/18/18 0530  WBC 10.8*  HGB 10.6*  HCT 34.9*  PLT 447*   Recent Labs    09/17/18 0403 09/18/18 0530  NA 133* 136  K 3.9 4.4  CL 95* 94*  CO2 27 31  GLUCOSE 276* 227*  BUN 9 14  CREATININE 1.19 1.12  CALCIUM 9.0 9.2    Physical Exam: BP 136/74 (BP Location: Left Arm)   Pulse 82   Temp 97.7 F (36.5 C) (Oral)   Resp 18   Ht 6\' 5"  (1.956 m)   SpO2 93%   BMI 46.64 kg/m  Constitutional: No distress . Vital signs reviewed. HENT: Normocephalic.  Atraumatic. Eyes: EOMI. No discharge. Cardiovascular: RRR. No JVD. Respiratory: CTA Bilaterally. Normal effort. GI: BS +. Non-distended. Musc: No edema or tenderness in extremities. Musculoskeletal: L-BKA with compressive coban dressing and wound VAC in place. Neurological: He is alert and oriented to person, place, and time.  Motor: B/l UE: 5/5 proximal to to distal RLE: 4+/5 (contralateral pain inhibition), stable LLE: HF: 4+/5 (pain inhibition), stable Skin:  RLE multiple scabs on tibia and dry flaky skin with callused area lateral foot  R- foot with dry flaky skin.   Callused ulcer right lateral foot.   Psychiatric: He has a normal mood and affect. His behavior is normal.   Assessment/Plan: 1. Functional deficits secondary to left BKA which require 3+ hours per day of interdisciplinary therapy in a comprehensive inpatient rehab setting.  Physiatrist is providing close team supervision and 24 hour management of active medical problems listed below.  Physiatrist and rehab team continue to assess barriers to  discharge/monitor patient progress toward functional and medical goals  Care Tool:  Bathing    Body parts bathed by patient: Right arm, Face, Left arm, Chest, Abdomen, Front perineal area, Right upper leg, Left upper leg   Body parts bathed by helper: Buttocks, Right lower leg Body parts n/a: Left lower leg(L BKA)   Bathing assist Assist Level: Minimal Assistance - Patient > 75%     Upper Body Dressing/Undressing Upper body dressing   What is the patient wearing?: Pull over shirt    Upper body assist Assist Level: Set up assist    Lower Body Dressing/Undressing Lower body dressing            Lower body assist       Toileting Toileting    Toileting assist Assist for toileting: Minimal Assistance - Patient > 75%     Transfers Chair/bed transfer  Transfers assist     Chair/bed transfer assist level: Minimal Assistance - Patient > 75%     Locomotion Ambulation   Ambulation assist      Assist level: Minimal Assistance - Patient > 75% Assistive device: Walker-rolling Max distance: 35'   Walk 10 feet activity   Assist     Assist level: Minimal Assistance - Patient > 75% Assistive device: Walker-rolling   Walk 50 feet activity   Assist Walk 50 feet with 2 turns activity did not occur: Safety/medical concerns         Walk 150 feet activity   Assist  Walk 150 feet activity did not occur: Safety/medical concerns         Walk 10 feet on uneven surface  activity   Assist Walk 10 feet on uneven surfaces activity did not occur: Safety/medical concerns         Wheelchair     Assist Will patient use wheelchair at discharge?: Yes Type of Wheelchair: Manual    Wheelchair assist level: Supervision/Verbal cueing Max wheelchair distance: 150'    Wheelchair 50 feet with 2 turns activity    Assist        Assist Level: Supervision/Verbal cueing   Wheelchair 150 feet activity     Assist     Assist Level:  Supervision/Verbal cueing      Medical Problem List and Plan: 1.  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  Team conf today 2.  DVT Prophylaxis/Anticoagulation: Pharmaceutical: Lovenox 3. Chronic pain/Pain Management: On MS contin 60 mg bid with oxycodone prn.   4. Mood: LCSW to follow for evaluation and support  See #12 5. Neuropsych: This patient is capable of making decisions on his own behalf. 6. Skin/Wound Care: Continue compressive dressing with wound VAC 1-2 weeks per surgeon.  7. Fluids/Electrolytes/Nutrition: Monitor I/O.  8. HTN: Monitor BP bid--continue hydralazine tid. OFF Norvasc, Lasix and Imdur at this time.    Vitals:   09/18/18 2030 09/19/18 0505  BP: (!) 163/56 136/74  Pulse: 82 82  Resp: 19 18  Temp: 98.5 F (36.9 C) 97.7 F (36.5 C)  SpO2: 100% 93%  controlled 9. H/o CAD:  Stable on Metoprolol, Plavix and ASA. Resume Lopid.   10. T2DM poorly controlled: Was on Victoza 1.8, Tresiba 154 Units  and novolog 60 units? Changed back to insulin glargine 72 units bid and resume Victoza. Continue Novolog 20 units tid ac meal coverage.   Monitor BS ac/hs and use SSI for tighter control.   Monitor with increased mobility 11. Insensate diabetic neuropathy: Continue gabapentin 300 mg tid.  12. H/o Panic attacks: Klonopin changed to BID as needed on 1/28 13. Constipation: Increased Miralax to bid.    Consider further medication adjustments tomorrow if patient still has not had a bowel movement, none recorded on 1/28 14. Morbid obesity: Bariatric bed. Educate patient on appropriate diet and weight loss to help promote health and mobility.  15. ABLA:   Hemoglobin 10.6 on 1/28  Continue to monitor 16. Hyponatremia:   Sodium 136 on 1/28  Continue to monitor 17. Low calorie malnutrition: Added prostat for healing.  18.  Leukocytosis  WBCs 10.8 on 1/28  Continue to monitor  LOS: 2 days A FACE TO FACE EVALUATION WAS  PERFORMED  Charlett Blake 09/19/2018, 7:53 AM

## 2018-09-19 NOTE — Progress Notes (Signed)
Occupational Therapy Session Note  Patient Details  Name: Kristopher Simon MRN: [id number] Date of Birth: 07/16/1966  Today's Date: 09/19/2018 OT Individual Time: 0730-0845 OT Individual Time Calculation (min): 75 min    Short Term Goals: Week 1:  OT Short Term Goal 1 (Week 1): STG=LTG due to LOS  Skilled Therapeutic Interventions/Progress Updates:    Session focused on bathing and dressing at sink level. Pt received supine in bed with no c/o pain. Pt completed bed mobility with slight impulsivity and no physical assist, heavy use of bed features. Cueing for UE placement during SPT and sequencing. Cueing required for NWB LLE, with pt attempting to put knee on chair when turning.  Min A overall SPT. Pt sat in w/c at sink and completed UB bathing/dressing with set up. Min A to stand and to complete peri hygiene. Cueing for UE placement and for LLE positioning in standing to promote maximal extension (pt already favoring knee and hip flexion). Pt sat at sink and completed shaving task with set up. Pt propelled w/c down to therapy gym and completed 3 ft of functional mobility (hopping on R LE) with RW, min A. Pt completed sit <> stand transfers with a  Focus on positioning of LLE, with use of a mirror for visual cues. Pt returned to room and left sitting up with all needs met.   Therapy Documentation Precautions:  Precautions Precautions: Fall Restrictions Weight Bearing Restrictions: Yes LLE Weight Bearing: Non weight bearing  Pain:  No c/o pain during session  Therapy/Group: Individual Therapy  Sandra H Davis 09/19/2018, 7:15 AM  

## 2018-09-19 NOTE — Progress Notes (Signed)
Occupational Therapy Session Note  Patient Details  Name: Kristopher Simon MRN: [id number] Date of Birth: 1966-03-19  Today's Date: 09/19/2018 OT Individual Time: 1300-1355 OT Individual Time Calculation (min): 55 min    Short Term Goals: Week 1:  OT Short Term Goal 1 (Week 1): STG=LTG due to LOS  Skilled Therapeutic Interventions/Progress Updates:    Pt seen for OT session focusing on functional standing balacne/endurance. Pt sitting up in wc upon arrival with CSW present discussing d/c planning. Pt alittle hesitant with 7 day LOS, but motivated to cont with therapy and gain independence.  Pt self- propelled w/c throughout unit with supervision for UE strengthening/endurance. Completed stand pivot transfer w/c>EOM with CGA using RW.  Completed dynamic standing task standing from EOM with CGA using RW. Stood to remove clothes pins placed around waist band in simulation of LB clothing management. Completed x4 trials, tolerating ~1 minute in standing before taking seated rest break.  Then completed dynamic reaching task in standing, reaching to place velcro playing cards on matching board. Pt with one LOB episode, attempting to hold onto rolling board to catch balance. Mod A overall to recover.  Pt returned to room at end of session, extensive education, discussion and demonstration provided regarding proper positioning of residual limb and positioning in prep for prosthetic training.  Education provided throughout session regarding continuum of care, w/c parts management, safety awareness, and d/c planning.  Pt left seated in w/c at end of session awaiting hand off to PT.   Therapy Documentation Precautions:  Precautions Precautions: Fall Restrictions Weight Bearing Restrictions: Yes LLE Weight Bearing: Non weight bearing Pain:   No/denies pain   Therapy/Group: Individual Therapy  Trynity Skousen L 09/19/2018, 7:18 AM

## 2018-09-20 ENCOUNTER — Inpatient Hospital Stay (HOSPITAL_COMMUNITY): Payer: Medicaid Other | Admitting: Physical Therapy

## 2018-09-20 ENCOUNTER — Inpatient Hospital Stay (HOSPITAL_COMMUNITY): Payer: Medicaid Other

## 2018-09-20 LAB — GLUCOSE, CAPILLARY
GLUCOSE-CAPILLARY: 130 mg/dL — AB (ref 70–99)
Glucose-Capillary: 180 mg/dL — ABNORMAL HIGH (ref 70–99)
Glucose-Capillary: 74 mg/dL (ref 70–99)
Glucose-Capillary: 102 mg/dL — ABNORMAL HIGH (ref 70–99)

## 2018-09-20 MED ORDER — INSULIN GLARGINE 100 UNIT/ML ~~LOC~~ SOLN
70.0000 [IU] | Freq: Two times a day (BID) | SUBCUTANEOUS | Status: DC
  Administered 2018-09-20 – 2018-09-25 (×9): 70 [IU] via SUBCUTANEOUS
  Filled 2018-09-20 (×14): qty 0.7

## 2018-09-20 MED ORDER — DM-GUAIFENESIN ER 30-600 MG PO TB12
1.0000 | ORAL_TABLET | Freq: Two times a day (BID) | ORAL | Status: DC
  Administered 2018-09-20 – 2018-09-26 (×13): 1 via ORAL
  Filled 2018-09-20 (×14): qty 1

## 2018-09-20 MED ORDER — CITALOPRAM HYDROBROMIDE 10 MG PO TABS
30.0000 mg | ORAL_TABLET | Freq: Every day | ORAL | Status: DC
  Administered 2018-09-20 – 2018-09-26 (×7): 30 mg via ORAL
  Filled 2018-09-20 (×7): qty 3

## 2018-09-20 NOTE — Progress Notes (Signed)
Physical Therapy Session Note  Patient Details  Name: Kristopher Simon MRN: [id number] Date of Birth: 09-26-1965  Today's Date: 09/20/2018 PT Individual Time: 0800-0920 PT Individual Time Calculation (min): 80 min   Short Term Goals: Week 1:  PT Short Term Goal 1 (Week 1): STG=LTG due to ELOS  Skilled Therapeutic Interventions/Progress Updates:  Pt sitting on recliner.  He stated that he felt panicky and needed his meds.  PT urged him to call with call bell and request it.  Lonn Georgia, RN dispensed meds during session.  Stand pivot recliner > w/c with RW, close supervision.   Community mobility in w/c, including backing into elevator, role-playing for asking someone to hold elevator doors for him, pushing elevator buttons, etc.  In lobby on 1st floor, pt propelled through obstacle course of armchairs (with PT sitting in some) and banquettes, encountering 2/10 obstacles.  Seated strengthening: 10 x 1 each: L quad sets, L short arc quad knee extensions, R long arc quad knee extensions with ankle pumps at end range.   Gait training iwht RW on level tile with mulitple turns, x 37' with close supervision.  Pt stated that his R knee did not "feel right".  PT encouraged pt to sit at that time.  Pt stated that his BR doorway was narrow, but has not been measure yet.  Judie Petit RW is 21" deep, for use sideways in/out of doorways.  Sit> stand and pivot with RW w/c> recliner.  bil LEs elevated with pad, and needs left in place.      Therapy Documentation Precautions:  Precautions Precautions: Fall Restrictions Weight Bearing Restrictions: Yes LLE Weight Bearing: Non weight bearing(s/p  Lf. BKA)  Pain: Pain Assessment Pain Scale: 0-10 Pain Score: 3  Pain Type: Acute pain;Surgical pain Pain Location: Leg Pain Orientation: Left Pain Descriptors / Indicators: Aching Pain Frequency: Intermittent Pain Onset: With Activity Patients Stated Pain Goal: 1 Pain Intervention(s): Medication (See eMAR)(MS Contin  given prior)     Therapy/Group: Individual Therapy  Josephene Marrone 09/20/2018, 10:41 AM

## 2018-09-20 NOTE — Progress Notes (Signed)
Patient information reviewed and entered into [facility] by Becky Barak Bialecki, PPS coordinator. Information including medical coding, function ability, and quality indicators will be reviewed and updated through discharge.   

## 2018-09-20 NOTE — Progress Notes (Signed)
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  Objective: Vital Signs: Blood pressure (!) 157/77, pulse 89, temperature 97.7 F (36.5 C), temperature source Oral, resp. rate 18, height 6\' 5"  (1.956 m), SpO2 100 %. No results found. Recent Labs    09/18/18 0530  WBC 10.8*  HGB 10.6*  HCT 34.9*  PLT 447*   Recent Labs    09/18/18 0530  NA 136  K 4.4  CL 94*  CO2 31  GLUCOSE 227*  BUN 14  CREATININE 1.12  CALCIUM 9.2    Physical Exam: BP (!) 157/77 (BP Location: Left Arm)   Pulse 89   Temp 97.7 F (36.5 C) (Oral)   Resp 18   Ht 6\' 5"  (1.956 m)   SpO2 100%   BMI 46.64 kg/m  Constitutional: No distress . Vital signs reviewed. HENT: Normocephalic.  Atraumatic. Eyes: EOMI. No discharge. Cardiovascular: RRR. No JVD. Respiratory: CTA Bilaterally. Normal effort. GI: BS +. Non-distended. Musc: No edema or tenderness in extremities. Musculoskeletal: L-BKA with compressive coban dressing and wound VAC in place. Neurological: He is alert and oriented to person, place, and time.  Motor: B/l UE: 5/5 proximal to to distal RLE: 4+/5 (contralateral pain inhibition), stable LLE: HF: 4+/5 (pain inhibition), stable Skin:  RLE multiple scabs on tibia and dry flaky skin with callused area lateral foot  R- foot with dry flaky skin.   Callused ulcer right lateral foot.   Psychiatric: He has a normal mood and affect. His behavior is normal.   Assessment/Plan: 1. Functional deficits secondary to left BKA which require 3+ hours per day of interdisciplinary therapy in a comprehensive inpatient rehab setting.  Physiatrist is providing close team supervision and 24 hour management of active medical problems listed below.  Physiatrist and rehab team continue to assess barriers to discharge/monitor patient progress  toward functional and medical goals  Care Tool:  Bathing    Body parts bathed by patient: Right arm, Face, Left arm, Chest, Abdomen, Front perineal area, Right upper leg, Left upper leg, Buttocks   Body parts bathed by helper: Buttocks, Right lower leg Body parts n/a: Left lower leg   Bathing assist Assist Level: Minimal Assistance - Patient > 75%     Upper Body Dressing/Undressing Upper body dressing   What is the patient wearing?: Pull over shirt    Upper body assist Assist Level: Set up assist    Lower Body Dressing/Undressing Lower body dressing      What is the patient wearing?: Pants     Lower body assist Assist for lower body dressing: Minimal Assistance - Patient > 75%     Toileting Toileting    Toileting assist Assist for toileting: Minimal Assistance - Patient > 75%     Transfers Chair/bed transfer  Transfers assist     Chair/bed transfer assist level: Contact Guard/Touching assist     Locomotion Ambulation   Ambulation assist      Assist level: Contact Guard/Touching assist Assistive device: Walker-rolling Max distance: 35'   Walk 10 feet activity   Assist     Assist level: Contact Guard/Touching assist Assistive device: Walker-rolling   Walk 50 feet activity   Assist Walk 50 feet with 2 turns activity did not occur: Safety/medical concerns         Walk 150 feet activity   Assist Walk 150 feet activity  did not occur: Safety/medical concerns         Walk 10 feet on uneven surface  activity   Assist Walk 10 feet on uneven surfaces activity did not occur: Safety/medical concerns         Wheelchair     Assist Will patient use wheelchair at discharge?: Yes Type of Wheelchair: Manual    Wheelchair assist level: Supervision/Verbal cueing Max wheelchair distance: 150'    Wheelchair 50 feet with 2 turns activity    Assist        Assist Level: Supervision/Verbal cueing   Wheelchair 150 feet  activity     Assist     Assist Level: Supervision/Verbal cueing      Medical Problem List and Plan: 1.  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  CIR PT, OT 2.  DVT Prophylaxis/Anticoagulation: Pharmaceutical: Lovenox 3. Chronic pain/Pain Management: On MS contin 60 mg bid with oxycodone prn.   4. Mood: LCSW to follow for evaluation and support  See #12, Neuropsych to see 5. Neuropsych: This patient is capable of making decisions on his own behalf. 6. Skin/Wound Care: Continue compressive dressing with wound VAC 1-2 weeks per surgeon.  7. Fluids/Electrolytes/Nutrition: Monitor I/O.  8. HTN: Monitor BP bid--continue hydralazine tid. OFF Norvasc, Lasix and Imdur at this time.    Vitals:   09/19/18 1925 09/20/18 0501  BP: 124/65 (!) 157/77  Pulse: 90 89  Resp: 18 18  Temp: 98.1 F (36.7 C) 97.7 F (36.5 C)  SpO2: 97% 100%  controlled 1/30 9. H/o CAD:  Stable on Metoprolol, Plavix and ASA. Resume Lopid.   10. T2DM poorly controlled: Was on Victoza 1.8, Tresiba 154 Units  and novolog 60 units? Changed back to insulin glargine 72 units bid and resume Victoza. Continue Novolog 20 units tid ac meal coverage.   Monitor BS ac/hs and use SSI for tighter control.   Monitor with increased mobility 11. Insensate diabetic neuropathy: Continue gabapentin 300 mg tid.  12. H/o Panic attacks: Klonopin changed to BID as needed on 1/28, neuropsych, pt took Celexa at home, pt is on 20mg  per day may increase dose to 30mg  13. Constipation: Increased Miralax to bid.    Consider further medication adjustments tomorrow if patient still has not had a bowel movement, none recorded on 1/28, several BM 1/29 14. Morbid obesity: Bariatric bed. Educate patient on appropriate diet and weight loss to help promote health and mobility.  15. ABLA:   Hemoglobin 10.6 on 1/28  Continue to monitor 16. Hyponatremia:   Sodium 136 on 1/28  Continue to monitor 17. Low  calorie malnutrition: Added prostat for healing.  18.  Leukocytosis  WBCs 10.8 on 1/28  Continue to monitor  LOS: 3 days A FACE TO [place] 09/20/2018, 7:45 AM

## 2018-09-20 NOTE — Progress Notes (Signed)
Pt CBG @ 1640 was 74. Nurse held Novolog 20units scheduled. Pt refused supper at that time. Asymptomatic. Will cont to monitor.   Erie Noe, LPN

## 2018-09-20 NOTE — Progress Notes (Signed)
Occupational Therapy Session Note  Patient Details  Name: Kristopher Simon MRN: [id number] Date of Birth: [birth year]  Today's Date: 09/20/2018 OT Individual Time: 1000-1100 OT Individual Time Calculation (min): 60 min    Short Term Goals: Week 1:  OT Short Term Goal 1 (Week 1): STG=LTG due to LOS  Skilled Therapeutic Interventions/Progress Updates:    1:1. Pt very verbose at beginning of session reporting anxiety and restlessness this morning. Pt discussed alternative calming strategies, deep breathing as well as using music to relax. OT confirms with social work neuropsych visit next week and follow up after d/c. Pt happy with this confirmation. Pt completes stand pivot transfer with RW to w/c and min A and stands at sink while using electric razor and 1UE support on sink with CGA. Pt requires cues for terminal hip extension. Pt sits at sink to "detail" with regular razor. Exited session with direct handoff to next therapist.  Therapy Documentation Precautions:  Precautions Precautions: Fall Restrictions Weight Bearing Restrictions: Yes LLE Weight Bearing: Non weight bearing(s/p  Lf. BKA) General:   Vital Signs:  Pain: Pain Assessment Pain Scale: 0-10 Pain Score: 3  Pain Type: Acute pain;Surgical pain Pain Location: Leg Pain Orientation: Left Pain Descriptors / Indicators: Aching Pain Frequency: Intermittent Pain Onset: With Activity Patients Stated Pain Goal: 1 Pain Intervention(s): Medication (See eMAR)(MS Contin given prior) ADL:   Vision   Perception    Praxis   Exercises:   Other Treatments:     Therapy/Group: Individual Therapy  Tonny Branch 09/20/2018, 10:46 AM

## 2018-09-20 NOTE — Progress Notes (Signed)
Physical Therapy Session Note  Patient Details  Name: Kristopher Simon MRN: [id number] Date of Birth: 12-17-1965  Today's Date: 09/20/2018 PT Individual Time: -    Short Term Goals: Week 1:  PT Short Term Goal 1 (Week 1): STG=LTG due to ELOS  Skilled Therapeutic Interventions/Progress Updates:    Pt received entering room in w/c with OT, agreeable to work with therapy. Pt reports no pain at the moment, but that he is having a bad day with anxiety and is worried about having another panic attack. Because of this, pt informed PT when he was feeling overwhelmed or uncomfortable throughout session. Pt able to set up w/c for sit to stand transfer with v/c for management of residual limb leg rest. Sit to stand transfers with CGA throughout session, v/c for hand placement. Ambulated 50 ft in hall with CGA for safety due to longer distance, v/c for coordination of UE push and LE hop. Pt SOB after ambulation. Pt able to propel w/c 25 ft into gym with S. Stand pivot transfer from w/c to mat table, CGA and v/c to back up to table before sitting down for safety. Attempted exercises in supine on wedge, able to complete 10 glute sets with 5 sec hold before pt became uncomfortable in supine. Completed seated exercises: 2 x 15 knee extensions, 2 x 15 marches. Pt propelled w/c back to room 75 ft with S. Education provided about positioning of knee to prevent knee flexion and hip external rotation contractures and importance of knee extension throughout day. Pt left in recliner with needs met, call bell in reach.  Therapy Documentation Precautions:  Precautions Precautions: Fall Restrictions Weight Bearing Restrictions: Yes LLE Weight Bearing: Non weight bearing  Pain: Pain Assessment Pain Scale: 0-10 Pain Score: 0-No pain   Therapy/Group: Individual Therapy   Ronnell Gu[place]aj[place], SPT   09/20/2018, 12:16 PM

## 2018-09-21 ENCOUNTER — Inpatient Hospital Stay (HOSPITAL_COMMUNITY): Payer: Medicaid Other | Admitting: Physical Therapy

## 2018-09-21 ENCOUNTER — Inpatient Hospital Stay (HOSPITAL_COMMUNITY): Payer: Medicaid Other | Admitting: Occupational Therapy

## 2018-09-21 ENCOUNTER — Inpatient Hospital Stay (HOSPITAL_COMMUNITY): Payer: Medicaid Other

## 2018-09-21 LAB — GLUCOSE, CAPILLARY
GLUCOSE-CAPILLARY: 198 mg/dL — AB (ref 70–99)
Glucose-Capillary: 144 mg/dL — ABNORMAL HIGH (ref 70–99)
Glucose-Capillary: 154 mg/dL — ABNORMAL HIGH (ref 70–99)
Glucose-Capillary: 174 mg/dL — ABNORMAL HIGH (ref 70–99)

## 2018-09-21 MED ORDER — HYDROXYZINE HCL 10 MG PO TABS
10.0000 mg | ORAL_TABLET | Freq: Three times a day (TID) | ORAL | Status: DC
  Administered 2018-09-21 – 2018-09-26 (×15): 10 mg via ORAL
  Filled 2018-09-21 (×18): qty 1

## 2018-09-21 MED ORDER — CLONAZEPAM 0.5 MG PO TABS: 0.5000 mg | ORAL_TABLET | Freq: Two times a day (BID) | ORAL | Status: DC | PRN

## 2018-09-21 MED ORDER — CLONAZEPAM 0.5 MG PO TABS
0.2500 mg | ORAL_TABLET | Freq: Two times a day (BID) | ORAL | Status: DC | PRN
  Administered 2018-09-21 – 2018-09-26 (×4): 0.25 mg via ORAL
  Filled 2018-09-21 (×8): qty 1

## 2018-09-21 NOTE — Progress Notes (Signed)
Occupational Therapy Session Note  Patient Details  Name: Kristopher Simon MRN: [id number] Date of Birth: 06-23-1966  Today's Date: 09/21/2018 OT Individual Time: 7622-6333 OT Individual Time Calculation (min): 15 min    Short Term Goals: Week 1:  OT Short Term Goal 1 (Week 1): STG=LTG due to LOS  Skilled Therapeutic Interventions/Progress Updates:    1:1. Pt received in bed with very low mood stating, "I dont feel well" and "I dont want to do anything." pt agreeable to OT coming back in 20 min after education on participation in tx to improve skills and independence with BADLs. Upon returning 20 min later, pt continues to refuse tx. OT noticed pt with residual limb bent with pillow under knee. Educated on importance of knee extension as well as positioning in bed. Pt grateful and repositioned with A from OT. Exited session with pt seated in bed, call light in reach and all needs met  Therapy Documentation Precautions:  Precautions Precautions: Fall Restrictions Weight Bearing Restrictions: Yes LLE Weight Bearing: Non weight bearing General:   Vital Signs: Therapy Vitals Temp: 97.8 F (36.6 C) Temp Source: Oral Pulse Rate: (!) 101 Resp: (!) 9(rn notified ) BP: (!) 170/87(rn notified ) Patient Position (if appropriate): Lying Oxygen Therapy SpO2: 99 % Pain: Pain Assessment Pain Scale: 0-10 Pain Score: 2  Pain Type: Acute pain Pain Location: Leg Pain Orientation: Left Pain Descriptors / Indicators: Cramping Pain Frequency: Intermittent Pain Onset: On-going Pain Intervention(s): Refused ADL:   Vision   Perception    Praxis   Exercises:   Other Treatments:     Therapy/Group: Individual Therapy  Tonny Branch 09/21/2018, 8:22 AM

## 2018-09-21 NOTE — Progress Notes (Signed)
Social Work Patient ID: Kristopher Simon, male   DOB: 06/11/1966, 54 y.o.   MRN: [id number] Met with pt to discuss his day he is not feeling well and feels like the ump is out of him. He has refused therapy in am and is debating going to this afternoon session. Encouraged him to go and then rest since the weekends he can rest more and then start fresh Monday. He states: " It's like I don't have any energy." He did have a panic attack this am, he wants to see if anxiety meds could be scheduled will ask PA. Will schedule him to again see neuro-psych already scheduled to see peer support person. Work on discharge needs and provide support.
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
## 2018-09-21 NOTE — Progress Notes (Signed)
Physical Therapy Session Note  Patient Details  Name: Bhavesh Vazquez MRN: [id number] Date of Birth: 1966-02-04  Today's Date: 09/21/2018 PT Individual Time: 1000-1010 PT Individual Time Calculation (min): 10 min   Short Term Goals: Week 1:  PT Short Term Goal 1 (Week 1): STG=LTG due to ELOS  Skilled Therapeutic Interventions/Progress Updates:   Pt received supine in bed and agreeable to PT. Pt reported that he is unable to participate in therapy this AM due to extreme anxiety and fear of movement. Pt educated pt on resources availble in hospital to cope with anxiety and depression including neuropsychology and amputee support groups. PT assisted pt to repositioning in bed with min assist and min cues for proper use of BUE and RLE. Pt left in bed with call bell in reach and all needs met.       Therapy Documentation Precautions:  Precautions Precautions: Fall Restrictions Weight Bearing Restrictions: Yes LLE Weight Bearing: Non weight bearing General: PT Amount of Missed Time (min): 50 Minutes PT Missed Treatment Reason: Patient unwilling to participate Vital Signs: Therapy Vitals Temp: 97.6 F (36.4 C) Pulse Rate: 93 Resp: 18 BP: (!) 160/75 Patient Position (if appropriate): Sitting Oxygen Therapy SpO2: 99 % O2 Device: Room Air Pain: no evidence.   Therapy/Group: Individual Therapy  Lorie Phenix 09/21/2018, 3:45 PM

## 2018-09-21 NOTE — Progress Notes (Signed)
[facility] PHYSICAL MEDICINE & REHABILITATION PROGRESS NOTE  Subjective/Complaints:  Reviewed CBGs drops ~4pm to ~70 asymptomatic  ROS: Denies CP, shortness of breath, nausea, vomiting, diarrhea.  Objective: Vital Signs: Blood pressure (!) 170/87, pulse (!) 101, temperature 97.8 F (36.6 C), temperature source Oral, resp. rate (!) 9, height 6\' 5"  (1.956 m), SpO2 99 %. No results found. No results for input(s): WBC, HGB, HCT, PLT in the last 72 hours. No results for input(s): NA, K, CL, CO2, GLUCOSE, BUN, CREATININE, CALCIUM in the last 72 hours.  Physical Exam: BP (!) 170/87 (BP Location: Right Arm) Comment: rn notified   Pulse (!) 101   Temp 97.8 F (36.6 C) (Oral)   Resp (!) 9 Comment: rn notified   Ht 6\' 5"  (1.956 m)   SpO2 99%   BMI 46.64 kg/m  Constitutional: No distress . Vital signs reviewed. HENT: Normocephalic.  Atraumatic. Eyes: EOMI. No discharge. Cardiovascular: RRR. No JVD. Respiratory: CTA Bilaterally. Normal effort. GI: BS +. Non-distended. Musc: No edema or tenderness in extremities. Musculoskeletal: L-BKA with compressive coban dressing and wound VAC in place. Neurological: He is alert and oriented to person, place, and time.  Motor: B/l UE: 5/5 proximal to to distal RLE: 4+/5 (contralateral pain inhibition), stable LLE: HF: 4+/5 (pain inhibition), stable Skin:  RLE multiple scabs on tibia and dry flaky skin with callused area lateral foot  R- foot with dry flaky skin.   Callused ulcer right lateral foot.   Psychiatric: He has a normal mood and affect. His behavior is normal.   Assessment/Plan: 1. Functional deficits secondary to left BKA which require 3+ hours per day of interdisciplinary therapy in a comprehensive inpatient rehab setting.  Physiatrist is providing close team supervision and 24 hour management of active medical problems listed below.  Physiatrist and rehab team continue to assess barriers to discharge/monitor patient progress toward  functional and medical goals  Care Tool:  Bathing    Body parts bathed by patient: Right arm, Face, Left arm, Chest, Abdomen, Front perineal area, Right upper leg, Left upper leg, Buttocks   Body parts bathed by helper: Buttocks, Right lower leg Body parts n/a: Left lower leg   Bathing assist Assist Level: Minimal Assistance - Patient > 75%     Upper Body Dressing/Undressing Upper body dressing   What is the patient wearing?: Pull over shirt    Upper body assist Assist Level: Set up assist    Lower Body Dressing/Undressing Lower body dressing      What is the patient wearing?: Pants     Lower body assist Assist for lower body dressing: Minimal Assistance - Patient > 75%     Toileting Toileting    Toileting assist Assist for toileting: Contact Guard/Touching assist     Transfers Chair/bed transfer  Transfers assist     Chair/bed transfer assist level: Supervision/Verbal cueing     Locomotion Ambulation   Ambulation assist      Assist level: Contact Guard/Touching assist Assistive device: Walker-rolling Max distance: 50   Walk 10 feet activity   Assist     Assist level: Contact Guard/Touching assist Assistive device: Walker-rolling   Walk 50 feet activity   Assist Walk 50 feet with 2 turns activity did not occur: Safety/medical concerns  Assist level: Contact Guard/Touching assist Assistive device: Walker-rolling    Walk 150 feet activity   Assist Walk 150 feet activity did not occur: Safety/medical concerns         Walk 10 feet on uneven  surface  activity   Assist Walk 10 feet on uneven surfaces activity did not occur: Safety/medical concerns         Wheelchair     Assist Will patient use wheelchair at discharge?: Yes Type of Wheelchair: Manual    Wheelchair assist level: Supervision/Verbal cueing Max wheelchair distance: 75    Wheelchair 50 feet with 2 turns activity    Assist        Assist Level:  Supervision/Verbal cueing   Wheelchair 150 feet activity     Assist     Assist Level: Supervision/Verbal cueing      Medical Problem List and Plan: 1.  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  CIR PT, OT 2.  DVT Prophylaxis/Anticoagulation: Pharmaceutical: Lovenox 3. Chronic pain/Pain Management: On MS contin 60 mg bid with oxycodone prn.   4. Mood: LCSW to follow for evaluation and support  See #12, Neuropsych to see 5. Neuropsych: This patient is capable of making decisions on his own behalf. 6. Skin/Wound Care: Continue compressive dressing with wound VAC 1-2 weeks per surgeon. POD #7 will remove next week 7. Fluids/Electrolytes/Nutrition: Monitor I/O.  8. HTN: Monitor BP bid--continue hydralazine tid. OFF Norvasc, Lasix and Imdur at this time.    Vitals:   09/20/18 2053 09/21/18 0623  BP: 137/69 (!) 170/87  Pulse: 90 (!) 101  Resp: 12 (!) 9  Temp: 98.6 F (37 C) 97.8 F (36.6 C)  SpO2: 94% 99%  labile 1/31- monitor prior to med changes 9. H/o CAD:  Stable on Metoprolol, Plavix and ASA. Resume Lopid.   10. T2DM poorly controlled: Was on Victoza 1.8, Tresiba 154 Units  and novolog 60 units? Changed back to insulin glargine 72 units bid and resume Victoza. Continue Novolog 20 units tid ac meal coverage.   Monitor BS ac/hs and use SSI for tighter control.   Monitor with increased mobility 11. Insensate diabetic neuropathy: Continue gabapentin 300 mg tid.  12. H/o Panic attacks: Klonopin changed to BID as needed on 1/28, neuropsych, pt took Celexa at home, pt is on 20mg  per day may increase dose to 30mg  13. Constipation: Increased Miralax to bid.    Consider further medication adjustments tomorrow if patient still has not had a bowel movement, none recorded on 1/28, several BM 1/29 14. Morbid obesity: Bariatric bed. Educate patient on appropriate diet and weight loss to help promote health and mobility.  15. ABLA:    Hemoglobin 10.6 on 1/28  Continue to monitor 16. Hyponatremia:   Sodium 136 on 1/28  Continue to monitor 17. Low calorie malnutrition: Added prostat for healing.  18.  Leukocytosis  WBCs 10.8 on 1/28  Continue to monitor  LOS: 4 days A FACE TO [place] E Clance Baquero 09/21/2018, 8:35 AM

## 2018-09-22 ENCOUNTER — Inpatient Hospital Stay (HOSPITAL_COMMUNITY): Payer: Medicaid Other | Admitting: Occupational Therapy

## 2018-09-22 ENCOUNTER — Inpatient Hospital Stay (HOSPITAL_COMMUNITY): Payer: Medicaid Other | Admitting: Physical Therapy

## 2018-09-22 LAB — GLUCOSE, CAPILLARY
Glucose-Capillary: 177 mg/dL — ABNORMAL HIGH (ref 70–99)
Glucose-Capillary: 197 mg/dL — ABNORMAL HIGH (ref 70–99)
Glucose-Capillary: 83 mg/dL (ref 70–99)
Glucose-Capillary: 93 mg/dL (ref 70–99)
Glucose-Capillary: 105 mg/dL — ABNORMAL HIGH (ref 70–99)

## 2018-09-22 NOTE — Progress Notes (Signed)
Occupational Therapy Session Note  Patient Details  Name: Kristopher Simon MRN: [id number] Date of Birth: [birth year]  Today's Date: 09/22/2018 OT Individual Time: 2423-5361 OT Individual Time Calculation (min): 71 min   Short Term Goals: Week 1:  OT Short Term Goal 1 (Week 1): STG=LTG due to LOS  Skilled Therapeutic Interventions/Progress Updates:    Pt greeted in recliner and reported he hadn't eaten yet this morning, still awaiting breakfast. Very motivated to bowl before his meal arrived. Stand step transfer with RW completed with supervision and assist for wound vac mgt. He self propelled to dayroom to strengthen UB. While engaging in wii bowling, cued pt to activate core to work on core strengthening as well as activity tolerance. Pts affect visibly brightened while engaging in this activity. He reminisced about bowling frequently PTA with friends. Owned a $200 bowling ball. After two rounds, his breakfast arrived and he ate in the dayroom. We discussed leisure engagement in room to improve psychosocial wellbeing at CIR. He expressed interest in reading and doing word find puzzles. OT provided him with these materials (a puzzle and a book) for occupational enrichment outside of therapies. He then self propelled back to room and transferred back to recliner in manner as written above. Pt left with all needs within reach at end of tx.   Therapy Documentation Precautions:  Precautions Precautions: Fall Restrictions Weight Bearing Restrictions: Yes LLE Weight Bearing: Non weight bearing Pain: No s/s pain during session    ADL:       Therapy/Group: Individual Therapy  Kristopher Simon 09/22/2018, 12:23 PM

## 2018-09-22 NOTE — Progress Notes (Signed)
[facility] PHYSICAL MEDICINE & REHABILITATION PROGRESS NOTE  Subjective/Complaints:  Less anxious, now on scheduled atarax  ROS: Denies CP, shortness of breath, nausea, vomiting, diarrhea.  Objective: Vital Signs: Blood pressure 138/71, pulse 93, temperature 98.6 F (37 C), temperature source Oral, resp. rate 18, height 6\' 5"  (1.956 m), SpO2 98 %. No results found. No results for input(s): WBC, HGB, HCT, PLT in the last 72 hours. No results for input(s): NA, K, CL, CO2, GLUCOSE, BUN, CREATININE, CALCIUM in the last 72 hours.  Physical Exam: BP 138/71 (BP Location: Right Arm)   Pulse 93   Temp 98.6 F (37 C) (Oral)   Resp 18   Ht 6\' 5"  (1.956 m)   SpO2 98%   BMI 46.64 kg/m  Constitutional: No distress . Vital signs reviewed. HENT: Normocephalic.  Atraumatic. Eyes: EOMI. No discharge. Cardiovascular: RRR. No JVD. Respiratory: CTA Bilaterally. Normal effort. GI: BS +. Non-distended. Musc: No edema or tenderness in extremities. Musculoskeletal: L-BKA with compressive coban dressing and wound VAC in place. Neurological: He is alert and oriented to person, place, and time.  Motor: B/l UE: 5/5 proximal to to distal RLE: 5/5 , stable LLE: HF: 4+/5 (pain inhibition), stable Skin:  RLE multiple scabs on tibia and dry flaky skin with callused area lateral foot  R- foot with dry flaky skin.   Callused ulcer right lateral foot.   Psychiatric: He has a normal mood and affect. His behavior is normal.   Assessment/Plan: 1. Functional deficits secondary to left BKA which require 3+ hours per day of interdisciplinary therapy in a comprehensive inpatient rehab setting.  Physiatrist is providing close team supervision and 24 hour management of active medical problems listed below.  Physiatrist and rehab team continue to assess barriers to discharge/monitor patient progress toward functional and medical goals  Care Tool:  Bathing    Body parts bathed by patient: Right arm, Face,  Left arm, Chest, Abdomen, Front perineal area, Right upper leg, Left upper leg, Buttocks   Body parts bathed by helper: Buttocks, Right lower leg Body parts n/a: Left lower leg   Bathing assist Assist Level: Minimal Assistance - Patient > 75%     Upper Body Dressing/Undressing Upper body dressing   What is the patient wearing?: Pull over shirt    Upper body assist Assist Level: Set up assist    Lower Body Dressing/Undressing Lower body dressing      What is the patient wearing?: Pants     Lower body assist Assist for lower body dressing: Minimal Assistance - Patient > 75%     Toileting Toileting    Toileting assist Assist for toileting: Contact Guard/Touching assist     Transfers Chair/bed transfer  Transfers assist     Chair/bed transfer assist level: Supervision/Verbal cueing     Locomotion Ambulation   Ambulation assist      Assist level: Contact Guard/Touching assist Assistive device: Walker-rolling Max distance: 50   Walk 10 feet activity   Assist     Assist level: Contact Guard/Touching assist Assistive device: Walker-rolling   Walk 50 feet activity   Assist Walk 50 feet with 2 turns activity did not occur: Safety/medical concerns  Assist level: Contact Guard/Touching assist Assistive device: Walker-rolling    Walk 150 feet activity   Assist Walk 150 feet activity did not occur: Safety/medical concerns         Walk 10 feet on uneven surface  activity   Assist Walk 10 feet on uneven surfaces activity did not  occur: Safety/medical concerns         Wheelchair     Assist Will patient use wheelchair at discharge?: Yes Type of Wheelchair: Manual    Wheelchair assist level: Supervision/Verbal cueing Max wheelchair distance: 75    Wheelchair 50 feet with 2 turns activity    Assist        Assist Level: Supervision/Verbal cueing   Wheelchair 150 feet activity     Assist     Assist Level:  Supervision/Verbal cueing      Medical Problem List and Plan: 1.  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  CIR PT, OT 2.  DVT Prophylaxis/Anticoagulation: Pharmaceutical: Lovenox 3. Chronic pain/Pain Management: On MS contin 60 mg bid with oxycodone prn.   4. Mood: LCSW to follow for evaluation and support  See #12, Neuropsych to see 5. Neuropsych: This patient is capable of making decisions on his own behalf. 6. Skin/Wound Care: Continue compressive dressing with wound VAC 1-2 weeks per surgeon. POD #7 will remove next week 7. Fluids/Electrolytes/Nutrition: Monitor I/O.  8. HTN: Monitor BP bid--continue hydralazine tid. OFF Norvasc, Lasix and Imdur at this time.    Vitals:   09/21/18 2148 09/22/18 0554  BP: (!) 162/75 138/71  Pulse: 90 93  Resp: 12 18  Temp: 98.9 F (37.2 C) 98.6 F (37 C)  SpO2: 96% 98%  labile 1/31- monitor prior to med changes 9. H/o CAD:  Stable on Metoprolol, Plavix and ASA. Resume Lopid.   10. T2DM poorly controlled: Was on Victoza 1.8, Tresiba 154 Units  and novolog 60 units? Changed back to insulin glargine 72 units bid and resume Victoza. Continue Novolog 20 units tid ac meal coverage.   Monitor BS ac/hs and use SSI for tighter control.   Monitor with increased mobility 11. Insensate diabetic neuropathy: Continue gabapentin 300 mg tid.  12. H/o Panic attacks: Klonopin changed to BID as needed on 1/28, neuropsych, pt took Celexa at home, pt is on 20mg  per day may increase dose to 30mg  13. Constipation: Increased Miralax to bid.    Consider further medication adjustments tomorrow if patient still has not had a bowel movement, none recorded on 1/28, several BM 1/29 14. Morbid obesity: Bariatric bed. Educate patient on appropriate diet and weight loss to help promote health and mobility.  15. ABLA:   Hemoglobin 10.6 on 1/28  Continue to monitor 16. Hyponatremia:   Sodium 136 on 1/28  Continue to  monitor 17. Low calorie malnutrition: Added prostat for healing.  18.  Leukocytosis  WBCs 10.8 on 1/28  Continue to monitor 19.  Diabetes controlled but CBGs have been on low side CBG (last 3)  Recent Labs    09/21/18 1125 09/21/18 1626 09/21/18 2143  GLUCAP 154* 198* 174*  lantus  Reduced, monitor LOS: 5 days A FACE TO [place] 09/22/2018, 6:28 AM

## 2018-09-22 NOTE — Progress Notes (Signed)
Occupational Therapy Session Note  Patient Details  Name: Kristopher Simon MRN: [id number] Date of Birth: [birth year]  Today's Date: 09/22/2018 OT Individual Time: 1330-1430 OT Individual Time Calculation (min): 60 min    Short Term Goals: Week 1:  OT Short Term Goal 1 (Week 1): STG=LTG due to LOS Week 2:     Skilled Therapeutic Interventions/Progress Updates:    Pt eating lunch upon OT arrival,  Ppt verbalized minimal pain in right deltoid region.  Pt's mom present.  Agreed to engage in OT treatment.  Practiced car transfer with pt needing min verbal cues for safety and min assist for transitional movemnts.  Practiced standing at Hi lo table for 55min 20 minutes.  PPt propelled wc to room and left with all needs in place  Therapy Documentation Precautions:  Precautions Precautions: Fall Restrictions Weight Bearing Restrictions: Yes LLE Weight Bearing: Non weight bearing   Therapy/Group: Individual Therapy  Lisa Roca 09/22/2018, 1:50 PM

## 2018-09-23 ENCOUNTER — Inpatient Hospital Stay (HOSPITAL_COMMUNITY): Payer: Medicaid Other

## 2018-09-23 ENCOUNTER — Inpatient Hospital Stay (HOSPITAL_COMMUNITY): Payer: Medicaid Other | Admitting: Occupational Therapy

## 2018-09-23 LAB — GLUCOSE, CAPILLARY
GLUCOSE-CAPILLARY: 86 mg/dL (ref 70–99)
Glucose-Capillary: 98 mg/dL (ref 70–99)
Glucose-Capillary: 62 mg/dL — ABNORMAL LOW (ref 70–99)
Glucose-Capillary: 67 mg/dL — ABNORMAL LOW (ref 70–99)
Glucose-Capillary: 81 mg/dL (ref 70–99)
Glucose-Capillary: 72 mg/dL (ref 70–99)
Glucose-Capillary: 108 mg/dL — ABNORMAL HIGH (ref 70–99)
Glucose-Capillary: 131 mg/dL — ABNORMAL HIGH (ref 70–99)

## 2018-09-23 MED ORDER — METOPROLOL TARTRATE 50 MG PO TABS
75.0000 mg | ORAL_TABLET | Freq: Two times a day (BID) | ORAL | Status: DC
  Administered 2018-09-23 – 2018-09-26 (×6): 75 mg via ORAL
  Filled 2018-09-23 (×6): qty 1

## 2018-09-23 NOTE — Progress Notes (Signed)
Hypoglycemic Event  CBG: 67  Treatment: juice/fruits  Symptoms: none  Follow-up CBG: Time:1409 CBG Result:81  Possible Reasons for Event: maybe meds  Comments/MD notified:protocol followed    Jillyn Ledger

## 2018-09-23 NOTE — Progress Notes (Signed)
Occupational Therapy Session Note  Patient Details  Name: Kristopher Simon MRN: [id number] Date of Birth: 03-29-1966  Today's Date: 09/23/2018 OT Individual Time: 1430-1529 OT Individual Time Calculation (min): 59 min    Short Term Goals: Week 1:  OT Short Term Goal 1 (Week 1): STG=LTG due to LOS  Skilled Therapeutic Interventions/Progress Updates:    Pt greeted in gym via PT handoff. Started with pt retapping limb rest with supervision/setup for increased comfort in w/c. He ambulated short distance to w/c using RW with supervision assist and wound vac mgt. Pt self propelled to room and completed shaving with setup w/c level. Initiated education regarding using mindfulness meditations for improving quality of sleep while using electronic device. During education, pt reported he was becoming more clammy, and knew his sugar was dropping. Retrieved RN staff to assess blood sugar and pt ate a snack while waiting for them to arrive. Provided calming cues to decrease feelings of anxiety at this time. Pt left via PT handoff.   Pt reported visit from amputee support group made his day, and that he also enjoyed meeting people at dance group. Discussed using social participation as a means for him to strengthen psychosocial health.   Therapy Documentation Precautions:  Precautions Precautions: Fall Restrictions Weight Bearing Restrictions: Yes LLE Weight Bearing: Non weight bearing Pain: No s/s pain during tx    ADL:     Therapy/Group: Individual Therapy  Jake Goodson A Perris Tripathi 09/23/2018, 4:14 PM

## 2018-09-23 NOTE — Progress Notes (Signed)
Hypoglycemic Event  CBG: 62  Treatment: lunch  Symptoms: none  Follow-up CBG: Time:1334 CBG Result:67  Possible Reasons for Event: medications probably  Comments/MD notified:followed protocol    Jillyn Ledger

## 2018-09-23 NOTE — Progress Notes (Signed)
Physical Therapy Session Note  Patient Details  Name: Kristopher Simon MRN: [id number] Date of Birth: [birth year]  Today's Date: 09/23/2018 PT Individual Time: 1400-1430 and 1600-1630 PT Individual Time Calculation (min): 30 min and 30 min   Short Term Goals: Week 1:  PT Short Term Goal 1 (Week 1): STG=LTG due to ELOS  Skilled Therapeutic Interventions/Progress Updates:    Session 1: Pt seated in recliner upon PT arrival, agreeable to therapy tx and denies pain. Pt performed squat pivot to w/c with min guard assist and propelled w/c to the gym x 150 ft with supervision. Pt performed stand pivot to mat with CGA, verbal cues for safety. Pt worked on standing balance task with RW while tossing horseshoes x 2 trials, supervision. Pt performed seated LAQ 2 x 10 bilaterally, pt performed L hamstring stretch 2 x 30 sec. Amputee peer supporters present towards end of session to meet with the pt and discuss experiences. Pt left seated edge of mat for next therapy session.   Session 2: Pt seated in w/c upon PT arrival, agreeable to therapy tx and denies pain. Pt reports having low blood sugars this afternoon and requesting to limit physical activity. Therapist transported pt off unit outside. Pt worked on Geneticist, molecular w/c outside on [employer] x 50 ft, supervision. Pt seated in w/c while therapist provided limb loss education, discussed prosthetic expectations/timeline and discussed discharge planning/barriers. Pt propelled w/c back to unit and transferred to recliner with supervision squat pivot. Pt left with needs in reach.    Therapy Documentation Precautions:  Precautions Precautions: Fall Restrictions Weight Bearing Restrictions: Yes LLE Weight Bearing: Non weight bearing    Therapy/Group: Individual Therapy  Netta Corrigan, PT, DPT 09/23/2018, 12:11 PM

## 2018-09-23 NOTE — Progress Notes (Signed)
Occupational Therapy Session Note  Patient Details  Name: Kristopher Simon MRN: [id number] Date of Birth: 1966-05-10  Today's Date: 09/23/2018 OT Individual Time: 1003-1100 OT Individual Time Calculation (min): 57 min   Short Term Goals: Week 1:  OT Short Term Goal 1 (Week 1): STG=LTG due to LOS  Skilled Therapeutic Interventions/Progress Updates:    Pt greeted in recliner, premedicated for residual limb pain. Verbalized interest in going off of unit to read about [facility] History in atrium. Stand pivot<w/c completed with supervision and assist for wound vac. While seated, he completed oral care and grooming tasks with setup and increased time. When pt finished, he self propelled to atrium for UB strengthening. Pt standing with RW, and ambulating to read history information on wall with supervision assist. Able to stand for 3 minutes 40 seconds without rest! Hands on practice with donning his leg/limb rests, with pt able to manage them with min-mod vcs due to low frustration tolerance.  Afterwards pt self propelled back to unit, and then self propelled to dayroom for dance group.   Therapy Documentation Precautions:  Precautions Precautions: Fall Restrictions Weight Bearing Restrictions: Yes LLE Weight Bearing: Non weight bearing Pain: Stated above   ADL:       Therapy/Group: Individual Therapy  Kristopher Simon A Dalaysia Harms 09/23/2018, 12:52 PM

## 2018-09-23 NOTE — Progress Notes (Signed)
[facility] PHYSICAL MEDICINE & REHABILITATION PROGRESS NOTE  Subjective/Complaints:  Patient less anxious.  ROS: Denies CP, shortness of breath, nausea, vomiting, diarrhea.  Objective: Vital Signs: Blood pressure (!) 167/70, pulse 79, temperature 98.1 F (36.7 C), temperature source Oral, resp. rate 12, height 6\' 5"  (1.956 m), SpO2 97 %. No results found. No results for input(s): WBC, HGB, HCT, PLT in the last 72 hours. No results for input(s): NA, K, CL, CO2, GLUCOSE, BUN, CREATININE, CALCIUM in the last 72 hours.  Physical Exam: BP (!) 167/70 (BP Location: Right Arm)   Pulse 79   Temp 98.1 F (36.7 C) (Oral)   Resp 12   Ht 6\' 5"  (1.956 m)   SpO2 97%   BMI 46.64 kg/m  Constitutional: No distress . Vital signs reviewed. HENT: Normocephalic.  Atraumatic. Eyes: EOMI. No discharge. Cardiovascular: RRR. No JVD. Respiratory: CTA Bilaterally. Normal effort. GI: BS +. Non-distended. Musc: No edema or tenderness in extremities. Musculoskeletal: L-BKA with compressive coban dressing and wound VAC in place. Neurological: He is alert and oriented to person, place, and time.  Motor: B/l UE: 5/5 proximal to to distal RLE: 5/5 , stable LLE: HF: 4+/5 (pain inhibition), stable Skin:  RLE multiple scabs on tibia and dry flaky skin with callused area lateral foot  R- foot with dry flaky skin.   Callused ulcer right lateral foot.   Psychiatric: He has a normal mood and affect. His behavior is normal.   Assessment/Plan: 1. Functional deficits secondary to left BKA which require 3+ hours per day of interdisciplinary therapy in a comprehensive inpatient rehab setting.  Physiatrist is providing close team supervision and 24 hour management of active medical problems listed below.  Physiatrist and rehab team continue to assess barriers to discharge/monitor patient progress toward functional and medical goals  Care Tool:  Bathing    Body parts bathed by patient: Right arm, Face, Left  arm, Chest, Abdomen, Front perineal area, Right upper leg, Left upper leg, Buttocks   Body parts bathed by helper: Buttocks, Right lower leg Body parts n/a: Left lower leg   Bathing assist Assist Level: Minimal Assistance - Patient > 75%     Upper Body Dressing/Undressing Upper body dressing   What is the patient wearing?: Pull over shirt    Upper body assist Assist Level: Set up assist    Lower Body Dressing/Undressing Lower body dressing      What is the patient wearing?: Pants     Lower body assist Assist for lower body dressing: Minimal Assistance - Patient > 75%     Toileting Toileting    Toileting assist Assist for toileting: Contact Guard/Touching assist     Transfers Chair/bed transfer  Transfers assist     Chair/bed transfer assist level: Moderate Assistance - Patient 50 - 74%     Locomotion Ambulation   Ambulation assist      Assist level: Contact Guard/Touching assist Assistive device: Walker-rolling Max distance: 50   Walk 10 feet activity   Assist     Assist level: Contact Guard/Touching assist Assistive device: Walker-rolling   Walk 50 feet activity   Assist Walk 50 feet with 2 turns activity did not occur: Safety/medical concerns  Assist level: Contact Guard/Touching assist Assistive device: Walker-rolling    Walk 150 feet activity   Assist Walk 150 feet activity did not occur: Safety/medical concerns         Walk 10 feet on uneven surface  activity   Assist Walk 10 feet on uneven  surfaces activity did not occur: Safety/medical concerns         Wheelchair     Assist Will patient use wheelchair at discharge?: Yes Type of Wheelchair: Manual    Wheelchair assist level: Supervision/Verbal cueing Max wheelchair distance: 75    Wheelchair 50 feet with 2 turns activity    Assist        Assist Level: Supervision/Verbal cueing   Wheelchair 150 feet activity     Assist     Assist Level:  Supervision/Verbal cueing      Medical Problem List and Plan: 1.  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  CIR PT, OT 2.  DVT Prophylaxis/Anticoagulation: Pharmaceutical: Lovenox 3. Chronic pain/Pain Management: On MS contin 60 mg bid with oxycodone prn.   4. Mood: LCSW to follow for evaluation and support  See #12, Neuropsych to see Increased Celexa to 30 mg/day on Atarax 10 mg 3 times daily continues on Klonopin 0.25 mg twice daily 5. Neuropsych: This patient is capable of making decisions on his own behalf. 6. Skin/Wound Care: Continue compressive dressing with wound VAC 1-2 weeks per surgeon. POD #9 will remove next week 7. Fluids/Electrolytes/Nutrition: Monitor I/O.  8. HTN: Monitor BP bid--continue hydralazine tid. OFF Norvasc, Lasix and Imdur at this time.    Vitals:   09/22/18 2205 09/23/18 0431  BP: (!) 156/74 (!) 167/70  Pulse: 80 79  Resp:  12  Temp:  98.1 F (36.7 C)  SpO2:  97%  Will increase beta-blocker 9. H/o CAD:  Stable on Metoprolol, Plavix and ASA. Resume Lopid.   10. T2DM poorly controlled: Was on Victoza 1.8, Tresiba 154 Units  and novolog 60 units? Changed back to insulin glargine 72 units bid and resume Victoza. Continue Novolog 20 units tid ac meal coverage.   Monitor BS ac/hs and use SSI for tighter control.   Monitor with increased mobility 11. Insensate diabetic neuropathy: Continue gabapentin 300 mg tid.  12. H/o Panic attacks: Klonopin changed to BID as needed on 1/28, neuropsych, pt took Celexa at home, pt is on 20mg  per day may increase dose to 30mg  13. Constipation: Increased Miralax to bid.    Consider further medication adjustments tomorrow if patient still has not had a bowel movement, none recorded on 1/28, several BM 1/29 14. Morbid obesity: Bariatric bed. Educate patient on appropriate diet and weight loss to help promote health and mobility.  15. ABLA:   Hemoglobin 10.6 on 1/28  Continue to  monitor 16. Hyponatremia:   Sodium 136 on 1/28  Continue to monitor 17. Low calorie malnutrition: Added prostat for healing.  18.  Leukocytosis  WBCs 10.8 on 1/28  Continue to monitor 19.  Diabetes controlled but CBGs have been on low side CBG (last 3)  Recent Labs    09/22/18 1954 09/22/18 2128 09/23/18 0629  GLUCAP 93 105* 98  Controlled, monitor for hypoglycemia. LOS: 6 days A FACE TO [place] E  09/23/2018, 8:47 AM

## 2018-09-24 ENCOUNTER — Inpatient Hospital Stay (HOSPITAL_COMMUNITY): Payer: Medicaid Other

## 2018-09-24 LAB — BASIC METABOLIC PANEL
Anion gap: 9 (ref 5–15)
BUN: 13 mg/dL (ref 6–20)
CO2: 27 mmol/L (ref 22–32)
Calcium: 8.9 mg/dL (ref 8.9–10.3)
Chloride: 102 mmol/L (ref 98–111)
Creatinine, Ser: 1.28 mg/dL — ABNORMAL HIGH (ref 0.61–1.24)
GFR calc Af Amer: >60 mL/min (ref >60)
GFR calc non Af Amer: >60 mL/min (ref >60)
Glucose, Bld: 170 mg/dL — ABNORMAL HIGH (ref 70–99)
Potassium: 4.9 mmol/L (ref 3.5–5.1)
Sodium: 138 mmol/L (ref 135–145)

## 2018-09-24 LAB — GLUCOSE, CAPILLARY
Glucose-Capillary: 101 mg/dL — ABNORMAL HIGH (ref 70–99)
Glucose-Capillary: 134 mg/dL — ABNORMAL HIGH (ref 70–99)
Glucose-Capillary: 98 mg/dL (ref 70–99)
Glucose-Capillary: 62 mg/dL — ABNORMAL LOW (ref 70–99)
Glucose-Capillary: 117 mg/dL — ABNORMAL HIGH (ref 70–99)
Glucose-Capillary: 147 mg/dL — ABNORMAL HIGH (ref 70–99)

## 2018-09-24 LAB — CBC
HCT: 37.2 % — ABNORMAL LOW (ref 39.0–52.0)
Hemoglobin: 10.8 g/dL — ABNORMAL LOW (ref 13.0–17.0)
MCH: 25.1 pg — ABNORMAL LOW (ref 26.0–34.0)
MCHC: 29 g/dL — ABNORMAL LOW (ref 30.0–36.0)
MCV: 86.3 fL (ref 80.0–100.0)
Platelets: 439 10*3/uL — ABNORMAL HIGH (ref 150–400)
RBC: 4.31 MIL/uL (ref 4.22–5.81)
RDW: 17.1 % — ABNORMAL HIGH (ref 11.5–15.5)
WBC: 10.8 10*3/uL — AB (ref 4.0–10.5)
nRBC: 0 % (ref 0.0–0.2)

## 2018-09-24 NOTE — Progress Notes (Signed)
[facility] PHYSICAL MEDICINE & REHABILITATION PROGRESS NOTE  Subjective/Complaints:  No issues overnite, discussed desensitization, no blood draw yet this am  ROS: Denies CP, shortness of breath, nausea, vomiting, diarrhea.  Objective: Vital Signs: Blood pressure 140/72, pulse 83, temperature 98.1 F (36.7 C), temperature source Oral, resp. rate 16, height 6\' 5"  (1.956 m), SpO2 98 %. No results found. No results for input(s): WBC, HGB, HCT, PLT in the last 72 hours. No results for input(s): NA, K, CL, CO2, GLUCOSE, BUN, CREATININE, CALCIUM in the last 72 hours.  Physical Exam: BP 140/72 (BP Location: Right Arm)   Pulse 83   Temp 98.1 F (36.7 C) (Oral)   Resp 16   Ht 6\' 5"  (1.956 m)   SpO2 98%   BMI 46.64 kg/m  Constitutional: No distress . Vital signs reviewed. HENT: Normocephalic.  Atraumatic. Eyes: EOMI. No discharge. Cardiovascular: RRR. No JVD. Respiratory: CTA Bilaterally. Normal effort. GI: BS +. Non-distended. Musc: No edema or tenderness in extremities. Musculoskeletal: L-BKA with compressive coban dressing and wound VAC in place. Neurological: He is alert and oriented to person, place, and time.  Motor: B/l UE: 5/5 proximal to to distal RLE: 5/5 , stable LLE: HF: 4+/5 (pain inhibition), stable Skin:  RLE multiple scabs on tibia and dry flaky skin with callused area lateral foot  R- foot with dry flaky skin.   Callused ulcer right lateral foot.   Psychiatric: He has a normal mood and affect. His behavior is normal.   Assessment/Plan: 1. Functional deficits secondary to left BKA which require 3+ hours per day of interdisciplinary therapy in a comprehensive inpatient rehab setting.  Physiatrist is providing close team supervision and 24 hour management of active medical problems listed below.  Physiatrist and rehab team continue to assess barriers to discharge/monitor patient progress toward functional and medical goals  Care Tool:  Bathing  Bathing  activity did not occur: Refused Body parts bathed by patient: Right arm, Face, Left arm, Chest, Abdomen, Front perineal area, Right upper leg, Left upper leg, Buttocks   Body parts bathed by helper: Buttocks, Right lower leg Body parts n/a: Left lower leg   Bathing assist Assist Level: Minimal Assistance - Patient > 75%     Upper Body Dressing/Undressing Upper body dressing   What is the patient wearing?: Pull over shirt    Upper body assist Assist Level: Set up assist    Lower Body Dressing/Undressing Lower body dressing      What is the patient wearing?: Pants     Lower body assist Assist for lower body dressing: Minimal Assistance - Patient > 75%     Toileting Toileting Toileting Activity did not occur Landscape architect and hygiene only): Refused  Toileting assist Assist for toileting: Contact Guard/Touching assist     Transfers Chair/bed transfer  Transfers assist     Chair/bed transfer assist level: Contact Guard/Touching assist     Locomotion Ambulation   Ambulation assist      Assist level: Contact Guard/Touching assist Assistive device: Walker-rolling Max distance: 50   Walk 10 feet activity   Assist     Assist level: Contact Guard/Touching assist Assistive device: Walker-rolling   Walk 50 feet activity   Assist Walk 50 feet with 2 turns activity did not occur: Safety/medical concerns  Assist level: Contact Guard/Touching assist Assistive device: Walker-rolling    Walk 150 feet activity   Assist Walk 150 feet activity did not occur: Safety/medical concerns         Walk  10 feet on uneven surface  activity   Assist Walk 10 feet on uneven surfaces activity did not occur: Safety/medical concerns         Wheelchair     Assist Will patient use wheelchair at discharge?: Yes Type of Wheelchair: Manual    Wheelchair assist level: Supervision/Verbal cueing Max wheelchair distance: 150    Wheelchair 50 feet with 2  turns activity    Assist        Assist Level: Supervision/Verbal cueing   Wheelchair 150 feet activity     Assist     Assist Level: Supervision/Verbal cueing      Medical Problem List and Plan: 1.  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  CIR PT, OT 2.  DVT Prophylaxis/Anticoagulation: Pharmaceutical: Lovenox 3. Chronic pain/Pain Management: On MS contin 60 mg bid with oxycodone prn.   4. Mood: LCSW to follow for evaluation and support  See #12, Neuropsych to see Increased Celexa to 30 mg/day on Atarax 10 mg 3 times daily continues on Klonopin 0.25 mg twice daily 5. Neuropsych: This patient is capable of making decisions on his own behalf. 6. Skin/Wound Care: Continue compressive dressing with wound VAC 1-2 weeks per surgeon. POD #9 will remove this week 7. Fluids/Electrolytes/Nutrition: Monitor I/O.  8. HTN: Monitor BP bid--continue hydralazine tid. OFF Norvasc, Lasix and Imdur at this time.    Vitals:   09/23/18 2141 09/24/18 0626  BP: (!) 170/83 140/72  Pulse:  83  Resp:  16  Temp:  98.1 F (36.7 C)  SpO2:  98%  increased metoprolol to 75mg  BID on 2/2, HR improved this am 9. H/o CAD:  Stable on Metoprolol, Plavix and ASA. Resume Lopid.   10. T2DM poorly controlled: Was on Victoza 1.8, Tresiba 154 Units  and novolog 60 units? Changed back to insulin glargine 72 units bid and resume Victoza. Continue Novolog 20 units tid ac meal coverage.   Monitor BS ac/hs and use SSI for tighter control.   Monitor with increased mobility 11. Insensate diabetic neuropathy: Continue gabapentin 300 mg tid.  12. H/o Panic attacks: Klonopin changed to BID as needed on 1/28, neuropsych, pt took Celexa at home, pt is on 20mg  per day may increase dose to 30mg  13. Constipation: Increased Miralax to bid.    Consider further medication adjustments tomorrow if patient still has not had a bowel movement, none recorded on 1/28, several BM  1/29 14. Morbid obesity: Bariatric bed. Educate patient on appropriate diet and weight loss to help promote health and mobility.  15. ABLA:   Hemoglobin 10.6 on 1/28, labs for today  Continue to monitor 16. Hyponatremia:   Sodium 136 on 1/28, labs for today  Continue to monitor 17. Low calorie malnutrition: Added prostat for healing.  18.  Leukocytosis  WBCs 10.8 on 1/28  Continue to monitor 19.  Diabetes controlled but CBGs have been on low side CBG (last 3)  Recent Labs    09/23/18 1935 09/23/18 2153 09/24/18 0648  GLUCAP 108* 131* 101*  Controlled, monitor for hypoglycemia. LOS: 7 days A FACE TO [place] [facility] 09/24/2018, 7:40 AM

## 2018-09-24 NOTE — Progress Notes (Addendum)
Physical Therapy Session Note  Patient Details  Name: Kristopher Simon MRN: [id number] Date of Birth: 10-09-1965  Today's Date: 09/24/2018 PT Individual Time: 1045-1210 PT Individual Time Calculation (min): 85 min   Short Term Goals: Week 1:  PT Short Term Goal 1 (Week 1): STG=LTG due to ELOS      Skilled Therapeutic Interventions/Progress Updates:  Pt sitting up in w/c.  W/c propulsion using bil UEs x 200' with supervision.  W/c parts mgt throughout session with min assist to remove and replace legrests.  PT placed coban on levers to unlock legrests to facilitate pt feeling them; body habitus prevents him from leaning far enough laterally to see them. Pt reported that bil shoulders are sore; he feels this is from using bil arms to pull himself in bed, using rails. PT suggested ambulating with heavy reliance on UEs may also be a factor.  PT informed pt that ambulation distance on his LTGs has been decreased from 77' to 30'.    Strengthening exercoses from w/c level, using Kinetron at level 30 x 25 cycles targeting quadriceps, at level 40 x 25 cycles x 2 targeting gluteal muscles, in slight trunk flexion with bil hands on hand grips.    Stand pivot w/c > mat with RW, supervision.  With wedge under trunk, pt bridged up (with L hip flexion also) x 5 with hands flat on mat.  In R/L side lying on wedge, L/R hip abduction with hip and knee flexion, x 12 each.  Also in L side lying, passive hip extension for L hip flexor stretch, x 5 minutes.  Pt stated that he uses a RW at home which he bought himself.  He stated that it has 2 wheels on the front.  PT requested that he have a family member bring it in ASAP to verify that it is a bariatric RW.  Pt also needs a w/c for community access.   Gait x 15' with RW , including side stepping through narrow area between obstacles, close supervision, which was a new task for him.  Pt's L residual limb began to shake visibly, and pt asked to sit down.  After sitting,  shaking resolved.  Pt stated that this happened this AM as well, which he thought was a panic attack.    Stand pivot transfer w/c> recliner ; needs left at hand.  PT consulted with RN about shaking.    Therapy Documentation Precautions:  Precautions Precautions: Fall Restrictions Weight Bearing Restrictions: Yes LLE Weight Bearing: Non weight bearing   Pain: Pain Assessment Pain Scale: 0-10 Pain Score: 3 ; premedicated    Therapy/Group: Individual Therapy  Shonna Deiter 09/24/2018, 12:29 PM

## 2018-09-24 NOTE — Progress Notes (Signed)
Occupational Therapy Session Note  Patient Details  Name: Kristopher Simon MRN: [id number] Date of Birth: Sep 24, 1965  Today's Date: 09/24/2018 OT Individual Time: 6734-1937 Session 2: 9024-0973 OT Individual Time Calculation (min): 85 min Session 2: 60 min   Short Term Goals: Week 1:  OT Short Term Goal 1 (Week 1): STG=LTG due to LOS  Skilled Therapeutic Interventions/Progress Updates:    Session focused on bathing and dressing tasks at sit <> stand. Pt c/o pain 3/10 in his L leg. Pt completed UB bathing with set up. CGA for standing level peri hygiene. Pt became increasingly anxious and frantic, sitting abruptly and quickly moving around room, raising voice with therapist. Soothing tone and calming cues provided to attempt and calm pt down. Door re-opened to reduce feelings of claustrophobia. After several minutes pt was able to calm down. Pt demanded his blood sugar to be checked and RN/NT entered room to check blood sugar- all VSS and no need for intervention. Pt given an extended rest break to eat breakfast and to relax. Suggested moving to hallway for change of environment, pt agreed. Pt completed 150 ft of w/c propulsion x2 with (S). In the day room pt sat and opened up to therapist re past, mental health struggles, and current feelings re condition. Insight provided re importance of speaking with a trained therapist and using social supports/participation for continued support. Pt thankful for therapist listening and returned to room, all needs met.   Session 2: Pt received sitting up in recliner with no c/o pain. Pt completed SPT with RW with CGA. Instruction and demonstration provided re use of TTB. Care coordination with CSW re equipment ordering with pt present. Pt completed 250 ft of w/c propulsion with (S) to outside the hospital. Instruction provided on navigating different thresholds and elevators. Pt completed static standing with RW with cueing for positioning LLE. Discussed d/c planning  and strategies to reduce anxiety with pt. Pt returned inside and completed SPT without AD to recliner with (S). Pt left sitting up with all needs met. No c/o pain.   Therapy Documentation Precautions:  Precautions Precautions: Fall Restrictions Weight Bearing Restrictions: Yes LLE Weight Bearing: Non weight bearing   Vital Signs: Therapy Vitals Temp: 98.1 F (36.7 C) Temp Source: Oral Pulse Rate: 83 Resp: 16 BP: 140/72 Patient Position (if appropriate): Sitting Oxygen Therapy SpO2: 98 % O2 Device: Room Air Pain:  As described above   Therapy/Group: Individual Therapy  Curtis Sites 09/24/2018, 9:23 AM

## 2018-09-24 NOTE — Progress Notes (Signed)
Hypoglycemic Event  CBG: 62  Treatment:dinner  Symptoms: none  Follow-up CBG: Time:1830 CBG Result:117  Possible Reasons for Event:unknown  Comments/MD notified:    Joline Maxcy

## 2018-09-24 NOTE — Progress Notes (Signed)
Physical Therapy Discharge Summary  Patient Details  Name: Kristopher Simon MRN: [id number] Date of Birth: 07/01/1966  Today's Date: 09/25/2018 PT Individual Time: 0900-0950 AND 1105-1115 PT Individual Time Calculation (min): 50 min AND 10 min   Session 1:  Pt in recliner and agreeable to therapy, pain as detailed below. Performed functional mobility as detailed below including bed mobility, transfers, gait, and w/c mobility. Instructed pt on HEP for L residual limb strengthening and ROM. Performed 1 set of 10 of supine SLR, supine abduction slide, supine knee-to-chest, supine adduction squeeze, seated knee march, and seated LAQs. Pt performed correctly and w/o pain. Upon returning to room, pt w/ increased anxiety and panic. Self-propelled w/c out of room stating "I need to wheel around in the hallway, I can't be in my room right now". But unable to be redirected, RN made aware and pt has verbalized understanding importance of remaining on unit. Missed 10 min of skilled PT 2/2 refusal.   Session 2:  Pt in w/c and agreeable to make up time from earlier session. Pt apologetic for his reaction in earlier session. Provided w/ visual handout of exercises performed earlier in day and instructed pt on performing 3 sets of 10 each, 1-2x per day. Additionally educated on residual limb desensitization techniques. Pt verbalized understanding of all education, ended session in w/c and all needs in reach.   Patient has met 11 of 11 long term goals due to improved activity tolerance, improved balance, increased strength, increased range of motion, ability to compensate for deficits and functional use of  right upper extremity, right lower extremity and left upper extremity.  Patient to discharge at a wheelchair modified independent level.   Patient's care partner not needed as pt is independent for mobility and locomotion.  Reasons goals not met: n/a  Recommendation:  Patient will benefit from ongoing skilled PT  services in home health setting to continue to advance safe functional mobility, address ongoing impairments in activity tolerance, balance, mobility and locomotion, amputee education, flexibility, and minimize fall risk.  Equipment: 24" x 18" w/c, basic cushion  Reasons for discharge: treatment goals met and discharge from hospital  Patient/family agrees with progress made and goals achieved: Yes  PT Discharge Precautions/Restrictions Precautions Precautions: Fall Restrictions Weight Bearing Restrictions: Yes LLE Weight Bearing: Non weight bearing Vital Signs Therapy Vitals Temp: 98.4 F (36.9 C) Temp Source: Oral Pulse Rate: 76 Resp: 17 BP: (!) 145/69 Patient Position (if appropriate): Lying Oxygen Therapy SpO2: 98 % O2 Device: Room Air Pain Pain Assessment Pain Scale: 0-10 Pain Score: 3  Pain Type: Surgical pain Pain Location: Leg Pain Orientation: Left Pain Descriptors / Indicators: Aching Pain Frequency: Intermittent Pain Onset: On-going Patients Stated Pain Goal: 1 Pain Intervention(s): Medication (See eMAR) Vision/Perception  Perception Perception: Within Functional Limits Praxis Praxis: Intact  Cognition Overall Cognitive Status: History of cognitive impairments - at baseline Arousal/Alertness: Awake/alert Orientation Level: Oriented X4 Awareness: Appears intact Problem Solving: Appears intact Behaviors: Other (comment)(panic and anxiety at baseline) Safety/Judgment: Appears intact Sensation Sensation Light Touch: Impaired by gross assessment Light Touch Impaired Details: Impaired RUE;Impaired LUE(baseline CVA) Coordination Gross Motor Movements are Fluid and Coordinated: No Fine Motor Movements are Fluid and Coordinated: Yes Coordination and Movement Description: Impaired due to new L BKA Motor  Motor Motor: Within Functional Limits Motor - Discharge Observations: generalized weakness  Mobility Bed Mobility Bed Mobility: Rolling  Right;Rolling Left;Supine to Sit;Sit to Supine Rolling Right: Independent with assistive device Rolling Left: Independent with assistive device Supine to  Sit: Independent with assistive device Sit to Supine: Independent with assistive device Transfers Transfers: Sit to Stand;Stand to Sit;Stand Pivot Transfers Sit to Stand: Independent with assistive device Stand to Sit: Independent with assistive device Stand Pivot Transfers: Independent with assistive device Transfer (Assistive device): Rolling walker Locomotion  Gait Ambulation: Yes Gait Assistance: Supervision/Verbal cueing Gait Distance (Feet): 30 Feet Assistive device: Rolling walker Gait Gait: Yes Gait Pattern: Impaired Gait Pattern: Step-to pattern(L BKA) Stairs / Additional Locomotion Stairs: No Wheelchair Mobility Wheelchair Mobility: Yes Wheelchair Assistance: Independent with Camera operator: Both upper extremities Wheelchair Parts Management: Independent Distance: 150'  Trunk/Postural Assessment  Cervical Assessment Cervical Assessment: Exceptions to WFL(forward head) Thoracic Assessment Thoracic Assessment: Exceptions to WFL(rounded shoulders) Lumbar Assessment Lumbar Assessment: Exceptions to WFL(posterior pelvic tilt) Postural Control Postural Control: Deficits on evaluation(impaired in dynamic stance 2/2 new L BKA)  Balance Balance Balance Assessed: Yes Dynamic Sitting Balance Dynamic Sitting - Balance Support: During functional activity;Feet supported Dynamic Sitting - Level of Assistance: 6: Modified independent (Device/Increase time) Static Standing Balance Static Standing - Balance Support: During functional activity;Right upper extremity supported;Left upper extremity supported Static Standing - Level of Assistance: 6: Modified independent (Device/Increase time) Dynamic Standing Balance Dynamic Standing - Balance Support: During functional activity;Right upper extremity  supported;Left upper extremity supported Dynamic Standing - Level of Assistance: 5: Stand by assistance Extremity Assessment  RLE Assessment RLE Assessment: Within Functional Limits LLE Assessment LLE Assessment: Exceptions to [facility] Passive Range of Motion (PROM) Comments: hip WFL, knee flexion limited by edema, however functional  General Strength Comments: hip and knee 4 to 5/5 w/o pain     Jordyne Poehlman K Sanay Belmar 09/25/2018, 9:22 AM

## 2018-09-25 ENCOUNTER — Inpatient Hospital Stay (HOSPITAL_COMMUNITY): Payer: Medicaid Other | Admitting: Physical Therapy

## 2018-09-25 ENCOUNTER — Inpatient Hospital Stay (HOSPITAL_COMMUNITY): Payer: Medicaid Other | Admitting: Psychology

## 2018-09-25 ENCOUNTER — Telehealth (INDEPENDENT_AMBULATORY_CARE_PROVIDER_SITE_OTHER): Payer: Self-pay | Admitting: Orthopedic Surgery

## 2018-09-25 ENCOUNTER — Telehealth (INDEPENDENT_AMBULATORY_CARE_PROVIDER_SITE_OTHER): Payer: Self-pay

## 2018-09-25 ENCOUNTER — Inpatient Hospital Stay (HOSPITAL_COMMUNITY): Payer: Medicaid Other | Admitting: Occupational Therapy

## 2018-09-25 ENCOUNTER — Inpatient Hospital Stay (HOSPITAL_COMMUNITY): Payer: Medicaid Other

## 2018-09-25 ENCOUNTER — Telehealth: Payer: Self-pay | Admitting: *Deleted

## 2018-09-25 DIAGNOSIS — F411 Generalized anxiety disorder: Secondary | ICD-10-CM

## 2018-09-25 LAB — GLUCOSE, CAPILLARY
Glucose-Capillary: 111 mg/dL — ABNORMAL HIGH (ref 70–99)
Glucose-Capillary: 133 mg/dL — ABNORMAL HIGH (ref 70–99)
Glucose-Capillary: 75 mg/dL (ref 70–99)
Glucose-Capillary: 144 mg/dL — ABNORMAL HIGH (ref 70–99)

## 2018-09-25 MED ORDER — ENOXAPARIN SODIUM 100 MG/ML ~~LOC~~ SOLN
90.0000 mg | SUBCUTANEOUS | Status: DC
  Filled 2018-09-25: qty 0.9

## 2018-09-25 MED ORDER — OXYCODONE HCL 5 MG PO TABS
5.0000 mg | ORAL_TABLET | ORAL | Status: DC | PRN
  Administered 2018-09-25 – 2018-09-26 (×5): 5 mg via ORAL
  Filled 2018-09-25 (×5): qty 1

## 2018-09-25 MED ORDER — AMLODIPINE BESYLATE 5 MG PO TABS
5.0000 mg | ORAL_TABLET | Freq: Every day | ORAL | Status: DC
  Administered 2018-09-25 – 2018-09-26 (×2): 5 mg via ORAL
  Filled 2018-09-25 (×2): qty 1

## 2018-09-25 MED ORDER — INSULIN ASPART 100 UNIT/ML ~~LOC~~ SOLN
17.0000 [IU] | Freq: Three times a day (TID) | SUBCUTANEOUS | Status: DC
  Administered 2018-09-25 – 2018-09-26 (×4): 17 [IU] via SUBCUTANEOUS

## 2018-09-25 NOTE — Progress Notes (Signed)
Wound vac removed to L BKA. Mod amount of bleeding to middle incision. Site cleaned with NS and dry drsg applied. Pt resting in recliner with etrem elevated. Will cont to monitor  Erie Noe, LPN

## 2018-09-25 NOTE — Progress Notes (Signed)
Occupational Therapy Discharge Summary  Patient Details  Name: Kristopher Simon MRN: [id number] Date of Birth: May 22, 1966   Patient has met 7 of 8 long term goals due to improved activity tolerance, improved balance, postural control, ability to compensate for deficits and improved coordination.  Patient to discharge at overall Supervision level.  Patient's care partner is independent to provide the necessary physical assistance at discharge.  Pt lives with mother. He reports shecan provide the needed supervision assist at d/c. No formal family education completed with pt's mother.  Recommend sponge bathing at this time until [facility] can assess home bathroom layout and assist with set-up and modifications as needed. Pt voiced understanding to recommendation.   Reasons goals not met: Simple meal prep goal not addressed as not a therapy priority at this time. Pt's mother can assist with meal prep PRN at d/c.   Recommendation:  Patient will benefit from ongoing skilled OT services in home health setting to continue to advance functional skills in the area of BADL, iADL and Reduce care partner burden.  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) Patient Visual Report: No change from baseline Vision Assessment?: No apparent visual deficits Perception  Perception: Within Functional Limits Praxis Praxis: Intact Cognition Overall Cognitive Status: History of cognitive impairments - at baseline Arousal/Alertness: Awake/alert Orientation Level: Oriented X4 Memory: Appears intact Awareness: Appears intact Problem Solving: Appears intact Behaviors: (panic  and anxiety at baseline) Safety/Judgment: Appears intact Sensation Sensation Light Touch: Impaired by gross assessment Light Touch Impaired Details: Impaired RUE;Impaired LUE(Baseline CVA) Additional Comments: decreased to light touch R LE below knee, dimished more distally; difficulty with proprioception L LE Coordination Gross Motor Movements are Fluid and Coordinated: Yes Fine Motor Movements are Fluid and Coordinated: Yes Coordination and Movement Description: Impaired due to new L BKA Motor  Motor Motor: Within Functional Limits Motor - Discharge Observations: generalized weakness Mobility  Bed Mobility Bed Mobility: Rolling Right;Rolling Left;Supine to Sit;Sit to Supine Rolling Right: Independent with assistive device Rolling Left: Independent with assistive device Supine to Sit: Independent with assistive device Sit to Supine: Independent with assistive device Transfers Sit to Stand: Independent with assistive device Stand to Sit: Independent with assistive device  Trunk/Postural Assessment  Cervical Assessment Cervical Assessment: Exceptions to WFL(Forward head) Thoracic Assessment Thoracic Assessment: Exceptions to WFL(Rounded shoulders) Lumbar Assessment Lumbar Assessment: Exceptions to WFL(Posterior pelvic tilt) Postural Control Postural Control: Deficits on evaluation(Impaired 2/2 new L BKA)  Balance Balance Balance Assessed: Yes Dynamic Sitting Balance Dynamic Sitting - Balance Support: During functional activity;Feet supported Dynamic Sitting - Level of Assistance: 6: Modified independent (Device/Increase time) Static Standing Balance Static Standing - Balance Support: During functional activity;Right upper extremity supported;Left upper extremity supported Static Standing - Level of Assistance: 6: Modified independent (Device/Increase time) Static Standing - Comment/# of Minutes: Standing with RW Dynamic Standing Balance Dynamic Standing - Balance Support:  During functional activity;Right upper extremity supported;Left upper extremity supported Dynamic Standing - Level of Assistance: 6: Modified independent (Device/Increase time);5: Stand by assistance Dynamic Standing - Comments: Standing to complete LB bathing/dressing tasks Extremity/Trunk Assessment RUE Assessment RUE Assessment: Within Functional Limits LUE Assessment LUE Assessment: Within Functional Limits General Strength Comments: 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 Able to use in functional  manner   Rounds, Amy L 09/25/2018, 12:34 PM

## 2018-09-25 NOTE — Progress Notes (Signed)
Orthopedic Tech Progress Note Patient Details:  [name] Jul 04, 1966 [id number]  Patient ID: Kristopher Simon, male   DOB: [birth year], 53 y.o.   MRN: [id number]   [place] Pink 09/25/2018, 10:08 AMCalled [facility] for left stump shrinker.

## 2018-09-25 NOTE — Progress Notes (Signed)
Social Work  Discharge Note  The overall goal for the admission was met for:   Discharge location: Yes-HOME WITH MOM AND SISTER TO COME IN AND ASSIST IN THE EVENINGS  Length of Stay: Yes-9 DAYS  Discharge activity level: Yes-MOD/I [place]  Home/community participation: Yes  Services provided included: MD, RD, PT, OT, RN, CM, TR, Pharmacy, Neuropsych and SW  Financial Services: Medicaid  Follow-up services arranged: Home Health: [place] CARE-PT, OT, RN, DME: [place] and Patient/Family has no preference for HH/DME agencies  Comments (or additional information):PT DID WELL AND REACHED HIS GOALS ALTHOUGH HE HAD CONCERNS HE WAS NOT READY TO [place]. LACKS CONFIDENCE BUT IS DOING QUITE WELL. INFORMATION GIVEN TO PT FOR FOLLOW UP COUNSELING FOR PANIC ATTACKS  Patient/Family verbalized understanding of follow-up arrangements: Yes  Individual responsible for coordination of the follow-up plan: SELF & SHERRY-SISTER  Confirmed correct DME delivered: Elease Hashimoto 09/25/2018    Elease Hashimoto

## 2018-09-25 NOTE — Discharge Summary (Addendum)
Physician Discharge Summary  Patient ID: Kristopher Simon MRN: [id number] DOB/AGE: [birth year] 53 y.o.  Admit date: 09/17/2018 Discharge date: 09/26/2018  Discharge Diagnoses:  Principal Problem:   Unilateral complete BKA, right, subsequent encounter [facility]) Active Problems:   Leukocytosis   Drug induced constipation   Diabetic peripheral neuropathy ([place])   Diabetes mellitus type 2 in obese ([place])   Unilateral complete BKA, right, sequela ([place])   Panic disorder with agoraphobia   Generalized anxiety disorder   Discharged Condition: stable   Significant Diagnostic Studies: Dg Knee Complete 4 Views Left  Result Date: 09/26/2018 CLINICAL DATA:  Left leg pain at the amputation site EXAM: LEFT KNEE - COMPLETE 4+ VIEW COMPARISON:  None. FINDINGS: Left below the knee amputation with surgical staples in the soft tissues. No soft tissue emphysema. No acute fracture or dislocation. No periosteal reaction or bone destruction. Moderate medial femorotibial compartment joint space narrowing. Small lateral femorotibial compartment marginal osteophytes. Mild patellofemoral compartment joint space narrowing. No knee joint effusion. IMPRESSION: 1. Below the knee amputation.  No acute osseous abnormality. Electronically Signed   By: Kathreen Devoid   On: 09/26/2018 21:31    Labs:  Basic Metabolic Panel: BMP Latest Ref Rng & Units 09/24/2018 09/18/2018 09/17/2018  Glucose 70 - 99 mg/dL 170(H) 227(H) 276(H)  BUN 6 - 20 mg/dL 13 14 9   Creatinine 0.61 - 1.24 mg/dL 1.28(H) 1.12 1.19  Sodium 135 - 145 mmol/L 138 136 133(L)  Potassium 3.5 - 5.1 mmol/L 4.9 4.4 3.9  Chloride 98 - 111 mmol/L 102 94(L) 95(L)  CO2 22 - 32 mmol/L 27 31 27   Calcium 8.9 - 10.3 mg/dL 8.9 9.2 9.0    CBC: CBC Latest Ref Rng & Units 09/24/2018 09/18/2018 09/16/2018  WBC 4.0 - 10.5 K/uL 10.8(H) 10.8(H) 9.7  Hemoglobin 13.0 - 17.0 g/dL 10.8(L) 10.6(L) 10.8(L)  Hematocrit 39.0 - 52.0 % 37.2(L) 34.9(L) 35.6(L)  Platelets 150 - 400 K/uL 439(H) 447(H)  388    CBG: Recent Labs  Lab 09/25/18 1137 09/25/18 1632 09/25/18 2103 09/26/18 0628 09/26/18 1121  GLUCAP 133* 75 144* 177* 162*    Brief HPI:   Kristopher Simon is a 53 year old male with history of T2DM-poorly controlled, morbid obesity-BMI 73, hypertension, CAD, left foot with Charcot deformity, protein calorie malnutrition and chronic ulcer left foot with increasing pain and edema.  He was admitted on 09/12/2018 via [facility] with concerns of septic arthritis with chronic osteomyelitis.  He was started on IV antibiotics and amputation recommended by Dr. Sharol Given due to massive destruction of talus and calcaneus.  He was taken to the OR on 09/14/2018 for left transtibial amputation and postop wound VAC to continue for 1 to 2 weeks.  Hospital course significant for poorly controlled blood pressures as well as issues with anxiety requiring Klonopin.  Therapy evaluations done revealing functional decline.  CIR recommended for follow-up therapy   Hospital Course: Kristopher Simon was admitted to rehab 09/17/2018 for inpatient therapies to consist of PT and OT at least three hours five days a week. Past admission physiatrist, therapy team and rehab RN have worked together to provide customized collaborative inpatient rehab. His blood pressures have been monitored on a twice daily basis during the stay and noted to be poorly controlled.  Norvasc was resumed, metoprolol was titrated upwards and hydralazine was added for better control. Diabetes has been monitored with AC at bedtime CBG checks.  Victoza was resumed and he was maintained on Levemir 72 mg twice daily with NovoLog tid ac  for meal coverage.  He has had occasional drop in blood sugars to 70's and has refused Levemir at due to this.  He has been educated on importance of compliance with long acting insulin, use NovoLog for meal coverage as well as eating meals consistently. Bowel program has been augmented to help manage OIC.   Wound VAC was  removed on 2 4 and incision was noted to be healing well without signs or symptoms of infection.  Staples and sutures remain in place and wound is clean dry and intact.  Follow-up labs shows hyponatremia has resolved.  Acute blood loss anemia is stable and reactive leukocytosis has resolved.  Pain has been controlled with as needed use of oxycodone and was weaned down to 5 mg  3-4 times a day as needed.  He continued to have elevated levels of anxiety and ego support has been provided by team.  Celexa was increased to 30 mg and Atarax was scheduled on 3 times daily basis.  Neuropsychology has also been following to help educate patient on compensatory strategies.  He has had improvement in activity tolerance and mobility.  He is currently at modified independent at wheelchair level and requires supervision for mobility.  He will continue to receive further follow-up home health PT, OT and RN by  Rehab course: During patient's stay in rehab weekly team conferences were held to monitor patient's progress, set goals and discuss barriers to discharge. At admission, patient required in assist with basic self care tasks and mod assist with mobility. He  has had improvement in activity tolerance, balance, postural control as well as ability to compensate for deficits. He has had improvement in functional use RLE and BUE as well as improvement in awareness.  He is able to complete ADL tasks with supervision. He is modified independent for transfers.  He requires supervision with verbal cues and rolling walker to ambulate 30 feet.  Family education was completed regarding all aspects of mobility and safety.   Disposition:  Home  Diet: Heart healthy/carb modified  Special Instructions: 1.  Monitor blood sugars AC at bedtime and follow up with endocrinology for further titration.  2.  Cleanse incision with soap and water.  Pat dry.  Continue compression with stump shrinker or ace wrap on residual limb  daily.   Discharge Instructions    Ambulatory referral to Physical Medicine Rehab   Complete by:  As directed    1-2 weeks transitional care appt     Allergies as of 09/26/2018      Reactions   Magnesium-containing Compounds Anaphylaxis   Sulfamethoxazole-trimethoprim Swelling, Anaphylaxis   "tongue swelling" "tongue swelling" Pt states tongue swells   Ibuprofen Other (See Comments)   Kidney issues Kidney issues Shuts kidneys down Kidney issues Shuts kidneys down   Pregabalin Swelling      Medication List    STOP taking these medications   aspirin 325 MG tablet Replaced by:  aspirin EC 81 MG tablet   clonazePAM 0.5 MG disintegrating tablet Commonly known as:  KLONOPIN   docusate sodium 100 MG capsule Commonly known as:  COLACE   furosemide 20 MG tablet Commonly known as:  LASIX   gentamicin ointment 0.1 % Commonly known as:  GARAMYCIN   isosorbide mononitrate 60 MG 24 hr tablet Commonly known as:  IMDUR   omeprazole 20 MG capsule Commonly known as:  PRILOSEC     TAKE these medications   acetaminophen 325 MG tablet Commonly known as:  TYLENOL Take 1-2  tablets (325-650 mg total) by mouth every 4 (four) hours as needed for mild pain.   amLODipine 5 MG tablet Commonly known as:  NORVASC Take 1 tablet (5 mg total) by mouth daily. What changed:    medication strength  how much to take   aspirin EC 81 MG tablet Take 1 tablet (81 mg total) by mouth daily. Replaces:  aspirin 325 MG tablet   citalopram 10 MG tablet Commonly known as:  CELEXA Take 3 tablets (30 mg total) by mouth daily. What changed:    medication strength  how much to take   clopidogrel 75 MG tablet Commonly known as:  PLAVIX Take 75 mg by mouth daily.   cyclobenzaprine 5 MG tablet Commonly known as:  FLEXERIL Take 1 tablet (5 mg total) by mouth 3 (three) times daily as needed for muscle spasms. What changed:    medication strength  how much to take  when to take  this  reasons to take this   esomeprazole 40 MG capsule Commonly known as:  NEXIUM Take 1 capsule (40 mg total) by mouth every morning.   gabapentin 300 MG capsule Commonly known as:  NEURONTIN Take 1 capsule (300 mg total) by mouth 3 (three) times daily. What changed:    how much to take  when to take this   gemfibrozil 600 MG tablet Commonly known as:  LOPID Take 1 tablet (600 mg total) by mouth 2 (two) times daily before a meal. What changed:  how much to take   hydrALAZINE 25 MG tablet Commonly known as:  APRESOLINE Take 1 tablet (25 mg total) by mouth every 8 (eight) hours.   hydrocerin Crea Apply 1 application topically 2 (two) times daily. To dry skin on right foot/leg   hydrOXYzine 10 MG tablet Commonly known as:  ATARAX/VISTARIL Take 1 tablet (10 mg total) by mouth 3 (three) times daily. What changed:  when to take this   insulin aspart 100 UNIT/ML injection Commonly known as:  novoLOG Inject 20 Units into the skin 3 (three) times daily with meals.   insulin detemir 100 UNIT/ML injection Commonly known as:  LEVEMIR Inject 0.7 mLs (70 Units total) into the skin 2 (two) times daily. What changed:  how much to take   Metoprolol Tartrate 75 MG Tabs Take 75 mg by mouth 2 (two) times daily. What changed:    medication strength  how much to take   NUCYNTA 100 MG Tabs--120 pills Generic drug:  Tapentadol HCl Take 1 tablet (100 mg total) by mouth every 6 (six) hours. After a week or so --then try weaning to as needed as pain gets better. What changed:    when to take this  reasons to take this  additional instructions   oxyCODONE 5 MG immediate release tablet--Rx # 28 pills Commonly known as:  Oxy IR/ROXICODONE Take 1 tablet (5 mg total) by mouth every 4 (four) hours as needed for severe pain. Max 4 pills/day What changed:    how much to take  reasons to take this  additional instructions   polyethylene glycol packet Commonly known as:   MIRALAX / GLYCOLAX Take 17 g by mouth 2 (two) times daily.   pravastatin 20 MG tablet Commonly known as:  PRAVACHOL Take 20 mg by mouth daily.   saccharomyces boulardii 250 MG capsule Commonly known as:  FLORASTOR Take 1 capsule (250 mg total) by mouth 2 (two) times daily.   traZODone 50 MG tablet Commonly known as:  DESYREL Take  0.5-1 tablets (25-50 mg total) by mouth at bedtime as needed for sleep.   VICTOZA 18 MG/3ML Sopn Generic drug:  liraglutide Inject 0.3 mLs (1.8 mg total) into the skin daily.      Follow-up Information    [name], MD Follow up.   Specialty:  Physical Medicine and Rehabilitation Why:  Office will call you with follow up appointment Contact information: [address] [place] [address] [phone]        Newt Minion, MD. Call.   Specialty:  Orthopedic Surgery Why:  for follow up appointment Contact information: [place] [address] [phone]        [place], Sarah T, MD Follow up.   Specialty:  Family Medicine Contact information: [place] [place] [address] [phone]           Signed: Bary Leriche 09/27/2018, 3:17 PM Patient seen and examined by me on day of discharge. Delice Lesch, MD, ABPMR

## 2018-09-25 NOTE — Telephone Encounter (Signed)
Reesa Chew from [facility] called asked if Dr. Sharol Given or Raquel Sarna can come by the hospital today or tomorrow to see patient. She advised patient's wound is oozing. (wound vac was removed)  She said patient will be discharged tomorrow. The number to contact Olin Hauser is [phone]

## 2018-09-25 NOTE — Progress Notes (Addendum)
[place] PHYSICAL MEDICINE & REHABILITATION PROGRESS NOTE  Subjective/Complaints: Patient seen sitting up in his chair this morning.  He states he slept well overnight.  He has questions if he is ready for discharge tomorrow.  ROS: Denies CP, shortness of breath, nausea, vomiting, diarrhea.  Objective: Vital Signs: Blood pressure (!) 145/69, pulse 76, temperature 98.4 F (36.9 C), temperature source Oral, resp. rate 17, height 6\' 5"  (1.956 m), SpO2 98 %. No results found. Recent Labs    09/24/18 0934  WBC 10.8*  HGB 10.8*  HCT 37.2*  PLT 439*   Recent Labs    09/24/18 0934  NA 138  K 4.9  CL 102  CO2 27  GLUCOSE 170*  BUN 13  CREATININE 1.28*  CALCIUM 8.9    Physical Exam: BP (!) 145/69 (BP Location: Left Arm)   Pulse 76   Temp 98.4 F (36.9 C) (Oral)   Resp 17   Ht 6\' 5"  (1.956 m)   SpO2 98%   BMI 46.64 kg/m  Constitutional: No distress . Vital signs reviewed. HENT: Normocephalic.  Atraumatic. Eyes: EOMI. No discharge. Cardiovascular: RRR.  No JVD. Respiratory: CTA bilaterally.  Normal effort. GI: BS +. Non-distended. Musc: No edema or tenderness in extremities. Musculoskeletal: L-BKA with compressive coban dressing and wound VAC in place, suctioning without leaks. Neurological: He is alert and oriented.  Motor: B/l UE: 5/5 proximal to to distal RLE: 5/5 , stable LLE: HF: 5/5 Skin:  RLE multiple scabs on tibia and dry flaky skin with callused area lateral foot  R- foot with dry flaky skin.   Callused ulcer right lateral foot.   See above Psychiatric: He has a normal mood and affect. His behavior is normal.   Assessment/Plan: 1. Functional deficits secondary to left BKA which require 3+ hours per day of interdisciplinary therapy in a comprehensive inpatient rehab setting.  Physiatrist is providing close team supervision and 24 hour management of active medical problems listed below.  Physiatrist and rehab team continue to assess barriers to  discharge/monitor patient progress toward functional and medical goals  Care Tool:  Bathing  Bathing activity did not occur: Refused Body parts bathed by patient: Right arm, Face, Left arm, Chest, Abdomen, Front perineal area, Right upper leg, Left upper leg, Buttocks, Right lower leg   Body parts bathed by helper: Buttocks, Right lower leg Body parts n/a: Left lower leg   Bathing assist Assist Level: Contact Guard/Touching assist     Upper Body Dressing/Undressing Upper body dressing   What is the patient wearing?: Pull over shirt    Upper body assist Assist Level: Set up assist    Lower Body Dressing/Undressing Lower body dressing      What is the patient wearing?: Pants     Lower body assist Assist for lower body dressing: Contact Guard/Touching assist     Toileting Toileting Toileting Activity did not occur (Clothing management and hygiene only): Refused  Toileting assist Assist for toileting: Contact Guard/Touching assist     Transfers Chair/bed transfer  Transfers assist     Chair/bed transfer assist level: Supervision/Verbal cueing     Locomotion Ambulation   Ambulation assist      Assist level: Supervision/Verbal cueing Assistive device: Walker-rolling Max distance: 30   Walk 10 feet activity   Assist     Assist level: Contact Guard/Touching assist Assistive device: Walker-rolling   Walk 50 feet activity   Assist Walk 50 feet with 2 turns activity did not occur: Safety/medical concerns  Assist  level: Contact Guard/Touching assist Assistive device: Walker-rolling    Walk 150 feet activity   Assist Walk 150 feet activity did not occur: Safety/medical concerns         Walk 10 feet on uneven surface  activity   Assist Walk 10 feet on uneven surfaces activity did not occur: Safety/medical concerns         Wheelchair     Assist Will patient use wheelchair at discharge?: Yes Type of Wheelchair: Manual     Wheelchair assist level: Supervision/Verbal cueing Max wheelchair distance: 200    Wheelchair 50 feet with 2 turns activity    Assist        Assist Level: Supervision/Verbal cueing   Wheelchair 150 feet activity     Assist     Assist Level: Supervision/Verbal cueing      Medical Problem List and Plan: 1.  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  Continue CIR   Plan for d/c tomorrow  Will see patient for transitional care management in 1-2 weeks post-discharge 2.  DVT Prophylaxis/Anticoagulation: Pharmaceutical: Lovenox 3. Chronic pain/Pain Management: On MS contin 60 mg bid with oxycodone prn.   4. Mood: LCSW to follow for evaluation and support  See #12, Neuropsych to see Increased Celexa to 30 mg/day on Atarax 10 mg 3 times daily continues on Klonopin 0.25 mg twice daily 5. Neuropsych: This patient is capable of making decisions on his own behalf. 6. Skin/Wound Care:   D/c VAC today 7. Fluids/Electrolytes/Nutrition: Monitor I/O.  8. HTN: Monitor BP bid--continue hydralazine tid. OFF Norvasc, Lasix and Imdur at this time.    Vitals:   09/24/18 2000 09/25/18 0605  BP: (!) 158/75 (!) 145/69  Pulse: 79 76  Resp: 14 17  Temp: 98.1 F (36.7 C) 98.4 F (36.9 C)  SpO2: 97% 98%   Increased metoprolol to 75mg  BID on 2/2  Norvasc 5 started on 2/4 9. H/o CAD:  Stable on Metoprolol, Plavix and ASA. Resume Lopid.   10. T2DM poorly controlled: Was on Victoza 1.8, Tresiba 154 Units  and novolog 60 units? Changed back to insulin glargine 72 units bid and resumed Victoza.   Novolog 20 units tid ac meal coverage, decreased to 17 3 times daily on 2/4.     Monitor BS ac/hs and use SSI for tighter control.   Labile on 2/4 11. Insensate diabetic neuropathy: Continue gabapentin 300 mg tid.  12. H/o Panic attacks: Klonopin changed to BID as needed on 1/28, neuropsych, pt took Celexa at home, pt is on 20mg  per day may increase  dose to 30mg   13. Constipation: Increased Miralax to bid.    Improved 14. Morbid obesity: Bariatric bed. Educate patient on appropriate diet and weight loss to help promote health and mobility.  15. ABLA:   Hemoglobin 10.8 on 2/3  Continue to monitor 16. Hyponatremia: Resolved  Sodium 138 on 2/3  Continue to monitor 17. Low calorie malnutrition: Added prostat for healing.  18.  Leukocytosis  WBCs 10.8 on 2/3  Continue to monitor  LOS: 8 days A FACE TO FACE EVALUATION WAS PERFORMED   Lorie Phenix 09/25/2018, 7:41 AM

## 2018-09-25 NOTE — Telephone Encounter (Signed)
Reesa Chew PA-C calling from [facility] inpatient rehab asking if someone can come by and look at patient, wound vac removed today and having quite a bit of edema and "oozong"; before discharge tomorrow [phone]

## 2018-09-25 NOTE — Telephone Encounter (Signed)
-----   Message from Bary Leriche, [place] sent at 09/25/2018  8:42 AM EST ----- Good morning, Could one of you help with getting preauthorization for Oxycodone 5 mg one pill qid prn severe pain. Medicaid patient--for discharge tomorrow    PRIMARY: Medicaid of [place]      Policy#: [id number] o

## 2018-09-25 NOTE — Telephone Encounter (Signed)
Prior Auth initiated with [facility] for Oxycodone 5 mg #28.
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
## 2018-09-25 NOTE — Progress Notes (Signed)
Occupational Therapy Session Note  Patient Details  Name: Kristopher Simon MRN: [id number] Date of Birth: 1966/07/21  Today's Date: 09/25/2018 OT Individual Time: 1500-1600 OT Individual Time Calculation (min): 60 min    Short Term Goals: Week 1:  OT Short Term Goal 1 (Week 1): STG=LTG due to LOS  Skilled Therapeutic Interventions/Progress Updates:    Session focused on d/c planning and functional transfers. Pain as reported below. Pt received sitting up in recliner. Pt given verbal instructions on shrinker use and residual limb care. Pt given demonstration re use of his new w/c. Pt returned demo with mod I. Pt completed SPT to w/c with (S). Pt completed car transfer following demo and instruction for UE placement with (S). Pt's w/c was given several adjustments per pt preference and to maximize accessibility. Pt given demo re TTB use and adjusting to fit pt's tub. Pt opted to not practice this again. Pt with multiple questions throughout all demo's today and all questions answered thoroughly. Pt returned to room and to recliner, left sitting up with all needs met.   Therapy Documentation Precautions:  Precautions Precautions: Fall Restrictions Weight Bearing Restrictions: Yes LLE Weight Bearing: Non weight bearing Vital Signs: Therapy Vitals Temp: (!) 97.5 F (36.4 C) Pulse Rate: 84 Resp: 18 BP: (!) 131/58 Patient Position (if appropriate): Sitting Oxygen Therapy SpO2: 99 % O2 Device: Room Air Pain: Pain Assessment Pain Scale: 0-10 Pain Score: 4  Pain Type: Surgical pain Pain Location: Leg Pain Orientation: Left Pain Descriptors / Indicators: Sore Pain Onset: On-going Pain Intervention(s): Rest;Environmental changes   Therapy/Group: Individual Therapy  Curtis Sites 09/25/2018, 5:22 PM

## 2018-09-25 NOTE — Progress Notes (Signed)
Wound examined --incision intact with staples and sutures in place. Moderate edema with flaky skin on flap. Noted to have steady stream of serosanguinous drainage from medial aspect. Compressive dressing applied--will hold Lovenox today. Patient continues to have issues with anxiety and educated that some drainage can be expected.  Will contact Dr. Sharol Given to see if wound can be evaluated prior to discharge tomorrow.

## 2018-09-25 NOTE — Progress Notes (Signed)
Occupational Therapy Session Note  Patient Details  Name: Kristopher Simon MRN: [id number] Date of Birth: 12/03/1965  Today's Date: 09/25/2018 OT Individual Time: 7062-3762 OT Individual Time Calculation (min): 75 min    Short Term Goals: Week 1:  OT Short Term Goal 1 (Week 1): STG=LTG due to LOS  Skilled Therapeutic Interventions/Progress Updates:    Pt seen for OT session focusing on functional transfers, ADL re-training and d/c planning. Pt sitting up in recliner upon arrival with RN present administering AM meds. Pt requesting to complete toileting task. He completed stand pivot transfer to w/c with RW and assist to stabilize equipment. Stand pivot to standard toilet with use of grab bars and completed clothing management at supervision-mod I level standing with support of grab bars. He completed hygiene and returned to w/c mod I. Completed bathing/dressing from w/c level at sink with set-up assist, standing with support of counter while completing pericare/buttock hygiene and LB clothing management. Pt declining to don shoe despite education of importance of maintaining skin integrity and safety with R LE.  Pt self propelled w/c throughout unit at mod I level. Extensive time spent discussing/educationg regarding d/c planning, managing pt's anxiety, energy conservation and DME. Pt requiring increased time for processing and VCs for deep breathing techniques due to increasing anxiety. In ADL apartment, attempted to complete simulated tub/shower transfer utilizing tub bench. Despite use of bariatric bench, bench tilted when pt sat and startled pt, causing him to return to standing and declining completing transfer to entirety.  Pt returned to w/c and we spent extensive time speaking through and therapist demonstrating technique for transfer. Pt willing to attempt simulated transfer again during PM session.  Pt returned to room at end of session, mod I stand pivot transfer to recliner. Pt left with all  needs in reach.   Therapy Documentation Precautions:  Precautions Precautions: Fall Restrictions Weight Bearing Restrictions: Yes LLE Weight Bearing: Non weight bearing Pain:   No/denies pain   Therapy/Group: Individual Therapy  Adalin Vanderploeg L 09/25/2018, 6:52 AM

## 2018-09-26 ENCOUNTER — Emergency Department (HOSPITAL_COMMUNITY)
Admission: EM | Admit: 2018-09-26 | Discharge: 2018-09-26 | Disposition: A | Discharge status: Home / Self Care | Payer: Medicaid Other | Attending: Emergency Medicine | Admitting: Emergency Medicine

## 2018-09-26 ENCOUNTER — Ambulatory Visit: Payer: Medicaid Other | Admitting: Sports Medicine

## 2018-09-26 ENCOUNTER — Emergency Department (HOSPITAL_COMMUNITY): Payer: Medicaid Other

## 2018-09-26 DIAGNOSIS — E119 Type 2 diabetes mellitus without complications: Secondary | ICD-10-CM | POA: Insufficient documentation

## 2018-09-26 DIAGNOSIS — Z89512 Acquired absence of left leg below knee: Secondary | ICD-10-CM | POA: Insufficient documentation

## 2018-09-26 DIAGNOSIS — S88119A Complete traumatic amputation at level between knee and ankle, unspecified lower leg, initial encounter: Secondary | ICD-10-CM

## 2018-09-26 DIAGNOSIS — W19XXXA Unspecified fall, initial encounter: Secondary | ICD-10-CM

## 2018-09-26 DIAGNOSIS — I10 Essential (primary) hypertension: Secondary | ICD-10-CM | POA: Insufficient documentation

## 2018-09-26 DIAGNOSIS — F4001 Agoraphobia with panic disorder: Secondary | ICD-10-CM

## 2018-09-26 DIAGNOSIS — Z79899 Other long term (current) drug therapy: Secondary | ICD-10-CM | POA: Insufficient documentation

## 2018-09-26 DIAGNOSIS — S88111S Complete traumatic amputation at level between knee and ankle, right lower leg, sequela: Secondary | ICD-10-CM

## 2018-09-26 DIAGNOSIS — Z794 Long term (current) use of insulin: Secondary | ICD-10-CM | POA: Insufficient documentation

## 2018-09-26 LAB — GLUCOSE, CAPILLARY
GLUCOSE-CAPILLARY: 177 mg/dL — AB (ref 70–99)
Glucose-Capillary: 162 mg/dL — ABNORMAL HIGH (ref 70–99)

## 2018-09-26 MED ORDER — GABAPENTIN 300 MG PO CAPS: 300.0000 mg | ORAL_CAPSULE | Freq: Three times a day (TID) | ORAL | 0 refills | Status: DC

## 2018-09-26 MED ORDER — SACCHAROMYCES BOULARDII 250 MG PO CAPS: 250.0000 mg | ORAL_CAPSULE | Freq: Two times a day (BID) | ORAL | 0 refills | Status: DC

## 2018-09-26 MED ORDER — NUCYNTA 100 MG PO TABS: 100.0000 mg | ORAL_TABLET | Freq: Four times a day (QID) | ORAL | 0 refills | Status: DC

## 2018-09-26 MED ORDER — OXYCODONE HCL 5 MG PO TABS: 5.0000 mg | ORAL_TABLET | ORAL | 0 refills | Status: DC | PRN

## 2018-09-26 MED ORDER — CITALOPRAM HYDROBROMIDE 20 MG PO TABS: 20.0000 mg | ORAL_TABLET | Freq: Every day | ORAL | 2 refills | Status: DC

## 2018-09-26 MED ORDER — CYCLOBENZAPRINE HCL 5 MG PO TABS: 5.0000 mg | ORAL_TABLET | Freq: Three times a day (TID) | ORAL | 0 refills | Status: AC | PRN

## 2018-09-26 MED ORDER — CITALOPRAM HYDROBROMIDE 10 MG PO TABS: 30.0000 mg | ORAL_TABLET | Freq: Every day | ORAL | 0 refills | Status: DC

## 2018-09-26 MED ORDER — TRAZODONE HCL 50 MG PO TABS: 25.0000 mg | ORAL_TABLET | Freq: Every evening | ORAL | 0 refills | Status: AC | PRN

## 2018-09-26 MED ORDER — HYDROCERIN EX CREA: 1.0000 "application " | TOPICAL_CREAM | Freq: Two times a day (BID) | CUTANEOUS | 0 refills | Status: DC

## 2018-09-26 MED ORDER — METOPROLOL TARTRATE 75 MG PO TABS: 75.0000 mg | ORAL_TABLET | Freq: Two times a day (BID) | ORAL | 0 refills | Status: DC

## 2018-09-26 MED ORDER — VICTOZA 18 MG/3ML ~~LOC~~ SOPN: 1.8000 mg | PEN_INJECTOR | Freq: Every day | SUBCUTANEOUS | 5 refills | Status: DC

## 2018-09-26 MED ORDER — INSULIN DETEMIR 100 UNIT/ML ~~LOC~~ SOLN: 70.0000 [IU] | Freq: Two times a day (BID) | SUBCUTANEOUS | 11 refills | Status: DC

## 2018-09-26 MED ORDER — ACETAMINOPHEN 325 MG PO TABS: 325.0000 mg | ORAL_TABLET | ORAL | Status: DC | PRN

## 2018-09-26 MED ORDER — AMLODIPINE BESYLATE 5 MG PO TABS: 5.0000 mg | ORAL_TABLET | Freq: Every day | ORAL | 0 refills | Status: DC

## 2018-09-26 MED ORDER — OXYCODONE-ACETAMINOPHEN 5-325 MG PO TABS
1.0000 | ORAL_TABLET | Freq: Once | ORAL | Status: AC
  Administered 2018-09-26: 1 via ORAL
  Filled 2018-09-26: qty 1

## 2018-09-26 MED ORDER — HYDROXYZINE HCL 10 MG PO TABS: 10.0000 mg | ORAL_TABLET | Freq: Three times a day (TID) | ORAL | 0 refills | Status: DC

## 2018-09-26 MED ORDER — GEMFIBROZIL 600 MG PO TABS: 600.0000 mg | ORAL_TABLET | Freq: Two times a day (BID) | ORAL | Status: AC

## 2018-09-26 MED ORDER — HYDRALAZINE HCL 25 MG PO TABS: 25.0000 mg | ORAL_TABLET | Freq: Three times a day (TID) | ORAL | 0 refills | Status: DC

## 2018-09-26 MED ORDER — ASPIRIN EC 81 MG PO TBEC: 81.0000 mg | DELAYED_RELEASE_TABLET | Freq: Every day | ORAL | 0 refills | Status: AC

## 2018-09-26 MED ORDER — ESOMEPRAZOLE MAGNESIUM 40 MG PO CPDR: 40.0000 mg | DELAYED_RELEASE_CAPSULE | ORAL | 0 refills | Status: DC

## 2018-09-26 NOTE — ED Notes (Signed)
ED Provider at bedside. 

## 2018-09-26 NOTE — Discharge Instructions (Addendum)
Please continue your home medications as previously prescribed. Return to the emergency room for worsening pain, swelling, fever or pus draining from the area.
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
## 2018-09-26 NOTE — ED Notes (Signed)
Patient verbalizes understanding of discharge instructions. Opportunity for questioning and answers were provided. Armband removed by staff, pt discharged from ED. Pt wheeled to lobby. 

## 2018-09-26 NOTE — ED Notes (Signed)
Patient transported to X-ray 

## 2018-09-26 NOTE — ED Notes (Signed)
RN attempted to raise side rails to prevent injury to patient. Pt refused stating he is extremely claustrophobic. RN pointed out pt had fall that brought him to ER. Pt agrees he is assuming responsibility by refusing to left this RN raise side rail.

## 2018-09-26 NOTE — Telephone Encounter (Signed)
I had called and advised Kristopher Simon of pt this morning and she saw pt on rounds.

## 2018-09-26 NOTE — Discharge Instructions (Signed)
Inpatient Rehab Discharge Instructions  Kristopher Simon Discharge date and time:  09/26/18  Activities/Precautions/ Functional Status: Activity: no lifting, driving, or strenuous exercise  till cleared by MD Diet: diabetic diet Wound Care: Cleanse incision with soap and water, pat dry and apply stump shrinker. Contact Dr. Sharol Given if you develop any problems with your incision/wound--redness, swelling, increase in pain, drainage or if you develop fever or chills.      Functional status:  ___ No restrictions     ___ Walk up steps independently _X__ 24/7 supervision/assistance   ___ Walk up steps with assistance ___ Intermittent supervision/assistance  ___ Bathe/dress independently ___ Walk with walker     _X__ Bathe/dress with assistance ___ Walk Independently    ___ Shower independently ___ Walk with assistance    ___ Shower with assistance _X__ No alcohol     ___ Return to work/school ________   Special Instructions: 1. Monitor blood sugars before meals and at bedtime. Note that your insulin has been adjusted. Goal of blood sugars is 80-100 range to help promote healing. 2. Keep compression on the residual limb as it will need to "cone" down.    COMMUNITY REFERRALS UPON DISCHARGE:    Home Health:   PT, OT, RN  Agency:[facility]   Date of last service:09/26/2018  Medical Equipment/Items [place] ON ORDER WAS PRIOR TO ADMISSION INFORMATION [place] UP- FOR PANIC ATTACKS  GENERAL COMMUNITY RESOURCES FOR PATIENT/FAMILY: Support Groups:AMPUTATION SUPPORT GROUP SECOND Thursday @ 7:00-8:30 PM AT [place] CONTACT ROBIN [phone]  My questions have been answered and I understand these instructions. I will adhere to these goals and the provided educational materials after my  discharge from the hospital.  Patient/Caregiver Signature _______________________________ Date __________  Clinician Signature _______________________________________ Date __________  Please bring this form and your medication list with you to all your follow-up doctor's appointments.

## 2018-09-26 NOTE — Progress Notes (Signed)
Pt DC summary complete by Algis Liming, PA. Belongings packed and sister taking pt home. No concerns questions noted. Pt transported to main lobby for DC.  Erie Noe, LPN

## 2018-09-26 NOTE — ED Notes (Signed)
Incision site re-wrapped.

## 2018-09-26 NOTE — Plan of Care (Signed)
  Problem: Consults Goal: RH LIMB LOSS PATIENT EDUCATION Description Description: See Patient Education module for eduction specifics. Outcome: Completed/Met   Problem: RH BOWEL ELIMINATION Goal: RH STG MANAGE BOWEL WITH ASSISTANCE Description STG Manage Bowel independently.  Outcome: Completed/Met Goal: RH STG MANAGE BOWEL W/MEDICATION W/ASSISTANCE Description STG Manage Bowel with Medication with Mod I Assistance.  Outcome: Completed/Met   Problem: RH PAIN MANAGEMENT Goal: RH STG PAIN MANAGED AT OR BELOW PT'S PAIN GOAL Description Patient expresses pain level at or below level 3.  Outcome: Completed/Met   Problem: RH KNOWLEDGE DEFICIT LIMB LOSS Goal: RH STG INCREASE KNOWLEDGE OF SELF CARE AFTER LIMB LOSS Description Patient will demonstrate self care independently using handouts and resources provided.  Outcome: Completed/Met

## 2018-09-26 NOTE — Telephone Encounter (Signed)
This has been done.

## 2018-09-26 NOTE — Progress Notes (Signed)
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   Objective: Vital signs in last 24 hours: Temp:  [97.5 F (36.4 C)-98.1 F (36.7 C)] 97.8 F (36.6 C) (02/05 0554) Pulse Rate:  [75-84] 77 (02/05 0554) Resp:  [16-18] 16 (02/05 0554) BP: (131-153)/(58-70) 153/70 (02/05 0554) SpO2:  [95 %-99 %] 95 % (02/05 0554)  Intake/Output from previous day: 02/04 0701 - 02/05 0700 In: 666 [P.O.:666] Out: 925 [Urine:925] Intake/Output this shift: Total I/O In: 240 [P.O.:240] Out: 500 [Urine:500]  Recent Labs    09/24/18 0934  HGB 10.8*   Recent Labs    09/24/18 0934  WBC 10.8*  RBC 4.31  HCT 37.2*  PLT 439*   Recent Labs    09/24/18 0934  NA 138  K 4.9  CL 102  CO2 27  BUN 13  CREATININE 1.28*  GLUCOSE 170*  CALCIUM 8.9   No results for input(s): LABPT, INR in the last 72 hours.  Left transtibial amputation site with scant serosanguinous drainage, edema as expected.No signs of cellulitis or infection. Full knee extension and good flexion.   Using 3XL shrinker stocking and has a couple of these.    Assessment/Plan:    POD # 12 following Left transtibial amputation- Doing well following VAC removal.  Would continue stump shrinker at all times, may turn down and fold down over knee as pressure over the medial thigh from the shrinker noted.  Follow up in the office next week.     Erlinda Hong, PA-C 09/26/2018, 9:23 AM  [employer] [phone]

## 2018-09-26 NOTE — ED Triage Notes (Signed)
Pt just released from hospital post R BKA. Pt states he fell landing on his stump just PTA causing it to "bust open" Bleeding controlled with ace wrap.

## 2018-09-28 ENCOUNTER — Telehealth: Payer: Self-pay

## 2018-09-28 ENCOUNTER — Telehealth (INDEPENDENT_AMBULATORY_CARE_PROVIDER_SITE_OTHER): Payer: Self-pay | Admitting: Orthopedic Surgery

## 2018-09-28 NOTE — Telephone Encounter (Signed)
Transitional Care call  Patient name: Kristopher Simon) DOB: (04/08/1966) 1. Are you/is patient experiencing any problems since coming home? (YES, STATES HAS NO SUPPORT GETTING OFF AND ON TOILET, NO HELP BEING ABLE TO AMBULATE OR TRANSPORT SELF IN HOUSE, STATES HOUSE IS SMALL, ABLE TO GET TO ROOMS BUT NOT EASY TO GET THROUGH DOORS AND NOT VERY WHEELCHAIR SUPPORTAVE) a. Are there any questions regarding any aspect of care? (YES, PATIENT DESIRES TO BE TRANSFERRED FROM HOME TO A [place]) 2. Are there any questions regarding medications administration/dosing? (YES, WAS UNSURE WHY CERTAIN MEDICATION WAS NOT ON HIS LIST TO CONTINUE TO TAKE.  NOTED THAT IN HIS DISCHARGE THAT THE MEDICATION HE DID NOT RECIVE WAS IN THE "STOP TAKING" LIST) a. Are meds being taken as prescribed? (YES) b. "Patient should review meds with caller to confirm"  3. Have there been any falls? (YES, SEE ER NOTE 09-26-2018) 4. Has Home Health been to the house and/or have they contacted you? (YES) a. If not, have you tried to contact them? (NA) b. Can we help you contact them? (NA) 5. Are bowels and bladder emptying properly? (YES) a. Are there any unexpected incontinence issues? (NA) b. If applicable, is patient following bowel/bladder programs? (NA) 6. Any fevers, problems with breathing, unexpected pain? (NO) 7. Are there any skin problems or new areas of breakdown? (NO) 8. Has the patient/family member arranged specialty MD follow up (ie cardiology/neurology/renal/surgical/etc.)?  (YES) a. Can we help arrange? (NO) 9. Does the patient need any other services or support that we can help arrange? (YES) 10. Are caregivers following through as expected in assisting the patient? (YES) 11. Has the patient quit smoking, drinking alcohol, or using drugs as recommended? (NA)  Appointment date/time (10-08-2018 / 10:40AM), arrive time (10:20AM) and who it is with here Zella Ball first then Dr. Posey Pronto) [name]
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
## 2018-09-28 NOTE — Telephone Encounter (Signed)
Levada Dy with [facility] left a message requesting VO for [facility] PT frequency and Wound Care.  She states that there is moderate red drainage.  CB#[phone].

## 2018-10-01 NOTE — Telephone Encounter (Signed)
I called pt and made an appt for him to come in on Wednesday for post op follow up. Called and lm on vm for [facility] PT to advise verbal ok for physical therapy and to advise dry dressing change daily and shrinker will eval at office visit and call back with updated orders. To call with questions.

## 2018-10-02 ENCOUNTER — Telehealth (INDEPENDENT_AMBULATORY_CARE_PROVIDER_SITE_OTHER): Payer: Self-pay | Admitting: Orthopedic Surgery

## 2018-10-02 NOTE — Telephone Encounter (Signed)
Called and sw HHN angela while giving nursing orders ok for pt to have OT as requested below as well.

## 2018-10-02 NOTE — Telephone Encounter (Signed)
[place]  [phone]     Requesting one additional OT visit to set up patient bathroom

## 2018-10-02 NOTE — Telephone Encounter (Signed)
I called and gave verbal ok for [facility] will call tomorrow with update after pt's appt.

## 2018-10-02 NOTE — Telephone Encounter (Signed)
Angela with [facility] called to request VO for the following:  4x a week for 1 week.  CB#[phone].  Thank you.

## 2018-10-03 ENCOUNTER — Ambulatory Visit (INDEPENDENT_AMBULATORY_CARE_PROVIDER_SITE_OTHER): Payer: Medicaid Other | Admitting: Family

## 2018-10-03 ENCOUNTER — Encounter (INDEPENDENT_AMBULATORY_CARE_PROVIDER_SITE_OTHER): Payer: Self-pay | Admitting: Family

## 2018-10-03 VITALS — Ht 77.0 in | Wt 381.0 lb

## 2018-10-03 DIAGNOSIS — S88111A Complete traumatic amputation at level between knee and ankle, right lower leg, initial encounter: Secondary | ICD-10-CM
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
## 2018-10-08 ENCOUNTER — Encounter: Payer: Medicaid Other | Attending: Registered Nurse | Admitting: Registered Nurse

## 2018-10-08 ENCOUNTER — Encounter: Payer: Self-pay | Admitting: Registered Nurse

## 2018-10-08 ENCOUNTER — Other Ambulatory Visit: Payer: Self-pay

## 2018-10-08 VITALS — BP 148/78 | HR 75

## 2018-10-08 DIAGNOSIS — Z89512 Acquired absence of left leg below knee: Secondary | ICD-10-CM

## 2018-10-08 DIAGNOSIS — E669 Obesity, unspecified: Secondary | ICD-10-CM

## 2018-10-08 DIAGNOSIS — I10 Essential (primary) hypertension: Secondary | ICD-10-CM | POA: Insufficient documentation

## 2018-10-08 DIAGNOSIS — Z8249 Family history of ischemic heart disease and other diseases of the circulatory system: Secondary | ICD-10-CM | POA: Diagnosis not present

## 2018-10-08 DIAGNOSIS — E1169 Type 2 diabetes mellitus with other specified complication: Secondary | ICD-10-CM | POA: Diagnosis not present

## 2018-10-08 DIAGNOSIS — E1142 Type 2 diabetes mellitus with diabetic polyneuropathy: Secondary | ICD-10-CM | POA: Diagnosis not present

## 2018-10-08 DIAGNOSIS — G8918 Other acute postprocedural pain: Secondary | ICD-10-CM | POA: Diagnosis not present

## 2018-10-08 DIAGNOSIS — I2510 Atherosclerotic heart disease of native coronary artery without angina pectoris: Secondary | ICD-10-CM | POA: Diagnosis not present

## 2018-10-08 NOTE — Progress Notes (Signed)
Subjective:    Patient ID: Kristopher Simon, male    DOB: 1966-08-05, 53 y.o.   MRN: [id number]  HPI: Kristopher Simon is a 53 y.o. male who is here for Transitional care visit. He was admitted to [facility] on 09/12/2018, he underwent extensive work up, CT Scan of left lower extremity was ordered. Concerns of septic arthritis, chronic osteomyelitis, Dr. Sharol Given was consulted. On 09/14/2018 he underwent  Left BKA by Dr. Sharol Given.  He was admitted to [facility] on 09/17/2018 and discharged on 09/26/2018, discharged home. He's receiving outpatient therapy with [facility]. He reports left stump pain. He rates his pain 6. He reports good appetite.   On 09/26/2018 [name] was seen at [facility] ED after a mechanical fall, note was reviewed.    Mr. Koval Morphine equivalent is 160.00 MME.    Pain Inventory Average Pain 7 Pain Right Now 6 My pain is constant, sharp, burning and aching  In the last 24 hours, has pain interfered with the following? General activity 7 Relation with others 7 Enjoyment of life 7 What TIME of day is your pain at its worst? evening Sleep (in general) Poor  Pain is worse with: sitting and inactivity Pain improves with: pacing activities and medication Relief from Meds: 5  Mobility use a walker ability to climb steps?  yes use a wheelchair  Function disabled: date disabled 09/2017  Neuro/Psych trouble walking spasms depression anxiety  Prior Studies Any changes since last visit?  yes x-rays  [place] at home and received from ED  Physicians involved in your care [place]   Family History  Problem Relation Age of Onset  . Hypertension Mother   . Dementia Father   . Alzheimer's disease Father    Social History   Socioeconomic History  . Marital status: Divorced    Spouse name: Not on file  . Number of children: Not on file  . Years of education: Not on file  . Highest education level: Not on file  Occupational  History  . Not on file  Social Needs  . Financial resource strain: Not on file  . Food insecurity:    Worry: Not on file    Inability: Not on file  . Transportation needs:    Medical: Not on file    Non-medical: Not on file  Tobacco Use  . Smoking status: Never Smoker  . Smokeless tobacco: Never Used  Substance and Sexual Activity  . Alcohol use: Yes    Alcohol/week: 1.0 standard drinks    Types: 1 Glasses of wine per week    Comment: rarely  . Drug use: No  . Sexual activity: Yes    Birth control/protection: None  Lifestyle  . Physical activity:    Days per week: Not on file    Minutes per session: Not on file  . Stress: Not on file  Relationships  . Social connections:    Talks on phone: Not on file    Gets together: Not on file    Attends religious service: Not on file    Active member of club or organization: Not on file    Attends meetings of clubs or organizations: Not on file    Relationship status: Not on file  Other Topics Concern  . Not on file  Social History Narrative  . Not on file   Past Surgical History:  Procedure Laterality Date  . AMPUTATION Left 09/14/2018   Procedure: LEFT BELOW KNEE AMPUTATION;  Surgeon: Newt Minion, MD;  Location: [facility];  Service: Orthopedics;  Laterality: Left;  . CARDIAC CATHETERIZATION    . FRACTURE SURGERY    . HIP FRACTURE SURGERY     Past Medical History:  Diagnosis Date  . Acute renal failure ([place]) 01/2012  . Anxiety   . Cellulitis   . Cellulitis of left lower extremity 07/06/2014  . Concussion   . Coronary artery disease   . Diabetes mellitus 1995  . Fibromyalgia   . Foot abscess, left 09/12/2018  . Hypertension 1998   BP (!) 148/78   Pulse 75   SpO2 98%   Opioid Risk Score:   Fall Risk Score:  `1  Depression screen PHQ 2/9  Depression screen PHQ 2/9 10/08/2018  Decreased Interest 2  Down, Depressed, Hopeless 2  PHQ - 2 Score 4  Altered sleeping 2  Tired, decreased energy 2  Change in appetite 0    Feeling bad or failure about yourself  1  Trouble concentrating 2  Moving slowly or fidgety/restless 1  Suicidal thoughts 0  PHQ-9 Score 12  Difficult doing work/chores Somewhat difficult    Review of Systems  Constitutional: Negative.   HENT: Negative.   Eyes: Negative.   Respiratory: Negative.   Cardiovascular: Negative.   Gastrointestinal: Negative.   Endocrine: Negative.   Genitourinary: Negative.   Musculoskeletal: Positive for gait problem.       Spasms   Skin: Positive for wound.       Says he has drainage from stump (saw Dr Jess Barters PA last Wednesday)  Allergic/Immunologic: Negative.   Neurological:       Phantom limb pain is the worst problem  Hematological: Bruises/bleeds easily.       On plavix  Psychiatric/Behavioral: Positive for dysphoric mood. The patient is nervous/anxious.   All other systems reviewed and are negative.      Objective:   Physical Exam Vitals signs and nursing note reviewed.  Constitutional:      Appearance: Normal appearance.  Neck:     Musculoskeletal: Normal range of motion and neck supple.  Cardiovascular:     Rate and Rhythm: Normal rate and regular rhythm.     Pulses: Normal pulses.     Heart sounds: Normal heart sounds.  Pulmonary:     Effort: Pulmonary effort is normal.     Breath sounds: Normal breath sounds.  Musculoskeletal:     Comments: Normal Muscle Bulk and Muscle Testing Reveals:  Upper Extremities: Full ROM and Muscle Strength 5/5  Lower Extremities: Right: Full ROM and Muscle Strength 5/5 Left BKA:  Arrived in wheelchair  Skin:    General: Skin is warm and dry.     Comments: Left BKA: Area Cleansed: Staples Intact, dressing applied.  Neurological:     Mental Status: He is alert and oriented to person, place, and time.  Psychiatric:        Mood and Affect: Mood normal.        Behavior: Behavior normal.           Assessment & Plan:  1. S/P Left BKA: Dr. Sharol Given Following. Continue to Monitor. Continue Home  Health Therapy.  2. Type 2 DM/ Peripheral Neuropathy:Continue Gabapentin. Continue to Monitor. PCP Following.  3.Essential Hypertension: Continue current medication regimen: PCP Following. Continue to Monitor.  4. Post-operative Pain: Continue Nucynta 100 mg one tablet evey 6 hours as needed for pain  30  minutes of face to face patient care time was spent during this  visit. All questions were encouraged and answered.  F/U in 4-6 weeks with Dr Posey Pronto

## 2018-10-11 ENCOUNTER — Telehealth: Payer: Self-pay

## 2018-10-11 ENCOUNTER — Other Ambulatory Visit (INDEPENDENT_AMBULATORY_CARE_PROVIDER_SITE_OTHER): Payer: Self-pay

## 2018-10-11 ENCOUNTER — Telehealth (INDEPENDENT_AMBULATORY_CARE_PROVIDER_SITE_OTHER): Payer: Self-pay

## 2018-10-11 MED ORDER — DOXYCYCLINE HYCLATE 100 MG PO TABS: 100.0000 mg | ORAL_TABLET | Freq: Two times a day (BID) | ORAL | 0 refills | Status: DC

## 2018-10-11 NOTE — Telephone Encounter (Signed)
Santiago Glad with [facility] called stating that patient was running a fever of 100.1.  Surgical incision has increased swelling, redness, and heat at the inner part of the surgical wound.  Would like to know if Dr. Sharol Given will prescribe an Antibiotic? CB# [phone].  Please advise.  Thank you.

## 2018-10-11 NOTE — Telephone Encounter (Signed)
Santiago Glad RN [facility] called and informed that patients leg is still very red, hot to the touch, bloody discharge, and red and scabbed over.  Stated he is also running an increased temp of 100.63F.  Spoke with Dr. Posey Pronto about information that is given to me, states he either needs to be seen by Dr. Sharol Given, urgent care or ER but patient very much needs to be seen and placed on antibiotics.  Called her back and relayed information.

## 2018-10-11 NOTE — Telephone Encounter (Signed)
Ok per Dr. Sharol Given to fax in rx for doxy bid. Pt will also come into the office for follow up tomorrow at 2:45. Pt voiced understanding will start ABX tonight

## 2018-10-12 ENCOUNTER — Ambulatory Visit (INDEPENDENT_AMBULATORY_CARE_PROVIDER_SITE_OTHER): Payer: Medicaid Other | Admitting: Physician Assistant

## 2018-10-12 ENCOUNTER — Encounter (INDEPENDENT_AMBULATORY_CARE_PROVIDER_SITE_OTHER): Payer: Self-pay | Admitting: Physician Assistant

## 2018-10-12 VITALS — Ht 77.0 in | Wt 381.0 lb

## 2018-10-12 DIAGNOSIS — Z794 Long term (current) use of insulin: Secondary | ICD-10-CM

## 2018-10-12 DIAGNOSIS — E1142 Type 2 diabetes mellitus with diabetic polyneuropathy: Secondary | ICD-10-CM

## 2018-10-12 DIAGNOSIS — Z89512 Acquired absence of left leg below knee: Secondary | ICD-10-CM
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
## 2018-10-14 ENCOUNTER — Encounter (INDEPENDENT_AMBULATORY_CARE_PROVIDER_SITE_OTHER): Payer: Self-pay | Admitting: Physician Assistant

## 2018-10-17 ENCOUNTER — Encounter (INDEPENDENT_AMBULATORY_CARE_PROVIDER_SITE_OTHER): Payer: Self-pay | Admitting: Family

## 2018-10-17 ENCOUNTER — Ambulatory Visit (INDEPENDENT_AMBULATORY_CARE_PROVIDER_SITE_OTHER): Payer: Medicaid Other | Admitting: Family

## 2018-10-17 VITALS — Ht 77.0 in | Wt 381.0 lb

## 2018-10-17 DIAGNOSIS — Z89512 Acquired absence of left leg below knee: Secondary | ICD-10-CM
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
## 2018-10-18 ENCOUNTER — Ambulatory Visit (INDEPENDENT_AMBULATORY_CARE_PROVIDER_SITE_OTHER): Payer: Self-pay | Admitting: Physician Assistant

## 2018-10-18 ENCOUNTER — Other Ambulatory Visit: Payer: Self-pay

## 2018-10-18 ENCOUNTER — Encounter (HOSPITAL_COMMUNITY): Payer: Self-pay | Admitting: *Deleted

## 2018-10-18 MED ORDER — DEXTROSE 5 % IV SOLN
3.0000 g | INTRAVENOUS | Status: AC
  Administered 2018-10-19: 3 g via INTRAVENOUS
  Filled 2018-10-18: qty 3

## 2018-10-18 NOTE — Progress Notes (Signed)
Kristopher Simon denies chest pain or shortness of breath.  Patient has Type II diabetes, he reported that CBG was 220 this am.Kristopher Simon saw Melton Alar, PA-C on 10/15/2018- a point of care A1C was done - it was 7.8, down from 10.1 in January. I instructed patient to take 38 units of Tresiba tonight.  If CBG is greater than 70, take 38 units of Tresiba in am. Do not take  Victozia in am. If CBG > 220 take 1/2 of SS Insulin. I instructed patient to check CBG after awaking and every 2 hours until arrival  to the hospital.  I Instructed patient if CBG is less than 70 to take 4 Glucose Tablets  Recheck CBG in 15 minutes then call pre- op desk at [phone] for further instructions. If scheduled to receive Insulin, do not take Insulin. PCP is Sarah Martinique, cardiologist is Dr. Mathis Bud, Neurologist is Dr. Marisue Humble.

## 2018-10-19 ENCOUNTER — Inpatient Hospital Stay (HOSPITAL_COMMUNITY)
Admission: RE | Admit: 2018-10-19 | Discharge: 2018-10-22 | DRG: 476 | Disposition: A | Discharge status: Home / Self Care | Payer: Medicaid Other | Attending: Orthopedic Surgery | Admitting: Orthopedic Surgery

## 2018-10-19 ENCOUNTER — Inpatient Hospital Stay (HOSPITAL_COMMUNITY): Payer: Medicaid Other | Admitting: Certified Registered"

## 2018-10-19 ENCOUNTER — Encounter (HOSPITAL_COMMUNITY)
Admission: RE | Disposition: A | Discharge status: Home / Self Care | Payer: Self-pay | Source: Home / Self Care | Attending: Orthopedic Surgery

## 2018-10-19 ENCOUNTER — Encounter (HOSPITAL_COMMUNITY): Payer: Self-pay

## 2018-10-19 DIAGNOSIS — T8781 Dehiscence of amputation stump: Secondary | ICD-10-CM | POA: Diagnosis not present

## 2018-10-19 DIAGNOSIS — M797 Fibromyalgia: Secondary | ICD-10-CM | POA: Diagnosis present

## 2018-10-19 DIAGNOSIS — Z7982 Long term (current) use of aspirin: Secondary | ICD-10-CM

## 2018-10-19 DIAGNOSIS — Z882 Allergy status to sulfonamides status: Secondary | ICD-10-CM

## 2018-10-19 DIAGNOSIS — Z79891 Long term (current) use of opiate analgesic: Secondary | ICD-10-CM

## 2018-10-19 DIAGNOSIS — Z86718 Personal history of other venous thrombosis and embolism: Secondary | ICD-10-CM

## 2018-10-19 DIAGNOSIS — Z7902 Long term (current) use of antithrombotics/antiplatelets: Secondary | ICD-10-CM

## 2018-10-19 DIAGNOSIS — F4310 Post-traumatic stress disorder, unspecified: Secondary | ICD-10-CM | POA: Diagnosis present

## 2018-10-19 DIAGNOSIS — Z820 Family history of epilepsy and other diseases of the nervous system: Secondary | ICD-10-CM

## 2018-10-19 DIAGNOSIS — E1122 Type 2 diabetes mellitus with diabetic chronic kidney disease: Secondary | ICD-10-CM | POA: Diagnosis present

## 2018-10-19 DIAGNOSIS — Z794 Long term (current) use of insulin: Secondary | ICD-10-CM

## 2018-10-19 DIAGNOSIS — Z8249 Family history of ischemic heart disease and other diseases of the circulatory system: Secondary | ICD-10-CM

## 2018-10-19 DIAGNOSIS — K219 Gastro-esophageal reflux disease without esophagitis: Secondary | ICD-10-CM | POA: Diagnosis present

## 2018-10-19 DIAGNOSIS — I2510 Atherosclerotic heart disease of native coronary artery without angina pectoris: Secondary | ICD-10-CM | POA: Diagnosis present

## 2018-10-19 DIAGNOSIS — Z888 Allergy status to other drugs, medicaments and biological substances status: Secondary | ICD-10-CM

## 2018-10-19 DIAGNOSIS — E11610 Type 2 diabetes mellitus with diabetic neuropathic arthropathy: Secondary | ICD-10-CM | POA: Diagnosis present

## 2018-10-19 DIAGNOSIS — T8754 Necrosis of amputation stump, left lower extremity: Principal | ICD-10-CM | POA: Diagnosis present

## 2018-10-19 DIAGNOSIS — Z89512 Acquired absence of left leg below knee: Secondary | ICD-10-CM

## 2018-10-19 DIAGNOSIS — I69311 Memory deficit following cerebral infarction: Secondary | ICD-10-CM

## 2018-10-19 DIAGNOSIS — N189 Chronic kidney disease, unspecified: Secondary | ICD-10-CM | POA: Diagnosis present

## 2018-10-19 DIAGNOSIS — M1990 Unspecified osteoarthritis, unspecified site: Secondary | ICD-10-CM | POA: Diagnosis present

## 2018-10-19 DIAGNOSIS — I129 Hypertensive chronic kidney disease with stage 1 through stage 4 chronic kidney disease, or unspecified chronic kidney disease: Secondary | ICD-10-CM | POA: Diagnosis present

## 2018-10-19 HISTORY — DX: Major depressive disorder, single episode, unspecified: F32.9

## 2018-10-19 HISTORY — DX: Cerebral infarction, unspecified: I63.9

## 2018-10-19 HISTORY — DX: Acute embolism and thrombosis of unspecified deep veins of unspecified lower extremity: I82.409

## 2018-10-19 HISTORY — DX: Polyneuropathy, unspecified: G62.9

## 2018-10-19 HISTORY — DX: Gastro-esophageal reflux disease without esophagitis: K21.9

## 2018-10-19 HISTORY — DX: Post-traumatic stress disorder, unspecified: F43.10

## 2018-10-19 HISTORY — PX: STUMP REVISION: SHX6102

## 2018-10-19 HISTORY — DX: Unspecified osteoarthritis, unspecified site: M19.90

## 2018-10-19 HISTORY — DX: Depression, unspecified: F32.A

## 2018-10-19 LAB — CBC
HCT: 39.9 % (ref 39.0–52.0)
Hemoglobin: 12 g/dL — ABNORMAL LOW (ref 13.0–17.0)
MCH: 26.4 pg (ref 26.0–34.0)
MCHC: 30.1 g/dL (ref 30.0–36.0)
MCV: 87.9 fL (ref 80.0–100.0)
Platelets: 429 K/uL — ABNORMAL HIGH (ref 150–400)
RBC: 4.54 MIL/uL (ref 4.22–5.81)
RDW: 17 % — ABNORMAL HIGH (ref 11.5–15.5)
WBC: 11.9 K/uL — ABNORMAL HIGH (ref 4.0–10.5)
nRBC: 0 % (ref 0.0–0.2)

## 2018-10-19 LAB — GLUCOSE, CAPILLARY
Glucose-Capillary: 219 mg/dL — ABNORMAL HIGH (ref 70–99)
Glucose-Capillary: 186 mg/dL — ABNORMAL HIGH (ref 70–99)
Glucose-Capillary: 167 mg/dL — ABNORMAL HIGH (ref 70–99)
Glucose-Capillary: 219 mg/dL — ABNORMAL HIGH (ref 70–99)
Glucose-Capillary: 252 mg/dL — ABNORMAL HIGH (ref 70–99)

## 2018-10-19 LAB — BASIC METABOLIC PANEL WITH GFR
Anion gap: 10 (ref 5–15)
BUN: 23 mg/dL — ABNORMAL HIGH (ref 6–20)
CO2: 23 mmol/L (ref 22–32)
Calcium: 9.1 mg/dL (ref 8.9–10.3)
Chloride: 103 mmol/L (ref 98–111)
Creatinine, Ser: 1.16 mg/dL (ref 0.61–1.24)
GFR calc Af Amer: >60 mL/min
GFR calc non Af Amer: >60 mL/min
Glucose, Bld: 256 mg/dL — ABNORMAL HIGH (ref 70–99)
Potassium: 4.7 mmol/L (ref 3.5–5.1)
Sodium: 136 mmol/L (ref 135–145)

## 2018-10-19 SURGERY — REVISION, AMPUTATION SITE
Anesthesia: General | Laterality: Left

## 2018-10-19 MED ORDER — KETOROLAC TROMETHAMINE 30 MG/ML IJ SOLN
INTRAMUSCULAR | Status: AC
  Filled 2018-10-19: qty 1

## 2018-10-19 MED ORDER — HYDROMORPHONE HCL 1 MG/ML IJ SOLN
0.5000 mg | INTRAMUSCULAR | Status: DC | PRN
  Administered 2018-10-20 – 2018-10-21 (×2): 1 mg via INTRAVENOUS
  Filled 2018-10-19 (×2): qty 1

## 2018-10-19 MED ORDER — GABAPENTIN 300 MG PO CAPS
300.0000 mg | ORAL_CAPSULE | Freq: Three times a day (TID) | ORAL | Status: DC
  Administered 2018-10-19 – 2018-10-21 (×8): 300 mg via ORAL
  Filled 2018-10-19 (×9): qty 1

## 2018-10-19 MED ORDER — PHENYLEPHRINE 40 MCG/ML (10ML) SYRINGE FOR IV PUSH (FOR BLOOD PRESSURE SUPPORT)
PREFILLED_SYRINGE | INTRAVENOUS | Status: DC | PRN
  Administered 2018-10-19: 120 ug via INTRAVENOUS
  Administered 2018-10-19: 80 ug via INTRAVENOUS
  Administered 2018-10-19: 120 ug via INTRAVENOUS
  Administered 2018-10-19: 160 ug via INTRAVENOUS
  Administered 2018-10-19 (×2): 80 ug via INTRAVENOUS

## 2018-10-19 MED ORDER — OXYCODONE HCL 5 MG PO TABS: 5.0000 mg | ORAL_TABLET | ORAL | Status: DC | PRN

## 2018-10-19 MED ORDER — OXYCODONE HCL 5 MG/5ML PO SOLN: 5.0000 mg | Freq: Once | ORAL | Status: DC | PRN

## 2018-10-19 MED ORDER — MIDAZOLAM HCL 2 MG/2ML IJ SOLN
INTRAMUSCULAR | Status: AC
  Filled 2018-10-19: qty 2

## 2018-10-19 MED ORDER — ONDANSETRON HCL 4 MG/2ML IJ SOLN: 4.0000 mg | Freq: Four times a day (QID) | INTRAMUSCULAR | Status: DC | PRN

## 2018-10-19 MED ORDER — PANTOPRAZOLE SODIUM 40 MG PO TBEC
40.0000 mg | DELAYED_RELEASE_TABLET | Freq: Every day | ORAL | Status: DC
  Administered 2018-10-19 – 2018-10-21 (×3): 40 mg via ORAL
  Filled 2018-10-19 (×3): qty 1

## 2018-10-19 MED ORDER — CYCLOBENZAPRINE HCL 5 MG PO TABS
5.0000 mg | ORAL_TABLET | Freq: Three times a day (TID) | ORAL | Status: DC | PRN
  Administered 2018-10-19 – 2018-10-21 (×4): 5 mg via ORAL
  Filled 2018-10-19 (×4): qty 1

## 2018-10-19 MED ORDER — 0.9 % SODIUM CHLORIDE (POUR BTL) OPTIME
TOPICAL | Status: DC | PRN
  Administered 2018-10-19: 1000 mL

## 2018-10-19 MED ORDER — METOPROLOL TARTRATE 25 MG PO TABS
75.0000 mg | ORAL_TABLET | Freq: Two times a day (BID) | ORAL | Status: DC
  Administered 2018-10-19 – 2018-10-21 (×5): 75 mg via ORAL
  Filled 2018-10-19 (×5): qty 3

## 2018-10-19 MED ORDER — INSULIN DEGLUDEC 100 UNIT/ML ~~LOC~~ SOPN: 77.0000 [IU] | PEN_INJECTOR | Freq: Two times a day (BID) | SUBCUTANEOUS | Status: DC

## 2018-10-19 MED ORDER — FENTANYL CITRATE (PF) 100 MCG/2ML IJ SOLN
25.0000 ug | INTRAMUSCULAR | Status: DC | PRN
  Administered 2018-10-19 (×2): 25 ug via INTRAVENOUS

## 2018-10-19 MED ORDER — PROPOFOL 10 MG/ML IV BOLUS
INTRAVENOUS | Status: DC | PRN
  Administered 2018-10-19: 50 mg via INTRAVENOUS
  Administered 2018-10-19: 200 mg via INTRAVENOUS

## 2018-10-19 MED ORDER — LACTATED RINGERS IV SOLN
INTRAVENOUS | Status: DC
  Administered 2018-10-19: 08:00:00 via INTRAVENOUS

## 2018-10-19 MED ORDER — POLYETHYLENE GLYCOL 3350 17 G PO PACK
17.0000 g | PACK | Freq: Two times a day (BID) | ORAL | Status: DC
  Administered 2018-10-19 – 2018-10-21 (×5): 17 g via ORAL
  Filled 2018-10-19 (×5): qty 1

## 2018-10-19 MED ORDER — OXYCODONE HCL 5 MG PO TABS
10.0000 mg | ORAL_TABLET | ORAL | Status: DC | PRN
  Administered 2018-10-19: 10 mg via ORAL
  Administered 2018-10-19 – 2018-10-22 (×12): 15 mg via ORAL
  Filled 2018-10-19 (×9): qty 3
  Filled 2018-10-19: qty 2
  Filled 2018-10-19 (×3): qty 3

## 2018-10-19 MED ORDER — DOCUSATE SODIUM 100 MG PO CAPS
100.0000 mg | ORAL_CAPSULE | Freq: Two times a day (BID) | ORAL | Status: DC
  Administered 2018-10-19 – 2018-10-21 (×5): 100 mg via ORAL
  Filled 2018-10-19 (×5): qty 1

## 2018-10-19 MED ORDER — HYDRALAZINE HCL 25 MG PO TABS
25.0000 mg | ORAL_TABLET | Freq: Three times a day (TID) | ORAL | Status: DC
  Administered 2018-10-19 – 2018-10-22 (×8): 25 mg via ORAL
  Filled 2018-10-19 (×8): qty 1

## 2018-10-19 MED ORDER — ACETAMINOPHEN 10 MG/ML IV SOLN
INTRAVENOUS | Status: AC
  Filled 2018-10-19: qty 100

## 2018-10-19 MED ORDER — EPHEDRINE SULFATE-NACL 50-0.9 MG/10ML-% IV SOSY
PREFILLED_SYRINGE | INTRAVENOUS | Status: DC | PRN
  Administered 2018-10-19 (×5): 10 mg via INTRAVENOUS

## 2018-10-19 MED ORDER — INSULIN GLARGINE 100 UNIT/ML ~~LOC~~ SOLN
77.0000 [IU] | Freq: Two times a day (BID) | SUBCUTANEOUS | Status: DC
  Administered 2018-10-19 – 2018-10-21 (×5): 77 [IU] via SUBCUTANEOUS
  Filled 2018-10-19 (×8): qty 0.77

## 2018-10-19 MED ORDER — ACETAMINOPHEN 325 MG PO TABS
325.0000 mg | ORAL_TABLET | Freq: Four times a day (QID) | ORAL | Status: DC | PRN
  Administered 2018-10-20 – 2018-10-22 (×5): 650 mg via ORAL
  Filled 2018-10-19 (×5): qty 2

## 2018-10-19 MED ORDER — AMLODIPINE BESYLATE 5 MG PO TABS
5.0000 mg | ORAL_TABLET | Freq: Every day | ORAL | Status: DC
  Administered 2018-10-20 – 2018-10-21 (×2): 5 mg via ORAL
  Filled 2018-10-19 (×2): qty 1

## 2018-10-19 MED ORDER — METOCLOPRAMIDE HCL 5 MG PO TABS: 5.0000 mg | ORAL_TABLET | Freq: Three times a day (TID) | ORAL | Status: DC | PRN

## 2018-10-19 MED ORDER — INSULIN ASPART 100 UNIT/ML ~~LOC~~ SOLN
20.0000 [IU] | Freq: Three times a day (TID) | SUBCUTANEOUS | Status: DC
  Administered 2018-10-19 – 2018-10-22 (×9): 20 [IU] via SUBCUTANEOUS

## 2018-10-19 MED ORDER — FENTANYL CITRATE (PF) 100 MCG/2ML IJ SOLN
INTRAMUSCULAR | Status: AC
  Filled 2018-10-19: qty 2

## 2018-10-19 MED ORDER — ONDANSETRON HCL 4 MG/2ML IJ SOLN
INTRAMUSCULAR | Status: DC | PRN
  Administered 2018-10-19: 4 mg via INTRAVENOUS

## 2018-10-19 MED ORDER — TRAZODONE HCL 50 MG PO TABS
50.0000 mg | ORAL_TABLET | Freq: Every evening | ORAL | Status: DC | PRN
  Administered 2018-10-19 – 2018-10-21 (×3): 50 mg via ORAL
  Filled 2018-10-19 (×3): qty 1

## 2018-10-19 MED ORDER — ONDANSETRON HCL 4 MG/2ML IJ SOLN
INTRAMUSCULAR | Status: AC
  Filled 2018-10-19: qty 4

## 2018-10-19 MED ORDER — KETOROLAC TROMETHAMINE 30 MG/ML IJ SOLN
30.0000 mg | Freq: Once | INTRAMUSCULAR | Status: AC
  Administered 2018-10-19: 30 mg via INTRAVENOUS

## 2018-10-19 MED ORDER — LIDOCAINE 2% (20 MG/ML) 5 ML SYRINGE
INTRAMUSCULAR | Status: DC | PRN
  Administered 2018-10-19: 80 mg via INTRAVENOUS

## 2018-10-19 MED ORDER — METOCLOPRAMIDE HCL 5 MG/ML IJ SOLN: 5.0000 mg | Freq: Three times a day (TID) | INTRAMUSCULAR | Status: DC | PRN

## 2018-10-19 MED ORDER — OXYCODONE HCL 5 MG PO TABS
10.0000 mg | ORAL_TABLET | ORAL | Status: DC | PRN
  Administered 2018-10-19: 10 mg via ORAL
  Filled 2018-10-19 (×2): qty 2

## 2018-10-19 MED ORDER — LIDOCAINE 2% (20 MG/ML) 5 ML SYRINGE
INTRAMUSCULAR | Status: AC
  Filled 2018-10-19: qty 15

## 2018-10-19 MED ORDER — ACETAMINOPHEN 500 MG PO TABS
1000.0000 mg | ORAL_TABLET | Freq: Four times a day (QID) | ORAL | Status: AC
  Administered 2018-10-19 – 2018-10-20 (×4): 1000 mg via ORAL
  Filled 2018-10-19 (×4): qty 2

## 2018-10-19 MED ORDER — ACETAMINOPHEN 10 MG/ML IV SOLN
1000.0000 mg | Freq: Once | INTRAVENOUS | Status: AC
  Administered 2018-10-19: 1000 mg via INTRAVENOUS

## 2018-10-19 MED ORDER — MIDAZOLAM HCL 5 MG/5ML IJ SOLN
INTRAMUSCULAR | Status: DC | PRN
  Administered 2018-10-19: 2 mg via INTRAVENOUS

## 2018-10-19 MED ORDER — HYDROXYZINE HCL 10 MG PO TABS
10.0000 mg | ORAL_TABLET | Freq: Three times a day (TID) | ORAL | Status: DC
  Administered 2018-10-19 – 2018-10-21 (×7): 10 mg via ORAL
  Filled 2018-10-19 (×9): qty 1

## 2018-10-19 MED ORDER — PROPOFOL 10 MG/ML IV BOLUS
INTRAVENOUS | Status: AC
  Filled 2018-10-19: qty 40

## 2018-10-19 MED ORDER — LIRAGLUTIDE 18 MG/3ML ~~LOC~~ SOPN: 1.8000 mg | PEN_INJECTOR | Freq: Every day | SUBCUTANEOUS | Status: DC

## 2018-10-19 MED ORDER — CITALOPRAM HYDROBROMIDE 20 MG PO TABS
30.0000 mg | ORAL_TABLET | Freq: Every day | ORAL | Status: DC
  Administered 2018-10-20: 30 mg via ORAL
  Filled 2018-10-19 (×2): qty 1

## 2018-10-19 MED ORDER — PRAVASTATIN SODIUM 10 MG PO TABS
20.0000 mg | ORAL_TABLET | Freq: Every day | ORAL | Status: DC
  Administered 2018-10-19 – 2018-10-21 (×3): 20 mg via ORAL
  Filled 2018-10-19 (×3): qty 2

## 2018-10-19 MED ORDER — CHLORHEXIDINE GLUCONATE 4 % EX LIQD: 60.0000 mL | Freq: Once | CUTANEOUS | Status: DC

## 2018-10-19 MED ORDER — FENTANYL CITRATE (PF) 250 MCG/5ML IJ SOLN
INTRAMUSCULAR | Status: AC
  Filled 2018-10-19: qty 5

## 2018-10-19 MED ORDER — ONDANSETRON HCL 4 MG PO TABS: 4.0000 mg | ORAL_TABLET | Freq: Four times a day (QID) | ORAL | Status: DC | PRN

## 2018-10-19 MED ORDER — GEMFIBROZIL 600 MG PO TABS
600.0000 mg | ORAL_TABLET | Freq: Two times a day (BID) | ORAL | Status: DC
  Administered 2018-10-19 – 2018-10-21 (×5): 600 mg via ORAL
  Filled 2018-10-19 (×6): qty 1

## 2018-10-19 MED ORDER — SODIUM CHLORIDE 0.45 % IV SOLN: INTRAVENOUS | Status: DC

## 2018-10-19 MED ORDER — FENTANYL CITRATE (PF) 100 MCG/2ML IJ SOLN
INTRAMUSCULAR | Status: DC | PRN
  Administered 2018-10-19 (×4): 50 ug via INTRAVENOUS

## 2018-10-19 MED ORDER — OXYCODONE HCL 5 MG PO TABS: 5.0000 mg | ORAL_TABLET | Freq: Once | ORAL | Status: DC | PRN

## 2018-10-19 SURGICAL SUPPLY — 31 items
BLADE SAW RECIP 87.9 MT (BLADE) ×3 IMPLANT
BLADE SURG 21 STRL SS (BLADE) ×3 IMPLANT
CANISTER WOUNDNEG PRESSURE 500 (CANNISTER) ×3 IMPLANT
COVER SURGICAL LIGHT HANDLE (MISCELLANEOUS) ×3 IMPLANT
COVER WAND RF STERILE (DRAPES) IMPLANT
DRAPE EXTREMITY T 121X128X90 (DISPOSABLE) ×3 IMPLANT
DRAPE HALF SHEET 40X57 (DRAPES) ×3 IMPLANT
DRAPE INCISE IOBAN 66X45 STRL (DRAPES) ×3 IMPLANT
DRAPE U-SHAPE 47X51 STRL (DRAPES) ×6 IMPLANT
DRSG VAC ATS MED SENSATRAC (GAUZE/BANDAGES/DRESSINGS) IMPLANT
DURAPREP 26ML APPLICATOR (WOUND CARE) IMPLANT
ELECT REM PT RETURN 9FT ADLT (ELECTROSURGICAL) ×3
ELECTRODE REM PT RTRN 9FT ADLT (ELECTROSURGICAL) ×1 IMPLANT
GLOVE BIOGEL PI IND STRL 9 (GLOVE) ×1 IMPLANT
GLOVE BIOGEL PI INDICATOR 9 (GLOVE) ×2
GLOVE SURG ORTHO 9.0 STRL STRW (GLOVE) ×3 IMPLANT
GOWN STRL REUS W/ TWL XL LVL3 (GOWN DISPOSABLE) ×2 IMPLANT
GOWN STRL REUS W/TWL XL LVL3 (GOWN DISPOSABLE) ×4
KIT BASIN OR (CUSTOM PROCEDURE TRAY) ×3 IMPLANT
KIT TURNOVER KIT B (KITS) IMPLANT
MANIFOLD NEPTUNE II (INSTRUMENTS) ×3 IMPLANT
NS IRRIG 1000ML POUR BTL (IV SOLUTION) ×3 IMPLANT
PACK GENERAL/GYN (CUSTOM PROCEDURE TRAY) ×3 IMPLANT
PAD ARMBOARD 7.5X6 YLW CONV (MISCELLANEOUS) ×3 IMPLANT
PAD NEG PRESSURE SENSATRAC (MISCELLANEOUS) IMPLANT
PREVENA RESTOR ARTHOFORM 46X30 (CANNISTER) ×3 IMPLANT
STAPLER VISISTAT 35W (STAPLE) IMPLANT
SUT ETHILON 2 0 PSLX (SUTURE) ×9 IMPLANT
SUT SILK 2 0 (SUTURE) ×2
SUT SILK 2-0 18XBRD TIE 12 (SUTURE) ×1 IMPLANT
TOWEL OR 17X26 10 PK STRL BLUE (TOWEL DISPOSABLE) ×3 IMPLANT

## 2018-10-19 NOTE — Op Note (Signed)
10/19/2018  10:12 AM  PATIENT:  Kristopher Simon    Patient is a 53 year old gentleman who is status post right transtibial amputation.  Patient went home he slipped on the rug fell on the residual limb and sustained dehiscence of the residual limb.  Patient presents at this time for revision.  PRE-OPERATIVE DIAGNOSIS:  Dehiscence Left Below Knee Amputation  POST-OPERATIVE DIAGNOSIS:  Same  PROCEDURE:  REVISION LEFT BELOW KNEE AMPUTATION  SURGEON:  Newt Minion, MD  PHYSICIAN ASSISTANT:None ANESTHESIA:   General  PREOPERATIVE INDICATIONS:  [name] is a  53 y.o. male with a diagnosis of Dehiscence Left Below Knee Amputation who failed conservative measures and elected for surgical management.    The risks benefits and alternatives were discussed with the patient preoperatively including but not limited to the risks of infection, bleeding, nerve injury, cardiopulmonary complications, the need for revision surgery, among others, and the patient was willing to proceed.  OPERATIVE IMPLANTS: Praveena wound VAC with restore and customizable VAC dressing  @ENCIMAGES @  OPERATIVE FINDINGS: Dehiscence with large hematoma.  OPERATIVE PROCEDURE: Patient was brought the operating room and underwent a general anesthetic.  After adequate levels anesthesia were obtained patient's left lower extremity was prepped using ChloraPrep and draped into a sterile field a timeout was called.  A fishmouth incision was made around the necrotic wound.  This was carried down to the tibia distal 2 cm of the tibia and fibula were resected.  The vascular bundles were suture ligated with 2-0 silk.  The hematoma and involved tissue was resected.  The wound was irrigated with normal saline electrocautery was used for further hemostasis.  The deep and superficial fascia layers and skin was closed using 2-0 nylon.  A Praveena dressing was applied with the restore and customizable wound VAC dressings this had a good suction  fit.   DISCHARGE PLANNING:  Antibiotic duration: IV antibiotics 24 hours  Weightbearing: Nonweightbearing on the left  Pain medication: Opioid pathway  Dressing care/ Wound VAC: Continue wound VAC for 1 week  Ambulatory devices: Walker.  Discharge to: Home when there is no drainage in the wound VAC canister possible discharge Saturday or Sunday  Follow-up: In the office 1 week post operative.

## 2018-10-19 NOTE — Care Management Note (Addendum)
Case Management Note  Patient Details  Name: Kristopher Simon MRN: [id number] Date of Birth: Dec 22, 1965  Subjective/Objective:                    Action/Plan:  Patient from home with mother. Just discharged from [facility]. Active with [facility] for [facility], He had PT and OT but was discharged.Dan with [facility] aware of admission.  Patient's address is [place], [place]   Patient has wheelchair, 3 in 1 , tun bench , wheelchair, and walker at home.  Has Praveena wound VAC .  Await PT /OT evaluations.  Expected Discharge Date:                  Expected Discharge Plan:  [place]  In-House Referral:  NA  Discharge planning Services  CM Consult  Post Acute Care Choice:  Home Health Choice offered to:  Patient  DME Arranged:  N/A DME Agency:  NA  HH Arranged:    [place] Agency:  [facility]  Status of Service:  In process, will continue to follow  If discussed at Long Length of Stay Meetings, dates discussed:    Additional Comments:  Marilu Favre, RN 10/19/2018, 12:52 PM

## 2018-10-19 NOTE — Anesthesia Preprocedure Evaluation (Signed)
Anesthesia Evaluation  Patient identified by MRN, date of birth, ID band Patient awake    Reviewed: Allergy & Precautions, H&P , NPO status , Patient's Chart, lab work & pertinent test results  Airway Mallampati: II   Neck ROM: full    Dental   Pulmonary shortness of breath,    breath sounds clear to auscultation       Cardiovascular hypertension,  Rhythm:regular Rate:Normal  TTE (08/2018): EF 55%, normal valves   Neuro/Psych PSYCHIATRIC DISORDERS Anxiety Depression  Neuromuscular disease CVA    GI/Hepatic GERD  ,  Endo/Other  diabetes, Type 2Morbid obesity  Renal/GU Renal InsufficiencyRenal disease     Musculoskeletal  (+) Arthritis , Fibromyalgia -  Abdominal   Peds  Hematology   Anesthesia Other Findings   Reproductive/Obstetrics                             Anesthesia Physical Anesthesia Plan  ASA: III  Anesthesia Plan: General   Post-op Pain Management:    Induction: Intravenous  PONV Risk Score and Plan: 2 and Ondansetron, Dexamethasone, Midazolam and Treatment may vary due to age or medical condition  Airway Management Planned: LMA  Additional Equipment:   Intra-op Plan:   Post-operative Plan:   Informed Consent: I have reviewed the patients History and Physical, chart, labs and discussed the procedure including the risks, benefits and alternatives for the proposed anesthesia with the patient or authorized representative who has indicated his/her understanding and acceptance.       Plan Discussed with: CRNA, Anesthesiologist and Surgeon  Anesthesia Plan Comments:         Anesthesia Quick Evaluation

## 2018-10-19 NOTE — Anesthesia Procedure Notes (Signed)
Procedure Name: LMA Insertion Date/Time: 10/19/2018 9:27 AM Performed by: Gwyndolyn Saxon, CRNA Pre-anesthesia Checklist: Patient identified, Emergency Drugs available, Suction available and Patient being monitored Patient Re-evaluated:Patient Re-evaluated prior to induction Oxygen Delivery Method: Circle system utilized Preoxygenation: Pre-oxygenation with 100% oxygen Induction Type: IV induction Ventilation: Mask ventilation with difficulty and Oral airway inserted - appropriate to patient size LMA: LMA inserted LMA Size: 4.0 Number of attempts: 1 Airway Equipment and Method: Patient positioned with wedge pillow Placement Confirmation: positive ETCO2 and breath sounds checked- equal and bilateral Tube secured with: Tape Dental Injury: Teeth and Oropharynx as per pre-operative assessment

## 2018-10-19 NOTE — H&P (Signed)
Kristopher Simon is an 53 y.o. male.   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  He developed dehiscence of the incision and presents for revision of his left transtibial amputation today.  Past Medical History:  Diagnosis Date  . Acute renal failure ([place]) 01/2012   CKD  . Anxiety   . Arthritis   . Cellulitis   . Cellulitis of left lower extremity 07/06/2014  . Concussion   . Coronary artery disease   . Depression   . Diabetes mellitus 1995   Type II  . DVT (deep venous thrombosis) ([place]) 2014   left leg  . Fibromyalgia   . Foot abscess, left 09/12/2018  . GERD (gastroesophageal reflux disease)   . Hypertension 1998  . Neuropathy   . PTSD (post-traumatic stress disorder)   . Stroke ([place])    x 3  memory loss - left hand- shakes at times- closes by it self- 06/2017- last one    Past Surgical History:  Procedure Laterality Date  . AMPUTATION Left 09/14/2018   Procedure: LEFT BELOW KNEE AMPUTATION;  Surgeon: Newt Minion, MD;  Location: [facility];  Service: Orthopedics;  Laterality: Left;  . CARDIAC CATHETERIZATION  2009   stent  . COLONOSCOPY    . HIP FRACTURE SURGERY Right 2014  . TONSILLECTOMY      Family History  Problem Relation Age of Onset  . Hypertension Mother   . Dementia Father   . Alzheimer's disease Father    Social History:  reports that he has never smoked. He has never used smokeless tobacco. He reports previous alcohol use of about 1.0 standard drinks of alcohol per week. He reports that he does not use drugs.  Allergies:  Allergies  Allergen Reactions  . Magnesium-Containing Compounds Anaphylaxis  . Sulfamethoxazole-Trimethoprim Anaphylaxis and Swelling     tongue swells  . Pregabalin Swelling    SWELLING REACTION  UNSPECIFIED     No medications prior to admission.    No results found for this or any previous visit (from the past 48 hour(s)). No results found.  Review of Systems  All other systems reviewed and are negative.   There were no vitals taken for this visit. Physical Exam  Constitutional: He is oriented to person, place, and time. He appears well-developed and well-nourished. No distress.  HENT:  Head: Normocephalic and atraumatic.  Neck: No tracheal deviation present. No thyromegaly present.  Cardiovascular: Normal rate.  Respiratory: Effort normal. No stridor. No respiratory distress.  GI: Soft. He exhibits no distension.  Musculoskeletal:     Comments: Left transtibial amputation site- Incision is healing well laterally. Medially has an area that is 2.5 cm long that has dehisced. Blood clot in wound, this was debrided and removed today, fig sized clot removed. Mild bloody drainage. No surrounding erythema. No odor. No sign of infection.  Neurological: He is alert and oriented to person, place, and time. No cranial nerve deficit. Coordination normal.  Skin: Skin is warm.  Psychiatric: He has a normal mood and affect. His behavior is normal. Judgment and thought content normal.     Assessment/Plan Dehiscence of left transtibial amputation site- plan revision of left transtibial amputation.  The procedure possible risks and benefits including the risks of bleeding, infection, neurovascular injury, and  possible need for further surgery was discussed with the patient.  The patient's questions were answered to his satisfaction and the patient wishes to proceed at this time.  Erlinda Hong, PA-C 10/19/2018, 7:25 AM [facility] [phone]

## 2018-10-19 NOTE — Anesthesia Postprocedure Evaluation (Signed)
Anesthesia Post Note  Patient: Kristopher Simon  Procedure(s) Performed: REVISION LEFT BELOW KNEE AMPUTATION (Left )     Anesthesia Post Evaluation  Last Vitals:  Vitals:   10/19/18 1130 10/19/18 1200  BP: (!) 107/56 107/60  Pulse: 70 71  Resp: 14 12  Temp: 36.7 C 36.7 C  SpO2: 97% 99%    Last Pain:  Vitals:   10/19/18 1200  PainSc: [place]

## 2018-10-19 NOTE — Progress Notes (Signed)
Arrived to 6n17  From PACU at this time.

## 2018-10-19 NOTE — Transfer of Care (Signed)
Immediate Anesthesia Transfer of Care Note  Patient: Kristopher Simon  Procedure(s) Performed: REVISION LEFT BELOW KNEE AMPUTATION (Left )  Patient Location: PACU  Anesthesia Type:General  Level of Consciousness: awake, alert  and oriented  Airway & Oxygen Therapy: Patient Spontanous Breathing and Patient connected to face mask oxygen  Post-op Assessment: Report given to RN and Post -op Vital signs reviewed and stable  Post vital signs: Reviewed and stable  Last Vitals:  Vitals Value Taken Time  BP 98/56 10/19/2018 10:14 AM  Temp    Pulse 58 10/19/2018 10:17 AM  Resp 8 10/19/2018 10:17 AM  SpO2 100 % 10/19/2018 10:17 AM  Vitals shown include unvalidated device data.  Last Pain:  Vitals:   10/19/18 0804  PainSc: 5       Patients Stated Pain Goal: 2 (09/73/53 2992)  Complications: No apparent anesthesia complications

## 2018-10-20 ENCOUNTER — Encounter (HOSPITAL_COMMUNITY): Payer: Self-pay | Admitting: Orthopedic Surgery

## 2018-10-20 LAB — GLUCOSE, CAPILLARY
GLUCOSE-CAPILLARY: 126 mg/dL — AB (ref 70–99)
GLUCOSE-CAPILLARY: 185 mg/dL — AB (ref 70–99)
Glucose-Capillary: 159 mg/dL — ABNORMAL HIGH (ref 70–99)
Glucose-Capillary: 174 mg/dL — ABNORMAL HIGH (ref 70–99)

## 2018-10-20 NOTE — Evaluation (Signed)
Occupational Therapy Evaluation Patient Details Name: Kristopher Simon MRN: [id number] DOB: 09-27-1965 Today's Date: 10/20/2018    History of Present Illness Pt is a 53 y/o male s/p L transtibial amputation 09/14/18 secondary to Charcot foot deformity. He developed dehiscence of the incision and underwent revision 10/19/18. PMH including but not limited to CAD, HTN, DM, stroke and anxiety.   Clinical Impression   Pt PTA: living with mother. Pt independent with maneuvering around small bathroom, usually using w/c and ambulating short distances with RW. Pt with DME and AE required for modified independent for ADL and mobility. Pt currently performing sit to stands and stand pivot transfers with RW to bariatric 3in1 (left in room). Pt able to reach RLE by hiking hip up and using arm rest for support. Pt with strong BUEs and RLE to perform all functional tasks. Pt with suitable home set-up for cooking, cleaning and mobility. Pt excited to eventually be fit for prosthetic. Pt at functional baseline. OT signing off with no acute needs.     Follow Up Recommendations  No OT follow up;Supervision - Intermittent    Equipment Recommendations  None recommended by OT    Recommendations for Other Services       Precautions / Restrictions Precautions Precautions: Other (comment);Fall Precaution Comments: wound vac      Mobility Bed Mobility Overal bed mobility: Modified Independent             General bed mobility comments: in recliner upon arrival.  Transfers Overall transfer level: Needs assistance Equipment used: Rolling walker (2 wheeled) Transfers: Sit to/from [facility] Sit to Stand: Supervision Stand pivot transfers: Supervision       General transfer comment: supervision for safety. No physical assist needed. No LOB.    Balance Overall balance assessment: Needs assistance Sitting-balance support: No upper extremity supported;Feet supported Sitting balance-Leahy  Scale: Good     Standing balance support: Bilateral upper extremity supported;During functional activity Standing balance-Leahy Scale: Fair Standing balance comment: reliant on RW                           ADL either performed or assessed with clinical judgement   ADL Overall ADL's : At baseline                                       General ADL Comments: Pt describing home set-up to OTR. pt transfers to [facility] with RW today very well. Pt set-upA for most ADL, may have assist with LB as bariatric walker is not in room.     Vision Baseline Vision/History: No visual deficits Vision Assessment?: No apparent visual deficits     Perception     Praxis      Pertinent Vitals/Pain Pain Assessment: No/denies pain Pain Score: 7  Pain Location: LLE Pain Descriptors / Indicators: Sore;Grimacing;Guarding Pain Intervention(s): Monitored during session;Repositioned     Hand Dominance     Extremity/Trunk Assessment Upper Extremity Assessment Upper Extremity Assessment: Overall WFL for tasks assessed   Lower Extremity Assessment Lower Extremity Assessment: LLE deficits/detail LLE Deficits / Details: s/p BKA revision, hip/knee ROM WFL   Cervical / Trunk Assessment Cervical / Trunk Assessment: Normal   Communication Communication Communication: No difficulties   Cognition Arousal/Alertness: Awake/alert Behavior During Therapy: WFL for tasks assessed/performed Overall Cognitive Status: Within Functional Limits for tasks assessed  General Comments  wound vac attached to LLE.    Exercises     Shoulder Instructions      Home Living Family/patient expects to be discharged to:: Private residence Living Arrangements: Parent Available Help at Discharge: Family;Available 24 hours/day Type of Home: House Home Access: Ramped entrance     Home Layout: One level     Bathroom Shower/Tub: Animal nutritionist: Standard Bathroom Accessibility: No(pt hops in BA)   Home Equipment: Bedside commode;Walker - 2 wheels;Wheelchair - Brewing technologist;Tub bench          Prior Functioning/Environment Level of Independence: Independent with assistive device(s)        Comments: wheelchair for primary mobility. Able to ambulate with RW up to 50 feet.        OT Problem List: Pain      OT Treatment/Interventions:      OT Goals(Current goals can be found in the care plan section) Acute Rehab OT Goals Patient Stated Goal: home  OT Frequency:     Barriers to D/C:            Co-evaluation              AM-PAC OT "6 Clicks" Daily Activity     Outcome Measure Help from another person eating meals?: None Help from another person taking care of personal grooming?: None Help from another person toileting, which includes using toliet, bedpan, or urinal?: None Help from another person bathing (including washing, rinsing, drying)?: A Little Help from another person to put on and taking off regular upper body clothing?: None Help from another person to put on and taking off regular lower body clothing?: None 6 Click Score: 23   End of Session Equipment Utilized During Treatment: Rolling walker Nurse Communication: Mobility status  Activity Tolerance: Patient tolerated treatment well Patient left: in chair;with call bell/phone within reach;with family/visitor present  OT Visit Diagnosis: Unsteadiness on feet (R26.81);Muscle weakness (generalized) (M62.81)                Time: 1340-1410 OT Time Calculation (min): 30 min Charges:  OT General Charges $OT Visit: 1 Visit OT Evaluation $OT Eval Moderate Complexity: 1 Mod  Darryl Nestle) Marsa Aris OTR/L Acute Rehabilitation Services Pager: [phone] Office: [phone]   Fredda Hammed 10/20/2018, 2:12 PM

## 2018-10-20 NOTE — Evaluation (Signed)
Physical Therapy Evaluation Patient Details Name: Kristopher Simon MRN: [id number] DOB: [birth year] Today's Date: 10/20/2018   History of Present Illness  Pt is a 53 y/o male s/p L transtibial amputation 09/14/18 secondary to Charcot foot deformity. He developed dehiscence of the incision and underwent revision 10/19/18. PMH including but not limited to CAD, HTN, DM, stroke and anxiety.    Clinical Impression  Pt admitted with above diagnosis. Pt currently with functional limitations due to the deficits listed below (see PT Problem List). PTA pt resided at home with his mother. He was mod I mobility utilizing wheelchair and RW for ambulation short distances. He recently finished a stay on CIR followed by HHPT. He was discharged from [facility] prior to admission. On eval, he demonstrated mod I bed mobility. Supervision provided for sit to stand and SPT with RW.  Pt will benefit from skilled PT to increase their independence and safety with mobility to allow discharge to the venue listed below.  Pt declining need for any further HHPT. PT in agreement. PT to follow acutely to ensure pt mobilizing prior to d/c home. He has all needed DME.     Follow Up Recommendations No PT follow up    Equipment Recommendations  None recommended by PT    Recommendations for Other Services       Precautions / Restrictions Precautions Precautions: Other (comment);Fall Precaution Comments: wound vac      Mobility  Bed Mobility Overal bed mobility: Modified Independent             General bed mobility comments: no assist needed  Transfers Overall transfer level: Needs assistance Equipment used: Rolling walker (2 wheeled) Transfers: Sit to/from [facility] Sit to Stand: Supervision Stand pivot transfers: Supervision       General transfer comment: supervision for safety. No physical assist needed. No LOB.  Ambulation/Gait             General Gait Details: deferred. Pt will need  bariatric RW.  Stairs            Wheelchair Mobility    Modified Rankin (Stroke Patients Only)       Balance Overall balance assessment: Needs assistance Sitting-balance support: No upper extremity supported;Feet supported Sitting balance-Leahy Scale: Good     Standing balance support: Bilateral upper extremity supported;During functional activity Standing balance-Leahy Scale: Poor Standing balance comment: reliant on RW, s/p L BKA                             Pertinent Vitals/Pain Pain Assessment: 0-10 Pain Score: 7  Pain Location: LLE Pain Descriptors / Indicators: Sore;Grimacing;Guarding Pain Intervention(s): Monitored during session;Repositioned    Home Living Family/patient expects to be discharged to:: Private residence Living Arrangements: Parent Available Help at Discharge: Family;Available 24 hours/day(mother) Type of Home: House Home Access: Ramped entrance     Home Layout: One level Home Equipment: Bedside commode;Walker - 2 wheels;Wheelchair - manual;Shower seat      Prior Function Level of Independence: Independent with assistive device(s)         Comments: wheelchair for primary mobility. Able to ambulate with RW up to 50 feet.     Hand Dominance        Extremity/Trunk Assessment   Upper Extremity Assessment Upper Extremity Assessment: Overall WFL for tasks assessed    Lower Extremity Assessment Lower Extremity Assessment: LLE deficits/detail LLE Deficits / Details: s/p BKA revision, hip/knee ROM [facility]  Cervical / Trunk Assessment Cervical / Trunk Assessment: Normal  Communication   Communication: No difficulties  Cognition Arousal/Alertness: Awake/alert Behavior During Therapy: WFL for tasks assessed/performed Overall Cognitive Status: Within Functional Limits for tasks assessed                                        General Comments General comments (skin integrity, edema, etc.): wound vac L  residual limb    Exercises     Assessment/Plan    PT Assessment Patient needs continued PT services  PT Problem List Decreased balance;Pain;Decreased mobility;Decreased activity tolerance;Obesity       PT Treatment Interventions Functional mobility training;Balance training;Patient/family education;Gait training;Therapeutic activities;Therapeutic exercise    PT Goals (Current goals can be found in the Care Plan section)  Acute Rehab PT Goals Patient Stated Goal: home PT Goal Formulation: With patient Time For Goal Achievement: 11/03/18 Potential to Achieve Goals: Good    Frequency Min 3X/week   Barriers to discharge        Co-evaluation               AM-PAC PT "6 Clicks" Mobility  Outcome Measure Help needed turning from your back to your side while in a flat bed without using bedrails?: None Help needed moving from lying on your back to sitting on the side of a flat bed without using bedrails?: None Help needed moving to and from a bed to a chair (including a wheelchair)?: None Help needed standing up from a chair using your arms (e.g., wheelchair or bedside chair)?: A Little Help needed to walk in hospital room?: A Little Help needed climbing 3-5 steps with a railing? : Total 6 Click Score: 19    End of Session   Activity Tolerance: Patient tolerated treatment well Patient left: in chair;with call bell/phone within reach Nurse Communication: Mobility status PT Visit Diagnosis: Difficulty in walking, not elsewhere classified (R26.2);Pain Pain - Right/Left: Left Pain - part of body: Leg    Time: 4801-6553 PT Time Calculation (min) (ACUTE ONLY): 19 min   Charges:   PT Evaluation $PT Eval Moderate Complexity: 1 Mod          Lorrin Goodell, PT  Office # [phone] Pager [phone]   Lorriane Shire 10/20/2018, 12:03 PM

## 2018-10-20 NOTE — Progress Notes (Signed)
Subjective: 1 Day Post-Op Procedure(s) (LRB): REVISION LEFT BELOW KNEE AMPUTATION (Left) Patient reports pain as moderate.    Objective: Vital signs in last 24 hours: Temp:  [98 F (36.7 C)-99 F (37.2 C)] 98.3 F (36.8 C) (02/29 5953) Pulse Rate:  [65-88] 85 (02/29 1003) Resp:  [9-20] 20 (02/29 0632) BP: (92-143)/(55-76) 110/65 (02/29 1003) SpO2:  [96 %-100 %] 99 % (02/29 9672)  Intake/Output from previous day: 02/28 0701 - 02/29 0700 In: 2060 [P.O.:1060; I.V.:1000] Out: 1850 [Urine:1450; Blood:400] Intake/Output this shift: No intake/output data recorded.  Recent Labs    10/19/18 0817  HGB 12.0*   Recent Labs    10/19/18 0817  WBC 11.9*  RBC 4.54  HCT 39.9  PLT 429*   Recent Labs    10/19/18 0817  NA 136  K 4.7  CL 103  CO2 23  BUN 23*  CREATININE 1.16  GLUCOSE 256*  CALCIUM 9.1   No results for input(s): LABPT, INR in the last 72 hours.  VAC dressing in place over the left transtibial amputation site and functioning well- No drainage in VAC canister.    Assessment/Plan: 1 Day Post-Op Procedure(s) (LRB): REVISION LEFT BELOW KNEE AMPUTATION (Left) Up out of bed.  May be able to DC to home later today with Prevena VAC  Follow up in the office next week.    Erlinda Hong, PA-C 10/20/2018, 10:16 AM  [employer] [phone]

## 2018-10-21 DIAGNOSIS — T8781 Dehiscence of amputation stump: Secondary | ICD-10-CM | POA: Diagnosis present

## 2018-10-21 DIAGNOSIS — Z882 Allergy status to sulfonamides status: Secondary | ICD-10-CM | POA: Diagnosis not present

## 2018-10-21 DIAGNOSIS — T8754 Necrosis of amputation stump, left lower extremity: Secondary | ICD-10-CM | POA: Diagnosis present

## 2018-10-21 DIAGNOSIS — Z79891 Long term (current) use of opiate analgesic: Secondary | ICD-10-CM | POA: Diagnosis not present

## 2018-10-21 DIAGNOSIS — N189 Chronic kidney disease, unspecified: Secondary | ICD-10-CM | POA: Diagnosis present

## 2018-10-21 DIAGNOSIS — Z888 Allergy status to other drugs, medicaments and biological substances status: Secondary | ICD-10-CM | POA: Diagnosis not present

## 2018-10-21 DIAGNOSIS — M797 Fibromyalgia: Secondary | ICD-10-CM | POA: Diagnosis present

## 2018-10-21 DIAGNOSIS — I2510 Atherosclerotic heart disease of native coronary artery without angina pectoris: Secondary | ICD-10-CM | POA: Diagnosis present

## 2018-10-21 DIAGNOSIS — E1122 Type 2 diabetes mellitus with diabetic chronic kidney disease: Secondary | ICD-10-CM | POA: Diagnosis present

## 2018-10-21 DIAGNOSIS — Z86718 Personal history of other venous thrombosis and embolism: Secondary | ICD-10-CM | POA: Diagnosis not present

## 2018-10-21 DIAGNOSIS — Z794 Long term (current) use of insulin: Secondary | ICD-10-CM | POA: Diagnosis not present

## 2018-10-21 DIAGNOSIS — M1990 Unspecified osteoarthritis, unspecified site: Secondary | ICD-10-CM | POA: Diagnosis present

## 2018-10-21 DIAGNOSIS — Z7982 Long term (current) use of aspirin: Secondary | ICD-10-CM | POA: Diagnosis not present

## 2018-10-21 DIAGNOSIS — K219 Gastro-esophageal reflux disease without esophagitis: Secondary | ICD-10-CM | POA: Diagnosis present

## 2018-10-21 DIAGNOSIS — I69311 Memory deficit following cerebral infarction: Secondary | ICD-10-CM | POA: Diagnosis not present

## 2018-10-21 DIAGNOSIS — E11610 Type 2 diabetes mellitus with diabetic neuropathic arthropathy: Secondary | ICD-10-CM | POA: Diagnosis present

## 2018-10-21 DIAGNOSIS — I129 Hypertensive chronic kidney disease with stage 1 through stage 4 chronic kidney disease, or unspecified chronic kidney disease: Secondary | ICD-10-CM | POA: Diagnosis present

## 2018-10-21 DIAGNOSIS — Z7902 Long term (current) use of antithrombotics/antiplatelets: Secondary | ICD-10-CM | POA: Diagnosis not present

## 2018-10-21 DIAGNOSIS — F4310 Post-traumatic stress disorder, unspecified: Secondary | ICD-10-CM | POA: Diagnosis present

## 2018-10-21 DIAGNOSIS — Z8249 Family history of ischemic heart disease and other diseases of the circulatory system: Secondary | ICD-10-CM | POA: Diagnosis not present

## 2018-10-21 DIAGNOSIS — Z820 Family history of epilepsy and other diseases of the nervous system: Secondary | ICD-10-CM | POA: Diagnosis not present

## 2018-10-21 LAB — GLUCOSE, CAPILLARY
Glucose-Capillary: 223 mg/dL — ABNORMAL HIGH (ref 70–99)
Glucose-Capillary: 151 mg/dL — ABNORMAL HIGH (ref 70–99)
Glucose-Capillary: 233 mg/dL — ABNORMAL HIGH (ref 70–99)
Glucose-Capillary: 254 mg/dL — ABNORMAL HIGH (ref 70–99)

## 2018-10-21 MED ORDER — CITALOPRAM HYDROBROMIDE 20 MG PO TABS
30.0000 mg | ORAL_TABLET | Freq: Every day | ORAL | Status: DC
  Administered 2018-10-21: 30 mg via ORAL
  Filled 2018-10-21: qty 2

## 2018-10-21 NOTE — Progress Notes (Signed)
Physical Therapy Treatment Patient Details Name: Kristopher Simon MRN: [id number] DOB: 01/04/1966 Today's Date: 10/21/2018    History of Present Illness Pt is a 53 y/o male s/p L transtibial amputation 09/14/18 secondary to Charcot foot deformity. He developed dehiscence of the incision and underwent revision 10/19/18. PMH including but not limited to CAD, HTN, DM, stroke and anxiety.    PT Comments    Pt able to transfer with RW without assistance. Has all equipment at home. Pt has no further questions for PT. He anticipates DC home tomorrow and reports no mobility issues in his home. PT will sign-off.   Follow Up Recommendations  No PT follow up     Equipment Recommendations  None recommended by PT    Recommendations for Other Services       Precautions / Restrictions Precautions Precautions: Fall Precaution Comments: wound vac    Mobility  Bed Mobility Overal bed mobility: Independent                Transfers Overall transfer level: Modified independent Equipment used: Rolling walker (2 wheeled) Transfers: Sit to/from [facility] Sit to Stand: Modified independent (Device/Increase time) Stand pivot transfers: Modified independent (Device/Increase time)       General transfer comment: (Pt demonstrates safe technique)  Ambulation/Gait Ambulation/Gait assistance: Modified independent (Device/Increase time) Gait Distance (Feet): 2 Feet Assistive device: Rolling walker (2 wheeled)       General Gait Details: Pt took two steps to recliner. Did not want to ambulate further   Stairs             Wheelchair Mobility    Modified Rankin (Stroke Patients Only)       Balance                                            Cognition Arousal/Alertness: Awake/alert Behavior During Therapy: WFL for tasks assessed/performed Overall Cognitive Status: Within Functional Limits for tasks assessed                                         Exercises      General Comments General comments (skin integrity, edema, etc.): No family present      Pertinent Vitals/Pain Pain Assessment: 0-10 Pain Score: (Pt did not rate) Pain Location: Left residual limb Pain Intervention(s): Premedicated before session;Monitored during session    Home Living                      Prior Function            PT Goals (current goals can now be found in the care plan section) Progress towards PT goals: Goals met/education completed, patient discharged from PT(Did not meet gait goal)    Frequency           PT Plan Other (comment)(Transfer goal met, pt deferred gait)    Co-evaluation              AM-PAC PT "6 Clicks" Mobility   Outcome Measure  Help needed turning from your back to your side while in a flat bed without using bedrails?: None Help needed moving from lying on your back to sitting on the side of a flat bed without using bedrails?: None Help needed moving to and from  a bed to a chair (including a wheelchair)?: None Help needed standing up from a chair using your arms (e.g., wheelchair or bedside chair)?: A Little Help needed to walk in hospital room?: A Little Help needed climbing 3-5 steps with a railing? : Total 6 Click Score: 19    End of Session   Activity Tolerance: Patient tolerated treatment well Patient left: in chair;with call bell/phone within reach Nurse Communication: Mobility status PT Visit Diagnosis: Difficulty in walking, not elsewhere classified (R26.2);Pain Pain - Right/Left: Left Pain - part of body: Leg     Time: 1674-2552 PT Time Calculation (min) (ACUTE ONLY): 21 min  Charges:  $Gait Training: 8-22 mins                     Lavonia Dana, PT   Acute Rehabilitation Services  Pager [phone] Office [phone] 10/21/2018    Melvern Banker 10/21/2018, 4:11 PM

## 2018-10-21 NOTE — Progress Notes (Signed)
Subjective: 2 Days Post-Op Procedure(s) (LRB): REVISION LEFT BELOW KNEE AMPUTATION (Left) Patient reports pain as moderate and severe last night, but much better this morning.Drainage into VAC canister 50 cc of serosanguinous appearing drainage.     Objective: Vital signs in last 24 hours: Temp:  [98.1 F (36.7 C)-99.9 F (37.7 C)] 98.1 F (36.7 C) (03/01 0554) Pulse Rate:  [83-97] 83 (03/01 0554) Resp:  [18-19] 18 (03/01 0554) BP: (110-148)/(65-71) 124/66 (03/01 0554) SpO2:  [96 %-99 %] 99 % (03/01 0554)  Intake/Output from previous day: 02/29 0701 - 03/01 0700 In: 480 [P.O.:480] Out: 1400 [Urine:1400] Intake/Output this shift: No intake/output data recorded.  Recent Labs    10/19/18 0817  HGB 12.0*   Recent Labs    10/19/18 0817  WBC 11.9*  RBC 4.54  HCT 39.9  PLT 429*   Recent Labs    10/19/18 0817  NA 136  K 4.7  CL 103  CO2 23  BUN 23*  CREATININE 1.16  GLUCOSE 256*  CALCIUM 9.1   No results for input(s): LABPT, INR in the last 72 hours.  VAC dressing in place over the left transtibial amputation site and functioning well. Canister with 50 cc of serosanguinous appearing drainage.   Assessment/Plan: 2 Days Post-Op Procedure(s) (LRB): REVISION LEFT BELOW KNEE AMPUTATION (Left) Up with therapies.  Continue VAC dressing and monitor in hospital as developed increased drainage overnight and for pain control.  Will hopefully DC tomorrow with Prevena VAC.  Has HHC already set up through [place].   Erlinda Hong, PA-C 10/21/2018, 7:39 AM  [facility]

## 2018-10-21 NOTE — Progress Notes (Signed)
PT Cancellation Note  Patient Details Name: Kristopher Simon MRN: [id number] DOB: 01-12-1966   Cancelled Treatment:    Reason Eval/Treat Not Completed: Pt in bed in middle of eating breakfast. Agreeable to PT later today if able to find another chair as he reports the back of his chair is broken. That his sister had to prop the walker behind it yesterday to keep the back from falling all the way back. Also stated he had significant increase in pain with sitting in chair yesterday and does not want to sit up that long today. Nursing made aware of chair issue and NT is looking for one in empty rooms. PT to return today as time allows, if not pt will pivot to chair with NT. Acute PT to continue during pt's hospital stay.   Willow Ora, PTA, CLT Acute Rehab Services [phone] 10/21/18, 10:13 AM   Willow Ora 10/21/2018, 10:11 AM

## 2018-10-21 NOTE — Progress Notes (Signed)
Pt had a breakthrough pain at 10/10 to stomp and knee of Left limb. Repositioned limb and kept elevated. Pt still in severe pain. IV dilaudid given. Pain went down to 7/10.

## 2018-10-22 LAB — GLUCOSE, CAPILLARY: Glucose-Capillary: 271 mg/dL — ABNORMAL HIGH (ref 70–99)

## 2018-10-22 MED ORDER — OXYCODONE-ACETAMINOPHEN 5-325 MG PO TABS: 1.0000 | ORAL_TABLET | ORAL | 0 refills | Status: DC | PRN

## 2018-10-22 NOTE — Progress Notes (Signed)
Pt given discharge education on how to use his wound vac, how to disconnect and reconnect device when changing his clothes.  Pt given his discharge instructions for home. Pt left via w/c to front of building to meet with is ride home.

## 2018-10-22 NOTE — Discharge Summary (Signed)
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 male who was admitted 10/19/2018 with a chief complaint of dehiscence amputated stump, with a final diagnosis of Dehiscence Left Below Knee Amputation.  Patient was brought to the operating room on 10/19/2018 and underwent Procedure(s): REVISION LEFT BELOW KNEE AMPUTATION.    Patient was given perioperative antibiotics:  Anti-infectives (From admission, onward)   Start     Dose/Rate Route Frequency Ordered Stop   10/19/18 0915  ceFAZolin (ANCEF) 3 g in dextrose 5 % 50 mL IVPB     3 g 100 mL/hr over 30 Minutes Intravenous To ShortStay Surgical 10/18/18 1352 10/19/18 0957    .  Patient was given sequential compression devices, early ambulation, and aspirin for DVT prophylaxis.  Recent vital signs:  Patient Vitals for the past 24 hrs:  BP Temp Temp src Pulse Resp SpO2  10/22/18 0421 (!) 145/72 98.1 F (36.7 C) Oral 89 12 97 %  10/21/18 2112 (!) 146/53 98.3 F (36.8 C) Oral 89 16 99 %  10/21/18 1453 - 98.6 F (37 C) Oral 84 18 100 %  10/21/18 1415 (!) 126/58 - - - - -  10/21/18 0914 122/65 - - 76 - -  .  Recent laboratory studies: No results found.  Discharge Medications:   Allergies as of 10/22/2018      Reactions   Magnesium-containing Compounds Anaphylaxis   Sulfamethoxazole-trimethoprim Anaphylaxis, Swelling    tongue swells   Pregabalin Swelling   SWELLING REACTION UNSPECIFIED       Medication List    STOP taking these medications   doxycycline 100 MG tablet Commonly known as:  VIBRA-TABS   oxyCODONE 5 MG immediate release tablet Commonly known as:  Oxy IR/ROXICODONE     TAKE these medications   acetaminophen 325 MG tablet Commonly known as:   TYLENOL Take 1-2 tablets (325-650 mg total) by mouth every 4 (four) hours as needed for mild pain.   amLODipine 5 MG tablet Commonly known as:  NORVASC Take 1 tablet (5 mg total) by mouth daily.   aspirin EC 81 MG tablet Take 1 tablet (81 mg total) by mouth daily.   citalopram 10 MG tablet Commonly known as:  CELEXA Take 3 tablets (30 mg total) by mouth daily.   clopidogrel 75 MG tablet Commonly known as:  PLAVIX Take 75 mg by mouth daily.   cyclobenzaprine 5 MG tablet Commonly known as:  FLEXERIL Take 1 tablet (5 mg total) by mouth 3 (three) times daily as needed for muscle spasms.   esomeprazole 40 MG capsule Commonly known as:  NEXIUM Take 1 capsule (40 mg total) by mouth every morning.   gabapentin 300 MG capsule Commonly known as:  NEURONTIN Take 1 capsule (300 mg total) by mouth 3 (three) times daily.   gemfibrozil 600 MG tablet Commonly known as:  LOPID Take 1 tablet (600 mg total) by mouth 2 (two) times daily before a meal.   hydrALAZINE 25 MG tablet Commonly known as:  APRESOLINE Take 1 tablet (25 mg total) by mouth every 8 (eight) hours.   hydrocerin Crea Apply 1 application topically 2 (two) times daily. To dry skin on right foot/leg   hydrOXYzine 10 MG tablet Commonly known as:  ATARAX/VISTARIL Take 1  tablet (10 mg total) by mouth 3 (three) times daily.   insulin aspart 100 UNIT/ML injection Commonly known as:  novoLOG Inject 20 Units into the skin 3 (three) times daily with meals. What changed:  how much to take   insulin detemir 100 UNIT/ML injection Commonly known as:  LEVEMIR Inject 0.7 mLs (70 Units total) into the skin 2 (two) times daily.   Metoprolol Tartrate 75 MG Tabs Take 75 mg by mouth 2 (two) times daily.   NUCYNTA 100 MG Tabs Generic drug:  Tapentadol HCl Take 1 tablet (100 mg total) by mouth every 6 (six) hours. After a week or so --then try weaning to as needed as pain gets better. What changed:  additional instructions    oxyCODONE-acetaminophen 5-325 MG tablet Commonly known as:  PERCOCET/ROXICET Take 1 tablet by mouth every 4 (four) hours as needed.   polyethylene glycol packet Commonly known as:  MIRALAX / GLYCOLAX Take 17 g by mouth 2 (two) times daily.   pravastatin 20 MG tablet Commonly known as:  PRAVACHOL Take 20 mg by mouth at bedtime.   saccharomyces boulardii 250 MG capsule Commonly known as:  FLORASTOR Take 1 capsule (250 mg total) by mouth 2 (two) times daily.   traZODone 50 MG tablet Commonly known as:  DESYREL Take 0.5-1 tablets (25-50 mg total) by mouth at bedtime as needed for sleep.   TRESIBA FLEXTOUCH 100 UNIT/ML Sopn FlexTouch Pen Generic drug:  insulin degludec Inject 77 Units into the skin 2 (two) times daily.   VICTOZA 18 MG/3ML Sopn Generic drug:  liraglutide Inject 0.3 mLs (1.8 mg total) into the skin daily. What changed:  when to take this       Diagnostic Studies: Dg Knee Complete 4 Views Left  Result Date: 09/26/2018 CLINICAL DATA:  Left leg pain at the amputation site EXAM: LEFT KNEE - COMPLETE 4+ VIEW COMPARISON:  None. FINDINGS: Left below the knee amputation with surgical staples in the soft tissues. No soft tissue emphysema. No acute fracture or dislocation. No periosteal reaction or bone destruction. Moderate medial femorotibial compartment joint space narrowing. Small lateral femorotibial compartment marginal osteophytes. Mild patellofemoral compartment joint space narrowing. No knee joint effusion. IMPRESSION: 1. Below the knee amputation.  No acute osseous abnormality. Electronically Signed   By: Kathreen Devoid   On: 09/26/2018 21:31    Patient benefited maximally from their hospital stay and there were no complications.     Disposition: Discharge disposition: 01-Home or Self Care      Discharge Instructions    Call MD / Call 911   Complete by:  As directed    If you experience chest pain or shortness of breath, CALL 911 and be transported to the  hospital emergency room.  If you develope a fever above 101 F, pus (white drainage) or increased drainage or redness at the wound, or calf pain, call your surgeon's office.   Constipation Prevention   Complete by:  As directed    Drink plenty of fluids.  Prune juice may be helpful.  You may use a stool softener, such as Colace (over the counter) 100 mg twice a day.  Use MiraLax (over the counter) for constipation as needed.   Diet - low sodium heart healthy   Complete by:  As directed    Increase activity slowly as tolerated   Complete by:  As directed    Negative Pressure Wound Therapy - Incisional   Complete by:  As directed    Attach  to portable pravena vac     Follow-up Information    Newt Minion, MD In 1 week.   Specialty:  Orthopedic Surgery Contact information: [place] [address] [phone]            Signed: Newt Minion 10/22/2018, 7:18 AM

## 2018-10-23 ENCOUNTER — Telehealth (INDEPENDENT_AMBULATORY_CARE_PROVIDER_SITE_OTHER): Payer: Self-pay | Admitting: Orthopedic Surgery

## 2018-10-23 NOTE — Telephone Encounter (Signed)
Message sent in error

## 2018-10-24 ENCOUNTER — Ambulatory Visit (INDEPENDENT_AMBULATORY_CARE_PROVIDER_SITE_OTHER): Payer: Medicaid Other | Admitting: Family

## 2018-10-24 ENCOUNTER — Encounter (INDEPENDENT_AMBULATORY_CARE_PROVIDER_SITE_OTHER): Payer: Self-pay | Admitting: Family

## 2018-10-24 VITALS — Ht 77.0 in | Wt 381.0 lb

## 2018-10-24 DIAGNOSIS — Z89512 Acquired absence of left leg below knee: Secondary | ICD-10-CM
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
## 2018-10-24 NOTE — Addendum Note (Signed)
Addended by: Dondra Prader R on: 10/24/2018 01:31 PM   Modules accepted: Orders

## 2018-10-26 ENCOUNTER — Telehealth (INDEPENDENT_AMBULATORY_CARE_PROVIDER_SITE_OTHER): Payer: Self-pay | Admitting: Family

## 2018-10-26 ENCOUNTER — Inpatient Hospital Stay (INDEPENDENT_AMBULATORY_CARE_PROVIDER_SITE_OTHER): Payer: Medicaid Other | Admitting: Physician Assistant

## 2018-10-26 NOTE — Telephone Encounter (Signed)
Received voicemail message from [place] with [facility] stating she got the [facility] referral which was declined by [place]. The number to contact Santiago Glad is [phone]

## 2018-10-29 ENCOUNTER — Other Ambulatory Visit (INDEPENDENT_AMBULATORY_CARE_PROVIDER_SITE_OTHER): Payer: Self-pay | Admitting: Orthopedic Surgery

## 2018-10-29 MED ORDER — OXYCODONE-ACETAMINOPHEN 5-325 MG PO TABS: 1.0000 | ORAL_TABLET | ORAL | 0 refills | Status: DC | PRN

## 2018-10-29 NOTE — Telephone Encounter (Signed)
S/p BKA do you wish to refill?

## 2018-10-29 NOTE — Telephone Encounter (Signed)
duda patient.  

## 2018-10-29 NOTE — Telephone Encounter (Signed)
rx sent to pharmacy

## 2018-10-30 ENCOUNTER — Other Ambulatory Visit: Payer: Self-pay

## 2018-10-30 ENCOUNTER — Telehealth: Payer: Self-pay

## 2018-10-30 NOTE — Telephone Encounter (Signed)
PCP should fill. Thanks.

## 2018-10-30 NOTE — Telephone Encounter (Signed)
Recieved electronic medication refill request for Celexa,  No mention on who will continue to prescribe this medication in any note.  Please advise.

## 2018-11-02 ENCOUNTER — Other Ambulatory Visit (INDEPENDENT_AMBULATORY_CARE_PROVIDER_SITE_OTHER): Payer: Self-pay | Admitting: Orthopedic Surgery

## 2018-11-02 ENCOUNTER — Encounter: Payer: Medicaid Other | Attending: Registered Nurse | Admitting: Physical Medicine & Rehabilitation

## 2018-11-02 ENCOUNTER — Other Ambulatory Visit: Payer: Self-pay

## 2018-11-02 ENCOUNTER — Encounter: Payer: Self-pay | Admitting: Physical Medicine & Rehabilitation

## 2018-11-02 VITALS — BP 141/65 | HR 87 | Ht 77.0 in | Wt 381.0 lb

## 2018-11-02 DIAGNOSIS — R269 Unspecified abnormalities of gait and mobility: Secondary | ICD-10-CM

## 2018-11-02 DIAGNOSIS — E1142 Type 2 diabetes mellitus with diabetic polyneuropathy: Secondary | ICD-10-CM | POA: Diagnosis not present

## 2018-11-02 DIAGNOSIS — I2510 Atherosclerotic heart disease of native coronary artery without angina pectoris: Secondary | ICD-10-CM | POA: Insufficient documentation

## 2018-11-02 DIAGNOSIS — I10 Essential (primary) hypertension: Secondary | ICD-10-CM

## 2018-11-02 DIAGNOSIS — Z8249 Family history of ischemic heart disease and other diseases of the circulatory system: Secondary | ICD-10-CM | POA: Insufficient documentation

## 2018-11-02 DIAGNOSIS — E1169 Type 2 diabetes mellitus with other specified complication: Secondary | ICD-10-CM | POA: Diagnosis not present

## 2018-11-02 DIAGNOSIS — E669 Obesity, unspecified: Secondary | ICD-10-CM

## 2018-11-02 DIAGNOSIS — G8918 Other acute postprocedural pain: Secondary | ICD-10-CM

## 2018-11-02 DIAGNOSIS — Z89512 Acquired absence of left leg below knee: Secondary | ICD-10-CM

## 2018-11-02 NOTE — Telephone Encounter (Signed)
Pt is requesting a refill on Percocet 5/325 last refill was 10/29/18 #30 and 10/22/18 #30 also receives Nucynta 100 mg # 120 for Reesa Chew, [place] this was filled 09/26/18

## 2018-11-02 NOTE — Progress Notes (Addendum)
Subjective:    Patient ID: Kristopher Simon, male    DOB: April 13, 1966, 53 y.o.   MRN: [id number]  HPI 53 year old male with history of T2DM-poorly controlled, morbid obesity-BMI 43, hypertension, CAD, left foot with Charcot deformity, protein calorie malnutrition presents for follow up for left BKA.    Last clinic 10/08/18.  Seen by NP, notes reviewed.   Prior to that, he had a fall on his stump and required revision.  He saw Ortho last week. He had a VAC that has since been removed. He is getting Percocet from Ortho. BP is controlled. CBGs have been controlled, per pt.  He is not taking Klonopin. Denies falls. He states he has lost weight.  Therapies: Completed HH DME: Shower chair, bedside commode.  Mobility: Walker at home, wheelchair in community.  Pain Inventory Average Pain 6 Pain Right Now 7 My pain is constant, sharp, burning, dull, stabbing, tingling and aching 1. S/P Left BKA: Dr. Sharol Given Following. Continue to Monitor. Continue Home Health Therapy.  2. Type 2 DM/ Peripheral Neuropathy:Continue Gabapentin. Continue to Monitor. PCP Following.  3.Essential Hypertension: Continue current medication regimen: PCP Following. Continue to Monitor.  4. Post-operative Pain: Continue Nucynta 100 mg one tablet evey 6 hours as needed for pain   30  minutes of face to face patient care time was spent during this visit. All questions were encouraged and answered.   F/U in 4-6 weeks with Dr Posey Pronto   In the last 24 hours, has pain interfered with the following? General activity 6 Relation with others 6 Enjoyment of life 4 What TIME of day is your pain at its worst? morning and night Sleep (in general) Fair  Pain is worse with: sitting, inactivity and some activites Pain improves with: rest and medication Relief from Meds: 6  Mobility use a walker how many minutes can you walk? 5 to 10 ability to climb steps?  yes do you drive?  yes use a wheelchair transfers alone  Function disabled:  date disabled Feb 2019  Neuro/Psych trouble walking depression anxiety  Prior Studies Any changes since last visit?  yes  Physicians involved in your care Any changes since last visit?  yes   Family History  Problem Relation Age of Onset  . Hypertension Mother   . Dementia Father   . Alzheimer's disease Father    Social History   Socioeconomic History  . Marital status: Divorced    Spouse name: Not on file  . Number of children: Not on file  . Years of education: Not on file  . Highest education level: Not on file  Occupational History  . Not on file  Social Needs  . Financial resource strain: Not on file  . Food insecurity:    Worry: Not on file    Inability: Not on file  . Transportation needs:    Medical: Not on file    Non-medical: Not on file  Tobacco Use  . Smoking status: Never Smoker  . Smokeless tobacco: Never Used  Substance and Sexual Activity  . Alcohol use: Not Currently    Alcohol/week: 1.0 standard drinks    Types: 1 Glasses of wine per week    Comment: rarely  . Drug use: No  . Sexual activity: Yes    Birth control/protection: None  Lifestyle  . Physical activity:    Days per week: Not on file    Minutes per session: Not on file  . Stress: Not on file  Relationships  . Social  connections:    Talks on phone: Not on file    Gets together: Not on file    Attends religious service: Not on file    Active member of club or organization: Not on file    Attends meetings of clubs or organizations: Not on file    Relationship status: Not on file  Other Topics Concern  . Not on file  Social History Narrative  . Not on file   Past Surgical History:  Procedure Laterality Date  . AMPUTATION Left 09/14/2018   Procedure: LEFT BELOW KNEE AMPUTATION;  Surgeon: Newt Minion, MD;  Location: [place];  Service: Orthopedics;  Laterality: Left;  . CARDIAC CATHETERIZATION  2009   stent  . COLONOSCOPY    . HIP FRACTURE SURGERY Right 2014  . STUMP  REVISION Left 10/19/2018  . STUMP REVISION Left 10/19/2018   Procedure: REVISION LEFT BELOW KNEE AMPUTATION;  Surgeon: Newt Minion, MD;  Location: [facility];  Service: Orthopedics;  Laterality: Left;  . TONSILLECTOMY     Past Medical History:  Diagnosis Date  . Acute renal failure ([place]) 01/2012   CKD  . Anxiety   . Arthritis   . Cellulitis   . Cellulitis of left lower extremity 07/06/2014  . Concussion   . Coronary artery disease   . Depression   . Diabetes mellitus 1995   Type II  . DVT (deep venous thrombosis) ([place]) 2014   left leg  . Fibromyalgia   . Foot abscess, left 09/12/2018  . GERD (gastroesophageal reflux disease)   . Hypertension 1998  . Neuropathy   . PTSD (post-traumatic stress disorder)   . Stroke ([place])    x 3  memory loss - left hand- shakes at times- closes by it self- 06/2017- last one   BP (!) 141/65   Pulse 87   Ht 6\' 5"  (1.956 m)   Wt (!) 381 lb (172.8 kg)   SpO2 94%   BMI 45.18 kg/m   Opioid Risk Score:   Fall Risk Score:  `1  Depression screen PHQ 2/9  Depression screen [facility] 2/9 11/02/2018 10/08/2018  Decreased Interest 1 2  Down, Depressed, Hopeless 1 2  PHQ - 2 Score 2 4  Altered sleeping - 2  Tired, decreased energy - 2  Change in appetite - 0  Feeling bad or failure about yourself  - 1  Trouble concentrating - 2  Moving slowly or fidgety/restless - 1  Suicidal thoughts - 0  PHQ-9 Score - 12  Difficult doing work/chores - Somewhat difficult    Review of Systems  Constitutional: Negative.   HENT: Negative.   Eyes: Negative.   Respiratory: Negative.   Cardiovascular: Negative.   Gastrointestinal: Negative.   Endocrine: Negative.   Genitourinary: Negative.   Musculoskeletal: Negative.   Skin: Negative.   Allergic/Immunologic: Negative.   Neurological: Negative.   Psychiatric/Behavioral: Negative.   All other systems reviewed and are negative.      Objective:   Physical Exam Constitutional: No distress . Vital signs reviewed.  HENT: Normocephalic.  Atraumatic. Eyes: EOMI. No discharge. Cardiovascular: RRR. No JVD. Respiratory: CTA bilaterally. Normal effort. GI: BS +. Non-distended. Musculoskeletal: L-BKA with edema Neurological: He is alert and oriented.  Motor: B/l UE: 5/5 proximal to to distal RLE: 5/5  LLE: HF: 5/5 Skin: Left BKA with stitched and serosanguinous drainage Psychiatric: He has a normal mood and affect. His behavior is normal.     Assessment & Plan:  53 year old male with history  of T2DM-poorly controlled, morbid obesity-BMI 27, hypertension, CAD, left foot with Charcot deformity, protein calorie malnutrition presents for follow up for left BKA.    1. 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  Cont HEP  Cont follow up with Ortho  2. Chronic pain/Pain Management:   Meds per Ortho/Pain management   3. HTN  Cont meds  Cont meds per PCP  4. T2DM poorly controlled:   Cont meds  Started relatively controlled at present  5. Morbid obesity:   Cont weight loss  6. Gait abnormality  Cont wheelchair for safety  Cont HEP  >25 minutes spent with patient with >20 minutes spent discussing above

## 2018-11-05 ENCOUNTER — Telehealth (INDEPENDENT_AMBULATORY_CARE_PROVIDER_SITE_OTHER): Payer: Self-pay

## 2018-11-05 ENCOUNTER — Other Ambulatory Visit (INDEPENDENT_AMBULATORY_CARE_PROVIDER_SITE_OTHER): Payer: Self-pay | Admitting: Orthopedic Surgery

## 2018-11-05 MED ORDER — OXYCODONE-ACETAMINOPHEN 5-325 MG PO TABS: 1.0000 | ORAL_TABLET | ORAL | 0 refills | Status: DC | PRN

## 2018-11-05 NOTE — Telephone Encounter (Signed)
rx sent

## 2018-11-05 NOTE — Telephone Encounter (Signed)
Patient was called and informed of Rx and stated that he possibly hit the stump area on left leg the other day and I informed him that he should come in and he stated that its not draining heavily and he wants to keep his appt for Wed. I stated if he needs to call back and schedule an appt then he can do so. He agreed.

## 2018-11-05 NOTE — Telephone Encounter (Signed)
Patient was called and informed about Rx

## 2018-11-05 NOTE — Telephone Encounter (Signed)
Pt requesting refill on percocet 5/325 has received 60 already this month with the last refill 10/29/2018 #30 please advise.

## 2018-11-07 ENCOUNTER — Ambulatory Visit (INDEPENDENT_AMBULATORY_CARE_PROVIDER_SITE_OTHER): Payer: Medicaid Other | Admitting: Family

## 2018-11-07 ENCOUNTER — Encounter (INDEPENDENT_AMBULATORY_CARE_PROVIDER_SITE_OTHER): Payer: Self-pay | Admitting: Family

## 2018-11-07 ENCOUNTER — Other Ambulatory Visit: Payer: Self-pay

## 2018-11-07 DIAGNOSIS — T8781 Dehiscence of amputation stump: Secondary | ICD-10-CM

## 2018-11-07 DIAGNOSIS — Z89512 Acquired absence of left leg below knee: Secondary | ICD-10-CM
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
## 2018-11-14 ENCOUNTER — Other Ambulatory Visit: Payer: Self-pay

## 2018-11-14 ENCOUNTER — Ambulatory Visit (INDEPENDENT_AMBULATORY_CARE_PROVIDER_SITE_OTHER): Payer: Medicaid Other | Admitting: Family

## 2018-11-14 ENCOUNTER — Encounter (INDEPENDENT_AMBULATORY_CARE_PROVIDER_SITE_OTHER): Payer: Self-pay | Admitting: Family

## 2018-11-14 VITALS — Ht 77.0 in | Wt 381.0 lb

## 2018-11-14 DIAGNOSIS — Z89512 Acquired absence of left leg below knee: Secondary | ICD-10-CM

## 2018-11-14 MED ORDER — OXYCODONE-ACETAMINOPHEN 5-325 MG PO TABS: 1.0000 | ORAL_TABLET | Freq: Four times a day (QID) | ORAL | 0 refills | Status: DC | PRN
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
## 2018-11-20 ENCOUNTER — Telehealth (INDEPENDENT_AMBULATORY_CARE_PROVIDER_SITE_OTHER): Payer: Self-pay

## 2018-11-20 NOTE — Telephone Encounter (Signed)
I called pt and answered NO to all COVID-19 questions. Pt has an appt tomorrow at 12:45pm

## 2018-11-21 ENCOUNTER — Other Ambulatory Visit: Payer: Self-pay

## 2018-11-21 ENCOUNTER — Encounter (INDEPENDENT_AMBULATORY_CARE_PROVIDER_SITE_OTHER): Payer: Self-pay | Admitting: Family

## 2018-11-21 ENCOUNTER — Ambulatory Visit (INDEPENDENT_AMBULATORY_CARE_PROVIDER_SITE_OTHER): Payer: Medicaid Other | Admitting: Family

## 2018-11-21 VITALS — Ht 77.0 in | Wt 381.0 lb

## 2018-11-21 DIAGNOSIS — Z89512 Acquired absence of left leg below knee: Secondary | ICD-10-CM
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
## 2018-11-26 ENCOUNTER — Telehealth (INDEPENDENT_AMBULATORY_CARE_PROVIDER_SITE_OTHER): Payer: Self-pay

## 2018-11-26 NOTE — Telephone Encounter (Signed)
Called pt and he answered NO to all COVID-19 prescreen questions. Pt has an appt tomorrow.

## 2018-11-27 ENCOUNTER — Encounter (INDEPENDENT_AMBULATORY_CARE_PROVIDER_SITE_OTHER): Payer: Self-pay | Admitting: Orthopedic Surgery

## 2018-11-27 ENCOUNTER — Other Ambulatory Visit: Payer: Self-pay

## 2018-11-27 ENCOUNTER — Ambulatory Visit (INDEPENDENT_AMBULATORY_CARE_PROVIDER_SITE_OTHER): Payer: Medicaid Other | Admitting: Orthopedic Surgery

## 2018-11-27 VITALS — Ht 77.0 in | Wt 381.0 lb

## 2018-11-27 DIAGNOSIS — T8781 Dehiscence of amputation stump: Secondary | ICD-10-CM

## 2018-11-27 DIAGNOSIS — Z89512 Acquired absence of left leg below knee: Secondary | ICD-10-CM
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
## 2018-11-29 ENCOUNTER — Other Ambulatory Visit: Payer: Self-pay

## 2018-11-29 ENCOUNTER — Encounter (HOSPITAL_COMMUNITY): Payer: Self-pay | Admitting: *Deleted

## 2018-11-29 ENCOUNTER — Ambulatory Visit (INDEPENDENT_AMBULATORY_CARE_PROVIDER_SITE_OTHER): Payer: Self-pay | Admitting: Physician Assistant

## 2018-11-29 MED ORDER — DEXTROSE 5 % IV SOLN
3.0000 g | INTRAVENOUS | Status: AC
  Administered 2018-11-30: 3 g via INTRAVENOUS
  Filled 2018-11-29: qty 3

## 2018-11-29 NOTE — Progress Notes (Signed)
Kristopher Simon denies chest pain or shortness of breath. Patient has type II diabetes, he reports that CBGs run around 200.. I instructed patient to take 38 units of Tresiba at hs tonight and if CBG > 70 to take 38 units in am. I instructed patient to check CBG after awaking and every 2 hours until arrival  to the hospital.  I Instructed patient if CBG is less than 70 to drink 1/2 cup of a clear juice. Recheck CBG in 15 minutes then call pre- op desk at [phone] for further instructions. If scheduled to receive Insulin, do not take Insulin.  Patient denies that he nor his family or friend who is bring him, has experienced any of the following: Cough Fever >100.4 Runny Nose Sore Throat Difficulty breathing/ shortness of breath Travel in past 14 days- none

## 2018-11-30 ENCOUNTER — Other Ambulatory Visit: Payer: Self-pay

## 2018-11-30 ENCOUNTER — Inpatient Hospital Stay (HOSPITAL_COMMUNITY): Payer: Medicaid Other | Admitting: Anesthesiology

## 2018-11-30 ENCOUNTER — Encounter (HOSPITAL_COMMUNITY): Payer: Self-pay | Admitting: *Deleted

## 2018-11-30 ENCOUNTER — Inpatient Hospital Stay (HOSPITAL_COMMUNITY)
Admission: RE | Admit: 2018-11-30 | Discharge: 2018-12-04 | DRG: 857 | Disposition: A | Discharge status: Home Health | Payer: Medicaid Other | Attending: Orthopedic Surgery | Admitting: Orthopedic Surgery

## 2018-11-30 ENCOUNTER — Encounter (HOSPITAL_COMMUNITY)
Admission: RE | Disposition: A | Discharge status: Home Health | Payer: Self-pay | Source: Home / Self Care | Attending: Orthopedic Surgery

## 2018-11-30 DIAGNOSIS — E1122 Type 2 diabetes mellitus with diabetic chronic kidney disease: Secondary | ICD-10-CM | POA: Diagnosis present

## 2018-11-30 DIAGNOSIS — B961 Klebsiella pneumoniae [K. pneumoniae] as the cause of diseases classified elsewhere: Secondary | ICD-10-CM | POA: Diagnosis present

## 2018-11-30 DIAGNOSIS — F419 Anxiety disorder, unspecified: Secondary | ICD-10-CM | POA: Diagnosis present

## 2018-11-30 DIAGNOSIS — N189 Chronic kidney disease, unspecified: Secondary | ICD-10-CM | POA: Diagnosis present

## 2018-11-30 DIAGNOSIS — T8149XA Infection following a procedure, other surgical site, initial encounter: Principal | ICD-10-CM | POA: Diagnosis present

## 2018-11-30 DIAGNOSIS — L02416 Cutaneous abscess of left lower limb: Secondary | ICD-10-CM | POA: Diagnosis present

## 2018-11-30 DIAGNOSIS — T8781 Dehiscence of amputation stump: Secondary | ICD-10-CM | POA: Diagnosis not present

## 2018-11-30 DIAGNOSIS — I2510 Atherosclerotic heart disease of native coronary artery without angina pectoris: Secondary | ICD-10-CM | POA: Diagnosis present

## 2018-11-30 DIAGNOSIS — I129 Hypertensive chronic kidney disease with stage 1 through stage 4 chronic kidney disease, or unspecified chronic kidney disease: Secondary | ICD-10-CM | POA: Diagnosis present

## 2018-11-30 DIAGNOSIS — Z89512 Acquired absence of left leg below knee: Secondary | ICD-10-CM | POA: Diagnosis not present

## 2018-11-30 DIAGNOSIS — M797 Fibromyalgia: Secondary | ICD-10-CM | POA: Diagnosis present

## 2018-11-30 DIAGNOSIS — Z888 Allergy status to other drugs, medicaments and biological substances status: Secondary | ICD-10-CM | POA: Diagnosis not present

## 2018-11-30 DIAGNOSIS — Z86718 Personal history of other venous thrombosis and embolism: Secondary | ICD-10-CM | POA: Diagnosis not present

## 2018-11-30 DIAGNOSIS — M1990 Unspecified osteoarthritis, unspecified site: Secondary | ICD-10-CM | POA: Diagnosis present

## 2018-11-30 DIAGNOSIS — Z8249 Family history of ischemic heart disease and other diseases of the circulatory system: Secondary | ICD-10-CM

## 2018-11-30 DIAGNOSIS — F329 Major depressive disorder, single episode, unspecified: Secondary | ICD-10-CM | POA: Diagnosis present

## 2018-11-30 DIAGNOSIS — K219 Gastro-esophageal reflux disease without esophagitis: Secondary | ICD-10-CM | POA: Diagnosis present

## 2018-11-30 DIAGNOSIS — L02818 Cutaneous abscess of other sites: Secondary | ICD-10-CM | POA: Diagnosis present

## 2018-11-30 DIAGNOSIS — Y838 Other surgical procedures as the cause of abnormal reaction of the patient, or of later complication, without mention of misadventure at the time of the procedure: Secondary | ICD-10-CM | POA: Diagnosis present

## 2018-11-30 DIAGNOSIS — Z882 Allergy status to sulfonamides status: Secondary | ICD-10-CM

## 2018-11-30 DIAGNOSIS — F40240 Claustrophobia: Secondary | ICD-10-CM | POA: Diagnosis present

## 2018-11-30 DIAGNOSIS — Z8673 Personal history of transient ischemic attack (TIA), and cerebral infarction without residual deficits: Secondary | ICD-10-CM | POA: Diagnosis not present

## 2018-11-30 DIAGNOSIS — F4310 Post-traumatic stress disorder, unspecified: Secondary | ICD-10-CM | POA: Diagnosis present

## 2018-11-30 DIAGNOSIS — R413 Other amnesia: Secondary | ICD-10-CM | POA: Diagnosis present

## 2018-11-30 HISTORY — PX: STUMP REVISION: SHX6102

## 2018-11-30 HISTORY — DX: Claustrophobia: F40.240

## 2018-11-30 LAB — BASIC METABOLIC PANEL
Anion gap: 12 (ref 5–15)
BUN: 15 mg/dL (ref 6–20)
CO2: 22 mmol/L (ref 22–32)
Calcium: 9.4 mg/dL (ref 8.9–10.3)
Chloride: 100 mmol/L (ref 98–111)
Creatinine, Ser: 1.03 mg/dL (ref 0.61–1.24)
GFR calc Af Amer: >60 mL/min (ref >60)
GFR calc non Af Amer: >60 mL/min (ref >60)
Glucose, Bld: 191 mg/dL — ABNORMAL HIGH (ref 70–99)
Potassium: 5.5 mmol/L — ABNORMAL HIGH (ref 3.5–5.1)
Sodium: 134 mmol/L — ABNORMAL LOW (ref 135–145)

## 2018-11-30 LAB — WOUND CULTURE
MICRO NUMBER:: 380422
SPECIMEN QUALITY:: ADEQUATE

## 2018-11-30 LAB — GLUCOSE, CAPILLARY
Glucose-Capillary: 199 mg/dL — ABNORMAL HIGH (ref 70–99)
Glucose-Capillary: 174 mg/dL — ABNORMAL HIGH (ref 70–99)
Glucose-Capillary: 185 mg/dL — ABNORMAL HIGH (ref 70–99)
Glucose-Capillary: 230 mg/dL — ABNORMAL HIGH (ref 70–99)
Glucose-Capillary: 292 mg/dL — ABNORMAL HIGH (ref 70–99)

## 2018-11-30 LAB — CBC
HCT: 39.5 % (ref 39.0–52.0)
HCT: 30.4 % — ABNORMAL LOW (ref 39.0–52.0)
Hemoglobin: 11.3 g/dL — ABNORMAL LOW (ref 13.0–17.0)
Hemoglobin: 9.2 g/dL — ABNORMAL LOW (ref 13.0–17.0)
MCH: 23.6 pg — ABNORMAL LOW (ref 26.0–34.0)
MCH: 24.1 pg — ABNORMAL LOW (ref 26.0–34.0)
MCHC: 28.6 g/dL — ABNORMAL LOW (ref 30.0–36.0)
MCHC: 30.3 g/dL (ref 30.0–36.0)
MCV: 82.5 fL (ref 80.0–100.0)
MCV: 79.8 fL — ABNORMAL LOW (ref 80.0–100.0)
Platelets: 465 10*3/uL — ABNORMAL HIGH (ref 150–400)
Platelets: 490 10*3/uL — ABNORMAL HIGH (ref 150–400)
RBC: 4.79 MIL/uL (ref 4.22–5.81)
RBC: 3.81 MIL/uL — ABNORMAL LOW (ref 4.22–5.81)
RDW: 16.9 % — ABNORMAL HIGH (ref 11.5–15.5)
RDW: 16.8 % — ABNORMAL HIGH (ref 11.5–15.5)
WBC: 12.1 10*3/uL — ABNORMAL HIGH (ref 4.0–10.5)
WBC: 19.8 10*3/uL — ABNORMAL HIGH (ref 4.0–10.5)
nRBC: 0 % (ref 0.0–0.2)
nRBC: 0 % (ref 0.0–0.2)

## 2018-11-30 SURGERY — REVISION, AMPUTATION SITE
Anesthesia: General | Site: Leg Lower | Laterality: Left

## 2018-11-30 MED ORDER — EPHEDRINE 5 MG/ML INJ
INTRAVENOUS | Status: AC
  Filled 2018-11-30: qty 10

## 2018-11-30 MED ORDER — SUCCINYLCHOLINE CHLORIDE 200 MG/10ML IV SOSY
PREFILLED_SYRINGE | INTRAVENOUS | Status: AC
  Filled 2018-11-30: qty 20

## 2018-11-30 MED ORDER — ONDANSETRON HCL 4 MG/2ML IJ SOLN
INTRAMUSCULAR | Status: DC | PRN
  Administered 2018-11-30: 4 mg via INTRAVENOUS

## 2018-11-30 MED ORDER — CITALOPRAM HYDROBROMIDE 20 MG PO TABS
30.0000 mg | ORAL_TABLET | Freq: Every day | ORAL | Status: DC
  Administered 2018-12-01 – 2018-12-04 (×4): 30 mg via ORAL
  Filled 2018-11-30 (×5): qty 2

## 2018-11-30 MED ORDER — VANCOMYCIN HCL 10 G IV SOLR
2500.0000 mg | INTRAVENOUS | Status: AC
  Administered 2018-11-30: 2500 mg via INTRAVENOUS
  Filled 2018-11-30: qty 2500

## 2018-11-30 MED ORDER — INSULIN DEGLUDEC 100 UNIT/ML ~~LOC~~ SOPN: 77.0000 [IU] | PEN_INJECTOR | Freq: Two times a day (BID) | SUBCUTANEOUS | Status: DC

## 2018-11-30 MED ORDER — OXYCODONE HCL 5 MG PO TABS
ORAL_TABLET | ORAL | Status: AC
  Filled 2018-11-30: qty 3

## 2018-11-30 MED ORDER — HYDRALAZINE HCL 25 MG PO TABS
25.0000 mg | ORAL_TABLET | Freq: Three times a day (TID) | ORAL | Status: DC
  Administered 2018-11-30 – 2018-12-04 (×12): 25 mg via ORAL
  Filled 2018-11-30 (×12): qty 1

## 2018-11-30 MED ORDER — FENTANYL CITRATE (PF) 100 MCG/2ML IJ SOLN
INTRAMUSCULAR | Status: AC
  Administered 2018-11-30: 100 ug via INTRAVENOUS
  Filled 2018-11-30: qty 2

## 2018-11-30 MED ORDER — PRAVASTATIN SODIUM 10 MG PO TABS
20.0000 mg | ORAL_TABLET | Freq: Every day | ORAL | Status: DC
  Administered 2018-11-30 – 2018-12-03 (×4): 20 mg via ORAL
  Filled 2018-11-30 (×4): qty 2

## 2018-11-30 MED ORDER — HYDROMORPHONE HCL 1 MG/ML IJ SOLN
INTRAMUSCULAR | Status: AC
  Filled 2018-11-30: qty 1

## 2018-11-30 MED ORDER — HYDROMORPHONE HCL 1 MG/ML IJ SOLN
0.2500 mg | INTRAMUSCULAR | Status: DC | PRN
  Administered 2018-11-30 (×4): 0.5 mg via INTRAVENOUS

## 2018-11-30 MED ORDER — DEXAMETHASONE SODIUM PHOSPHATE 4 MG/ML IJ SOLN
INTRAMUSCULAR | Status: DC | PRN
  Administered 2018-11-30: 5 mg via INTRAVENOUS

## 2018-11-30 MED ORDER — PROPOFOL 10 MG/ML IV BOLUS
INTRAVENOUS | Status: AC
  Filled 2018-11-30: qty 20

## 2018-11-30 MED ORDER — MIDAZOLAM HCL 2 MG/2ML IJ SOLN
2.0000 mg | Freq: Once | INTRAMUSCULAR | Status: AC
  Administered 2018-11-30: 2 mg via INTRAVENOUS

## 2018-11-30 MED ORDER — 0.9 % SODIUM CHLORIDE (POUR BTL) OPTIME
TOPICAL | Status: DC | PRN
  Administered 2018-11-30: 1000 mL

## 2018-11-30 MED ORDER — MEPERIDINE HCL 50 MG/ML IJ SOLN: 6.2500 mg | INTRAMUSCULAR | Status: DC | PRN

## 2018-11-30 MED ORDER — LIDOCAINE 2% (20 MG/ML) 5 ML SYRINGE
INTRAMUSCULAR | Status: AC
  Filled 2018-11-30: qty 10

## 2018-11-30 MED ORDER — MIDAZOLAM HCL 2 MG/2ML IJ SOLN
INTRAMUSCULAR | Status: AC
  Filled 2018-11-30: qty 2

## 2018-11-30 MED ORDER — FENTANYL CITRATE (PF) 100 MCG/2ML IJ SOLN
INTRAMUSCULAR | Status: DC | PRN
  Administered 2018-11-30: 50 ug via INTRAVENOUS
  Administered 2018-11-30: 150 ug via INTRAVENOUS

## 2018-11-30 MED ORDER — PROMETHAZINE HCL 25 MG/ML IJ SOLN: 6.2500 mg | INTRAMUSCULAR | Status: DC | PRN

## 2018-11-30 MED ORDER — SUGAMMADEX SODIUM 500 MG/5ML IV SOLN
INTRAVENOUS | Status: DC | PRN
  Administered 2018-11-30: 500 mg via INTRAVENOUS

## 2018-11-30 MED ORDER — METOCLOPRAMIDE HCL 5 MG/ML IJ SOLN: 5.0000 mg | Freq: Three times a day (TID) | INTRAMUSCULAR | Status: DC | PRN

## 2018-11-30 MED ORDER — PHENYLEPHRINE 40 MCG/ML (10ML) SYRINGE FOR IV PUSH (FOR BLOOD PRESSURE SUPPORT)
PREFILLED_SYRINGE | INTRAVENOUS | Status: AC
  Filled 2018-11-30: qty 10

## 2018-11-30 MED ORDER — CYCLOBENZAPRINE HCL 5 MG PO TABS
5.0000 mg | ORAL_TABLET | Freq: Three times a day (TID) | ORAL | Status: DC
  Administered 2018-11-30 – 2018-12-04 (×11): 5 mg via ORAL
  Filled 2018-11-30 (×11): qty 1

## 2018-11-30 MED ORDER — LIDOCAINE HCL (CARDIAC) PF 100 MG/5ML IV SOSY
PREFILLED_SYRINGE | INTRAVENOUS | Status: DC | PRN
  Administered 2018-11-30: 100 mg via INTRAVENOUS

## 2018-11-30 MED ORDER — TRANEXAMIC ACID 1000 MG/10ML IV SOLN
INTRAVENOUS | Status: DC | PRN
  Administered 2018-11-30: 1000 mg via INTRAVENOUS

## 2018-11-30 MED ORDER — CHLORHEXIDINE GLUCONATE 4 % EX LIQD: 60.0000 mL | Freq: Once | CUTANEOUS | Status: DC

## 2018-11-30 MED ORDER — PANTOPRAZOLE SODIUM 40 MG PO TBEC
40.0000 mg | DELAYED_RELEASE_TABLET | Freq: Every day | ORAL | Status: DC
  Administered 2018-12-01 – 2018-12-04 (×4): 40 mg via ORAL
  Filled 2018-11-30 (×4): qty 1

## 2018-11-30 MED ORDER — METOCLOPRAMIDE HCL 5 MG PO TABS: 5.0000 mg | ORAL_TABLET | Freq: Three times a day (TID) | ORAL | Status: DC | PRN

## 2018-11-30 MED ORDER — GABAPENTIN 300 MG PO CAPS
300.0000 mg | ORAL_CAPSULE | Freq: Three times a day (TID) | ORAL | Status: DC
  Administered 2018-11-30 – 2018-12-04 (×12): 300 mg via ORAL
  Filled 2018-11-30 (×12): qty 1

## 2018-11-30 MED ORDER — ROCURONIUM BROMIDE 100 MG/10ML IV SOLN
INTRAVENOUS | Status: DC | PRN
  Administered 2018-11-30: 30 mg via INTRAVENOUS

## 2018-11-30 MED ORDER — HYDROXYZINE HCL 10 MG PO TABS
10.0000 mg | ORAL_TABLET | Freq: Three times a day (TID) | ORAL | Status: DC
  Administered 2018-11-30 – 2018-12-04 (×11): 10 mg via ORAL
  Filled 2018-11-30 (×13): qty 1

## 2018-11-30 MED ORDER — KETOROLAC TROMETHAMINE 30 MG/ML IJ SOLN
INTRAMUSCULAR | Status: AC
  Filled 2018-11-30: qty 1

## 2018-11-30 MED ORDER — MIDAZOLAM HCL 5 MG/5ML IJ SOLN
INTRAMUSCULAR | Status: DC | PRN
  Administered 2018-11-30: 2 mg via INTRAVENOUS

## 2018-11-30 MED ORDER — ALPRAZOLAM 0.5 MG PO TABS
1.0000 mg | ORAL_TABLET | Freq: Four times a day (QID) | ORAL | Status: DC | PRN
  Administered 2018-11-30 – 2018-12-04 (×7): 1 mg via ORAL
  Filled 2018-11-30 (×7): qty 2

## 2018-11-30 MED ORDER — DEXAMETHASONE SODIUM PHOSPHATE 10 MG/ML IJ SOLN
INTRAMUSCULAR | Status: AC
  Filled 2018-11-30: qty 1

## 2018-11-30 MED ORDER — OXYCODONE HCL 5 MG PO TABS
10.0000 mg | ORAL_TABLET | ORAL | Status: DC | PRN
  Administered 2018-11-30 – 2018-12-01 (×6): 15 mg via ORAL
  Administered 2018-12-02: 10 mg via ORAL
  Administered 2018-12-02: 15 mg via ORAL
  Administered 2018-12-02: 10 mg via ORAL
  Administered 2018-12-02 – 2018-12-04 (×7): 15 mg via ORAL
  Filled 2018-11-30 (×13): qty 3

## 2018-11-30 MED ORDER — VANCOMYCIN HCL 10 G IV SOLR
2000.0000 mg | Freq: Two times a day (BID) | INTRAVENOUS | Status: DC
  Administered 2018-12-01 – 2018-12-02 (×3): 2000 mg via INTRAVENOUS
  Filled 2018-11-30 (×5): qty 2000

## 2018-11-30 MED ORDER — INSULIN DETEMIR 100 UNIT/ML ~~LOC~~ SOLN
77.0000 [IU] | Freq: Two times a day (BID) | SUBCUTANEOUS | Status: DC
  Administered 2018-11-30 – 2018-12-04 (×8): 77 [IU] via SUBCUTANEOUS
  Filled 2018-11-30 (×10): qty 0.77

## 2018-11-30 MED ORDER — MIDAZOLAM HCL 2 MG/2ML IJ SOLN
INTRAMUSCULAR | Status: AC
  Administered 2018-11-30: 2 mg via INTRAVENOUS
  Filled 2018-11-30: qty 2

## 2018-11-30 MED ORDER — DOCUSATE SODIUM 100 MG PO CAPS
100.0000 mg | ORAL_CAPSULE | Freq: Two times a day (BID) | ORAL | Status: DC
  Administered 2018-11-30 – 2018-12-04 (×8): 100 mg via ORAL
  Filled 2018-11-30 (×8): qty 1

## 2018-11-30 MED ORDER — ALBUMIN HUMAN 5 % IV SOLN
INTRAVENOUS | Status: DC | PRN
  Administered 2018-11-30: 15:00:00 via INTRAVENOUS

## 2018-11-30 MED ORDER — SUCCINYLCHOLINE CHLORIDE 20 MG/ML IJ SOLN
INTRAMUSCULAR | Status: DC | PRN
  Administered 2018-11-30: 200 mg via INTRAVENOUS

## 2018-11-30 MED ORDER — LIRAGLUTIDE 18 MG/3ML ~~LOC~~ SOPN: 1.8000 mg | PEN_INJECTOR | Freq: Every day | SUBCUTANEOUS | Status: DC

## 2018-11-30 MED ORDER — ACETAMINOPHEN 500 MG PO TABS
1000.0000 mg | ORAL_TABLET | Freq: Four times a day (QID) | ORAL | Status: AC
  Administered 2018-11-30 – 2018-12-01 (×4): 1000 mg via ORAL
  Filled 2018-11-30 (×4): qty 2

## 2018-11-30 MED ORDER — ACETAMINOPHEN 325 MG PO TABS: 325.0000 mg | ORAL_TABLET | Freq: Four times a day (QID) | ORAL | Status: DC | PRN

## 2018-11-30 MED ORDER — TAPENTADOL HCL 50 MG PO TABS
100.0000 mg | ORAL_TABLET | Freq: Four times a day (QID) | ORAL | Status: DC
  Administered 2018-11-30 – 2018-12-04 (×15): 100 mg via ORAL
  Filled 2018-11-30 (×15): qty 2

## 2018-11-30 MED ORDER — METOPROLOL TARTRATE 25 MG PO TABS
75.0000 mg | ORAL_TABLET | Freq: Two times a day (BID) | ORAL | Status: DC
  Administered 2018-11-30 – 2018-12-04 (×8): 75 mg via ORAL
  Filled 2018-11-30 (×8): qty 3

## 2018-11-30 MED ORDER — ROPIVACAINE HCL 7.5 MG/ML IJ SOLN
INTRAMUSCULAR | Status: DC | PRN
  Administered 2018-11-30 (×8): 5 mL via PERINEURAL

## 2018-11-30 MED ORDER — PROPOFOL 10 MG/ML IV BOLUS
INTRAVENOUS | Status: DC | PRN
  Administered 2018-11-30: 50 mg via INTRAVENOUS
  Administered 2018-11-30: 200 mg via INTRAVENOUS

## 2018-11-30 MED ORDER — HYDROMORPHONE HCL 1 MG/ML IJ SOLN
0.5000 mg | INTRAMUSCULAR | Status: DC | PRN
  Administered 2018-11-30 – 2018-12-03 (×5): 1 mg via INTRAVENOUS
  Filled 2018-11-30 (×5): qty 1

## 2018-11-30 MED ORDER — FENTANYL CITRATE (PF) 250 MCG/5ML IJ SOLN
INTRAMUSCULAR | Status: AC
  Filled 2018-11-30: qty 5

## 2018-11-30 MED ORDER — OXYCODONE HCL 5 MG PO TABS
5.0000 mg | ORAL_TABLET | ORAL | Status: DC | PRN
  Filled 2018-11-30 (×2): qty 2

## 2018-11-30 MED ORDER — KETOROLAC TROMETHAMINE 30 MG/ML IJ SOLN
30.0000 mg | Freq: Once | INTRAMUSCULAR | Status: AC | PRN
  Administered 2018-11-30: 30 mg via INTRAVENOUS

## 2018-11-30 MED ORDER — ONDANSETRON HCL 4 MG/2ML IJ SOLN: 4.0000 mg | Freq: Four times a day (QID) | INTRAMUSCULAR | Status: DC | PRN

## 2018-11-30 MED ORDER — TRANEXAMIC ACID-NACL 1000-0.7 MG/100ML-% IV SOLN
INTRAVENOUS | Status: AC
  Filled 2018-11-30: qty 100

## 2018-11-30 MED ORDER — FENTANYL CITRATE (PF) 100 MCG/2ML IJ SOLN
100.0000 ug | Freq: Once | INTRAMUSCULAR | Status: AC
  Administered 2018-11-30: 100 ug via INTRAVENOUS

## 2018-11-30 MED ORDER — PIPERACILLIN-TAZOBACTAM 3.375 G IVPB
3.3750 g | Freq: Three times a day (TID) | INTRAVENOUS | Status: DC
  Administered 2018-11-30 – 2018-12-02 (×6): 3.375 g via INTRAVENOUS
  Filled 2018-11-30 (×6): qty 50

## 2018-11-30 MED ORDER — LACTATED RINGERS IV SOLN
INTRAVENOUS | Status: DC
  Administered 2018-11-30 (×2): via INTRAVENOUS

## 2018-11-30 MED ORDER — AMLODIPINE BESYLATE 5 MG PO TABS
5.0000 mg | ORAL_TABLET | Freq: Every day | ORAL | Status: DC
  Administered 2018-12-01 – 2018-12-04 (×4): 5 mg via ORAL
  Filled 2018-11-30 (×4): qty 1

## 2018-11-30 MED ORDER — GEMFIBROZIL 600 MG PO TABS
600.0000 mg | ORAL_TABLET | Freq: Two times a day (BID) | ORAL | Status: DC
  Administered 2018-12-01 – 2018-12-04 (×7): 600 mg via ORAL
  Filled 2018-11-30 (×10): qty 1

## 2018-11-30 MED ORDER — INSULIN ASPART 100 UNIT/ML ~~LOC~~ SOLN
20.0000 [IU] | Freq: Three times a day (TID) | SUBCUTANEOUS | Status: DC
  Administered 2018-11-30 – 2018-12-01 (×3): 20 [IU] via SUBCUTANEOUS

## 2018-11-30 MED ORDER — ONDANSETRON HCL 4 MG PO TABS: 4.0000 mg | ORAL_TABLET | Freq: Four times a day (QID) | ORAL | Status: DC | PRN

## 2018-11-30 MED ORDER — PHENYLEPHRINE HCL (PRESSORS) 10 MG/ML IV SOLN
INTRAVENOUS | Status: DC | PRN
  Administered 2018-11-30: 80 ug via INTRAVENOUS

## 2018-11-30 MED ORDER — ONDANSETRON HCL 4 MG/2ML IJ SOLN
INTRAMUSCULAR | Status: AC
  Filled 2018-11-30: qty 2

## 2018-11-30 SURGICAL SUPPLY — 35 items
BLADE SAW RECIP 87.9 MT (BLADE) ×2 IMPLANT
BLADE SURG 21 STRL SS (BLADE) ×3 IMPLANT
BNDG COHESIVE 6X5 TAN STRL LF (GAUZE/BANDAGES/DRESSINGS) ×2 IMPLANT
BNDG GAUZE ELAST 4 BULKY (GAUZE/BANDAGES/DRESSINGS) ×2 IMPLANT
CANISTER WOUND CARE 500ML ATS (WOUND CARE) ×3 IMPLANT
COVER SURGICAL LIGHT HANDLE (MISCELLANEOUS) ×3 IMPLANT
COVER WAND RF STERILE (DRAPES) ×3 IMPLANT
DRAPE EXTREMITY T 121X128X90 (DISPOSABLE) ×3 IMPLANT
DRAPE HALF SHEET 40X57 (DRAPES) ×3 IMPLANT
DRAPE INCISE IOBAN 66X45 STRL (DRAPES) ×5 IMPLANT
DRAPE U-SHAPE 47X51 STRL (DRAPES) ×6 IMPLANT
DRESSING PREVENA PLUS CUSTOM (GAUZE/BANDAGES/DRESSINGS) ×1 IMPLANT
DRSG PREVENA PLUS CUSTOM (GAUZE/BANDAGES/DRESSINGS) ×3
DURAPREP 26ML APPLICATOR (WOUND CARE) ×3 IMPLANT
ELECT REM PT RETURN 9FT ADLT (ELECTROSURGICAL) ×3
ELECTRODE REM PT RTRN 9FT ADLT (ELECTROSURGICAL) ×1 IMPLANT
GLOVE BIOGEL PI IND STRL 9 (GLOVE) ×1 IMPLANT
GLOVE BIOGEL PI INDICATOR 9 (GLOVE) ×2
GLOVE SURG ORTHO 9.0 STRL STRW (GLOVE) ×3 IMPLANT
GOWN STRL REUS W/ TWL XL LVL3 (GOWN DISPOSABLE) ×2 IMPLANT
GOWN STRL REUS W/TWL XL LVL3 (GOWN DISPOSABLE) ×4
KIT BASIN OR (CUSTOM PROCEDURE TRAY) ×3 IMPLANT
KIT TURNOVER KIT B (KITS) ×3 IMPLANT
MANIFOLD NEPTUNE II (INSTRUMENTS) ×3 IMPLANT
NS IRRIG 1000ML POUR BTL (IV SOLUTION) ×3 IMPLANT
PACK GENERAL/GYN (CUSTOM PROCEDURE TRAY) ×3 IMPLANT
PAD ARMBOARD 7.5X6 YLW CONV (MISCELLANEOUS) ×3 IMPLANT
PREVENA RESTOR ARTHOFORM 33X30 (CANNISTER) ×2 IMPLANT
PREVENA RESTOR ARTHOFORM 46X30 (CANNISTER) ×3 IMPLANT
SPONGE LAP 18X18 RF (DISPOSABLE) ×2 IMPLANT
STAPLER VISISTAT 35W (STAPLE) IMPLANT
SUT ETHILON 2 0 PSLX (SUTURE) ×8 IMPLANT
SUT SILK 2 0 (SUTURE) ×2
SUT SILK 2-0 18XBRD TIE 12 (SUTURE) IMPLANT
TOWEL OR 17X26 10 PK STRL BLUE (TOWEL DISPOSABLE) ×3 IMPLANT

## 2018-11-30 NOTE — Progress Notes (Signed)
Pharmacy Antibiotic Note  Kristopher Simon is a 53 y.o. male admitted on 11/30/2018 with R BKA wound infection.  Pharmacy has been consulted for Vancomycin and Zosyn dosing. Pt went to OR today for revision of R BKA due to abscess and conservative measures failed.  Ancef given pre-op  Plan: Zosyn 3.375gm IV q8h - each dose over 4 hours Vancomycin 2500mg  IV now then 2000 mg IV Q 12 hrs. Goal AUC 400-550. Expected AUC: 502 SCr used: 1.03 Will f/u renal function, micro data, and pt's clinical condition Vanc levels prn   Height: 6\' 5"  (195.6 cm) Weight: (!) 381 lb (172.8 kg) IBW/kg (Calculated) : 89.1  Temp (24hrs), Avg:98.3 F (36.8 C), Min:97.8 F (36.6 C), Max:99.3 F (37.4 C)  Recent Labs  Lab 11/30/18 1148  WBC 12.1*  CREATININE 1.03    Estimated Creatinine Clearance: 145.5 mL/min (by C-G formula based on SCr of 1.03 mg/dL).    Allergies  Allergen Reactions  . Sulfamethoxazole-Trimethoprim Anaphylaxis and Swelling     (BACTRIM) tongue swells  . Pregabalin Swelling    SWELLING REACTION UNSPECIFIED     Antimicrobials this admission: 4/10 Ancef x 1 4/10 Vanc >>  4/10 Zosyn >>   Microbiology results: 4/10 Abscess cx:  Thank you for allowing pharmacy to be a part of this patient's care.  Sherlon Handing, PharmD, BCPS Clinical pharmacist  **Pharmacist phone directory can now be found on [email or web address] ([email or web address]).  Listed under [place]. 11/30/2018 5:40 PM

## 2018-11-30 NOTE — Op Note (Signed)
11/30/2018  3:46 PM  PATIENT:  Kristopher Simon    PRE-OPERATIVE DIAGNOSIS:  Abscess Left Below Knee Amputation  POST-OPERATIVE DIAGNOSIS:  Same  PROCEDURE:  REVISION LEFT BELOW KNEE AMPUTATION  SURGEON:  Newt Minion, MD  PHYSICIAN ASSISTANT:None ANESTHESIA:   General  PREOPERATIVE INDICATIONS:  Kristopher Simon is a  53 y.o. male with a diagnosis of Abscess Left Below Knee Amputation who failed conservative measures and elected for surgical management.    The risks benefits and alternatives were discussed with the patient preoperatively including but not limited to the risks of infection, bleeding, nerve injury, cardiopulmonary complications, the need for revision surgery, among others, and the patient was willing to proceed.  OPERATIVE IMPLANTS: Praveena wound VAC customizable and restore  @ENCIMAGES @  OPERATIVE FINDINGS: Deep abscess tissue sent for cultures will start vancomycin and Zosyn.  Patient had oozing from all tissue secondary to his aspirin and Plavix.  Both are on hold at this time.  OPERATIVE PROCEDURE: Patient was brought the operating room underwent a general anesthetic.  After adequate levels anesthesia were obtained patient's left lower extremity was prepped using ChloraPrep draped into a sterile field a timeout was called.  A incision was made to incorporate the wound.  There was a deep abscess and this tissue was sent for cultures.  All involved tissue was resected.  The distal centimeter of the tibia was resected.  The anterior tibial vessels were suture ligated with 2-0 silk.  There was oozing from all surfaces.  The wound was irrigated with normal saline hemostasis was obtained.  The deep and superficial fascia layers were closed using 2-0 nylon.  I restore and customizable wound VAC was applied this had a good suction fit this was covered with Coban.  Patient was extubated taken to PACU in stable condition.   DISCHARGE PLANNING:  Antibiotic duration: Start IV  antibiotics transition to orals once cultures are finalized  Weightbearing: Nonweightbearing on the left  Pain medication: Opioid pathway  Dressing care/ Wound VAC: Continue wound VAC for 1 week  Ambulatory devices: Walker  Discharge to: Anticipate discharge to home.  Follow-up: In the office 1 week post operative.

## 2018-11-30 NOTE — Anesthesia Postprocedure Evaluation (Signed)
Anesthesia Post Note  Patient: Kristopher Simon  Procedure(s) Performed: REVISION LEFT BELOW KNEE AMPUTATION (Left Leg Lower)     Patient location during evaluation: PACU Anesthesia Type: General Level of consciousness: awake and sedated Pain management: pain level controlled Vital Signs Assessment: post-procedure vital signs reviewed and stable Respiratory status: spontaneous breathing Cardiovascular status: stable Postop Assessment: no apparent nausea or vomiting Anesthetic complications: no    Last Vitals:  Vitals:   11/30/18 1630 11/30/18 1645  BP: 97/60 108/70  Pulse: 92 92  Resp: 11 15  Temp:    SpO2: 94% 96%    Last Pain:  Vitals:   11/30/18 1646  TempSrc:   PainSc: 6    Pain Goal:    LLE Motor Response: Purposeful movement, Responds to commands (11/30/18 1630) LLE Sensation: Full sensation (11/30/18 1630)            Huston Foley

## 2018-11-30 NOTE — Anesthesia Procedure Notes (Addendum)
Anesthesia Regional Block: Popliteal block   Pre-Anesthetic Checklist: ,, timeout performed, Correct Patient, Correct Site, Correct Laterality, Correct Procedure, Correct Position, site marked, Risks and benefits discussed,  Surgical consent,  Pre-op evaluation,  At surgeon's request and post-op pain management  Laterality: Lower and Left  Prep: chloraprep       Needles:  Injection technique: Single-shot  Needle Type: Echogenic Stimulator Needle     Needle Length: 10cm  Needle Gauge: 21   Needle insertion depth: 2.5 cm   Additional Needles:   Procedures:,,,, ultrasound used (permanent image in chart),,,,  Narrative:  Start time: 11/30/2018 2:35 PM End time: 11/30/2018 2:43 PM Injection made incrementally with aspirations every 5 mL.  Performed by: Personally  Anesthesiologist: Lyn Hollingshead, MD       L side blocks as mentioned in the block notes

## 2018-11-30 NOTE — Progress Notes (Signed)
1740 Received pt from PACU, A&O x4. Left BKA dressing dry an intact with wound vac on.

## 2018-11-30 NOTE — Anesthesia Procedure Notes (Signed)
Anesthesia Regional Block: Adductor canal block   Pre-Anesthetic Checklist: ,, timeout performed, Correct Patient, Correct Site, Correct Laterality, Correct Procedure, Correct Position, site marked, Risks and benefits discussed,  Surgical consent,  Pre-op evaluation,  At surgeon's request and post-op pain management  Laterality: Lower  Prep: chloraprep       Needles:  Injection technique: Single-shot  Needle Type: Echogenic Stimulator Needle     Needle Length: 10cm  Needle Gauge: 21   Needle insertion depth: 5 cm   Additional Needles:   Procedures:,,,, ultrasound used (permanent image in chart),,,,  Narrative:  Start time: 11/30/2018 2:25 PM End time: 11/30/2018 2:35 PM Injection made incrementally with aspirations every 5 mL.  Performed by: Personally  Anesthesiologist: Lyn Hollingshead, MD       The blocks are L as noted in both block notes

## 2018-11-30 NOTE — H&P (Signed)
Kristopher Simon is an 53 y.o. male.   Chief Complaint: Abscess left transtibial amputation site HPI: The patient is a 53 year old gentleman who is approximately 5 weeks status post revision of the left transtibial amputation site when he noted acute redness and drainage from his amputation site.  He appears to have abscess which probes to the bone. Cultures were obtained in the office on 11/27/2018 and are still pending.  The patient presents for abscess drainage and revision of his left transtibial amputation site.  Past Medical History:  Diagnosis Date  . Acute renal failure ([place]) 01/2012   CKD  . Anxiety   . Arthritis   . Cellulitis   . Cellulitis of left lower extremity 07/06/2014  . Claustrophobia   . Concussion   . Coronary artery disease   . Depression   . Diabetes mellitus 1995   Type II  . DVT (deep venous thrombosis) ([place]) 2014   left leg  . Fibromyalgia   . Foot abscess, left 09/12/2018  . GERD (gastroesophageal reflux disease)   . Hypertension 1998  . Neuropathy   . PTSD (post-traumatic stress disorder)   . Stroke ([place])    x 3  memory loss - left hand- and left lower extremity-shakes at times- closes by it self- 06/2017- last one    Past Surgical History:  Procedure Laterality Date  . AMPUTATION Left 09/14/2018   Procedure: LEFT BELOW KNEE AMPUTATION;  Surgeon: Newt Minion, MD;  Location: [facility];  Service: Orthopedics;  Laterality: Left;  . CARDIAC CATHETERIZATION  2009   stent  . COLONOSCOPY    . HIP FRACTURE SURGERY Right 2014  . STUMP REVISION Left 10/19/2018  . STUMP REVISION Left 10/19/2018   Procedure: REVISION LEFT BELOW KNEE AMPUTATION;  Surgeon: Newt Minion, MD;  Location: [facility];  Service: Orthopedics;  Laterality: Left;  . TONSILLECTOMY      Family History  Problem Relation Age of Onset  . Hypertension Mother   . Dementia Father   . Alzheimer's disease Father    Social History:  reports that he has never smoked. He has never used smokeless tobacco.  He reports previous alcohol use of about 1.0 standard drinks of alcohol per week. He reports that he does not use drugs.  Allergies:  Allergies  Allergen Reactions  . Sulfamethoxazole-Trimethoprim Anaphylaxis and Swelling     (BACTRIM) tongue swells  . Pregabalin Swelling    SWELLING REACTION UNSPECIFIED     No medications prior to admission.    No results found for this or any previous visit (from the past 48 hour(s)). No results found.  Review of Systems  All other systems reviewed and are negative.   There were no vitals taken for this visit. Physical Exam  Constitutional: He is oriented to person, place, and time. He appears well-developed and well-nourished. No distress.  HENT:  Head: Normocephalic and atraumatic.  Neck: No tracheal deviation present. No thyromegaly present.  Cardiovascular: Normal rate.  Respiratory: Effort normal. No stridor. No respiratory distress.  GI: Soft. He exhibits no distension.  Musculoskeletal:     Comments: Examination patient has redness induration around the residual limb.  There is a very small wound that is about 5 mm in diameter with manipulation a large hematoma was drained that has not unclear draining hematoma most likely consistent with an abscess.  Deep cultures were obtained.  Neurological: He is alert and oriented to person, place, and time. No cranial nerve deficit. He exhibits normal muscle tone.  Skin: Skin is warm.  Psychiatric: He has a normal mood and affect. His behavior is normal. Thought content normal.     Assessment/Plan Abscess left transtibial amputation site-plan revision left transtibial amputation with abscess drainage.  The procedure and risk and benefits were discussed including the risk of bleeding, infection, neurovascular injury, possible need for further surgery were discussed with the patient and his questions were answered to his satisfaction.  The patient wishes to proceed at this time.  Erlinda Hong,  PA-C 11/30/2018, 7:39 AM [facility] [phone]

## 2018-11-30 NOTE — Transfer of Care (Signed)
Immediate Anesthesia Transfer of Care Note  Patient: Kristopher Simon  Procedure(s) Performed: REVISION LEFT BELOW KNEE AMPUTATION (Left Leg Lower)  Patient Location: PACU  Anesthesia Type:General /Regional   Level of Consciousness: awake, alert , oriented and patient cooperative  Airway & Oxygen Therapy: Patient Spontanous Breathing  Post-op Assessment: Report given to RN and Post -op Vital signs reviewed and stable  Post vital signs: Reviewed and stable  Last Vitals:  Vitals Value Taken Time  BP    Temp    Pulse 99 11/30/2018  3:59 PM  Resp 10 11/30/2018  3:59 PM  SpO2 96 % 11/30/2018  3:59 PM  Vitals shown include unvalidated device data.  Last Pain:  Vitals:   11/30/18 1210  TempSrc:   PainSc: 0-No pain         Complications: No apparent anesthesia complications

## 2018-11-30 NOTE — Anesthesia Preprocedure Evaluation (Signed)
Anesthesia Evaluation  Patient identified by MRN, date of birth, ID band Patient awake    Reviewed: Allergy & Precautions, H&P , NPO status , Patient's Chart, lab work & pertinent test results  Airway Mallampati: II   Neck ROM: full    Dental no notable dental hx. (+) Teeth Intact   Pulmonary shortness of breath,    Pulmonary exam normal breath sounds clear to auscultation       Cardiovascular hypertension, Pt. on medications and Pt. on home beta blockers  Rhythm:regular Rate:Normal  TTE (08/2018): EF 55%, normal valves   Neuro/Psych PSYCHIATRIC DISORDERS Anxiety Depression  Neuromuscular disease CVA    GI/Hepatic GERD  Medicated and Controlled,  Endo/Other  diabetes, Type 2, Insulin DependentMorbid obesity  Renal/GU Renal InsufficiencyRenal disease     Musculoskeletal  (+) Arthritis , Fibromyalgia -  Abdominal (+) + obese,   Peds  Hematology   Anesthesia Other Findings   Reproductive/Obstetrics                             Anesthesia Physical  Anesthesia Plan  ASA: III  Anesthesia Plan: General   Post-op Pain Management:    Induction: Intravenous  PONV Risk Score and Plan: 2 and Ondansetron, Dexamethasone, Midazolam and Treatment may vary due to age or medical condition  Airway Management Planned: Oral ETT  Additional Equipment:   Intra-op Plan:   Post-operative Plan: Extubation in OR  Informed Consent: I have reviewed the patients History and Physical, chart, labs and discussed the procedure including the risks, benefits and alternatives for the proposed anesthesia with the patient or authorized representative who has indicated his/her understanding and acceptance.       Plan Discussed with: CRNA  Anesthesia Plan Comments:         Anesthesia Quick Evaluation

## 2018-11-30 NOTE — Anesthesia Procedure Notes (Signed)
Procedure Name: Intubation Date/Time: 11/30/2018 2:59 PM Performed by: Oletta Lamas, CRNA Pre-anesthesia Checklist: Patient identified, Emergency Drugs available, Suction available and Patient being monitored Patient Re-evaluated:Patient Re-evaluated prior to induction Oxygen Delivery Method: Circle System Utilized Preoxygenation: Pre-oxygenation with 100% oxygen Induction Type: IV induction and Cricoid Pressure applied Ventilation: Mask ventilation without difficulty and Oral airway inserted - appropriate to patient size Laryngoscope Size: Mac and 4 Grade View: Grade II Tube type: Oral Tube size: 7.5 mm Number of attempts: 1 Airway Equipment and Method: Stylet and Oral airway Placement Confirmation: ETT inserted through vocal cords under direct vision,  positive ETCO2 and breath sounds checked- equal and bilateral Secured at: 23 cm Tube secured with: Tape Dental Injury: Teeth and Oropharynx as per pre-operative assessment

## 2018-12-01 ENCOUNTER — Encounter (HOSPITAL_COMMUNITY): Payer: Self-pay | Admitting: Orthopedic Surgery

## 2018-12-01 LAB — GLUCOSE, CAPILLARY
Glucose-Capillary: 348 mg/dL — ABNORMAL HIGH (ref 70–99)
Glucose-Capillary: 356 mg/dL — ABNORMAL HIGH (ref 70–99)
Glucose-Capillary: 391 mg/dL — ABNORMAL HIGH (ref 70–99)
Glucose-Capillary: 416 mg/dL — ABNORMAL HIGH (ref 70–99)
Glucose-Capillary: 319 mg/dL — ABNORMAL HIGH (ref 70–99)

## 2018-12-01 MED ORDER — INSULIN ASPART 100 UNIT/ML ~~LOC~~ SOLN
30.0000 [IU] | Freq: Three times a day (TID) | SUBCUTANEOUS | Status: DC
  Administered 2018-12-01 – 2018-12-04 (×9): 30 [IU] via SUBCUTANEOUS

## 2018-12-01 MED ORDER — INSULIN ASPART 100 UNIT/ML ~~LOC~~ SOLN
30.0000 [IU] | Freq: Once | SUBCUTANEOUS | Status: AC
  Administered 2018-12-01: 30 [IU] via SUBCUTANEOUS

## 2018-12-01 NOTE — Progress Notes (Signed)
Pt's CBG 391 at 1735. He received 30 units novolog insulin at 1811. Pt did eat 90% of his dinner.  Pt was concerned that his blood sugar was high and feels he did not receive enough insulin. He asked for his blood sugar to be checked again. At 2014 his CBG was 416. Pt feels he needs more insulin and requested that I call the MD. Pt stated that he would take 40 units Novolog for that blood sugar. Dr. Lorin Mercy notified. 30 units novolog insulin administered per one time order. Will continue to monitor. Pt states he knows the symptoms of low blood sugar and will let me know if feels hypoglycemic.

## 2018-12-01 NOTE — Progress Notes (Signed)
PT Cancellation Note  Patient Details Name: Kristopher Simon MRN: [id number] DOB: [birth year]   Cancelled Treatment:    Reason Eval/Treat Not Completed: PT screened, no needs identified, will sign off.  I spoke to Kristopher Simon at length and he is unfortunately very experienced in mobility and transfers.  He was able to work with OT earlier today and did very well.  He did verbalize that he has an HEP program that he will follow at home, has his mom's supervision and other male family members to assist PRN.  He has a ramped home entry and essentially operates at a WC level at home.  He has no acute or current follow up therapy needs at this time.  I advised him to get up with RN staff multiple times per day while here in the hospital.  See OT evaluation for details about mobility.  PT agreeable he doesn't need PT at this time.  Thanks,  Kristopher Simon. Kristopher Simon, PT, DPT  Acute Rehabilitation [phone] pager [phone]) [phone] office     Kristopher Simon 12/01/2018, 1:55 PM

## 2018-12-01 NOTE — Progress Notes (Signed)
Patient ID: Kristopher Simon, male   DOB: [birth year], 53 y.o.   MRN: [id number] Postoperative day 1 revision left transtibial amputation.  Patient did have an abscess, tissue cultures are negative to date, patient on Vanco and Zosyn patient does have bruising from his Plavix and aspirin wound VAC 300 cc.  Awaiting culture sensitivities

## 2018-12-01 NOTE — Evaluation (Signed)
Occupational Therapy Evaluation Patient Details Name: Kristopher Simon MRN: [id number] DOB: 05/04/1966 Today's Date: 12/01/2018    History of Present Illness Pt is a 53 y/o male s/p revision L BKA due to decubitus tissue damage. PMH including but not limited to CAD, HTN, DM, stroke and anxiety.   Clinical Impression   Pt PTA: living with mother and independent with ADL and mobility - sink baths due to healing L BKA. Pt currently performing bed mobility, transfers and hopping on RLE with bariatric RW in room ~3 feet to recliner with management for lines at [place] level. Pt performing ADL at EOB set-upA for LB ADL and set-upA for grooming. Pt with appropriate DME and AE at home. Pt at his functional baseline for ADL and OT services. No continued OT follow-up required. OT signing off.  O2 >90% on RA.    Follow Up Recommendations  No OT follow up;Supervision - Intermittent    Equipment Recommendations  None recommended by OT    Recommendations for Other Services       Precautions / Restrictions Precautions Precautions: Fall;Other (comment) Precaution Comments: wound vac Required Braces or Orthoses: Splint/Cast(LLE) Restrictions Weight Bearing Restrictions: Yes LLE Weight Bearing: Non weight bearing      Mobility Bed Mobility Overal bed mobility: Needs Assistance             General bed mobility comments: supervision for management of lines  Transfers Overall transfer level: Needs assistance Equipment used: Rolling walker (2 wheeled)(bariatric) Transfers: Sit to/from Stand;Stand Pivot Transfers Sit to Stand: Supervision;From elevated surface Stand pivot transfers: Supervision;From elevated surface       General transfer comment: Did not need physical assist other than movig recliner closer for transfer and management of lines    Balance Overall balance assessment: Needs assistance   Sitting balance-Leahy Scale: Good       Standing balance-Leahy Scale: Fair                              ADL either performed or assessed with clinical judgement   ADL Overall ADL's : At baseline                                       General ADL Comments: Pt continues to be supervision level for sitting EOB, standing- with line management, hopping on RLE to recliner and back to bed with bariatric RW. Pt has large BSC At home and at his functional baseline for ADL. Pt donning shorts and briefs with minguardA..     Vision Baseline Vision/History: No visual deficits Vision Assessment?: No apparent visual deficits     Perception     Praxis      Pertinent Vitals/Pain Pain Assessment: 0-10 Pain Score: 7  Pain Location: LLE     Hand Dominance Right   Extremity/Trunk Assessment Upper Extremity Assessment Upper Extremity Assessment: Overall WFL for tasks assessed   Lower Extremity Assessment Lower Extremity Assessment: Defer to PT evaluation;LLE deficits/detail LLE Deficits / Details: s/p L BKA revision   Cervical / Trunk Assessment Cervical / Trunk Assessment: Normal   Communication Communication Communication: No difficulties   Cognition Arousal/Alertness: Awake/alert Behavior During Therapy: WFL for tasks assessed/performed Overall Cognitive Status: Within Functional Limits for tasks assessed  General Comments  Pt performing ADL functional mobility with RW and fair stability. pt supervisionA for most tasks based on line management.    Exercises     Shoulder Instructions      Home Living Family/patient expects to be discharged to:: Private residence Living Arrangements: Parent Available Help at Discharge: Family;Available 24 hours/day Type of Home: House Home Access: Ramped entrance     Home Layout: One level     Bathroom Shower/Tub: Teacher, early years/pre: Standard Bathroom Accessibility: No   Home Equipment: Bedside commode;Walker - 2  wheels;Wheelchair - Brewing technologist;Tub bench   Additional Comments: was sponge bathing due to wound on foot      Prior Functioning/Environment Level of Independence: Independent with assistive device(s)        Comments: wheelchair for primary mobility. Able to ambulate with RW up to 50 feet.        OT Problem List: Decreased activity tolerance;Impaired balance (sitting and/or standing);Pain      OT Treatment/Interventions:      OT Goals(Current goals can be found in the care plan section)    OT Frequency:     Barriers to D/C:            Co-evaluation              AM-PAC OT "6 Clicks" Daily Activity     Outcome Measure Help from another person eating meals?: None Help from another person taking care of personal grooming?: None Help from another person toileting, which includes using toliet, bedpan, or urinal?: A Little Help from another person bathing (including washing, rinsing, drying)?: None Help from another person to put on and taking off regular upper body clothing?: None Help from another person to put on and taking off regular lower body clothing?: None 6 Click Score: 23   End of Session Equipment Utilized During Treatment: Rolling walker Nurse Communication: Mobility status  Activity Tolerance: Patient tolerated treatment well Patient left: in bed;with call bell/phone within reach  OT Visit Diagnosis: Unsteadiness on feet (R26.81);Muscle weakness (generalized) (M62.81);Pain Pain - Right/Left: Left Pain - part of body: Knee                Time: 1032-1130 OT Time Calculation (min): 58 min Charges:  OT General Charges $OT Visit: 1 Visit OT Evaluation $OT Eval Moderate Complexity: 1 Mod OT Treatments $Self Care/Home Management : 23-37 mins $Therapeutic Activity: 8-22 mins  Ebony Hail Harold Hedge) Marsa Aris OTR/L Acute Rehabilitation Services Pager: [phone] Office: [facility] 12/01/2018, 11:43 AM

## 2018-12-02 LAB — GLUCOSE, CAPILLARY
Glucose-Capillary: 229 mg/dL — ABNORMAL HIGH (ref 70–99)
Glucose-Capillary: 175 mg/dL — ABNORMAL HIGH (ref 70–99)
Glucose-Capillary: 163 mg/dL — ABNORMAL HIGH (ref 70–99)
Glucose-Capillary: 181 mg/dL — ABNORMAL HIGH (ref 70–99)
Glucose-Capillary: 135 mg/dL — ABNORMAL HIGH (ref 70–99)

## 2018-12-02 MED ORDER — INSULIN ASPART 100 UNIT/ML ~~LOC~~ SOLN
0.0000 [IU] | Freq: Three times a day (TID) | SUBCUTANEOUS | Status: DC
  Administered 2018-12-02 – 2018-12-04 (×5): 4 [IU] via SUBCUTANEOUS

## 2018-12-02 MED ORDER — INSULIN ASPART 100 UNIT/ML ~~LOC~~ SOLN: 0.0000 [IU] | Freq: Every day | SUBCUTANEOUS | Status: DC

## 2018-12-02 MED ORDER — SODIUM CHLORIDE 0.9 % IV SOLN
1.0000 g | Freq: Three times a day (TID) | INTRAVENOUS | Status: DC
  Administered 2018-12-02 – 2018-12-04 (×6): 1 g via INTRAVENOUS
  Filled 2018-12-02 (×11): qty 1

## 2018-12-02 NOTE — Progress Notes (Signed)
   Subjective: 2 Days Post-Op Procedure(s) (LRB): REVISION LEFT BELOW KNEE AMPUTATION (Left) Patient reports pain as mild and moderate.    Objective: Vital signs in last 24 hours: Temp:  [97.5 F (36.4 C)-98.5 F (36.9 C)] 97.5 F (36.4 C) (04/12 0427) Pulse Rate:  [75-84] 75 (04/12 0427) Resp:  [16-18] 18 (04/12 0427) BP: (123-136)/(59-72) 123/72 (04/12 0427) SpO2:  [95 %-99 %] 98 % (04/12 0427)  Intake/Output from previous day: 04/11 0701 - 04/12 0700 In: 965.2 [P.O.:240; IV Piggyback:725.2] Out: 2025 [Urine:1625; Drains:400] Intake/Output this shift: No intake/output data recorded.  Recent Labs    11/30/18 1148 11/30/18 1820  HGB 11.3* 9.2*   Recent Labs    11/30/18 1148 11/30/18 1820  WBC 12.1* 19.8*  RBC 4.79 3.81*  HCT 39.5 30.4*  PLT 465* 490*   Recent Labs    11/30/18 1148  NA 134*  K 5.5*  CL 100  CO2 22  BUN 15  CREATININE 1.03  GLUCOSE 191*  CALCIUM 9.4   No results for input(s): LABPT, INR in the last 72 hours.  VAC good seal, dressing dry. leg elevated No results found.  Assessment/Plan: 2 Days Post-Op Procedure(s) (LRB): REVISION LEFT BELOW KNEE AMPUTATION (Left) Plan:  VAC, SSI ordered.  Kristopher Simon 12/02/2018, 10:03 AM

## 2018-12-02 NOTE — Progress Notes (Signed)
Pharmacy Antibiotic Note  Kristopher Simon is a 53 y.o. male admitted on 11/30/2018 with R BKA wound infection.  Pharmacy has been consulted for Vancomycin and Zosyn dosing. Pt went to OR 4/10 for revision of R BKA due to abscess and conservative measures failed.  Abscess culture is growing ESBL Klebsiella sensitive to imipenem.   Plan: Stop Zosyn and Vancomycin. Change to Meropenem 1g IV every 8 hours Monitor renal function, culture results, and clinical status  Height: 6\' 5"  (195.6 cm) Weight: (!) 381 lb (172.8 kg) IBW/kg (Calculated) : 89.1  Temp (24hrs), Avg:97.8 F (36.6 C), Min:97.5 F (36.4 C), Max:98 F (36.7 C)  Recent Labs  Lab 11/30/18 1148 11/30/18 1820  WBC 12.1* 19.8*  CREATININE 1.03  --     Estimated Creatinine Clearance: 145.5 mL/min (by C-G formula based on SCr of 1.03 mg/dL).    Allergies  Allergen Reactions  . Sulfamethoxazole-Trimethoprim Anaphylaxis and Swelling     (BACTRIM) tongue swells  . Pregabalin Swelling    SWELLING REACTION UNSPECIFIED     Antimicrobials this admission: 4/10 Ancef x 1 4/10 Vanc >>  4/10 Zosyn >>   Microbiology results: 4/10 Abscess cx: ESBL Klebsiella (sensitive to Imipenem)  Thank you for allowing pharmacy to be a part of this patient's care.  Sloan Leiter, PharmD, BCPS, BCCCP Clinical Pharmacist **Pharmacist phone directory can now be found on [email or web address] ([email or web address]).  Listed under [place]. 12/02/2018 2:57 PM

## 2018-12-02 NOTE — Progress Notes (Signed)
Patients current BG is 163 mg/dl.  Orders call for 30 units + 4 units sliding scale.  I expressed my concern with giving the patient this much insulin for the BG level.  He is insistent upon receiving the 34 units.  Patient educated on signs and symptoms of hypoglycemia.  Will continue to monitor.

## 2018-12-02 NOTE — Plan of Care (Signed)
  Problem: Activity: Goal: Ability to tolerate increased activity will improve Outcome: Progressing   Problem: Education: Goal: Verbalization of understanding the information provided will improve Outcome: Progressing   Problem: Pain Management: Goal: Pain level will decrease Outcome: Progressing

## 2018-12-03 LAB — GLUCOSE, CAPILLARY
Glucose-Capillary: 161 mg/dL — ABNORMAL HIGH (ref 70–99)
Glucose-Capillary: 113 mg/dL — ABNORMAL HIGH (ref 70–99)
Glucose-Capillary: 101 mg/dL — ABNORMAL HIGH (ref 70–99)
Glucose-Capillary: 139 mg/dL — ABNORMAL HIGH (ref 70–99)

## 2018-12-03 LAB — CREATININE, SERUM
Creatinine, Ser: 1.13 mg/dL (ref 0.61–1.24)
GFR calc Af Amer: >60 mL/min (ref >60)
GFR calc non Af Amer: >60 mL/min (ref >60)

## 2018-12-03 NOTE — Progress Notes (Signed)
Patient ID: Kristopher Simon, male   DOB: Mar 24, 1966, 53 y.o.   MRN: [id number] Patient without complaints this morning.  Patient is filled up 1 canister and currently has 150 cc of clear serosanguineous fluid in the second canister.  Patient was on Plavix and aspirin these have been discontinued but he still has significant bruising.  Patient's cultures are positive for staph and Klebsiella.  Anticipate patient could be discharged on Bactrim DS.  Will discharge when the drainage has decreased.

## 2018-12-03 NOTE — Progress Notes (Signed)
Placed Pt on contact precautions per IP. Will endorse accordingly.
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
## 2018-12-03 NOTE — Plan of Care (Signed)
  Problem: Education: Goal: Verbalization of understanding the information provided will improve Outcome: Progressing   Problem: Pain Management: Goal: Pain level will decrease Outcome: Progressing   Problem: Skin Integrity: Goal: Signs of wound healing will improve Outcome: Progressing   Problem: Clinical Measurements: Goal: Ability to maintain clinical measurements within normal limits will improve Outcome: Progressing   Problem: Activity: Goal: Risk for activity intolerance will decrease Outcome: Progressing   Problem: Nutrition: Goal: Adequate nutrition will be maintained Outcome: Progressing   Problem: Elimination: Goal: Will not experience complications related to bowel motility Outcome: Progressing

## 2018-12-03 NOTE — Plan of Care (Signed)
  Problem: Pain Managment: Goal: General experience of comfort will improve Outcome: Progressing   Problem: Safety: Goal: Ability to remain free from injury will improve Outcome: Progressing   

## 2018-12-04 ENCOUNTER — Inpatient Hospital Stay: Payer: Self-pay

## 2018-12-04 LAB — GLUCOSE, CAPILLARY
Glucose-Capillary: 191 mg/dL — ABNORMAL HIGH (ref 70–99)
Glucose-Capillary: 200 mg/dL — ABNORMAL HIGH (ref 70–99)

## 2018-12-04 MED ORDER — OXYCODONE-ACETAMINOPHEN 10-325 MG PO TABS: 1.0000 | ORAL_TABLET | ORAL | 0 refills | Status: DC | PRN

## 2018-12-04 MED ORDER — SODIUM CHLORIDE 0.9% FLUSH: 10.0000 mL | INTRAVENOUS | Status: DC | PRN

## 2018-12-04 MED ORDER — ERTAPENEM IV (FOR PTA / DISCHARGE USE ONLY): 1.0000 g | INTRAVENOUS | 0 refills | Status: DC

## 2018-12-04 MED ORDER — CIPROFLOXACIN HCL 500 MG PO TABS: 500.0000 mg | ORAL_TABLET | Freq: Two times a day (BID) | ORAL | 0 refills | Status: DC

## 2018-12-04 MED ORDER — SODIUM CHLORIDE 0.9 % IV SOLN
1.0000 g | INTRAVENOUS | Status: DC
  Administered 2018-12-04: 1000 mg via INTRAVENOUS
  Filled 2018-12-04 (×2): qty 1

## 2018-12-04 MED ORDER — HEPARIN SOD (PORK) LOCK FLUSH 100 UNIT/ML IV SOLN
250.0000 [IU] | INTRAVENOUS | Status: AC | PRN
  Administered 2018-12-04: 250 [IU]

## 2018-12-04 NOTE — Progress Notes (Signed)
PHARMACY CONSULT NOTE FOR:  OUTPATIENT  PARENTERAL ANTIBIOTIC THERAPY (OPAT)  Indication: ESBL Klebsiella BKA wound infection  Regimen: Ertapenem 1g IV every 24 hours End date: 12/15/18  IV antibiotic discharge orders are pended. To discharging provider:  please sign these orders via discharge navigator,  Select New Orders & click on the button choice - Manage This Unsigned Work.     Thank you for allowing pharmacy to be a part of this patient's care.  Kristopher Simon 12/04/2018, 10:26 AM

## 2018-12-04 NOTE — Progress Notes (Signed)
Discharge instructions reviewed with pt and instructed on where to pick up prescriptions.  Pt verbalized understanding and had no questions.  Pt's wound VAC exchanged out for portable home VAC.  Pt discharged in stable condition via wheelchair with family.  Kristopher Simon

## 2018-12-04 NOTE — Progress Notes (Signed)
Patient ID: Kristopher Simon, male   DOB: Jul 29, 1966, 53 y.o.   MRN: [id number]  Spoke with the patient by phone to let him know that he is going to require 14 days of IV antibiotics, Ertapenem per a PICC line which will be placed today by IV therapy. He will not discharge until tomorrow.  [place]

## 2018-12-04 NOTE — Discharge Summary (Signed)
Discharge Diagnoses:  Active Problems:   S/P below knee amputation, left ([place])   Surgeries: Procedure(s): REVISION LEFT BELOW KNEE AMPUTATION on 11/30/2018    Consultants:   Discharged Condition: Improved  Hospital Course: Kristopher Simon is an 53 y.o. male who was admitted 11/30/2018 with a chief complaint of dehiscence transtibial amputation, with a final diagnosis of Abscess Left Below Knee Amputation.  Patient was brought to the operating room on 11/30/2018 and underwent Procedure(s): REVISION LEFT BELOW KNEE AMPUTATION.    Patient was given perioperative antibiotics:  Anti-infectives (From admission, onward)   Start     Dose/Rate Route Frequency Ordered Stop   12/04/18 0000  ciprofloxacin (CIPRO) 500 MG tablet     500 mg Oral 2 times daily 12/04/18 0734     12/02/18 1515  meropenem (MERREM) 1 g in sodium chloride 0.9 % 100 mL IVPB     1 g 200 mL/hr over 30 Minutes Intravenous Every 8 hours 12/02/18 1506     12/01/18 0800  vancomycin (VANCOCIN) 2,000 mg in sodium chloride 0.9 % 500 mL IVPB  Status:  Discontinued     2,000 mg 250 mL/hr over 120 Minutes Intravenous Every 12 hours 11/30/18 1752 12/02/18 1505   11/30/18 1800  piperacillin-tazobactam (ZOSYN) IVPB 3.375 g  Status:  Discontinued     3.375 g 12.5 mL/hr over 240 Minutes Intravenous Every 8 hours 11/30/18 1752 12/02/18 1505   11/30/18 1800  vancomycin (VANCOCIN) 2,500 mg in sodium chloride 0.9 % 500 mL IVPB     2,500 mg 250 mL/hr over 120 Minutes Intravenous NOW 11/30/18 1752 11/30/18 2035   11/30/18 1500  ceFAZolin (ANCEF) 3 g in dextrose 5 % 50 mL IVPB     3 g 100 mL/hr over 30 Minutes Intravenous To [facility] 11/29/18 1155 11/30/18 1506    .  Patient was given sequential compression devices, early ambulation, and aspirin for DVT prophylaxis.  Recent vital signs:  Patient Vitals for the past 24 hrs:  BP Temp Temp src Pulse Resp SpO2  12/04/18 0538 (!) 132/54 97.9 F (36.6 C) Oral 82 18 99 %  12/03/18 2042  (!) 126/57 97.7 F (36.5 C) Oral 78 18 100 %  12/03/18 1352 133/68 98.4 F (36.9 C) Oral 74 18 98 %  .  Recent laboratory studies: No results found.  Discharge Medications:   Allergies as of 12/04/2018      Reactions   Sulfamethoxazole-trimethoprim Anaphylaxis, Swelling    (BACTRIM) tongue swells   Pregabalin Swelling   SWELLING REACTION UNSPECIFIED       Medication List    STOP taking these medications   oxyCODONE-acetaminophen 5-325 MG tablet Commonly known as:  PERCOCET/ROXICET Replaced by:  oxyCODONE-acetaminophen 10-325 MG tablet     TAKE these medications   acetaminophen 325 MG tablet Commonly known as:  TYLENOL Take 1-2 tablets (325-650 mg total) by mouth every 4 (four) hours as needed for mild pain.   ALPRAZolam 1 MG tablet Commonly known as:  XANAX Take 1 mg by mouth as needed for anxiety. Has had a prescription   amLODipine 5 MG tablet Commonly known as:  NORVASC Take 1 tablet (5 mg total) by mouth daily.   aspirin EC 81 MG tablet Take 1 tablet (81 mg total) by mouth daily.   ciprofloxacin 500 MG tablet Commonly known as:  Cipro Take 1 tablet (500 mg total) by mouth 2 (two) times daily.   citalopram 10 MG tablet Commonly known as:  CELEXA Take 3 tablets (30  mg total) by mouth daily. What changed:    how much to take  when to take this   clopidogrel 75 MG tablet Commonly known as:  PLAVIX Take 75 mg by mouth at bedtime.   cyclobenzaprine 5 MG tablet Commonly known as:  FLEXERIL Take 1 tablet (5 mg total) by mouth 3 (three) times daily as needed for muscle spasms. What changed:  when to take this   esomeprazole 40 MG capsule Commonly known as:  NEXIUM Take 1 capsule (40 mg total) by mouth every morning.   gabapentin 300 MG capsule Commonly known as:  NEURONTIN Take 1 capsule (300 mg total) by mouth 3 (three) times daily.   gemfibrozil 600 MG tablet Commonly known as:  LOPID Take 1 tablet (600 mg total) by mouth 2 (two) times daily before  a meal.   hydrALAZINE 25 MG tablet Commonly known as:  APRESOLINE Take 1 tablet (25 mg total) by mouth every 8 (eight) hours.   hydrocerin Crea Apply 1 application topically 2 (two) times daily. To dry skin on right foot/leg   hydrOXYzine 10 MG tablet Commonly known as:  ATARAX/VISTARIL Take 1 tablet (10 mg total) by mouth 3 (three) times daily.   insulin aspart 100 UNIT/ML injection Commonly known as:  novoLOG Inject 20 Units into the skin 3 (three) times daily with meals. What changed:    how much to take  when to take this   insulin detemir 100 UNIT/ML injection Commonly known as:  LEVEMIR Inject 0.7 mLs (70 Units total) into the skin 2 (two) times daily. What changed:  how much to take   Metoprolol Tartrate 75 MG Tabs Take 75 mg by mouth 2 (two) times daily.   Nucynta 100 MG Tabs Generic drug:  Tapentadol HCl Take 1 tablet (100 mg total) by mouth every 6 (six) hours. After a week or so --then try weaning to as needed as pain gets better. What changed:  additional instructions   oxyCODONE-acetaminophen 10-325 MG tablet Commonly known as:  PERCOCET Take 1 tablet by mouth every 4 (four) hours as needed for pain. Replaces:  oxyCODONE-acetaminophen 5-325 MG tablet   polyethylene glycol 17 g packet Commonly known as:  MIRALAX / GLYCOLAX Take 17 g by mouth 2 (two) times daily.   pravastatin 20 MG tablet Commonly known as:  PRAVACHOL Take 20 mg by mouth at bedtime.   saccharomyces boulardii 250 MG capsule Commonly known as:  FLORASTOR Take 1 capsule (250 mg total) by mouth 2 (two) times daily.   traZODone 50 MG tablet Commonly known as:  DESYREL Take 0.5-1 tablets (25-50 mg total) by mouth at bedtime as needed for sleep. What changed:  how much to take   [place] FlexTouch 100 UNIT/ML Sopn FlexTouch Pen Generic drug:  insulin degludec Inject 77 Units into the skin 2 (two) times daily.   Victoza 18 MG/3ML Sopn Generic drug:  liraglutide Inject 0.3 mLs (1.8 mg  total) into the skin daily.       Diagnostic Studies: No results found.  Patient benefited maximally from their hospital stay and there were no complications.     Disposition: Discharge disposition: 01-Home or Self Care      Discharge Instructions    Call MD / Call 911   Complete by:  As directed    If you experience chest pain or shortness of breath, CALL 911 and be transported to the hospital emergency room.  If you develope a fever above 101 F, pus (white drainage) or  increased drainage or redness at the wound, or calf pain, call your surgeon's office.   Constipation Prevention   Complete by:  As directed    Drink plenty of fluids.  Prune juice may be helpful.  You may use a stool softener, such as Colace (over the counter) 100 mg twice a day.  Use MiraLax (over the counter) for constipation as needed.   Diet - low sodium heart healthy   Complete by:  As directed    Increase activity slowly as tolerated   Complete by:  As directed    Negative Pressure Wound Therapy - Incisional   Complete by:  As directed    Negative Pressure Wound Therapy - Incisional   Complete by:  As directed         Signed: Newt Minion 12/04/2018, 7:34 AM

## 2018-12-04 NOTE — TOC Transition Note (Addendum)
Transition of Care [facility]) - CM/SW Discharge Note   Patient Details  Name: Kristopher Simon MRN: [id number] Date of Birth: [birth year]  [facility]) CM/SW Contact:  Marilu Favre, RN Phone Number: 12/04/2018, 10:27 AM   Clinical Narrative:     Patient from home with mother. Has all DME. Plan to discharge to home today on IV ABX for 2 weeks.   Home address is : [address] [place] ,[place]  [place].gov list provided . Patient requesting [facility] and [facility]. Patient has been at home in past with IV ABX.   Referral given to [facility] with [facility].  Left message with Linna Hoff with [facility] awaiting call back. Dan accepted referral.  Final next level of care: [place] Barriers to Discharge: No Barriers Identified   Patient Goals and CMS Choice Patient states their goals for this hospitalization and ongoing recovery are:: to go home  CMS Medicare.gov Compare Post Acute Care list provided to:: Patient Choice offered to / list presented to : Patient  Discharge Placement                       Discharge Plan and Services In-house Referral: NA Discharge Planning Services: CM Consult Post Acute Care Choice: Home Health          DME Arranged: N/A DME Agency: NA HH Arranged: RN [place] Agency: [place] ([facility])   Social Determinants of Health (SDOH) Interventions     Readmission Risk Interventions No flowsheet data found.

## 2018-12-04 NOTE — Discharge Summary (Signed)
Discharge Diagnoses:  Active Problems:   S/P below knee amputation, left ([place])   Surgeries: Procedure(s): REVISION LEFT BELOW KNEE AMPUTATION on 11/30/2018    Consultants:   Discharged Condition: Improved  Hospital Course: Kristopher Simon is an 53 y.o. male who was admitted 11/30/2018 with a chief complaint of abscess left BKA, with a final diagnosis of Abscess Left Below Knee Amputation.  Patient was brought to the operating room on 11/30/2018 and underwent Procedure(s): REVISION LEFT BELOW KNEE AMPUTATION.    Patient was given perioperative antibiotics:  Anti-infectives (From admission, onward)   Start     Dose/Rate Route Frequency Ordered Stop   12/05/18 0000  ertapenem [facility]) IVPB     1 g Intravenous Every 24 hours 12/04/18 1336 12/15/18 2359   12/04/18 1000  ertapenem (INVANZ) 1,000 mg in sodium chloride 0.9 % 100 mL IVPB     1 g 200 mL/hr over 30 Minutes Intravenous Every 24 hours 12/04/18 0929 12/18/18 0959   12/04/18 0000  ciprofloxacin (CIPRO) 500 MG tablet     500 mg Oral 2 times daily 12/04/18 0734     12/02/18 1515  meropenem (MERREM) 1 g in sodium chloride 0.9 % 100 mL IVPB  Status:  Discontinued     1 g 200 mL/hr over 30 Minutes Intravenous Every 8 hours 12/02/18 1506 12/04/18 0929   12/01/18 0800  vancomycin (VANCOCIN) 2,000 mg in sodium chloride 0.9 % 500 mL IVPB  Status:  Discontinued     2,000 mg 250 mL/hr over 120 Minutes Intravenous Every 12 hours 11/30/18 1752 12/02/18 1505   11/30/18 1800  piperacillin-tazobactam (ZOSYN) IVPB 3.375 g  Status:  Discontinued     3.375 g 12.5 mL/hr over 240 Minutes Intravenous Every 8 hours 11/30/18 1752 12/02/18 1505   11/30/18 1800  vancomycin (VANCOCIN) 2,500 mg in sodium chloride 0.9 % 500 mL IVPB     2,500 mg 250 mL/hr over 120 Minutes Intravenous NOW 11/30/18 1752 11/30/18 2035   11/30/18 1500  ceFAZolin (ANCEF) 3 g in dextrose 5 % 50 mL IVPB     3 g 100 mL/hr over 30 Minutes Intravenous To [facility]Stay [facility] 11/29/18 1155  11/30/18 1506    .  Patient was given sequential compression devices, early ambulation, and aspirin for DVT prophylaxis.  Recent vital signs:  Patient Vitals for the past 24 hrs:  BP Temp Temp src Pulse Resp SpO2  12/04/18 0538 (!) 132/54 97.9 F (36.6 C) Oral 82 18 99 %  12/03/18 2042 (!) 126/57 97.7 F (36.5 C) Oral 78 18 100 %  12/03/18 1352 133/68 98.4 F (36.9 C) Oral 74 18 98 %  .  Recent laboratory studies: [place] E[place] Site Rite  Result Date: 12/04/2018 If Site Rite image not attached, placement could not be confirmed due to current cardiac rhythm.   Discharge Medications:   Allergies as of 12/04/2018      Reactions   Sulfamethoxazole-trimethoprim Anaphylaxis, Swelling    (BACTRIM) tongue swells   Pregabalin Swelling   SWELLING REACTION UNSPECIFIED       Medication List    STOP taking these medications   oxyCODONE-acetaminophen 5-325 MG tablet Commonly known as:  PERCOCET/ROXICET Replaced by:  oxyCODONE-acetaminophen 10-325 MG tablet     TAKE these medications   acetaminophen 325 MG tablet Commonly known as:  TYLENOL Take 1-2 tablets (325-650 mg total) by mouth every 4 (four) hours as needed for mild pain.   ALPRAZolam 1 MG tablet Commonly known as:  XANAX Take 1 mg  by mouth as needed for anxiety. Has had a prescription   amLODipine 5 MG tablet Commonly known as:  NORVASC Take 1 tablet (5 mg total) by mouth daily.   aspirin EC 81 MG tablet Take 1 tablet (81 mg total) by mouth daily.   ciprofloxacin 500 MG tablet Commonly known as:  Cipro Take 1 tablet (500 mg total) by mouth 2 (two) times daily.   citalopram 10 MG tablet Commonly known as:  CELEXA Take 3 tablets (30 mg total) by mouth daily. What changed:    how much to take  when to take this   clopidogrel 75 MG tablet Commonly known as:  PLAVIX Take 75 mg by mouth at bedtime.   cyclobenzaprine 5 MG tablet Commonly known as:  FLEXERIL Take 1 tablet (5 mg total) by mouth 3 (three) times daily  as needed for muscle spasms. What changed:  when to take this   ertapenem  IVPB Commonly known as:  INVANZ Inject 1 g into the vein daily for 10 days. Indication:  ESBL Klebsiella wound infection Last Day of Therapy:  12/15/18 Labs - Once weekly:  CBC/D and BMP, Labs - Every other week:  ESR and CRP Start taking on:  December 05, 2018   esomeprazole 40 MG capsule Commonly known as:  NEXIUM Take 1 capsule (40 mg total) by mouth every morning.   gabapentin 300 MG capsule Commonly known as:  NEURONTIN Take 1 capsule (300 mg total) by mouth 3 (three) times daily.   gemfibrozil 600 MG tablet Commonly known as:  LOPID Take 1 tablet (600 mg total) by mouth 2 (two) times daily before a meal.   hydrALAZINE 25 MG tablet Commonly known as:  APRESOLINE Take 1 tablet (25 mg total) by mouth every 8 (eight) hours.   hydrocerin Crea Apply 1 application topically 2 (two) times daily. To dry skin on right foot/leg   hydrOXYzine 10 MG tablet Commonly known as:  ATARAX/VISTARIL Take 1 tablet (10 mg total) by mouth 3 (three) times daily.   insulin aspart 100 UNIT/ML injection Commonly known as:  novoLOG Inject 20 Units into the skin 3 (three) times daily with meals. What changed:    how much to take  when to take this   insulin detemir 100 UNIT/ML injection Commonly known as:  LEVEMIR Inject 0.7 mLs (70 Units total) into the skin 2 (two) times daily. What changed:  how much to take   Metoprolol Tartrate 75 MG Tabs Take 75 mg by mouth 2 (two) times daily.   Nucynta 100 MG Tabs Generic drug:  Tapentadol HCl Take 1 tablet (100 mg total) by mouth every 6 (six) hours. After a week or so --then try weaning to as needed as pain gets better. What changed:  additional instructions   oxyCODONE-acetaminophen 10-325 MG tablet Commonly known as:  PERCOCET Take 1 tablet by mouth every 4 (four) hours as needed for pain. Replaces:  oxyCODONE-acetaminophen 5-325 MG tablet   polyethylene glycol 17  g packet Commonly known as:  MIRALAX / GLYCOLAX Take 17 g by mouth 2 (two) times daily.   pravastatin 20 MG tablet Commonly known as:  PRAVACHOL Take 20 mg by mouth at bedtime.   saccharomyces boulardii 250 MG capsule Commonly known as:  FLORASTOR Take 1 capsule (250 mg total) by mouth 2 (two) times daily.   traZODone 50 MG tablet Commonly known as:  DESYREL Take 0.5-1 tablets (25-50 mg total) by mouth at bedtime as needed for sleep. What changed:  how much to take   [place] FlexTouch 100 UNIT/ML Sopn FlexTouch Pen Generic drug:  insulin degludec Inject 77 Units into the skin 2 (two) times daily.   Victoza 18 MG/3ML Sopn Generic drug:  liraglutide Inject 0.3 mLs (1.8 mg total) into the skin daily.            Home Infusion Instuctions  (From admission, onward)         Start     Ordered   12/04/18 0000  Home infusion instructions [facility] May follow [place] Dosing Protocol; May administer Cathflo as needed to maintain patency of vascular access device.; Flushing of vascular access device: per [facility] Protocol: 0.9% NaCl pre/post medica...    Question Answer Comment  Instructions May follow [place] Dosing Protocol   Instructions May administer Cathflo as needed to maintain patency of vascular access device.   Instructions Flushing of vascular access device: per [facility] Protocol: 0.9% NaCl pre/post medication administration and prn patency; Heparin 100 u/ml, 48m for implanted ports and Heparin 10u/ml, 518mfor all other central venous catheters.   Instructions May follow [facility] Anaphylaxis Protocol for First Dose Administration in the home: 0.9% NaCl at 25-50 ml/hr to maintain IV access for protocol meds. Epinephrine 0.3 ml IV/IM PRN and Benadryl 25-50 IV/IM PRN s/s of anaphylaxis.   Instructions [facility] Infusion Coordinator (RN) to assist per patient IV care needs in the home PRN.      12/04/18 1336          Diagnostic Studies: [place] Site  Rite  Result Date: 12/04/2018 If Site Rite image not attached, placement could not be confirmed due to current cardiac rhythm.   Patient benefited maximally from their hospital stay and there were no complications.     Disposition: Discharge disposition: 01-Home or Self Care      Discharge Instructions    Call MD / Call 911   Complete by:  As directed    If you experience chest pain or shortness of breath, CALL 911 and be transported to the hospital emergency room.  If you develope a fever above 101 F, pus (white drainage) or increased drainage or redness at the wound, or calf pain, call your surgeon's office.   Call MD / Call 911   Complete by:  As directed    If you experience chest pain or shortness of breath, CALL 911 and be transported to the hospital emergency room.  If you develope a fever above 101 F, pus (white drainage) or increased drainage or redness at the wound, or calf pain, call your surgeon's office.   Constipation Prevention   Complete by:  As directed    Drink plenty of fluids.  Prune juice may be helpful.  You may use a stool softener, such as Colace (over the counter) 100 mg twice a day.  Use MiraLax (over the counter) for constipation as needed.   Constipation Prevention   Complete by:  As directed    Drink plenty of fluids.  Prune juice may be helpful.  You may use a stool softener, such as Colace (over the counter) 100 mg twice a day.  Use MiraLax (over the counter) for constipation as needed.   Diet - low sodium heart healthy   Complete by:  As directed    Diet - low sodium heart healthy   Complete by:  As directed    Home infusion instructions [facility] May follow [place]; May administer  Cathflo as needed to maintain patency of vascular access device.; Flushing of vascular access device: per [facility] Protocol: 0.9% NaCl pre/post medica...   Complete by:  As directed    Instructions:  May follow [place] Dosing Protocol    Instructions:  May administer Cathflo as needed to maintain patency of vascular access device.   Instructions:  Flushing of vascular access device: per [facility] Protocol: 0.9% NaCl pre/post medication administration and prn patency; Heparin 100 u/ml, 31m for implanted ports and Heparin 10u/ml, 526mfor all other central venous catheters.   Instructions:  May follow [facility] Anaphylaxis Protocol for First Dose Administration in the home: 0.9% NaCl at 25-50 ml/hr to maintain IV access for protocol meds. Epinephrine 0.3 ml IV/IM PRN and Benadryl 25-50 IV/IM PRN s/s of anaphylaxis.   Instructions:  [facility] Coordinator (RN) to assist per patient IV care needs in the home PRN.   Increase activity slowly as tolerated   Complete by:  As directed    Increase activity slowly as tolerated   Complete by:  As directed    Negative Pressure Wound Therapy - Incisional   Complete by:  As directed    Negative Pressure Wound Therapy - Incisional   Complete by:  As directed      [place].   Contact information: [id number]            Signed: MaNewt Minion/14/2020, 1:37 PM

## 2018-12-05 ENCOUNTER — Ambulatory Visit (INDEPENDENT_AMBULATORY_CARE_PROVIDER_SITE_OTHER): Payer: Medicaid Other | Admitting: Family

## 2018-12-05 LAB — AEROBIC/ANAEROBIC CULTURE W GRAM STAIN (SURGICAL/DEEP WOUND)

## 2018-12-05 LAB — AEROBIC/ANAEROBIC CULTURE (SURGICAL/DEEP WOUND)

## 2018-12-06 ENCOUNTER — Encounter (INDEPENDENT_AMBULATORY_CARE_PROVIDER_SITE_OTHER): Payer: Self-pay | Admitting: Orthopedic Surgery

## 2018-12-06 ENCOUNTER — Ambulatory Visit (INDEPENDENT_AMBULATORY_CARE_PROVIDER_SITE_OTHER): Payer: Self-pay | Admitting: Physician Assistant

## 2018-12-06 ENCOUNTER — Other Ambulatory Visit: Payer: Self-pay

## 2018-12-06 ENCOUNTER — Ambulatory Visit (INDEPENDENT_AMBULATORY_CARE_PROVIDER_SITE_OTHER): Payer: Medicaid Other | Admitting: Physician Assistant

## 2018-12-06 ENCOUNTER — Encounter (HOSPITAL_COMMUNITY): Payer: Self-pay | Admitting: *Deleted

## 2018-12-06 VITALS — Ht 77.0 in | Wt 381.0 lb

## 2018-12-06 DIAGNOSIS — E1142 Type 2 diabetes mellitus with diabetic polyneuropathy: Secondary | ICD-10-CM

## 2018-12-06 DIAGNOSIS — Z89512 Acquired absence of left leg below knee: Secondary | ICD-10-CM

## 2018-12-06 DIAGNOSIS — T8781 Dehiscence of amputation stump: Secondary | ICD-10-CM

## 2018-12-06 DIAGNOSIS — Z794 Long term (current) use of insulin: Secondary | ICD-10-CM

## 2018-12-06 MED ORDER — DEXTROSE 5 % IV SOLN
3.0000 g | Freq: Once | INTRAVENOUS | Status: AC
  Administered 2018-12-07: 3 g via INTRAVENOUS
  Filled 2018-12-06: qty 3

## 2018-12-06 MED ORDER — TRANEXAMIC ACID-NACL 1000-0.7 MG/100ML-% IV SOLN
1000.0000 mg | INTRAVENOUS | Status: AC
  Administered 2018-12-07: 1000 mg via INTRAVENOUS

## 2018-12-06 MED ORDER — TRANEXAMIC ACID 1000 MG/10ML IV SOLN
2000.0000 mg | INTRAVENOUS | Status: AC
  Administered 2018-12-07: 16:00:00 2000 mg via TOPICAL
  Filled 2018-12-06: qty 20

## 2018-12-06 NOTE — Progress Notes (Addendum)
Spoke with patient regarding his pre-op instructions on DOS.  Patient denies SOB, Chest Pain, no new fever (99 last surgery 11/30/18), no cough, no N/V.  Patient states CBG run around 200.  I instructed patient to half his bedtime does of Tresiba - take 38 units tonight and take 38 units on Friday, DOS as along as CBG greater than 70.  Instructed patient that if his CBG was less than 70 to treat with 4 oz apple/cranberry juice.  Then recheck his CBG, if still below 70, call Pre-Op desk at [phone] for further instructions.  Patient states, per MD to stop plavix - last dose was Wed, 12/05/2018.  Aspiin last dose was Thurs, 12/06/2018.   Coronavirus Screening  Have you experienced the following symptoms:  Cough yes/no: No Fever (>100.35F)  yes/no: No Runny nose yes/no: No Sore throat yes/no: No Difficulty breathing/shortness of breath  yes/no: No  Have you or your friend Earnie Larsson traveled in the last 14 days and where? yes/no: No  Patient verbalized understanding of all pre-op instructions given.  Patient informed of the hospital visitor restriction policy that is currently in effect.
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
## 2018-12-07 ENCOUNTER — Inpatient Hospital Stay (HOSPITAL_COMMUNITY)
Admission: RE | Admit: 2018-12-07 | Discharge: 2018-12-11 | DRG: 475 | Disposition: A | Discharge status: Home Health | Payer: Medicaid Other | Attending: Orthopedic Surgery | Admitting: Orthopedic Surgery

## 2018-12-07 ENCOUNTER — Inpatient Hospital Stay (HOSPITAL_COMMUNITY): Payer: Medicaid Other | Admitting: Anesthesiology

## 2018-12-07 ENCOUNTER — Encounter (HOSPITAL_COMMUNITY)
Admission: RE | Disposition: A | Discharge status: Home Health | Payer: Self-pay | Source: Home / Self Care | Attending: Orthopedic Surgery

## 2018-12-07 ENCOUNTER — Inpatient Hospital Stay (INDEPENDENT_AMBULATORY_CARE_PROVIDER_SITE_OTHER): Payer: Medicaid Other | Admitting: Physician Assistant

## 2018-12-07 ENCOUNTER — Encounter (HOSPITAL_COMMUNITY): Payer: Self-pay | Admitting: Certified Registered"

## 2018-12-07 DIAGNOSIS — F40240 Claustrophobia: Secondary | ICD-10-CM | POA: Diagnosis present

## 2018-12-07 DIAGNOSIS — Z7982 Long term (current) use of aspirin: Secondary | ICD-10-CM | POA: Diagnosis not present

## 2018-12-07 DIAGNOSIS — Z86718 Personal history of other venous thrombosis and embolism: Secondary | ICD-10-CM | POA: Diagnosis not present

## 2018-12-07 DIAGNOSIS — E1169 Type 2 diabetes mellitus with other specified complication: Secondary | ICD-10-CM | POA: Diagnosis present

## 2018-12-07 DIAGNOSIS — Z6841 Body Mass Index (BMI) 40.0 and over, adult: Secondary | ICD-10-CM | POA: Diagnosis not present

## 2018-12-07 DIAGNOSIS — Y835 Amputation of limb(s) as the cause of abnormal reaction of the patient, or of later complication, without mention of misadventure at the time of the procedure: Secondary | ICD-10-CM | POA: Diagnosis present

## 2018-12-07 DIAGNOSIS — E669 Obesity, unspecified: Secondary | ICD-10-CM | POA: Diagnosis present

## 2018-12-07 DIAGNOSIS — D62 Acute posthemorrhagic anemia: Secondary | ICD-10-CM | POA: Diagnosis present

## 2018-12-07 DIAGNOSIS — Z89512 Acquired absence of left leg below knee: Secondary | ICD-10-CM

## 2018-12-07 DIAGNOSIS — N189 Chronic kidney disease, unspecified: Secondary | ICD-10-CM | POA: Diagnosis present

## 2018-12-07 DIAGNOSIS — Z8673 Personal history of transient ischemic attack (TIA), and cerebral infarction without residual deficits: Secondary | ICD-10-CM | POA: Diagnosis not present

## 2018-12-07 DIAGNOSIS — I10 Essential (primary) hypertension: Secondary | ICD-10-CM | POA: Diagnosis present

## 2018-12-07 DIAGNOSIS — D638 Anemia in other chronic diseases classified elsewhere: Secondary | ICD-10-CM | POA: Diagnosis present

## 2018-12-07 DIAGNOSIS — F410 Panic disorder [episodic paroxysmal anxiety] without agoraphobia: Secondary | ICD-10-CM | POA: Diagnosis present

## 2018-12-07 DIAGNOSIS — K219 Gastro-esophageal reflux disease without esophagitis: Secondary | ICD-10-CM | POA: Diagnosis present

## 2018-12-07 DIAGNOSIS — T8781 Dehiscence of amputation stump: Principal | ICD-10-CM | POA: Diagnosis present

## 2018-12-07 DIAGNOSIS — E1151 Type 2 diabetes mellitus with diabetic peripheral angiopathy without gangrene: Secondary | ICD-10-CM | POA: Diagnosis present

## 2018-12-07 DIAGNOSIS — F419 Anxiety disorder, unspecified: Secondary | ICD-10-CM | POA: Diagnosis present

## 2018-12-07 DIAGNOSIS — M797 Fibromyalgia: Secondary | ICD-10-CM | POA: Diagnosis present

## 2018-12-07 DIAGNOSIS — I2510 Atherosclerotic heart disease of native coronary artery without angina pectoris: Secondary | ICD-10-CM | POA: Diagnosis present

## 2018-12-07 DIAGNOSIS — I129 Hypertensive chronic kidney disease with stage 1 through stage 4 chronic kidney disease, or unspecified chronic kidney disease: Secondary | ICD-10-CM | POA: Diagnosis present

## 2018-12-07 DIAGNOSIS — F4310 Post-traumatic stress disorder, unspecified: Secondary | ICD-10-CM | POA: Diagnosis present

## 2018-12-07 DIAGNOSIS — E1122 Type 2 diabetes mellitus with diabetic chronic kidney disease: Secondary | ICD-10-CM | POA: Diagnosis present

## 2018-12-07 HISTORY — DX: Presence of spectacles and contact lenses: Z97.3

## 2018-12-07 HISTORY — DX: Panic disorder (episodic paroxysmal anxiety): F41.0

## 2018-12-07 HISTORY — PX: STUMP REVISION: SHX6102

## 2018-12-07 HISTORY — DX: Peripheral vascular disease, unspecified: I73.9

## 2018-12-07 LAB — CBC
HCT: 24.4 % — ABNORMAL LOW (ref 39.0–52.0)
Hemoglobin: 6.9 g/dL — CL (ref 13.0–17.0)
MCH: 22.4 pg — ABNORMAL LOW (ref 26.0–34.0)
MCHC: 28.3 g/dL — ABNORMAL LOW (ref 30.0–36.0)
MCV: 79.2 fL — ABNORMAL LOW (ref 80.0–100.0)
Platelets: 501 10*3/uL — ABNORMAL HIGH (ref 150–400)
RBC: 3.08 MIL/uL — ABNORMAL LOW (ref 4.22–5.81)
RDW: 17.1 % — ABNORMAL HIGH (ref 11.5–15.5)
WBC: 11.2 10*3/uL — ABNORMAL HIGH (ref 4.0–10.5)
nRBC: 0.3 % — ABNORMAL HIGH (ref 0.0–0.2)

## 2018-12-07 LAB — APTT: aPTT: 29 seconds (ref 24–36)

## 2018-12-07 LAB — GLUCOSE, CAPILLARY
Glucose-Capillary: 269 mg/dL — ABNORMAL HIGH (ref 70–99)
Glucose-Capillary: 158 mg/dL — ABNORMAL HIGH (ref 70–99)
Glucose-Capillary: 228 mg/dL — ABNORMAL HIGH (ref 70–99)

## 2018-12-07 LAB — PROTIME-INR
INR: 1.1 (ref 0.8–1.2)
Prothrombin Time: 14.2 seconds (ref 11.4–15.2)

## 2018-12-07 SURGERY — REVISION, AMPUTATION SITE
Anesthesia: General | Site: Leg Lower | Laterality: Left

## 2018-12-07 MED ORDER — MAGNESIUM CITRATE PO SOLN: 1.0000 | Freq: Once | ORAL | Status: DC | PRN

## 2018-12-07 MED ORDER — LIDOCAINE 2% (20 MG/ML) 5 ML SYRINGE
INTRAMUSCULAR | Status: DC | PRN
  Administered 2018-12-07: 100 mg via INTRAVENOUS

## 2018-12-07 MED ORDER — MIDAZOLAM HCL 5 MG/5ML IJ SOLN
INTRAMUSCULAR | Status: DC | PRN
  Administered 2018-12-07: 2 mg via INTRAVENOUS

## 2018-12-07 MED ORDER — 0.9 % SODIUM CHLORIDE (POUR BTL) OPTIME
TOPICAL | Status: DC | PRN
  Administered 2018-12-07: 1000 mL

## 2018-12-07 MED ORDER — SODIUM CHLORIDE 0.9% FLUSH
10.0000 mL | INTRAVENOUS | Status: DC | PRN
  Administered 2018-12-11 (×2): 10 mL
  Filled 2018-12-07 (×2): qty 40

## 2018-12-07 MED ORDER — INSULIN DETEMIR 100 UNIT/ML ~~LOC~~ SOLN
70.0000 [IU] | Freq: Every day | SUBCUTANEOUS | Status: DC
  Administered 2018-12-07 – 2018-12-08 (×2): 70 [IU] via SUBCUTANEOUS
  Filled 2018-12-07 (×5): qty 0.7

## 2018-12-07 MED ORDER — METOCLOPRAMIDE HCL 5 MG/ML IJ SOLN: 5.0000 mg | Freq: Three times a day (TID) | INTRAMUSCULAR | Status: DC | PRN

## 2018-12-07 MED ORDER — ACETAMINOPHEN 325 MG PO TABS: 325.0000 mg | ORAL_TABLET | ORAL | Status: DC | PRN

## 2018-12-07 MED ORDER — FENTANYL CITRATE (PF) 100 MCG/2ML IJ SOLN
INTRAMUSCULAR | Status: DC | PRN
  Administered 2018-12-07: 150 ug via INTRAVENOUS
  Administered 2018-12-07: 100 ug via INTRAVENOUS

## 2018-12-07 MED ORDER — FENTANYL CITRATE (PF) 100 MCG/2ML IJ SOLN
25.0000 ug | INTRAMUSCULAR | Status: DC | PRN
  Administered 2018-12-07: 50 ug via INTRAVENOUS

## 2018-12-07 MED ORDER — GABAPENTIN 300 MG PO CAPS
300.0000 mg | ORAL_CAPSULE | Freq: Three times a day (TID) | ORAL | Status: DC
  Administered 2018-12-07 – 2018-12-11 (×11): 300 mg via ORAL
  Filled 2018-12-07 (×11): qty 1

## 2018-12-07 MED ORDER — PROPOFOL 10 MG/ML IV BOLUS
INTRAVENOUS | Status: AC
  Filled 2018-12-07: qty 20

## 2018-12-07 MED ORDER — ONDANSETRON HCL 4 MG/2ML IJ SOLN: 4.0000 mg | Freq: Four times a day (QID) | INTRAMUSCULAR | Status: DC | PRN

## 2018-12-07 MED ORDER — CITALOPRAM HYDROBROMIDE 20 MG PO TABS
30.0000 mg | ORAL_TABLET | Freq: Every day | ORAL | Status: DC
  Administered 2018-12-08 – 2018-12-11 (×4): 30 mg via ORAL
  Filled 2018-12-07 (×4): qty 2

## 2018-12-07 MED ORDER — SODIUM CHLORIDE 0.9 % IV SOLN
1.0000 g | INTRAVENOUS | Status: DC
  Administered 2018-12-07 – 2018-12-11 (×4): 1000 mg via INTRAVENOUS
  Filled 2018-12-07 (×7): qty 1

## 2018-12-07 MED ORDER — ALPRAZOLAM 0.5 MG PO TABS
1.0000 mg | ORAL_TABLET | Freq: Every day | ORAL | Status: DC | PRN
  Administered 2018-12-10: 17:00:00 1 mg via ORAL
  Filled 2018-12-07: qty 2

## 2018-12-07 MED ORDER — MIDAZOLAM HCL 2 MG/2ML IJ SOLN
INTRAMUSCULAR | Status: AC
  Filled 2018-12-07: qty 2

## 2018-12-07 MED ORDER — FENTANYL CITRATE (PF) 100 MCG/2ML IJ SOLN
INTRAMUSCULAR | Status: AC
  Filled 2018-12-07: qty 2

## 2018-12-07 MED ORDER — CHLORHEXIDINE GLUCONATE 4 % EX LIQD: 60.0000 mL | Freq: Once | CUTANEOUS | Status: DC

## 2018-12-07 MED ORDER — SUCCINYLCHOLINE CHLORIDE 200 MG/10ML IV SOSY
PREFILLED_SYRINGE | INTRAVENOUS | Status: DC | PRN
  Administered 2018-12-07: 180 mg via INTRAVENOUS

## 2018-12-07 MED ORDER — ACETAMINOPHEN 325 MG PO TABS
325.0000 mg | ORAL_TABLET | Freq: Four times a day (QID) | ORAL | Status: DC | PRN
  Administered 2018-12-07: 650 mg via ORAL
  Filled 2018-12-07: qty 2

## 2018-12-07 MED ORDER — ERTAPENEM IV (FOR PTA / DISCHARGE USE ONLY): 1.0000 g | INTRAVENOUS | Status: DC

## 2018-12-07 MED ORDER — MEPERIDINE HCL 50 MG/ML IJ SOLN: 6.2500 mg | INTRAMUSCULAR | Status: DC | PRN

## 2018-12-07 MED ORDER — OXYCODONE HCL 5 MG/5ML PO SOLN: 5.0000 mg | Freq: Once | ORAL | Status: DC | PRN

## 2018-12-07 MED ORDER — SACCHAROMYCES BOULARDII 250 MG PO CAPS
250.0000 mg | ORAL_CAPSULE | Freq: Two times a day (BID) | ORAL | Status: DC
  Administered 2018-12-07 – 2018-12-11 (×8): 250 mg via ORAL
  Filled 2018-12-07 (×8): qty 1

## 2018-12-07 MED ORDER — INSULIN ASPART 100 UNIT/ML ~~LOC~~ SOLN
0.0000 [IU] | Freq: Three times a day (TID) | SUBCUTANEOUS | Status: DC
  Administered 2018-12-08: 12:00:00 11 [IU] via SUBCUTANEOUS
  Administered 2018-12-08: 17:00:00 15 [IU] via SUBCUTANEOUS
  Administered 2018-12-08 – 2018-12-09 (×2): 7 [IU] via SUBCUTANEOUS
  Administered 2018-12-09 (×2): 11 [IU] via SUBCUTANEOUS
  Administered 2018-12-10: 3 [IU] via SUBCUTANEOUS
  Administered 2018-12-10: 7 [IU] via SUBCUTANEOUS
  Administered 2018-12-10 – 2018-12-11 (×2): 11 [IU] via SUBCUTANEOUS

## 2018-12-07 MED ORDER — PRAVASTATIN SODIUM 10 MG PO TABS
20.0000 mg | ORAL_TABLET | Freq: Every day | ORAL | Status: DC
  Administered 2018-12-08 – 2018-12-10 (×3): 20 mg via ORAL
  Filled 2018-12-07 (×3): qty 2

## 2018-12-07 MED ORDER — AMLODIPINE BESYLATE 5 MG PO TABS
5.0000 mg | ORAL_TABLET | Freq: Every day | ORAL | Status: DC
  Administered 2018-12-08 – 2018-12-11 (×4): 5 mg via ORAL
  Filled 2018-12-07 (×4): qty 1

## 2018-12-07 MED ORDER — METHOCARBAMOL 1000 MG/10ML IJ SOLN
500.0000 mg | Freq: Four times a day (QID) | INTRAVENOUS | Status: DC | PRN
  Filled 2018-12-07: qty 5

## 2018-12-07 MED ORDER — METHOCARBAMOL 500 MG PO TABS
500.0000 mg | ORAL_TABLET | Freq: Four times a day (QID) | ORAL | Status: DC | PRN
  Administered 2018-12-07 – 2018-12-10 (×8): 500 mg via ORAL
  Filled 2018-12-07 (×8): qty 1

## 2018-12-07 MED ORDER — ONDANSETRON HCL 4 MG/2ML IJ SOLN: 4.0000 mg | Freq: Once | INTRAMUSCULAR | Status: DC | PRN

## 2018-12-07 MED ORDER — GEMFIBROZIL 600 MG PO TABS
600.0000 mg | ORAL_TABLET | Freq: Two times a day (BID) | ORAL | Status: DC
  Administered 2018-12-08 – 2018-12-11 (×7): 600 mg via ORAL
  Filled 2018-12-07 (×8): qty 1

## 2018-12-07 MED ORDER — METHOCARBAMOL 500 MG PO TABS
ORAL_TABLET | ORAL | Status: AC
  Filled 2018-12-07: qty 1

## 2018-12-07 MED ORDER — INSULIN ASPART 100 UNIT/ML ~~LOC~~ SOLN
6.0000 [IU] | Freq: Three times a day (TID) | SUBCUTANEOUS | Status: DC
  Administered 2018-12-08 – 2018-12-09 (×6): 6 [IU] via SUBCUTANEOUS

## 2018-12-07 MED ORDER — LACTATED RINGERS IV SOLN
INTRAVENOUS | Status: DC
  Administered 2018-12-07: 13:00:00 via INTRAVENOUS

## 2018-12-07 MED ORDER — OXYCODONE HCL 5 MG PO TABS: 5.0000 mg | ORAL_TABLET | Freq: Once | ORAL | Status: DC | PRN

## 2018-12-07 MED ORDER — ACETAMINOPHEN 160 MG/5ML PO SOLN: 325.0000 mg | ORAL | Status: DC | PRN

## 2018-12-07 MED ORDER — HYDRALAZINE HCL 25 MG PO TABS
25.0000 mg | ORAL_TABLET | Freq: Three times a day (TID) | ORAL | Status: DC
  Administered 2018-12-07 – 2018-12-11 (×11): 25 mg via ORAL
  Filled 2018-12-07 (×11): qty 1

## 2018-12-07 MED ORDER — TRANEXAMIC ACID-NACL 1000-0.7 MG/100ML-% IV SOLN
INTRAVENOUS | Status: AC
  Filled 2018-12-07: qty 100

## 2018-12-07 MED ORDER — HYDROXYZINE HCL 10 MG PO TABS: 10.0000 mg | ORAL_TABLET | Freq: Three times a day (TID) | ORAL | Status: DC

## 2018-12-07 MED ORDER — OXYCODONE HCL 5 MG PO TABS
ORAL_TABLET | ORAL | Status: AC
  Filled 2018-12-07: qty 3

## 2018-12-07 MED ORDER — BISACODYL 10 MG RE SUPP: 10.0000 mg | Freq: Every day | RECTAL | Status: DC | PRN

## 2018-12-07 MED ORDER — FENTANYL CITRATE (PF) 250 MCG/5ML IJ SOLN
INTRAMUSCULAR | Status: AC
  Filled 2018-12-07: qty 5

## 2018-12-07 MED ORDER — OXYCODONE HCL 5 MG PO TABS
10.0000 mg | ORAL_TABLET | ORAL | Status: DC | PRN
  Administered 2018-12-07: 10 mg via ORAL
  Administered 2018-12-07: 15 mg via ORAL
  Administered 2018-12-08 – 2018-12-10 (×9): 10 mg via ORAL
  Administered 2018-12-11: 15 mg via ORAL
  Filled 2018-12-07 (×2): qty 2
  Filled 2018-12-07: qty 3
  Filled 2018-12-07 (×4): qty 2

## 2018-12-07 MED ORDER — POLYETHYLENE GLYCOL 3350 17 G PO PACK
17.0000 g | PACK | Freq: Every day | ORAL | Status: DC | PRN
  Administered 2018-12-07 – 2018-12-09 (×3): 17 g via ORAL
  Filled 2018-12-07 (×4): qty 1

## 2018-12-07 MED ORDER — SODIUM CHLORIDE 0.9 % IV SOLN: INTRAVENOUS | Status: DC

## 2018-12-07 MED ORDER — METOCLOPRAMIDE HCL 5 MG PO TABS: 5.0000 mg | ORAL_TABLET | Freq: Three times a day (TID) | ORAL | Status: DC | PRN

## 2018-12-07 MED ORDER — OXYCODONE HCL 5 MG PO TABS
5.0000 mg | ORAL_TABLET | ORAL | Status: DC | PRN
  Administered 2018-12-07 – 2018-12-10 (×2): 10 mg via ORAL
  Filled 2018-12-07 (×7): qty 2

## 2018-12-07 MED ORDER — DOCUSATE SODIUM 100 MG PO CAPS
100.0000 mg | ORAL_CAPSULE | Freq: Two times a day (BID) | ORAL | Status: DC
  Administered 2018-12-07 – 2018-12-11 (×8): 100 mg via ORAL
  Filled 2018-12-07 (×8): qty 1

## 2018-12-07 MED ORDER — PROPOFOL 10 MG/ML IV BOLUS
INTRAVENOUS | Status: DC | PRN
  Administered 2018-12-07: 200 mg via INTRAVENOUS

## 2018-12-07 MED ORDER — ONDANSETRON HCL 4 MG/2ML IJ SOLN
INTRAMUSCULAR | Status: DC | PRN
  Administered 2018-12-07: 4 mg via INTRAVENOUS

## 2018-12-07 MED ORDER — ONDANSETRON HCL 4 MG PO TABS: 4.0000 mg | ORAL_TABLET | Freq: Four times a day (QID) | ORAL | Status: DC | PRN

## 2018-12-07 MED ORDER — METOPROLOL TARTRATE 25 MG PO TABS
75.0000 mg | ORAL_TABLET | Freq: Two times a day (BID) | ORAL | Status: DC
  Administered 2018-12-07 – 2018-12-11 (×8): 75 mg via ORAL
  Filled 2018-12-07 (×8): qty 3

## 2018-12-07 MED ORDER — HYDROMORPHONE HCL 1 MG/ML IJ SOLN: 0.5000 mg | INTRAMUSCULAR | Status: DC | PRN

## 2018-12-07 MED ORDER — TRAZODONE HCL 50 MG PO TABS
25.0000 mg | ORAL_TABLET | Freq: Every evening | ORAL | Status: DC | PRN
  Administered 2018-12-10: 21:00:00 50 mg via ORAL
  Filled 2018-12-07: qty 1

## 2018-12-07 SURGICAL SUPPLY — 34 items
BLADE SAW RECIP 87.9 MT (BLADE) IMPLANT
BLADE SURG 21 STRL SS (BLADE) ×3 IMPLANT
CANISTER WOUND CARE 500ML ATS (WOUND CARE) ×3 IMPLANT
COVER SURGICAL LIGHT HANDLE (MISCELLANEOUS) ×3 IMPLANT
COVER WAND RF STERILE (DRAPES) IMPLANT
DRAPE EXTREMITY T 121X128X90 (DISPOSABLE) ×3 IMPLANT
DRAPE EXTREMITY TIBURON (DRAPES) ×3 IMPLANT
DRAPE HALF SHEET 40X57 (DRAPES) ×3 IMPLANT
DRAPE INCISE IOBAN 66X45 STRL (DRAPES) ×6 IMPLANT
DRAPE U-SHAPE 47X51 STRL (DRAPES) IMPLANT
DRESSING PREVENA PLUS CUSTOM (GAUZE/BANDAGES/DRESSINGS) ×1 IMPLANT
DRSG PREVENA PLUS CUSTOM (GAUZE/BANDAGES/DRESSINGS) ×3
DURAPREP 26ML APPLICATOR (WOUND CARE) ×3 IMPLANT
ELECT REM PT RETURN 9FT ADLT (ELECTROSURGICAL) ×3
ELECTRODE REM PT RTRN 9FT ADLT (ELECTROSURGICAL) ×1 IMPLANT
GLOVE BIOGEL PI IND STRL 9 (GLOVE) ×1 IMPLANT
GLOVE BIOGEL PI INDICATOR 9 (GLOVE) ×2
GLOVE SURG ORTHO 9.0 STRL STRW (GLOVE) ×6 IMPLANT
GOWN STRL REUS W/ TWL XL LVL3 (GOWN DISPOSABLE) ×2 IMPLANT
GOWN STRL REUS W/TWL XL LVL3 (GOWN DISPOSABLE) ×4
KIT BASIN OR (CUSTOM PROCEDURE TRAY) ×3 IMPLANT
KIT TURNOVER KIT B (KITS) ×3 IMPLANT
MANIFOLD NEPTUNE II (INSTRUMENTS) ×3 IMPLANT
NS IRRIG 1000ML POUR BTL (IV SOLUTION) ×3 IMPLANT
PACK GENERAL/GYN (CUSTOM PROCEDURE TRAY) ×3 IMPLANT
PAD ARMBOARD 7.5X6 YLW CONV (MISCELLANEOUS) ×3 IMPLANT
PAD NEG PRESSURE SENSATRAC (MISCELLANEOUS) ×3 IMPLANT
PREVENA RESTOR ARTHOFORM 46X30 (CANNISTER) ×3 IMPLANT
SPONGE LAP 18X18 X RAY DECT (DISPOSABLE) ×3 IMPLANT
STAPLER VISISTAT 35W (STAPLE) IMPLANT
SUT ETHILON 2 0 PSLX (SUTURE) ×12 IMPLANT
SUT SILK 2 0 (SUTURE)
SUT SILK 2-0 18XBRD TIE 12 (SUTURE) IMPLANT
TOWEL OR 17X26 10 PK STRL BLUE (TOWEL DISPOSABLE) ×3 IMPLANT

## 2018-12-07 NOTE — Anesthesia Procedure Notes (Signed)
Procedure Name: Intubation Date/Time: 12/07/2018 4:22 PM Performed by: Moshe Salisbury, CRNA Pre-anesthesia Checklist: Patient identified, Emergency Drugs available, Suction available and Patient being monitored Patient Re-evaluated:Patient Re-evaluated prior to induction Oxygen Delivery Method: Circle System Utilized Preoxygenation: Pre-oxygenation with 100% oxygen Induction Type: IV induction and Rapid sequence Laryngoscope Size: Mac and 4 Grade View: Grade II Tube type: Oral Tube size: 8.0 mm Number of attempts: 1 Airway Equipment and Method: Stylet Placement Confirmation: ETT inserted through vocal cords under direct vision,  positive ETCO2 and breath sounds checked- equal and bilateral Secured at: 22 cm Tube secured with: Tape Dental Injury: Teeth and Oropharynx as per pre-operative assessment

## 2018-12-07 NOTE — Plan of Care (Signed)
  Problem: Pain Management: Goal: Pain level will decrease with appropriate interventions Outcome: Progressing   

## 2018-12-07 NOTE — Transfer of Care (Signed)
Immediate Anesthesia Transfer of Care Note  Patient: Kristopher Simon  Procedure(s) Performed: REVISION LEFT BELOW KNEE AMPUTATION (Left Leg Lower)  Patient Location: PACU  Anesthesia Type:General  Level of Consciousness: awake and patient cooperative  Airway & Oxygen Therapy: Patient Spontanous Breathing and Patient connected to nasal cannula oxygen  Post-op Assessment: Report given to RN, Post -op Vital signs reviewed and stable and Patient moving all extremities  Post vital signs: Reviewed and stable  Last Vitals:  Vitals Value Taken Time  BP 131/78 12/07/2018  5:14 PM  Temp    Pulse 90 12/07/2018  5:15 PM  Resp 9 12/07/2018  5:15 PM  SpO2 100 % 12/07/2018  5:15 PM  Vitals shown include unvalidated device data.  Last Pain:  Vitals:   12/07/18 1250  TempSrc: Oral         Complications: No apparent anesthesia complications

## 2018-12-07 NOTE — Anesthesia Postprocedure Evaluation (Signed)
Anesthesia Post Note  Patient: Kristopher Simon  Procedure(s) Performed: REVISION LEFT BELOW KNEE AMPUTATION (Left Leg Lower)     Patient location during evaluation: PACU Anesthesia Type: General Level of consciousness: awake Pain management: pain level controlled Vital Signs Assessment: post-procedure vital signs reviewed and stable Respiratory status: spontaneous breathing Cardiovascular status: stable Postop Assessment: no apparent nausea or vomiting Anesthetic complications: no    Last Vitals:  Vitals:   12/07/18 1250 12/07/18 1715  BP: (!) 177/81   Pulse: 96   Resp: 18   Temp: 37.1 C 36.6 C  SpO2: 100%     Last Pain:  Vitals:   12/07/18 1715  TempSrc:   PainSc: 0-No pain                 Emonii Wienke

## 2018-12-07 NOTE — Plan of Care (Signed)
  Problem: Activity: Goal: Ability to perform//tolerate increased activity and mobilize with assistive devices will improve 12/07/2018 1852 by Mariane Baumgarten, RN Outcome: Progressing 12/07/2018 1847 by Mariane Baumgarten, RN Outcome: Progressing

## 2018-12-07 NOTE — H&P (Signed)
Kristopher Simon is an 53 y.o. male.   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  He had the [facility] on postoperatively but has had continued hematoma and bloody drainage over the amputation site.  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  We will also use some transexamic acid both topically and intravenously in the perioperative period to try to help control some of his bleeding.  He is chronically on aspirin and Plavix due to carotid vascular disease. Past Medical History:  Diagnosis Date  . Acute renal failure ([place]) 01/2012   CKD  . Anxiety   . Arthritis    hands  . Cellulitis    hx left leg   . Cellulitis of left lower extremity 07/06/2014  . Claustrophobia   . Concussion   . Coronary artery disease   . Depression   . Diabetes mellitus 1995   Type II  . DVT (deep venous thrombosis) ([place]) 2014   left leg  . Fibromyalgia   . Foot abscess, left 09/12/2018  . GERD (gastroesophageal reflux disease)   . Hypertension 1998  . Neuropathy    feet- below knee  . Panic attacks   . Peripheral vascular disease ([facility])    left leg, right neck  . PTSD (post-traumatic stress disorder)   . Stroke ([place])    x 3  memory loss - left hand- and left lower extremity-shakes at times- closes by it self- 06/2017- last one  . Wears glasses    blind in left eye, readers    Past Surgical History:  Procedure Laterality Date  . AMPUTATION Left 09/14/2018   Procedure: LEFT BELOW KNEE AMPUTATION;  Surgeon: Newt Minion, MD;  Location: [place];  Service: Orthopedics;  Laterality: Left;  . CARDIAC CATHETERIZATION  2009   stent  . COLONOSCOPY    . EYE SURGERY Left    repair torn retina - blind in left eye  . HIP FRACTURE SURGERY Right 2014   metal in hip  . STUMP REVISION Left 10/19/2018  . STUMP REVISION Left  10/19/2018   Procedure: REVISION LEFT BELOW KNEE AMPUTATION;  Surgeon: Newt Minion, MD;  Location: [place];  Service: Orthopedics;  Laterality: Left;  . STUMP REVISION Left 11/30/2018   Procedure: REVISION LEFT BELOW KNEE AMPUTATION;  Surgeon: Newt Minion, MD;  Location: [place];  Service: Orthopedics;  Laterality: Left;  REVISION LEFT BELOW KNEE AMPUTATION  . TONSILLECTOMY    . WISDOM TOOTH EXTRACTION      Family History  Problem Relation Age of Onset  . Hypertension Mother   . Dementia Father   . Alzheimer's disease Father    Social History:  reports that he has never smoked. He has never used smokeless tobacco. He reports previous alcohol use of about 1.0 standard drinks of alcohol per week. He reports that he does not use drugs.  Allergies:  Allergies  Allergen Reactions  . Sulfamethoxazole-Trimethoprim Anaphylaxis and Swelling     (BACTRIM) tongue swells  . Pregabalin Swelling    SWELLING REACTION UNSPECIFIED     No medications prior to admission.    No results found for this or any previous visit (from the past 48 hour(s)). No results found.  Review of Systems  All other systems reviewed and are negative.   Height 6\' 5"  (1.956 m), weight Marland Kitchen)  174.6 kg. Physical Exam  Constitutional: He is oriented to person, place, and time. He appears well-developed and well-nourished. No distress.  Neck: No tracheal deviation present. No thyromegaly present.  Cardiovascular: Normal rate.  Respiratory: Effort normal. No stridor. No respiratory distress.  GI: Soft. He exhibits no distension.  Musculoskeletal:     Comments: Left transtibial amputation site-he has a large amount of clot within the distal limb as well as draining from the incisional area.  There is a large amount of swelling.  He does have a fever 99.9 but reports he is otherwise feeling well.  Neurological: He is alert and oriented to person, place, and time. No cranial nerve deficit. Coordination normal.  Skin: Skin  is warm.  Psychiatric: He has a normal mood and affect. His behavior is normal. Judgment and thought content normal.     Assessment/Plan Hematoma of left transtibial amputation-plan left transtibial amputation revision-the procedure and possible benefits and risk were discussed with the patient including the risk of bleeding, infection, neurovascular injury, and possible need for further surgery were discussed with the patient and the patient's questions answered to his satisfaction.  The patient wishes to proceed with surgery at this time.  Erlinda Hong, PA-C 12/07/2018, 7:20 AM [facility] [phone]

## 2018-12-07 NOTE — Anesthesia Preprocedure Evaluation (Signed)
Anesthesia Evaluation  Patient identified by MRN, date of birth, ID band Patient awake    Reviewed: Allergy & Precautions, H&P , NPO status , Patient's Chart, lab work & pertinent test results  Airway Mallampati: II   Neck ROM: full    Dental no notable dental hx. (+) Teeth Intact   Pulmonary shortness of breath,    Pulmonary exam normal breath sounds clear to auscultation       Cardiovascular hypertension, Pt. on medications and Pt. on home beta blockers  Rhythm:regular Rate:Normal  TTE (08/2018): EF 55%, normal valves   Neuro/Psych PSYCHIATRIC DISORDERS Anxiety Depression  Neuromuscular disease CVA    GI/Hepatic GERD  Medicated and Controlled,  Endo/Other  diabetes, Type 2, Insulin DependentMorbid obesity  Renal/GU Renal InsufficiencyRenal disease     Musculoskeletal  (+) Arthritis , Fibromyalgia -  Abdominal (+) + obese,   Peds  Hematology   Anesthesia Other Findings   Reproductive/Obstetrics                             Anesthesia Physical  Anesthesia Plan  ASA: III  Anesthesia Plan: General   Post-op Pain Management:    Induction: Intravenous  PONV Risk Score and Plan: 2 and Ondansetron, Dexamethasone, Midazolam and Treatment may vary due to age or medical condition  Airway Management Planned: Oral ETT  Additional Equipment:   Intra-op Plan:   Post-operative Plan: Extubation in OR  Informed Consent: I have reviewed the patients History and Physical, chart, labs and discussed the procedure including the risks, benefits and alternatives for the proposed anesthesia with the patient or authorized representative who has indicated his/her understanding and acceptance.       Plan Discussed with: CRNA, Anesthesiologist and Surgeon  Anesthesia Plan Comments:         Anesthesia Quick Evaluation

## 2018-12-07 NOTE — Op Note (Signed)
12/07/2018  5:22 PM  PATIENT:  Kristopher Simon    PRE-OPERATIVE DIAGNOSIS:  Dehiscence Left Below Knee Amputation secondary to recent fall  POST-OPERATIVE DIAGNOSIS:  Same  PROCEDURE:  REVISION LEFT BELOW KNEE AMPUTATION Application of Praveena restore and customizable VAC  SURGEON:  Newt Minion, MD  PHYSICIAN ASSISTANT:None ANESTHESIA:   General  PREOPERATIVE INDICATIONS:  [name] is a  53 y.o. male with a diagnosis of Dehiscence Left Below Knee Amputation who failed conservative measures and elected for surgical management.    The risks benefits and alternatives were discussed with the patient preoperatively including but not limited to the risks of infection, bleeding, nerve injury, cardiopulmonary complications, the need for revision surgery, among others, and the patient was willing to proceed.  OPERATIVE IMPLANTS: Restore and customizable VAC.  Patient received IV and topical TXA.  @ENCIMAGES @  OPERATIVE FINDINGS: Patient had oozing bleeding from the soft tissue with a large hematoma secondary to a fall and secondary to his use of Plavix and aspirin.  OPERATIVE PROCEDURE: Patient was brought the operating room underwent general anesthetic.  After adequate levels anesthesia were obtained patient's left lower extremity was prepped using ChloraPrep and draped into a sterile field a timeout was called.  A elliptical incision was made around the dehisced wound.  There is a large hematoma which was evacuated.  There was ischemic changes to the muscle and soft tissue adjacent to the hematoma and a 10 blade knife was used to resect the nonviable soft tissue electrocautery was used for hemostasis.  The tibia was transected 1 cm and beveled anteriorly.  Hemostasis was then obtained after irrigation and the wound was packed with TXA on a sponge.  The soft tissue was clean and dry and the skin was closed using 2-0 nylon.  A customizable and restore Praveena dressing were applied this had a  good suction fit patient was extubated taken to PACU in stable condition.   DISCHARGE PLANNING:  Antibiotic duration: We will continue patient's current IV antibiotics  Weightbearing: Nonweightbearing on the left  Pain medication: Opioid pathway ordered  Dressing care/ Wound VAC: Continue wound VAC for 1 week  Ambulatory devices: Walker  Discharge to: Anticipate discharge to home early next week when the [facility] drainage has slowed.  Follow-up: In the office 1 week post operative.

## 2018-12-08 ENCOUNTER — Encounter (HOSPITAL_COMMUNITY): Payer: Self-pay | Admitting: Orthopedic Surgery

## 2018-12-08 LAB — GLUCOSE, CAPILLARY
Glucose-Capillary: 211 mg/dL — ABNORMAL HIGH (ref 70–99)
Glucose-Capillary: 265 mg/dL — ABNORMAL HIGH (ref 70–99)
Glucose-Capillary: 309 mg/dL — ABNORMAL HIGH (ref 70–99)
Glucose-Capillary: 259 mg/dL — ABNORMAL HIGH (ref 70–99)

## 2018-12-08 LAB — PREPARE RBC (CROSSMATCH)

## 2018-12-08 LAB — HEMOGLOBIN AND HEMATOCRIT, BLOOD
HCT: 21 % — ABNORMAL LOW (ref 39.0–52.0)
Hemoglobin: 6.1 g/dL — CL (ref 13.0–17.0)

## 2018-12-08 MED ORDER — SODIUM CHLORIDE 0.9% IV SOLUTION
Freq: Once | INTRAVENOUS | Status: AC
  Administered 2018-12-08: 09:00:00 via INTRAVENOUS

## 2018-12-08 NOTE — Plan of Care (Signed)
  Problem: Education: Goal: Knowledge of the prescribed therapeutic regimen will improve Outcome: Progressing   Problem: Pain Management: Goal: Pain level will decrease with appropriate interventions Outcome: Progressing   Problem: Clinical Measurements: Goal: Ability to maintain clinical measurements within normal limits will improve Outcome: Progressing   Problem: Nutrition: Goal: Adequate nutrition will be maintained Outcome: Progressing   Problem: Coping: Goal: Level of anxiety will decrease Outcome: Progressing   Problem: Elimination: Goal: Will not experience complications related to bowel motility Outcome: Progressing

## 2018-12-08 NOTE — Progress Notes (Signed)
Inpatient Diabetes Program Recommendations  AACE/ADA: New Consensus Statement on Inpatient Glycemic Control (2015)  Target Ranges:  Prepandial:   less than 140 mg/dL      Peak postprandial:   less than 180 mg/dL (1-2 hours)      Critically ill patients:  140 - 180 mg/dL   Lab Results  Component Value Date   GLUCAP 309 (H) 12/08/2018   HGBA1C 10.1 (H) 09/13/2018    Review of Glycemic Control  Diabetes history: DM2 Outpatient Diabetes medications: Tresiba 77 units QHS, Novolog 30 units tidwc, Victoza 1.8 mg QD Current orders for Inpatient glycemic control: Levemir 70 units QHS, Novolog 0-20 units tidwc + 6 units tidwc  HgbA1C - 10.1% - uncontrolled  Inpatient Diabetes Program Recommendations:     Increase Levemir to 77 units QHS Increase Novolog to 12 units tidwc Add HS correction  Will continue to follow.  Thank you. Lorenda Peck, RD, LDN, CDE Inpatient Diabetes Coordinator [phone]

## 2018-12-08 NOTE — Progress Notes (Signed)
Patient ID: Kristopher Simon, male   DOB: 01-09-1966, 53 y.o.   MRN: [id number] Postoperative day 1 revision left below the knee amputation.  Patient's hemoglobin is dropped from 8-6.9.  Orders are written for type and cross and transfuse with 2 units of packed red blood cells.  There is no drainage in the wound VAC canister.  Anticipate discharge on Monday with patient continuing his IV antibiotics with a PICC line.

## 2018-12-08 NOTE — Progress Notes (Signed)
Patient received 1 unit of blood today.  Post blood H&H yielded Hgb 6.1.  Nurse notified blood bank of the need of the 2nd unit ordered.  Will notify oncoming PM nurse.  Will continue to monitor.

## 2018-12-08 NOTE — Evaluation (Signed)
Physical Therapy Evaluation Patient Details Name: Kristopher Simon MRN: [id number] DOB: [birth year] Today's Date: 12/08/2018   History of Present Illness  Pt is a 53 y/o male s/p L transtibial amputation revision with wound VAC placement. PMH including but not limited to CAD, HTN, DM, stroke and anxiety.    Clinical Impression  Pt presented supine in bed with HOB elevated, awake and willing to participate in therapy session. Prior to admission, pt reported that he was mod I for all functional mobility and ADLs. Pt lives with his mother in a single level home with a ramped entrance. At the time of evaluation, pt at mod I for bed mobility. He declined any transfers or OOB mobility at this time due to feeling very tired from not sleeping last night. Pt reported that later today he would like to transfer to chair with assistance from nursing staff. Pt also stated that he does not feel he needs any f/u PT services while admitted or after d/c. No further acute PT services indicated at this time. PT signing off.     Follow Up Recommendations No PT follow up;Supervision/Assistance - 24 hour    Equipment Recommendations  None recommended by PT    Recommendations for Other Services       Precautions / Restrictions Precautions Precautions: Fall Precaution Comments: wound vac Restrictions Weight Bearing Restrictions: Yes LLE Weight Bearing: Non weight bearing      Mobility  Bed Mobility Overal bed mobility: Modified Independent             General bed mobility comments: use of bed rails with bilateral UEs  Transfers                 General transfer comment: pt deferred secondary to needing to rest/sleep; pt reported that he would be able to transfer (with help of staff) to chair later today  Ambulation/Gait                Stairs            Wheelchair Mobility    Modified Rankin (Stroke Patients Only)       Balance Overall balance assessment: Needs  assistance Sitting-balance support: Feet supported Sitting balance-Leahy Scale: Good                                       Pertinent Vitals/Pain Pain Assessment: Faces Faces Pain Scale: Hurts little more Pain Location: L residual limb Pain Descriptors / Indicators: Throbbing Pain Intervention(s): Monitored during session;Repositioned    Home Living Family/patient expects to be discharged to:: Private residence Living Arrangements: Parent Available Help at Discharge: Family;Available 24 hours/day Type of Home: House Home Access: Ramped entrance     Home Layout: One level Home Equipment: Bedside commode;Walker - 2 wheels;Wheelchair - Brewing technologist;Tub bench      Prior Function Level of Independence: Independent with assistive device(s)         Comments: primarily using w/c for mobility     Hand Dominance        Extremity/Trunk Assessment   Upper Extremity Assessment Upper Extremity Assessment: Overall WFL for tasks assessed    Lower Extremity Assessment Lower Extremity Assessment: LLE deficits/detail LLE Deficits / Details: wound VAC in place; pt with decreased strength and AROM limitations secondary to post-op pain and weakness; pt unable to achieve full knee extension LLE: Unable to fully assess due to pain  Communication   Communication: No difficulties  Cognition Arousal/Alertness: Awake/alert Behavior During Therapy: WFL for tasks assessed/performed Overall Cognitive Status: Within Functional Limits for tasks assessed                                        General Comments      Exercises Other Exercises Other Exercises: discussed importance of active knee ROM, specifically extension to allow for future planning of LE prosthesis   Assessment/Plan    PT Assessment Patent does not need any further PT services  PT Problem List         PT Treatment Interventions      PT Goals (Current goals can be found  in the Care Plan section)  Acute Rehab PT Goals Patient Stated Goal: for his leg to heal PT Goal Formulation: All assessment and education complete, DC therapy    Frequency     Barriers to discharge        Co-evaluation               AM-PAC PT "6 Clicks" Mobility  Outcome Measure Help needed turning from your back to your side while in a flat bed without using bedrails?: None Help needed moving from lying on your back to sitting on the side of a flat bed without using bedrails?: None Help needed moving to and from a bed to a chair (including a wheelchair)?: A Little Help needed standing up from a chair using your arms (e.g., wheelchair or bedside chair)?: Total Help needed to walk in hospital room?: Total Help needed climbing 3-5 steps with a railing? : Total 6 Click Score: 14    End of Session   Activity Tolerance: Patient tolerated treatment well Patient left: in bed;with call bell/phone within reach Nurse Communication: Mobility status PT Visit Diagnosis: Other abnormalities of gait and mobility (R26.89)    Time: 1694-5038 PT Time Calculation (min) (ACUTE ONLY): 18 min   Charges:   PT Evaluation $PT Eval Low Complexity: [place], PT, DPT  Acute Rehabilitation Services Pager [phone] Office [place] 12/08/2018, 12:53 PM

## 2018-12-09 LAB — CBC
HCT: 22.4 % — ABNORMAL LOW (ref 39.0–52.0)
Hemoglobin: 6.6 g/dL — CL (ref 13.0–17.0)
MCH: 23.7 pg — ABNORMAL LOW (ref 26.0–34.0)
MCHC: 29.5 g/dL — ABNORMAL LOW (ref 30.0–36.0)
MCV: 80.3 fL (ref 80.0–100.0)
Platelets: 436 10*3/uL — ABNORMAL HIGH (ref 150–400)
RBC: 2.79 MIL/uL — ABNORMAL LOW (ref 4.22–5.81)
RDW: 17.3 % — ABNORMAL HIGH (ref 11.5–15.5)
WBC: 12.3 10*3/uL — ABNORMAL HIGH (ref 4.0–10.5)
nRBC: 0.4 % — ABNORMAL HIGH (ref 0.0–0.2)

## 2018-12-09 LAB — GLUCOSE, CAPILLARY
Glucose-Capillary: 233 mg/dL — ABNORMAL HIGH (ref 70–99)
Glucose-Capillary: 286 mg/dL — ABNORMAL HIGH (ref 70–99)
Glucose-Capillary: 284 mg/dL — ABNORMAL HIGH (ref 70–99)
Glucose-Capillary: 260 mg/dL — ABNORMAL HIGH (ref 70–99)

## 2018-12-09 LAB — HEMOGLOBIN AND HEMATOCRIT, BLOOD
HCT: 21.5 % — ABNORMAL LOW (ref 39.0–52.0)
Hemoglobin: 6.6 g/dL — CL (ref 13.0–17.0)

## 2018-12-09 MED ORDER — INSULIN DETEMIR 100 UNIT/ML ~~LOC~~ SOLN
77.0000 [IU] | Freq: Every day | SUBCUTANEOUS | Status: DC
  Administered 2018-12-09 – 2018-12-10 (×2): 77 [IU] via SUBCUTANEOUS
  Filled 2018-12-09 (×3): qty 0.77

## 2018-12-09 MED ORDER — ADULT MULTIVITAMIN W/MINERALS CH
1.0000 | ORAL_TABLET | Freq: Every day | ORAL | Status: DC
  Administered 2018-12-09 – 2018-12-11 (×3): 1 via ORAL
  Filled 2018-12-09 (×3): qty 1

## 2018-12-09 MED ORDER — FERROUS GLUCONATE 324 (38 FE) MG PO TABS
324.0000 mg | ORAL_TABLET | Freq: Three times a day (TID) | ORAL | Status: DC
  Administered 2018-12-09 – 2018-12-11 (×6): 324 mg via ORAL
  Filled 2018-12-09 (×8): qty 1

## 2018-12-09 MED ORDER — INSULIN ASPART 100 UNIT/ML ~~LOC~~ SOLN
12.0000 [IU] | Freq: Three times a day (TID) | SUBCUTANEOUS | Status: DC
  Administered 2018-12-10 – 2018-12-11 (×4): 12 [IU] via SUBCUTANEOUS

## 2018-12-09 NOTE — Progress Notes (Signed)
Contacted by Suanne Marker, diabetes coordinator, about her recommendations for changing patient's insulin orders. Contacted Dr Louanne Skye and informed him of the recommendations. He will address this when he is available.

## 2018-12-09 NOTE — Plan of Care (Signed)

## 2018-12-09 NOTE — Plan of Care (Signed)
  Problem: Self-Care: Goal: Ability to meet self-care needs will improve Outcome: Progressing   Problem: Self-Concept: Goal: Ability to maintain and perform role responsibilities to the fullest extent possible will improve Outcome: Progressing   Problem: Pain Management: Goal: Pain level will decrease with appropriate interventions Outcome: Progressing   Problem: Skin Integrity: Goal: Demonstration of wound healing without infection will improve Outcome: Progressing

## 2018-12-09 NOTE — Progress Notes (Signed)
Patient ID: Kristopher Simon, male   DOB: 07-Apr-1966, 53 y.o.   MRN: [id number] Received 2 units of blood yesterday and Hgb did not show much response. Preop Hgb was 6.9, post op decreased to 6.1 and post 2 transfusions he is only 6.7. Likely acute on chronic anemia due to chronic disease. He is awake alert and oriented x 4 no SOB or significant VS changes. Left BKA with VAC intact. No new complaints.  Acute on chronic anemia Start Ferrous gluconate. Start multivitamin tablet. Recheck lab later today to ensure no Occult cause of blood loss. Not sure further transfusion will be of benefit.

## 2018-12-10 LAB — BASIC METABOLIC PANEL
Anion gap: 5 (ref 5–15)
BUN: 10 mg/dL (ref 6–20)
CO2: 27 mmol/L (ref 22–32)
Calcium: 8.7 mg/dL — ABNORMAL LOW (ref 8.9–10.3)
Chloride: 97 mmol/L — ABNORMAL LOW (ref 98–111)
Creatinine, Ser: 1.04 mg/dL (ref 0.61–1.24)
GFR calc Af Amer: >60 mL/min (ref >60)
GFR calc non Af Amer: >60 mL/min (ref >60)
Glucose, Bld: 169 mg/dL — ABNORMAL HIGH (ref 70–99)
Potassium: 4 mmol/L (ref 3.5–5.1)
Sodium: 129 mmol/L — ABNORMAL LOW (ref 135–145)

## 2018-12-10 LAB — GLUCOSE, CAPILLARY
Glucose-Capillary: 207 mg/dL — ABNORMAL HIGH (ref 70–99)
Glucose-Capillary: 283 mg/dL — ABNORMAL HIGH (ref 70–99)
Glucose-Capillary: 178 mg/dL — ABNORMAL HIGH (ref 70–99)
Glucose-Capillary: 254 mg/dL — ABNORMAL HIGH (ref 70–99)

## 2018-12-10 LAB — CBC WITH DIFFERENTIAL/PLATELET
Abs Immature Granulocytes: 0.37 10*3/uL — ABNORMAL HIGH (ref 0.00–0.07)
Basophils Absolute: 0.1 10*3/uL (ref 0.0–0.1)
Basophils Relative: 1 %
Eosinophils Absolute: 0.4 10*3/uL (ref 0.0–0.5)
Eosinophils Relative: 3 %
HCT: 25.2 % — ABNORMAL LOW (ref 39.0–52.0)
Hemoglobin: 7.5 g/dL — ABNORMAL LOW (ref 13.0–17.0)
Immature Granulocytes: 2 %
Lymphocytes Relative: 14 %
Lymphs Abs: 2.1 10*3/uL (ref 0.7–4.0)
MCH: 24.2 pg — ABNORMAL LOW (ref 26.0–34.0)
MCHC: 29.8 g/dL — ABNORMAL LOW (ref 30.0–36.0)
MCV: 81.3 fL (ref 80.0–100.0)
Monocytes Absolute: 1.2 10*3/uL — ABNORMAL HIGH (ref 0.1–1.0)
Monocytes Relative: 8 %
Neutro Abs: 11.2 10*3/uL — ABNORMAL HIGH (ref 1.7–7.7)
Neutrophils Relative %: 72 %
Platelets: 521 10*3/uL — ABNORMAL HIGH (ref 150–400)
RBC: 3.1 MIL/uL — ABNORMAL LOW (ref 4.22–5.81)
RDW: 17.3 % — ABNORMAL HIGH (ref 11.5–15.5)
WBC: 15.3 10*3/uL — ABNORMAL HIGH (ref 4.0–10.5)
nRBC: 0.5 % — ABNORMAL HIGH (ref 0.0–0.2)

## 2018-12-10 LAB — RETICULOCYTES
Immature Retic Fract: 39.1 % — ABNORMAL HIGH (ref 2.3–15.9)
RBC.: 3.1 MIL/uL — ABNORMAL LOW (ref 4.22–5.81)
Retic Count, Absolute: 115 10*3/uL (ref 19.0–186.0)
Retic Ct Pct: 3.7 % — ABNORMAL HIGH (ref 0.4–3.1)

## 2018-12-10 LAB — VITAMIN B12: Vitamin B-12: 134 pg/mL — ABNORMAL LOW (ref 180–914)

## 2018-12-10 LAB — IRON AND TIBC
Iron: 283 ug/dL — ABNORMAL HIGH (ref 45–182)
Saturation Ratios: 79 % — ABNORMAL HIGH (ref 17.9–39.5)
TIBC: 360 ug/dL (ref 250–450)
UIBC: 77 ug/dL

## 2018-12-10 LAB — FOLATE: Folate: 4.9 ng/mL — ABNORMAL LOW (ref >5.9)

## 2018-12-10 LAB — PREPARE RBC (CROSSMATCH)

## 2018-12-10 LAB — APTT: aPTT: 30 seconds (ref 24–36)

## 2018-12-10 LAB — PROTIME-INR
INR: 1.1 (ref 0.8–1.2)
Prothrombin Time: 14 seconds (ref 11.4–15.2)

## 2018-12-10 LAB — FERRITIN: Ferritin: 26 ng/mL (ref 24–336)

## 2018-12-10 MED ORDER — SODIUM CHLORIDE 0.9% IV SOLUTION
Freq: Once | INTRAVENOUS | Status: AC
  Administered 2018-12-10: 09:00:00 via INTRAVENOUS

## 2018-12-10 NOTE — Plan of Care (Signed)
  Problem: Activity: Goal: Risk for activity intolerance will decrease Outcome: Progressing   Problem: Nutrition: Goal: Adequate nutrition will be maintained Outcome: Progressing   Problem: Coping: Goal: Level of anxiety will decrease Outcome: Progressing   

## 2018-12-10 NOTE — Consult Note (Signed)
Medical Consultation   Nefi Musich  TGG:[id number]  DOB: [birth year]  DOA: 12/07/2018  PCP: Martinique, Sarah T, MD   Outpatient Specialists: Posey Pronto - PM&R; [facility] - podiatry; [place] - cardiology; [facility] Blocker - neurosurgery   Requesting physician: Sharol Given  Reason for consultation: Multiple recent procedures, severe and recurrent anemia.  Transfusing, but still low.  Needs evaluation for anemia.    History of Present Illness: Kristopher Simon is an 53 y.o. male with h/o CVA; PTSD; PVD; HTN; DM; morbid obesity (BMI 104); CAD; and CKD presenting for BKA revision s/p dehiscence after recent fall.  Procedure was on 4/17.  Initial BKA was on 1/24.  He has had revisions on 2/28, 4/10, and 4/17.  He received 3 units PRBC on 4/18 and remains anemia.  He reports that his PCP talked to him about anemia about 3 years ago.  Was told to increase his protein intake and he worked on that and didn't hear anything after.  He does feel tired but not light-headed or weak - he is not normally tired.  No SOB.  He continues to have hematoma formation and it won't heal inside and so he can't get a prosthetic fitted.  He has now had 3 revisions following his amputation.  He reports occasional sugars >220 but mostly under 200; recent A1c 7.8 whereas previously >13.  He is always striving for glucose <200.   Review of Systems:  ROS As per HPI otherwise 10 point review of systems negative.    Past Medical History: Past Medical History:  Diagnosis Date  . Acute renal failure ([place]) 01/2012   CKD  . Anxiety   . Arthritis    hands  . Cellulitis    hx left leg   . Cellulitis of left lower extremity 07/06/2014  . Claustrophobia   . Concussion   . Coronary artery disease   . Depression   . Diabetes mellitus 1995   Type II  . DVT (deep venous thrombosis) ([place]) 2014   left leg  . Fibromyalgia   . Foot abscess, left 09/12/2018  . GERD (gastroesophageal reflux disease)   . Hypertension 1998  . Neuropathy    feet- below knee  . Panic attacks   . Peripheral vascular disease (HCC)    left leg, right neck  . PTSD (post-traumatic stress disorder)   . Stroke ([place])    x 3  memory loss - left hand- and left lower extremity-shakes at times- closes by it self- 06/2017- last one  . Wears glasses    blind in left eye, readers    Past Surgical History: Past Surgical History:  Procedure Laterality Date  . AMPUTATION Left 09/14/2018   Procedure: LEFT BELOW KNEE AMPUTATION;  Surgeon: Newt Minion, MD;  Location: [place];  Service: Orthopedics;  Laterality: Left;  . CARDIAC CATHETERIZATION  2009   stent  . COLONOSCOPY    . EYE SURGERY Left    repair torn retina - blind in left eye  . HIP FRACTURE SURGERY Right 2014   metal in hip  . STUMP REVISION Left 10/19/2018  . STUMP REVISION Left 10/19/2018   Procedure: REVISION LEFT BELOW KNEE AMPUTATION;  Surgeon: Newt Minion, MD;  Location: [place];  Service: Orthopedics;  Laterality: Left;  . STUMP REVISION Left 11/30/2018   Procedure: REVISION LEFT BELOW KNEE AMPUTATION;  Surgeon: Newt Minion, MD;  Location: [place];  Service: Orthopedics;  Laterality: Left;  REVISION LEFT BELOW KNEE AMPUTATION  . STUMP REVISION Left 12/07/2018   Procedure: REVISION LEFT BELOW KNEE AMPUTATION;  Surgeon: Newt Minion, MD;  Location: [facility];  Service: Orthopedics;  Laterality: Left;  . TONSILLECTOMY    . WISDOM TOOTH EXTRACTION       Allergies:   Allergies  Allergen Reactions  . Sulfamethoxazole-Trimethoprim Anaphylaxis and Swelling     (BACTRIM) tongue swells  . Pregabalin Swelling    SWELLING REACTION UNSPECIFIED      Social History:  reports that he has never smoked. He has never used smokeless tobacco. He reports previous alcohol use of about 1.0 standard drinks of alcohol per week. He reports that he does not use drugs.   Family History: Family History  Problem Relation Age of Onset  . Hypertension Mother   . Dementia Father   . Alzheimer's disease  Father       Physical Exam: Vitals:   12/10/18 0142 12/10/18 0428 12/10/18 0846 12/10/18 0900  BP: 120/66 131/65 (!) 143/59 (!) 156/68  Pulse: 75 72 79 75  Resp: 18 18 16 18   Temp: 98.2 F (36.8 C) 98.2 F (36.8 C) 97.8 F (36.6 C)   TempSrc: Oral Oral Oral   SpO2: 98% 97% 95% 97%  Weight:      Height:        Constitutional: Alert and awake, oriented x3, not in any acute distress. Eyes: PERLA, EOMI, irises appear normal, anicteric sclera,  ENMT: external ears and nose appear normal, normal hearing, Lips appear normal Neck: neck appears normal, no masses, normal ROM, no thyromegaly, no JVD  CVS: S1-S2 clear, no murmur rubs or gallops, no LE edema, normal pedal pulses  Respiratory:  clear to auscultation bilaterally, no wheezing, rales or rhonchi. Respiratory effort normal. No accessory muscle use.  Abdomen: soft nontender, nondistended, normal bowel sounds, no hepatosplenomegaly, morbidly obese Musculoskeletal: : s/p L BKA with wound vac in place Neuro: Cranial nerves II-XII intact, strength, sensation, reflexes Psych: judgement and insight appear normal, stable mood and affect, mental status Skin: no rashes or lesions or ulcers, no induration or nodules    Data reviewed:  I have personally reviewed the recent labs and imaging studies  Pertinent Labs:   Glucose 207 Hgb 6.9 on 4/17 -> 6.6 on 4/19 despite 3 units transfused on 4/18; 11.3 -> 9.2 on 4/10 MCV 80.3 BMP from 4/10: Glucose 191 K+ 5.5   Inpatient Medications:   Scheduled Meds: . amLODipine  5 mg Oral Daily  . citalopram  30 mg Oral Daily  . docusate sodium  100 mg Oral BID  . ferrous gluconate  324 mg Oral TID WC  . gabapentin  300 mg Oral TID  . gemfibrozil  600 mg Oral BID AC  . hydrALAZINE  25 mg Oral Q8H  . insulin aspart  0-20 Units Subcutaneous TID WC  . insulin aspart  12 Units Subcutaneous TID WC  . insulin detemir  77 Units Subcutaneous QHS  . metoprolol tartrate  75 mg Oral BID  .  multivitamin with minerals  1 tablet Oral Daily  . pravastatin  20 mg Oral QHS  . saccharomyces boulardii  250 mg Oral BID   Continuous Infusions: . sodium chloride 10 mL/hr at 12/07/18 1836  . ertapenem 1,000 mg (12/10/18 0016)  . methocarbamol (ROBAXIN) IV       Radiological Exams on Admission: No results found.  Impression/Recommendations Principal Problem:   Dehiscence of amputation stump (HCC) Active Problems:  Essential hypertension   Morbid obesity (HCC)   Acute blood loss anemia   Diabetes mellitus type 2 in obese [facility])   History of below knee amputation, left ([place])  H/o BKA with recurrent stump dehiscence -Patient with initial BKA in 08/2018 and 3 subsequent stump revisions including 2 earlier this month -Management as per orthopedics -Has wound vac in place   ABLA -Suspect that primary issue for anemia is ABLA given multiple recent surgeries -His pre-op CBC indicated normocytic anemia - likely associated with chronic disease in the setting of obesity, DM, HTN -He reports marked ongoing bleeding in his home wound vac after the 4/10 surgery and through his most recent surgery on 4/17 -He likely had equilibration of his anemia post-operatively -This is also likely complicated by home ASA and Plavix -Agree with transfusion now, although he is only mildly symptomatic -Will check anemia studies - although he is s/p transfusion and so this may only be marginally helpful -Recheck CBC in AM and plan for outpatient recheck after d/c if appropriate for d/c tomorrow  DM -Patient reports recent improving control, although 1/23 A1c was 10.1 -He will need good DM control for ongoing wound healing -Agree with diabetes coordinator consultation -He is taking high-dose BID basal insulin and Victoza at home -Check BMP  HTN -Continue home Norvasc, hydralazine  Morbid obesity -BMI is 46 -He will have significant difficulty with weight loss while he remains non-ambulatory s/p  BKA and revisions -Once he has prosthesis, significant efforts at weight loss need to be encouraged     Thank you for this consultation.  Our [facility] hospitalist team will follow the patient with you.   Time Spent: 50 minutes  Karmen Bongo M.D. [facility] 12/10/2018, 10:22 AM
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
## 2018-12-10 NOTE — Progress Notes (Signed)
Subjective: 3 Days Post-Op Procedure(s) (LRB): REVISION LEFT BELOW KNEE AMPUTATION (Left) Patient reports pain as moderate.    Objective: Vital signs in last 24 hours: Temp:  [98.2 F (36.8 C)-98.3 F (36.8 C)] 98.2 F (36.8 C) (04/20 0428) Pulse Rate:  [72-79] 72 (04/20 0428) Resp:  [16-18] 18 (04/20 0428) BP: (120-164)/(65-72) 131/65 (04/20 0428) SpO2:  [97 %-99 %] 97 % (04/20 0428)  Intake/Output from previous day: 04/19 0701 - 04/20 0700 In: 720 [P.O.:720] Out: 2975 [Urine:2975] Intake/Output this shift: No intake/output data recorded.  Recent Labs    12/07/18 1243 12/08/18 1722 12/09/18 0725 12/09/18 1852  HGB 6.9* 6.1* 6.6* 6.6*   Recent Labs    12/07/18 1243  12/09/18 0725 12/09/18 1852  WBC 11.2*  --   --  12.3*  RBC 3.08*  --   --  2.79*  HCT 24.4*   < > 21.5* 22.4*  PLT 501*  --   --  436*   < > = values in this interval not displayed.   No results for input(s): NA, K, CL, CO2, BUN, CREATININE, GLUCOSE, CALCIUM in the last 72 hours. Recent Labs    12/07/18 1243  INR 1.1   VAC dressing over the left transtibial amputation site and functioning well . VAC canister with scant drainage in canister.   Assessment/Plan: 3 Days Post-Op Procedure(s) (LRB): REVISION LEFT BELOW KNEE AMPUTATION (Left) Continues IV Invanz for ESBL infection.   Will ask Medicine to consult for evaluation of anemia. Patient gives history of being anemic several years ago, but does not know why.  Will transfuse another unit of [place], PA-C 12/10/2018, 8:19 AM  [facility] [phone]

## 2018-12-11 LAB — TYPE AND SCREEN
ABO/RH(D): A NEG
Antibody Screen: NEGATIVE
Unit division: 0
Unit division: 0
Unit division: 0

## 2018-12-11 LAB — CBC
HCT: 23.6 % — ABNORMAL LOW (ref 39.0–52.0)
Hemoglobin: 7.3 g/dL — ABNORMAL LOW (ref 13.0–17.0)
MCH: 25.4 pg — ABNORMAL LOW (ref 26.0–34.0)
MCHC: 30.9 g/dL (ref 30.0–36.0)
MCV: 82.2 fL (ref 80.0–100.0)
Platelets: 452 10*3/uL — ABNORMAL HIGH (ref 150–400)
RBC: 2.87 MIL/uL — ABNORMAL LOW (ref 4.22–5.81)
RDW: 17.6 % — ABNORMAL HIGH (ref 11.5–15.5)
WBC: 13.8 10*3/uL — ABNORMAL HIGH (ref 4.0–10.5)
nRBC: 0.5 % — ABNORMAL HIGH (ref 0.0–0.2)

## 2018-12-11 LAB — BPAM RBC
Blood Product Expiration Date: 202004272359
Blood Product Expiration Date: 202004272359
Blood Product Expiration Date: 202005012359
ISSUE DATE / TIME: 202004181259
ISSUE DATE / TIME: 202004182058
ISSUE DATE / TIME: 202004200848
Unit Type and Rh: 600
Unit Type and Rh: 600
Unit Type and Rh: 600

## 2018-12-11 LAB — GLUCOSE, CAPILLARY: Glucose-Capillary: 287 mg/dL — ABNORMAL HIGH (ref 70–99)

## 2018-12-11 MED ORDER — FERROUS GLUCONATE 324 (38 FE) MG PO TABS: 324.0000 mg | ORAL_TABLET | Freq: Three times a day (TID) | ORAL | 3 refills | Status: DC

## 2018-12-11 MED ORDER — HEPARIN SOD (PORK) LOCK FLUSH 100 UNIT/ML IV SOLN
250.0000 [IU] | INTRAVENOUS | Status: AC | PRN
  Administered 2018-12-11: 10:00:00 250 [IU]

## 2018-12-11 MED ORDER — ERTAPENEM IV (FOR PTA / DISCHARGE USE ONLY): 1.0000 g | INTRAVENOUS | 0 refills | Status: AC

## 2018-12-11 MED ORDER — OXYCODONE-ACETAMINOPHEN 10-325 MG PO TABS: 1.0000 | ORAL_TABLET | ORAL | 0 refills | Status: DC | PRN

## 2018-12-11 NOTE — Progress Notes (Signed)
Subjective: 4 Days Post-Op Procedure(s) (LRB): REVISION LEFT BELOW KNEE AMPUTATION (Left) Patient reports pain as moderate.    Objective: Vital signs in last 24 hours: Temp:  [97.8 F (36.6 C)-98.6 F (37 C)] 98.3 F (36.8 C) (04/21 0447) Pulse Rate:  [62-79] 62 (04/21 0547) Resp:  [16-20] 18 (04/21 0447) BP: (130-172)/(59-74) 130/65 (04/21 0547) SpO2:  [95 %-100 %] 96 % (04/21 0447)  Intake/Output from previous day: 04/20 0701 - 04/21 0700 In: 4128 [P.O.:1680; I.V.:10] Out: 4025 [Urine:4025] Intake/Output this shift: No intake/output data recorded.  Recent Labs    12/08/18 1722 12/09/18 0725 12/09/18 1852 12/10/18 1645 12/11/18 0406  HGB 6.1* 6.6* 6.6* 7.5* 7.3*   Recent Labs    12/10/18 1645 12/11/18 0406  WBC 15.3* 13.8*  RBC 3.10*  3.10* 2.87*  HCT 25.2* 23.6*  PLT 521* 452*   Recent Labs    12/10/18 1645  NA 129*  K 4.0  CL 97*  CO2 27  BUN 10  CREATININE 1.04  GLUCOSE 169*  CALCIUM 8.7*   Recent Labs    12/10/18 1645  INR 1.1    VAC dressing intact and functioning over the left transtibial amputation site. Scant drainage in tubing.    Assessment/Plan: 4 Days Post-Op Procedure(s) (LRB): REVISION LEFT BELOW KNEE AMPUTATION (Left) Plan DC home today with [place] per PICC for ESBL infection Anemia- appreciate IM consult- likely related to multiple procedures and chronic disease  Will plan to DC on baby ASA 81 mg daily and continue to hold Plavix for now per Dr. Sharol Given  Follow up in the office next week.   Erlinda Hong, PA-C 12/11/2018, 7:28 AM  [employer] [phone]

## 2018-12-11 NOTE — TOC Transition Note (Signed)
Transition of Care [facility]) - CM/SW Discharge Note   Patient Details  Name: Kristopher Simon MRN: [id number] Date of Birth: 12-Oct-1965  Transition of Care [facility]) CM/SW Contact:  Marilu Favre, RN Phone Number: 12/11/2018, 8:24 AM   Clinical Narrative:    Spoke to patient regarding discharge today. Pam with [facility], and Dan with [facility] aware of discharge today.   Final next level of care: [place] Barriers to Discharge: Continued Medical Work up   Patient Goals and CMS Choice Patient states their goals for this hospitalization and ongoing recovery are:: to go home  CMS Medicare.gov Compare Post Acute Care list provided to:: Patient Choice offered to / list presented to : Patient  Discharge Placement                       Discharge Plan and Services   Discharge Planning Services: CM Consult Post Acute Care Choice: Home Health          DME Arranged: N/A DME Agency: NA HH Arranged: RN [place] Agency: [place] ([facility])   Social Determinants of Health (SDOH) Interventions     Readmission Risk Interventions No flowsheet data found.

## 2018-12-11 NOTE — Discharge Instructions (Signed)
Keep Prevena VAC machine plugged into wall outlet as much as possible.   Follow up in the office next Tuesday, 12/18/2018

## 2018-12-11 NOTE — Discharge Summary (Addendum)
Discharge Diagnoses:  Principal Problem:   Dehiscence of amputation stump ([name]) Active Problems:   Essential hypertension   Morbid obesity ([name])   Acute blood loss anemia   Diabetes mellitus type 2 in obese ([name])   History of below knee amputation, left ([name])   Surgeries: Procedure(s): REVISION LEFT BELOW KNEE AMPUTATION on 12/07/2018    Consultants: Treatment Team:  Minerva Ends, MD  Discharged Condition: Improved  Hospital Course: Kristopher Simon is an 53 y.o. male who was admitted 12/07/2018 with a chief complaint of hematoma/dehiscence left transtibial amputation, with a final diagnosis of Dehiscence Left Below Knee Amputation.  Patient was brought to the operating room on 12/07/2018 and underwent Procedure(s): REVISION LEFT BELOW KNEE AMPUTATION.    Patient was given perioperative antibiotics:  Anti-infectives (From admission, onward)   Start     Dose/Rate Route Frequency Ordered Stop   12/11/18 0000  ertapenem [facility]) IVPB     1 g Intravenous Every 24 hours 12/11/18 0745 12/21/18 2359   12/07/18 2000  ertapenem (INVANZ) 1,000 mg in sodium chloride 0.9 % 100 mL IVPB     1 g 200 mL/hr over 30 Minutes Intravenous Every 24 hours 12/07/18 1841     12/07/18 1830  ertapenem [facility]) IVPB  Status:  Discontinued    Note to Pharmacy:  Indication:  ESBL Klebsiella wound infection Last Day of Therapy:  12/15/18 Labs - Once weekly:  CBC/D and BMP, Labs - Every other week:  ESR and CRP     1 g Intravenous Every 24 hours 12/07/18 1827 12/07/18 1840   12/07/18 1530  ceFAZolin (ANCEF) 3 g in dextrose 5 % 50 mL IVPB     3 g 100 mL/hr over 30 Minutes Intravenous  Once 12/06/18 1312 12/07/18 1646    .  Patient was given sequential compression devices, early ambulation, and aspirin for DVT prophylaxis.  Recent vital signs:  Patient Vitals for the past 24 hrs:  BP Temp Temp src Pulse Resp SpO2  12/11/18 0547 130/65 - - 62 - -  12/11/18 0447 (!) 144/74 98.3 F (36.8 C) Oral 67 18 96 %   12/10/18 2057 140/61 98.4 F (36.9 C) Oral 75 18 97 %  12/10/18 1300 (!) 155/68 98.6 F (37 C) - 78 18 100 %  12/10/18 1149 (!) 172/73 98.3 F (36.8 C) Oral 74 20 99 %  12/10/18 0900 (!) 156/68 - - 75 18 97 %  .  Recent laboratory studies: No results found.  Discharge Medications:   Allergies as of 12/11/2018      Reactions   Sulfamethoxazole-trimethoprim Anaphylaxis, Swelling    (BACTRIM) tongue swells   Pregabalin Swelling   SWELLING REACTION UNSPECIFIED       Medication List    STOP taking these medications   ciprofloxacin 500 MG tablet Commonly known as:  Cipro   clopidogrel 75 MG tablet Commonly known as:  PLAVIX   insulin detemir 100 UNIT/ML injection Commonly known as:  LEVEMIR     TAKE these medications   acetaminophen 325 MG tablet Commonly known as:  TYLENOL Take 1-2 tablets (325-650 mg total) by mouth every 4 (four) hours as needed for mild pain.   ALPRAZolam 1 MG tablet Commonly known as:  XANAX Take 1 mg by mouth as needed for anxiety. Has had a prescription   amLODipine 5 MG tablet Commonly known as:  NORVASC Take 1 tablet (5 mg total) by mouth daily.   aspirin EC 81 MG tablet Take 1 tablet (81 mg  total) by mouth daily.   citalopram 10 MG tablet Commonly known as:  CELEXA Take 3 tablets (30 mg total) by mouth daily. What changed:    how much to take  when to take this   cyclobenzaprine 5 MG tablet Commonly known as:  FLEXERIL Take 1 tablet (5 mg total) by mouth 3 (three) times daily as needed for muscle spasms. What changed:  when to take this   ertapenem  IVPB Commonly known as:  INVANZ Inject 1 g into the vein daily for 10 days. Indication:  ESBL Klebsiella wound infection Last Day of Therapy:  12/15/18 Labs - Once weekly:  CBC/D and BMP, Labs - Every other week:  ESR and CRP   esomeprazole 40 MG capsule Commonly known as:  NEXIUM Take 1 capsule (40 mg total) by mouth every morning.   ferrous gluconate 324 MG tablet Commonly  known as:  FERGON Take 1 tablet (324 mg total) by mouth 3 (three) times daily with meals.   gabapentin 300 MG capsule Commonly known as:  NEURONTIN Take 1 capsule (300 mg total) by mouth 3 (three) times daily.   gemfibrozil 600 MG tablet Commonly known as:  LOPID Take 1 tablet (600 mg total) by mouth 2 (two) times daily before a meal.   hydrALAZINE 25 MG tablet Commonly known as:  APRESOLINE Take 1 tablet (25 mg total) by mouth every 8 (eight) hours.   hydrocerin Crea Apply 1 application topically 2 (two) times daily. To dry skin on right foot/leg   hydrOXYzine 10 MG tablet Commonly known as:  ATARAX/VISTARIL Take 1 tablet (10 mg total) by mouth 3 (three) times daily.   insulin aspart 100 UNIT/ML injection Commonly known as:  novoLOG Inject 20 Units into the skin 3 (three) times daily with meals. What changed:    how much to take  when to take this   Metoprolol Tartrate 75 MG Tabs Take 75 mg by mouth 2 (two) times daily.   Nucynta 100 MG Tabs Generic drug:  Tapentadol HCl Take 1 tablet (100 mg total) by mouth every 6 (six) hours. After a week or so --then try weaning to as needed as pain gets better. What changed:  additional instructions   oxyCODONE-acetaminophen 10-325 MG tablet Commonly known as:  PERCOCET Take 1 tablet by mouth every 4 (four) hours as needed for pain.   polyethylene glycol 17 g packet Commonly known as:  MIRALAX / GLYCOLAX Take 17 g by mouth 2 (two) times daily.   pravastatin 20 MG tablet Commonly known as:  PRAVACHOL Take 20 mg by mouth at bedtime.   saccharomyces boulardii 250 MG capsule Commonly known as:  FLORASTOR Take 1 capsule (250 mg total) by mouth 2 (two) times daily.   traZODone 50 MG tablet Commonly known as:  DESYREL Take 0.5-1 tablets (25-50 mg total) by mouth at bedtime as needed for sleep. What changed:  how much to take   [place] FlexTouch 100 UNIT/ML Sopn FlexTouch Pen Generic drug:  insulin degludec Inject 77 Units  into the skin 2 (two) times daily.   Victoza 18 MG/3ML Sopn Generic drug:  liraglutide Inject 0.3 mLs (1.8 mg total) into the skin daily.       Diagnostic Studies: [place] Ekg Site Rite  Result Date: 12/04/2018 If Site Rite image not attached, placement could not be confirmed due to current cardiac rhythm.   Patient benefited maximally from their hospital stay and there were no complications.     Disposition: Discharge disposition: 01-Home  or Self Care      Discharge Instructions    Call MD / Call 911   Complete by:  As directed    If you experience chest pain or shortness of breath, CALL 911 and be transported to the hospital emergency room.  If you develope a fever above 101 F, pus (white drainage) or increased drainage or redness at the wound, or calf pain, call your surgeon's office.   Constipation Prevention   Complete by:  As directed    Drink plenty of fluids.  Prune juice may be helpful.  You may use a stool softener, such as Colace (over the counter) 100 mg twice a day.  Use MiraLax (over the counter) for constipation as needed.   Diet - low sodium heart healthy   Complete by:  As directed    Increase activity slowly as tolerated   Complete by:  As directed      Follow-up Information    Newt Minion, MD. Schedule an appointment as soon as possible for a visit on 12/18/2018.   Specialty:  Orthopedic Surgery Contact information: [place] [address] [place] Follow up.   Why:  [phone]           Signed: Erlinda Hong, PA-C 12/11/2018, 8:58 AM  [place]

## 2018-12-11 NOTE — Progress Notes (Signed)
Patient discharged to home with instructions and switch to  Wound Vac portable, PICC line flushed by IV team

## 2018-12-13 ENCOUNTER — Ambulatory Visit: Payer: Self-pay | Admitting: Physical Medicine & Rehabilitation

## 2018-12-14 ENCOUNTER — Encounter (INDEPENDENT_AMBULATORY_CARE_PROVIDER_SITE_OTHER): Payer: Self-pay | Admitting: Physician Assistant

## 2018-12-14 ENCOUNTER — Telehealth (INDEPENDENT_AMBULATORY_CARE_PROVIDER_SITE_OTHER): Payer: Self-pay

## 2018-12-14 ENCOUNTER — Other Ambulatory Visit: Payer: Self-pay

## 2018-12-14 ENCOUNTER — Ambulatory Visit (INDEPENDENT_AMBULATORY_CARE_PROVIDER_SITE_OTHER): Payer: Medicaid Other | Admitting: Physician Assistant

## 2018-12-14 VITALS — Ht 76.0 in | Wt 385.0 lb

## 2018-12-14 DIAGNOSIS — E1142 Type 2 diabetes mellitus with diabetic polyneuropathy: Secondary | ICD-10-CM

## 2018-12-14 DIAGNOSIS — Z6841 Body Mass Index (BMI) 40.0 and over, adult: Secondary | ICD-10-CM

## 2018-12-14 DIAGNOSIS — Z89512 Acquired absence of left leg below knee: Secondary | ICD-10-CM

## 2018-12-14 DIAGNOSIS — Z794 Long term (current) use of insulin: Secondary | ICD-10-CM

## 2018-12-14 DIAGNOSIS — E440 Moderate protein-calorie malnutrition: Secondary | ICD-10-CM

## 2018-12-14 NOTE — Telephone Encounter (Signed)
Requesting verbal wound care orders after pt is seen today
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
## 2018-12-14 NOTE — Telephone Encounter (Signed)
I called and sw Kristopher Simon to advise that pt in office today and wound vac was removed. Pt will apply a dry dressing change daily. Also advised to have CBC and Bmet on Monday and to call or fax results. Depending on the labs will be able to advise if he is able to have his PICC line removed. Pt was also given written order. To call with any questions.

## 2018-12-18 ENCOUNTER — Telehealth (INDEPENDENT_AMBULATORY_CARE_PROVIDER_SITE_OTHER): Payer: Self-pay | Admitting: Physician Assistant

## 2018-12-18 ENCOUNTER — Telehealth (INDEPENDENT_AMBULATORY_CARE_PROVIDER_SITE_OTHER): Payer: Self-pay

## 2018-12-18 NOTE — Telephone Encounter (Signed)
[facility] called to see if we received the lab results for this pt and if we wanted them to pull his PICC line. cb # [phone] this message was sent to Shawn who ordered the labs and was going to make a determination on this today. Message sent to her to call and advise.

## 2018-12-18 NOTE — Telephone Encounter (Signed)
Spoke with [facility] , Levada Dy concerning the patient's lab results with WBC still 13.6.  Patient completed  10 days of ertapenem for ESBL Klebsiella. Advised HH to leave PICC line in for now and the patient will follow up in the office tomorrow.  Dr. Sharol Given advised may need another wash out in OR later this week.   [place]

## 2018-12-19 ENCOUNTER — Telehealth (INDEPENDENT_AMBULATORY_CARE_PROVIDER_SITE_OTHER): Payer: Self-pay

## 2018-12-19 ENCOUNTER — Other Ambulatory Visit (INDEPENDENT_AMBULATORY_CARE_PROVIDER_SITE_OTHER): Payer: Self-pay | Admitting: Orthopedic Surgery

## 2018-12-19 ENCOUNTER — Encounter (INDEPENDENT_AMBULATORY_CARE_PROVIDER_SITE_OTHER): Payer: Self-pay | Admitting: Family

## 2018-12-19 ENCOUNTER — Ambulatory Visit (INDEPENDENT_AMBULATORY_CARE_PROVIDER_SITE_OTHER): Payer: Medicaid Other | Admitting: Family

## 2018-12-19 ENCOUNTER — Other Ambulatory Visit: Payer: Self-pay

## 2018-12-19 VITALS — Ht 76.0 in | Wt 385.0 lb

## 2018-12-19 DIAGNOSIS — Z89512 Acquired absence of left leg below knee: Secondary | ICD-10-CM

## 2018-12-19 MED ORDER — OXYCODONE-ACETAMINOPHEN 10-325 MG PO TABS: 1.0000 | ORAL_TABLET | ORAL | 0 refills | Status: DC | PRN

## 2018-12-19 NOTE — Telephone Encounter (Signed)
Levada Dy with [facility] called stating patient needed refill on pain medication. She also stated that patient actually only had 1 dose of abx instead of 3 that was mentioned at his OV today.  I s/w Dr Sharol Given and he advised ok for them to d/c PICC.  Last abx dose will be today and they will d/c PICC either tomorrow or Friday Dr Sharol Given sent in refill to pharmacy.
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
## 2018-12-24 ENCOUNTER — Other Ambulatory Visit: Payer: Self-pay | Admitting: Orthopedic Surgery

## 2018-12-24 ENCOUNTER — Telehealth: Payer: Self-pay | Admitting: Family

## 2018-12-24 MED ORDER — OXYCODONE-ACETAMINOPHEN 10-325 MG PO TABS: 1.0000 | ORAL_TABLET | Freq: Four times a day (QID) | ORAL | 0 refills | Status: DC | PRN

## 2018-12-24 NOTE — Telephone Encounter (Signed)
Patient called requested refill of Oxycodone 10.  Please call patient to advise.  [phone]

## 2018-12-24 NOTE — Telephone Encounter (Signed)
Patient was called and informed.

## 2018-12-24 NOTE — Telephone Encounter (Signed)
rx written

## 2018-12-24 NOTE — Telephone Encounter (Signed)
Dr Duda please advise, thank you. 

## 2018-12-26 ENCOUNTER — Ambulatory Visit (INDEPENDENT_AMBULATORY_CARE_PROVIDER_SITE_OTHER): Payer: Medicaid Other | Admitting: Family

## 2018-12-26 ENCOUNTER — Other Ambulatory Visit: Payer: Self-pay

## 2018-12-26 ENCOUNTER — Encounter: Payer: Self-pay | Admitting: Family

## 2018-12-26 VITALS — Ht 76.0 in | Wt 385.0 lb

## 2018-12-26 DIAGNOSIS — Z89512 Acquired absence of left leg below knee: Secondary | ICD-10-CM
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
## 2018-12-29 ENCOUNTER — Other Ambulatory Visit: Payer: Self-pay | Admitting: Orthopedic Surgery

## 2018-12-31 ENCOUNTER — Other Ambulatory Visit: Payer: Self-pay | Admitting: Orthopedic Surgery

## 2018-12-31 MED ORDER — OXYCODONE-ACETAMINOPHEN 10-325 MG PO TABS: 1.0000 | ORAL_TABLET | Freq: Four times a day (QID) | ORAL | 0 refills | Status: DC | PRN

## 2018-12-31 NOTE — Telephone Encounter (Signed)
rx sent

## 2018-12-31 NOTE — Telephone Encounter (Signed)
Duda patient °

## 2018-12-31 NOTE — Telephone Encounter (Signed)
Do you want to refill? 

## 2019-01-01 ENCOUNTER — Telehealth: Payer: Self-pay | Admitting: Orthopedic Surgery

## 2019-01-01 NOTE — Telephone Encounter (Signed)
Patient called left voicemail message needing a Rx refilled (Oxycodone) The number to contact patient is [phone]

## 2019-01-02 ENCOUNTER — Ambulatory Visit (INDEPENDENT_AMBULATORY_CARE_PROVIDER_SITE_OTHER): Payer: Medicaid Other | Admitting: Family

## 2019-01-02 ENCOUNTER — Other Ambulatory Visit: Payer: Self-pay

## 2019-01-02 ENCOUNTER — Encounter: Payer: Self-pay | Admitting: Family

## 2019-01-02 VITALS — Ht 76.0 in | Wt 385.0 lb

## 2019-01-02 DIAGNOSIS — Z89512 Acquired absence of left leg below knee: Secondary | ICD-10-CM

## 2019-01-02 NOTE — Telephone Encounter (Signed)
Please advise Junie Panning, thank you. Last filled 12/24/18 Qty 20 for 5 days. Oxy-Acet 10/325

## 2019-01-02 NOTE — Telephone Encounter (Signed)
This cannot be refilled until 01/05/19
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
## 2019-01-02 NOTE — Telephone Encounter (Signed)
Ok, thank you will call and advise.

## 2019-01-07 ENCOUNTER — Other Ambulatory Visit: Payer: Self-pay | Admitting: Orthopedic Surgery

## 2019-01-07 MED ORDER — OXYCODONE-ACETAMINOPHEN 10-325 MG PO TABS: 1.0000 | ORAL_TABLET | Freq: Four times a day (QID) | ORAL | 0 refills | Status: DC | PRN

## 2019-01-07 NOTE — Telephone Encounter (Signed)
rx sent, needs to wean off

## 2019-01-07 NOTE — Telephone Encounter (Signed)
Patient would like someone to call him letting him know it was sent to make less trips to pharmacy

## 2019-01-07 NOTE — Telephone Encounter (Signed)
Dr Sharol Given, please advise, thank you

## 2019-01-07 NOTE — Telephone Encounter (Signed)
Patient called asking if he can get refill. Per pharmacy said it was ok to refill now.

## 2019-01-08 NOTE — Telephone Encounter (Signed)
I called and informed patient and he states it's only for phantom pain and he would like recommendations for something else.

## 2019-01-16 ENCOUNTER — Telehealth: Payer: Self-pay | Admitting: Orthopedic Surgery

## 2019-01-16 ENCOUNTER — Other Ambulatory Visit: Payer: Self-pay | Admitting: Orthopedic Surgery

## 2019-01-16 MED ORDER — OXYCODONE-ACETAMINOPHEN 10-325 MG PO TABS: 1.0000 | ORAL_TABLET | Freq: Four times a day (QID) | ORAL | 0 refills | Status: DC | PRN

## 2019-01-16 NOTE — Telephone Encounter (Signed)
Patient calling to get his oxycodone refilled.  Uses [address] [facility].    Also patient said that Gerald Stabs can pick up Prosthetic Rx for [facility] for the patient since he is coming by today around 2pm to pick up one for another patient.   cb  [phone]

## 2019-01-16 NOTE — Telephone Encounter (Signed)
rx sent

## 2019-01-16 NOTE — Telephone Encounter (Signed)
12/07/18 left bka revision requesting refill on pain medication.

## 2019-01-17 NOTE — Telephone Encounter (Signed)
I called pt and advised that rx has been sent to pharm and that Gerald Stabs did pick up rx for prosthetic yesterday.

## 2019-01-30 ENCOUNTER — Other Ambulatory Visit: Payer: Self-pay

## 2019-01-30 ENCOUNTER — Ambulatory Visit (INDEPENDENT_AMBULATORY_CARE_PROVIDER_SITE_OTHER): Payer: Medicaid Other | Admitting: Family

## 2019-01-30 ENCOUNTER — Encounter: Payer: Self-pay | Admitting: Family

## 2019-01-30 VITALS — Ht 76.0 in | Wt 385.0 lb

## 2019-01-30 DIAGNOSIS — Z89512 Acquired absence of left leg below knee: Secondary | ICD-10-CM

## 2019-01-30 MED ORDER — OXYCODONE-ACETAMINOPHEN 7.5-325 MG PO TABS: 1.0000 | ORAL_TABLET | Freq: Four times a day (QID) | ORAL | 0 refills | Status: DC | PRN
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
## 2019-02-13 ENCOUNTER — Telehealth: Payer: Self-pay | Admitting: Orthopedic Surgery

## 2019-02-13 MED ORDER — OXYCODONE-ACETAMINOPHEN 7.5-325 MG PO TABS: 1.0000 | ORAL_TABLET | Freq: Three times a day (TID) | ORAL | 0 refills | Status: DC | PRN

## 2019-02-13 NOTE — Telephone Encounter (Signed)
Patient called needing Rx refilled (Oxycodone) The number to contact patient is [phone]

## 2019-02-13 NOTE — Telephone Encounter (Signed)
Erin please advise, thank you.  

## 2019-02-18 ENCOUNTER — Telehealth: Payer: Self-pay | Admitting: Orthopedic Surgery

## 2019-02-18 ENCOUNTER — Other Ambulatory Visit: Payer: Self-pay | Admitting: Orthopedic Surgery

## 2019-02-18 MED ORDER — OXYCODONE-ACETAMINOPHEN 7.5-325 MG PO TABS: 1.0000 | ORAL_TABLET | Freq: Three times a day (TID) | ORAL | 0 refills | Status: DC | PRN

## 2019-02-18 NOTE — Telephone Encounter (Signed)
I called and sw the pt to advise that Dr. Sharol Given did send in a one time refill but that the pt must use this very sparingly and that he would not be able to refill after today. If any continued pain or problems to call the office and we would be happy to see him and eval.

## 2019-02-18 NOTE — Telephone Encounter (Signed)
Rx refill Oxycodone °

## 2019-02-18 NOTE — Telephone Encounter (Signed)
Pt is s/p a revision BKA on 12/07/18. He is asking for a refill on his Oxycodone. Last refill was 02/13/19 # 15 please advise.

## 2019-02-18 NOTE — Telephone Encounter (Signed)
rx sent

## 2019-02-18 NOTE — Telephone Encounter (Signed)
I called pt and he states that he started walking with his prosthetic on Friday and that he is having phantom pain and that the percocet is the only thing that helps. He states that if he increases his Neurontin it makes him sleepy and that he is allergic to lyrica and wants to know what else it is that you would suggest for pain control. Please advise.

## 2019-02-18 NOTE — Telephone Encounter (Signed)
Too soon to refill.

## 2019-02-20 ENCOUNTER — Other Ambulatory Visit: Payer: Self-pay

## 2019-02-20 DIAGNOSIS — Z89512 Acquired absence of left leg below knee: Secondary | ICD-10-CM

## 2019-03-04 ENCOUNTER — Ambulatory Visit: Payer: Medicaid Other | Attending: Orthopedic Surgery | Admitting: Physical Therapy

## 2019-03-04 ENCOUNTER — Encounter: Payer: Self-pay | Admitting: Physical Therapy

## 2019-03-04 ENCOUNTER — Ambulatory Visit: Payer: Medicaid Other | Admitting: Physical Therapy

## 2019-03-04 ENCOUNTER — Other Ambulatory Visit: Payer: Self-pay

## 2019-03-04 DIAGNOSIS — Z9181 History of falling: Secondary | ICD-10-CM | POA: Diagnosis present

## 2019-03-04 DIAGNOSIS — R2681 Unsteadiness on feet: Secondary | ICD-10-CM | POA: Insufficient documentation

## 2019-03-04 DIAGNOSIS — R2689 Other abnormalities of gait and mobility: Secondary | ICD-10-CM | POA: Insufficient documentation

## 2019-03-04 DIAGNOSIS — M6281 Muscle weakness (generalized): Secondary | ICD-10-CM | POA: Diagnosis present

## 2019-03-04 DIAGNOSIS — R293 Abnormal posture: Secondary | ICD-10-CM | POA: Insufficient documentation
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
## 2019-03-13 ENCOUNTER — Ambulatory Visit: Payer: Medicaid Other | Admitting: *Deleted

## 2019-03-25 ENCOUNTER — Other Ambulatory Visit: Payer: Self-pay | Admitting: Physical Medicine and Rehabilitation

## 2019-03-25 ENCOUNTER — Encounter: Payer: Self-pay | Admitting: Physical Therapy

## 2019-03-25 ENCOUNTER — Other Ambulatory Visit: Payer: Self-pay | Admitting: Orthopedic Surgery

## 2019-03-25 ENCOUNTER — Ambulatory Visit: Payer: Medicaid Other | Attending: Orthopedic Surgery | Admitting: Physical Therapy

## 2019-03-25 ENCOUNTER — Other Ambulatory Visit: Payer: Self-pay

## 2019-03-25 DIAGNOSIS — R293 Abnormal posture: Secondary | ICD-10-CM

## 2019-03-25 DIAGNOSIS — R2681 Unsteadiness on feet: Secondary | ICD-10-CM | POA: Diagnosis present

## 2019-03-25 DIAGNOSIS — R2689 Other abnormalities of gait and mobility: Secondary | ICD-10-CM | POA: Diagnosis present

## 2019-03-25 DIAGNOSIS — M6281 Muscle weakness (generalized): Secondary | ICD-10-CM

## 2019-03-25 NOTE — Patient Instructions (Signed)
Do each exercise 2  times per day Do each exercise 10 repetitions Hold each exercise for 3-5 seconds to feel your location  AT SINK FIND YOUR MIDLINE POSITION AND PLACE FEET EQUAL DISTANCE FROM THE MIDLINE.  USE TAPE ON FLOOR TO MARK THE MIDLINE POSITION. You also should try to feel with your limb pressure in socket.  You are trying to feel with limb what you used to feel with the bottom of your foot.  1. Side to Side Shift: Moving your hips only (not shoulders): move weight onto your left leg, HOLD/FEEL.  Move back to equal weight on each leg, HOLD/FEEL. Move weight onto your right leg, HOLD/FEEL. Move back to equal weight on each leg, HOLD/FEEL. Repeat. 2. Front to Back Shift: Moving your hips only (not shoulders): move your weight forward onto your toes, HOLD/FEEL. Move your weight back to equal Flat Foot on both legs, HOLD/FEEL. Move your weight back onto your heels, HOLD/FEEL. Move your weight back to equal on both legs, HOLD/FEEL. Repeat. 3. Moving Cones / Cups: With equal weight on each leg: Hold on with one hand the first time, then progress to no hand supports. Move cups from one side of sink to the other. Place cups ~2" out of your reach, progress to 10" beyond reach. 4. Overhead/Upward Reaching: alternated reaching up to top cabinets or ceiling if no cabinets present. Keep equal weight on each leg. Start with one hand support on counter while other hand reaches and progress to no hand support with reaching. 5.   Looking Over Shoulders: With equal weight on each leg: alternate turning to look    over your shoulders with one hand support on counter as needed. Shift weight to side looking, pull hip then shoulder then head/eyes around to look behind you. Start with one hand support & progress to no hand support. 
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
## 2019-03-26 NOTE — Telephone Encounter (Signed)
No. Patient does not have a follow up appointment either.  Thanks.

## 2019-03-26 NOTE — Telephone Encounter (Signed)
Refill request for Citalopram 20 mg. Not listed in notes to continue. Is it ok to refill?

## 2019-04-01 ENCOUNTER — Encounter: Payer: Self-pay | Admitting: Physical Therapy

## 2019-04-01 ENCOUNTER — Ambulatory Visit: Payer: Medicaid Other | Admitting: Physical Therapy

## 2019-04-01 ENCOUNTER — Other Ambulatory Visit: Payer: Self-pay

## 2019-04-01 ENCOUNTER — Ambulatory Visit: Payer: Self-pay | Admitting: Physical Therapy

## 2019-04-01 DIAGNOSIS — M6281 Muscle weakness (generalized): Secondary | ICD-10-CM

## 2019-04-01 DIAGNOSIS — R2689 Other abnormalities of gait and mobility: Secondary | ICD-10-CM

## 2019-04-01 DIAGNOSIS — R293 Abnormal posture: Secondary | ICD-10-CM

## 2019-04-01 DIAGNOSIS — R2681 Unsteadiness on feet: Secondary | ICD-10-CM
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
## 2019-04-08 ENCOUNTER — Encounter: Payer: Self-pay | Admitting: Physical Therapy

## 2019-04-08 ENCOUNTER — Ambulatory Visit: Payer: Medicaid Other | Admitting: Physical Therapy

## 2019-04-08 ENCOUNTER — Other Ambulatory Visit: Payer: Self-pay

## 2019-04-08 DIAGNOSIS — R2689 Other abnormalities of gait and mobility: Secondary | ICD-10-CM

## 2019-04-08 DIAGNOSIS — R2681 Unsteadiness on feet: Secondary | ICD-10-CM

## 2019-04-08 DIAGNOSIS — M6281 Muscle weakness (generalized): Secondary | ICD-10-CM

## 2019-04-08 DIAGNOSIS — R293 Abnormal posture: Secondary | ICD-10-CM
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
## 2019-04-09 ENCOUNTER — Other Ambulatory Visit: Payer: Self-pay | Admitting: Physical Medicine and Rehabilitation

## 2019-04-09 ENCOUNTER — Other Ambulatory Visit: Payer: Self-pay | Admitting: Orthopedic Surgery

## 2019-04-22 ENCOUNTER — Ambulatory Visit: Payer: Medicaid Other | Admitting: Physical Therapy

## 2019-04-22 ENCOUNTER — Encounter: Payer: Self-pay | Admitting: Physical Therapy

## 2019-04-22 ENCOUNTER — Other Ambulatory Visit: Payer: Self-pay

## 2019-04-22 DIAGNOSIS — R293 Abnormal posture: Secondary | ICD-10-CM

## 2019-04-22 DIAGNOSIS — R2689 Other abnormalities of gait and mobility: Secondary | ICD-10-CM

## 2019-04-22 DIAGNOSIS — M6281 Muscle weakness (generalized): Secondary | ICD-10-CM

## 2019-04-22 DIAGNOSIS — R2681 Unsteadiness on feet: Secondary | ICD-10-CM
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
## 2019-04-25 ENCOUNTER — Ambulatory Visit: Payer: Medicaid Other | Attending: Orthopedic Surgery | Admitting: Physical Therapy

## 2019-04-25 ENCOUNTER — Other Ambulatory Visit: Payer: Self-pay

## 2019-04-25 ENCOUNTER — Encounter: Payer: Self-pay | Admitting: Physical Therapy

## 2019-04-25 DIAGNOSIS — M6281 Muscle weakness (generalized): Secondary | ICD-10-CM | POA: Insufficient documentation

## 2019-04-25 DIAGNOSIS — R293 Abnormal posture: Secondary | ICD-10-CM | POA: Insufficient documentation

## 2019-04-25 DIAGNOSIS — R2689 Other abnormalities of gait and mobility: Secondary | ICD-10-CM | POA: Insufficient documentation

## 2019-04-25 DIAGNOSIS — R2681 Unsteadiness on feet: Secondary | ICD-10-CM | POA: Insufficient documentation

## 2019-04-25 DIAGNOSIS — Z9181 History of falling: Secondary | ICD-10-CM | POA: Diagnosis present
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
## 2019-04-30 ENCOUNTER — Other Ambulatory Visit: Payer: Self-pay

## 2019-04-30 ENCOUNTER — Ambulatory Visit: Payer: Medicaid Other | Admitting: Physical Therapy

## 2019-04-30 ENCOUNTER — Encounter: Payer: Self-pay | Admitting: Physical Therapy

## 2019-04-30 DIAGNOSIS — R2681 Unsteadiness on feet: Secondary | ICD-10-CM | POA: Diagnosis not present

## 2019-04-30 DIAGNOSIS — R293 Abnormal posture: Secondary | ICD-10-CM

## 2019-04-30 DIAGNOSIS — R2689 Other abnormalities of gait and mobility: Secondary | ICD-10-CM

## 2019-04-30 DIAGNOSIS — M6281 Muscle weakness (generalized): Secondary | ICD-10-CM
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
## 2019-05-02 ENCOUNTER — Encounter: Payer: Self-pay | Admitting: Physical Therapy

## 2019-05-02 ENCOUNTER — Other Ambulatory Visit: Payer: Self-pay

## 2019-05-02 ENCOUNTER — Ambulatory Visit: Payer: Medicaid Other | Admitting: Physical Therapy

## 2019-05-02 DIAGNOSIS — R293 Abnormal posture: Secondary | ICD-10-CM

## 2019-05-02 DIAGNOSIS — R2689 Other abnormalities of gait and mobility: Secondary | ICD-10-CM

## 2019-05-02 DIAGNOSIS — R2681 Unsteadiness on feet: Secondary | ICD-10-CM

## 2019-05-02 DIAGNOSIS — M6281 Muscle weakness (generalized): Secondary | ICD-10-CM
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
## 2019-05-03 ENCOUNTER — Ambulatory Visit (INDEPENDENT_AMBULATORY_CARE_PROVIDER_SITE_OTHER): Payer: Medicaid Other | Admitting: Psychiatry

## 2019-05-03 ENCOUNTER — Encounter (HOSPITAL_COMMUNITY): Payer: Self-pay | Admitting: Psychiatry

## 2019-05-03 VITALS — Ht 77.0 in | Wt >= 6400 oz

## 2019-05-03 DIAGNOSIS — F331 Major depressive disorder, recurrent, moderate: Secondary | ICD-10-CM | POA: Diagnosis not present

## 2019-05-03 DIAGNOSIS — F4310 Post-traumatic stress disorder, unspecified: Secondary | ICD-10-CM | POA: Diagnosis not present

## 2019-05-03 DIAGNOSIS — Z8659 Personal history of other mental and behavioral disorders: Secondary | ICD-10-CM

## 2019-05-03 DIAGNOSIS — F0630 Mood disorder due to known physiological condition, unspecified: Secondary | ICD-10-CM | POA: Diagnosis not present

## 2019-05-03 MED ORDER — CITALOPRAM HYDROBROMIDE 40 MG PO TABS: 40.0000 mg | ORAL_TABLET | Freq: Every day | ORAL | 0 refills | Status: DC
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
## 2019-05-06 ENCOUNTER — Encounter: Payer: Self-pay | Admitting: Physical Therapy

## 2019-05-06 ENCOUNTER — Ambulatory Visit: Payer: Medicaid Other | Admitting: Physical Therapy

## 2019-05-06 ENCOUNTER — Other Ambulatory Visit: Payer: Self-pay

## 2019-05-06 DIAGNOSIS — R2689 Other abnormalities of gait and mobility: Secondary | ICD-10-CM

## 2019-05-06 DIAGNOSIS — M6281 Muscle weakness (generalized): Secondary | ICD-10-CM

## 2019-05-06 DIAGNOSIS — R293 Abnormal posture: Secondary | ICD-10-CM

## 2019-05-06 DIAGNOSIS — R2681 Unsteadiness on feet: Secondary | ICD-10-CM
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
## 2019-05-08 ENCOUNTER — Encounter: Payer: Self-pay | Admitting: Family

## 2019-05-08 ENCOUNTER — Ambulatory Visit (INDEPENDENT_AMBULATORY_CARE_PROVIDER_SITE_OTHER): Payer: Medicaid Other | Admitting: Family

## 2019-05-08 VITALS — Ht 77.0 in | Wt >= 6400 oz

## 2019-05-08 DIAGNOSIS — L239 Allergic contact dermatitis, unspecified cause: Secondary | ICD-10-CM | POA: Diagnosis not present

## 2019-05-08 MED ORDER — HYDROCORTISONE 2.5 % EX CREA: TOPICAL_CREAM | Freq: Two times a day (BID) | CUTANEOUS | 0 refills | Status: DC

## 2019-05-09 ENCOUNTER — Other Ambulatory Visit: Payer: Self-pay

## 2019-05-09 ENCOUNTER — Ambulatory Visit: Payer: Medicaid Other | Admitting: Physical Therapy

## 2019-05-09 ENCOUNTER — Encounter: Payer: Self-pay | Admitting: Family

## 2019-05-09 ENCOUNTER — Encounter: Payer: Self-pay | Admitting: Physical Therapy

## 2019-05-09 DIAGNOSIS — R293 Abnormal posture: Secondary | ICD-10-CM

## 2019-05-09 DIAGNOSIS — Z9181 History of falling: Secondary | ICD-10-CM

## 2019-05-09 DIAGNOSIS — R2689 Other abnormalities of gait and mobility: Secondary | ICD-10-CM

## 2019-05-09 DIAGNOSIS — M6281 Muscle weakness (generalized): Secondary | ICD-10-CM

## 2019-05-09 DIAGNOSIS — R2681 Unsteadiness on feet: Secondary | ICD-10-CM | POA: Diagnosis not present
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
## 2019-05-13 ENCOUNTER — Other Ambulatory Visit: Payer: Self-pay

## 2019-05-13 ENCOUNTER — Encounter: Payer: Self-pay | Admitting: Physical Therapy

## 2019-05-13 ENCOUNTER — Ambulatory Visit: Payer: Medicaid Other | Admitting: Physical Therapy

## 2019-05-13 DIAGNOSIS — R2689 Other abnormalities of gait and mobility: Secondary | ICD-10-CM

## 2019-05-13 DIAGNOSIS — R293 Abnormal posture: Secondary | ICD-10-CM

## 2019-05-13 DIAGNOSIS — R2681 Unsteadiness on feet: Secondary | ICD-10-CM

## 2019-05-13 DIAGNOSIS — Z9181 History of falling: Secondary | ICD-10-CM

## 2019-05-13 DIAGNOSIS — M6281 Muscle weakness (generalized): Secondary | ICD-10-CM

## 2019-05-14 ENCOUNTER — Other Ambulatory Visit: Payer: Self-pay | Admitting: Family

## 2019-05-14 MED ORDER — TRIAMCINOLONE ACETONIDE 0.1 % EX CREA: 1.0000 "application " | TOPICAL_CREAM | Freq: Two times a day (BID) | CUTANEOUS | 0 refills | Status: DC
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
## 2019-05-15 ENCOUNTER — Encounter: Payer: Self-pay | Admitting: Physical Therapy

## 2019-05-15 ENCOUNTER — Ambulatory Visit: Payer: Medicaid Other | Admitting: Physical Therapy

## 2019-05-15 ENCOUNTER — Other Ambulatory Visit: Payer: Self-pay

## 2019-05-15 DIAGNOSIS — R2681 Unsteadiness on feet: Secondary | ICD-10-CM | POA: Diagnosis not present

## 2019-05-15 DIAGNOSIS — R293 Abnormal posture: Secondary | ICD-10-CM

## 2019-05-15 DIAGNOSIS — R2689 Other abnormalities of gait and mobility: Secondary | ICD-10-CM

## 2019-05-15 DIAGNOSIS — M6281 Muscle weakness (generalized): Secondary | ICD-10-CM
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
## 2019-05-20 ENCOUNTER — Encounter: Payer: Medicaid Other | Admitting: Physical Therapy

## 2019-05-23 ENCOUNTER — Encounter: Payer: Medicaid Other | Admitting: Physical Therapy

## 2019-05-27 ENCOUNTER — Ambulatory Visit: Payer: Medicaid Other | Attending: Orthopedic Surgery | Admitting: Physical Therapy

## 2019-05-27 ENCOUNTER — Telehealth: Payer: Self-pay | Admitting: Physical Therapy

## 2019-05-27 ENCOUNTER — Other Ambulatory Visit: Payer: Self-pay

## 2019-05-27 ENCOUNTER — Encounter: Payer: Self-pay | Admitting: Physical Therapy

## 2019-05-27 ENCOUNTER — Other Ambulatory Visit: Payer: Self-pay | Admitting: Orthopedic Surgery

## 2019-05-27 DIAGNOSIS — M6281 Muscle weakness (generalized): Secondary | ICD-10-CM | POA: Diagnosis present

## 2019-05-27 DIAGNOSIS — R2681 Unsteadiness on feet: Secondary | ICD-10-CM | POA: Diagnosis present

## 2019-05-27 DIAGNOSIS — M79604 Pain in right leg: Secondary | ICD-10-CM | POA: Diagnosis present

## 2019-05-27 DIAGNOSIS — S88119A Complete traumatic amputation at level between knee and ankle, unspecified lower leg, initial encounter: Secondary | ICD-10-CM

## 2019-05-27 DIAGNOSIS — R293 Abnormal posture: Secondary | ICD-10-CM | POA: Diagnosis present

## 2019-05-27 DIAGNOSIS — Z9181 History of falling: Secondary | ICD-10-CM | POA: Insufficient documentation

## 2019-05-27 DIAGNOSIS — Z89512 Acquired absence of left leg below knee: Secondary | ICD-10-CM

## 2019-05-27 DIAGNOSIS — R2689 Other abnormalities of gait and mobility: Secondary | ICD-10-CM

## 2019-05-27 NOTE — Telephone Encounter (Signed)
Thank you.  The order is sent to neuro rehab PT

## 2019-05-27 NOTE — Telephone Encounter (Signed)
Omair's residual limb has significantly improved in skin condition.  He is ambulating with a cane in PT safely. Can you please write a prescription for a bariatric cane with offset handle? He weighs 409# which needs to be documented on script to cover bariatric. Please place order in Epic or FAX to [phone]. Thank you Shirlean Mylar
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
## 2019-05-30 ENCOUNTER — Other Ambulatory Visit: Payer: Self-pay

## 2019-05-30 ENCOUNTER — Encounter: Payer: Self-pay | Admitting: Physical Therapy

## 2019-05-30 ENCOUNTER — Encounter (HOSPITAL_COMMUNITY): Payer: Self-pay | Admitting: Psychiatry

## 2019-05-30 ENCOUNTER — Ambulatory Visit: Payer: Medicaid Other | Admitting: Physical Therapy

## 2019-05-30 ENCOUNTER — Ambulatory Visit (INDEPENDENT_AMBULATORY_CARE_PROVIDER_SITE_OTHER): Payer: Medicaid Other | Admitting: Psychiatry

## 2019-05-30 DIAGNOSIS — F0630 Mood disorder due to known physiological condition, unspecified: Secondary | ICD-10-CM

## 2019-05-30 DIAGNOSIS — Z8659 Personal history of other mental and behavioral disorders: Secondary | ICD-10-CM

## 2019-05-30 DIAGNOSIS — M79604 Pain in right leg: Secondary | ICD-10-CM

## 2019-05-30 DIAGNOSIS — R2689 Other abnormalities of gait and mobility: Secondary | ICD-10-CM | POA: Diagnosis not present

## 2019-05-30 DIAGNOSIS — R2681 Unsteadiness on feet: Secondary | ICD-10-CM

## 2019-05-30 DIAGNOSIS — F4310 Post-traumatic stress disorder, unspecified: Secondary | ICD-10-CM | POA: Diagnosis not present

## 2019-05-30 DIAGNOSIS — Z9181 History of falling: Secondary | ICD-10-CM

## 2019-05-30 DIAGNOSIS — F411 Generalized anxiety disorder: Secondary | ICD-10-CM | POA: Diagnosis not present

## 2019-05-30 DIAGNOSIS — F331 Major depressive disorder, recurrent, moderate: Secondary | ICD-10-CM

## 2019-05-30 DIAGNOSIS — F39 Unspecified mood [affective] disorder: Secondary | ICD-10-CM | POA: Diagnosis not present

## 2019-05-30 DIAGNOSIS — M6281 Muscle weakness (generalized): Secondary | ICD-10-CM

## 2019-05-30 DIAGNOSIS — F0631 Mood disorder due to known physiological condition with depressive features: Secondary | ICD-10-CM

## 2019-05-30 DIAGNOSIS — R293 Abnormal posture: Secondary | ICD-10-CM

## 2019-05-30 MED ORDER — CITALOPRAM HYDROBROMIDE 40 MG PO TABS: 40.0000 mg | ORAL_TABLET | Freq: Every day | ORAL | 1 refills | Status: DC

## 2019-05-30 NOTE — Progress Notes (Signed)
[facility] Follow up visit  Patient Identification: Kristopher Simon MRN:  [id number] Date of Evaluation:  05/30/2019 Referral Source: primary care Chief Complaint:    Visit Diagnosis:    ICD-10-CM   1. 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 MDD (major depressive disorder), recurrent episode, moderate (HCC)  F33.1   3. History of panic attacks  Z86.59   4. PTSD (post-traumatic stress disorder)  F43.10     I connected with [name] on 05/30/19 at  1:30 PM EDT by a video enabled telemedicine application and verified that I am speaking with the correct person using two identifiers.   I discussed the limitations of evaluation and management by telemedicine and the availability of in person appointments. The patient expressed understanding and agreed to proceed.  History of Present Illness: Patient is a 53 years old currently single Caucasian male living with his mom.  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  Referred by primary care physician for management of anxiety and depression  Has claustrophobia since accident   He has been on Xanax in past.  Last visit celexa was increased to 40mg , some better but still apprehension and anxiety, distressed sleep He is also on gabapentin, hydroxyzine Modifying factors: mom, house, prosthetic leg fit well Aggravating factor: amputation, difficult childhood, multiple surgeries and injuries  Duration more then 10 years  Past psych patient was having marital problems he has done counseling  No prior psych admission or suicide attempt    Past Psychiatric History: depression  Previous Psychotropic Medications: Yes    Past Medical History:  Past Medical History:  Diagnosis Date  . Acute renal failure ([place]) 01/2012   CKD  . Anxiety   . Arthritis    hands  . Cellulitis    hx left leg   . Cellulitis of left lower extremity 07/06/2014  . Claustrophobia   . Concussion   . Coronary artery  disease   . Dehiscence of amputation stump ([place])   . Depression   . Diabetes mellitus 1995   Type II  . DVT (deep venous thrombosis) ([place]) 2014   left leg  . Fibromyalgia   . Foot abscess, left 09/12/2018  . GERD (gastroesophageal reflux disease)   . Hypertension 1998  . Neuropathy    feet- below knee  . Panic attacks   . Peripheral vascular disease (HCC)    left leg, right neck  . PTSD (post-traumatic stress disorder)   . Stroke ([place])    x 3  memory loss - left hand- and left lower extremity-shakes at times- closes by it self- 06/2017- last one  . Wears glasses    blind in left eye, readers    Past Surgical History:  Procedure Laterality Date  . AMPUTATION Left 09/14/2018   Procedure: LEFT BELOW KNEE AMPUTATION;  Surgeon: Newt Minion, MD;  Location: [facility];  Service: Orthopedics;  Laterality: Left;  . CARDIAC CATHETERIZATION  2009   stent  . COLONOSCOPY    . EYE SURGERY Left    repair torn retina - blind in left eye  . HIP FRACTURE SURGERY Right 2014   metal in hip  . STUMP REVISION Left 10/19/2018  . STUMP REVISION Left 10/19/2018   Procedure: REVISION LEFT BELOW KNEE AMPUTATION;  Surgeon: Newt Minion, MD;  Location: [facility];  Service: Orthopedics;  Laterality: Left;  . STUMP REVISION Left 11/30/2018   Procedure: REVISION LEFT BELOW KNEE AMPUTATION;  Surgeon: Meridee Score  V, MD;  Location: [facility];  Service: Orthopedics;  Laterality: Left;  REVISION LEFT BELOW KNEE AMPUTATION  . STUMP REVISION Left 12/07/2018   Procedure: REVISION LEFT BELOW KNEE AMPUTATION;  Surgeon: Newt Minion, MD;  Location: [facility];  Service: Orthopedics;  Laterality: Left;  . TONSILLECTOMY    . WISDOM TOOTH EXTRACTION      Family Psychiatric History: aunt : alcohol use, Dad: depression  Family History:  Family History  Problem Relation Age of Onset  . Hypertension Mother   . Dementia Father   . Alzheimer's disease Father     Social History:   Social History   Socioeconomic History  .  Marital status: Divorced    Spouse name: Not on file  . Number of children: Not on file  . Years of education: Not on file  . Highest education level: Not on file  Occupational History  . Not on file  Social Needs  . Financial resource strain: Not on file  . Food insecurity    Worry: Not on file    Inability: Not on file  . Transportation needs    Medical: Not on file    Non-medical: Not on file  Tobacco Use  . Smoking status: Never Smoker  . Smokeless tobacco: Never Used  Substance and Sexual Activity  . Alcohol use: Not Currently    Alcohol/week: 1.0 standard drinks    Types: 1 Glasses of wine per week    Comment: occasional   . Drug use: No  . Sexual activity: Yes    Birth control/protection: None  Lifestyle  . Physical activity    Days per week: Not on file    Minutes per session: Not on file  . Stress: Not on file  Relationships  . Social Herbalist on phone: Not on file    Gets together: Not on file    Attends religious service: Not on file    Active member of club or organization: Not on file    Attends meetings of clubs or organizations: Not on file    Relationship status: Not on file  Other Topics Concern  . Not on file  Social History Narrative  . Not on file        Allergies:   Allergies  Allergen Reactions  . Sulfamethoxazole-Trimethoprim Anaphylaxis and Swelling     ([place]) tongue swells  . Pregabalin Swelling    SWELLING REACTION UNSPECIFIED     Metabolic Disorder Labs: Lab Results  Component Value Date   HGBA1C 10.1 (H) 09/13/2018   MPG 243.17 09/13/2018   MPG 306 (H) 07/09/2014   No results found for: PROLACTIN No results found for: CHOL, TRIG, HDL, CHOLHDL, VLDL, LDLCALC Lab Results  Component Value Date   TSH 2.448 02/05/2012    Therapeutic Level Labs: No results found for: LITHIUM No results found for: CBMZ No results found for: VALPROATE  Current Medications: Current Outpatient Medications  Medication Sig  Dispense Refill  . acetaminophen (TYLENOL) 325 MG tablet Take 1-2 tablets (325-650 mg total) by mouth every 4 (four) hours as needed for mild pain.    Marland Kitchen amLODipine (NORVASC) 5 MG tablet Take 1 tablet (5 mg total) by mouth daily. 30 tablet 0  . aspirin EC 81 MG tablet Take 1 tablet (81 mg total) by mouth daily. 30 tablet 0  . citalopram (CELEXA) 40 MG tablet Take 1 tablet (40 mg total) by mouth daily. 30 tablet 1  . cyclobenzaprine (FLEXERIL) 5 MG  tablet Take 1 tablet (5 mg total) by mouth 3 (three) times daily as needed for muscle spasms. (Patient taking differently: Take 5 mg by mouth 3 (three) times daily. ) 45 tablet 0  . esomeprazole (NEXIUM) 40 MG capsule Take 1 capsule (40 mg total) by mouth every morning. 30 capsule 0  . ferrous gluconate (FERGON) 324 MG tablet Take 1 tablet (324 mg total) by mouth 3 (three) times daily with meals. 90 tablet 3  . gabapentin (NEURONTIN) 300 MG capsule Take 1 capsule (300 mg total) by mouth 3 (three) times daily. 90 capsule 0  . gemfibrozil (LOPID) 600 MG tablet Take 1 tablet (600 mg total) by mouth 2 (two) times daily before a meal.    . hydrALAZINE (APRESOLINE) 25 MG tablet Take 1 tablet (25 mg total) by mouth every 8 (eight) hours. 90 tablet 0  . hydrocerin (EUCERIN) CREA Apply 1 application topically 2 (two) times daily. To dry skin on right foot/leg  0  . hydrOXYzine (ATARAX/VISTARIL) 10 MG tablet Take 1 tablet (10 mg total) by mouth 3 (three) times daily. 90 tablet 0  . insulin aspart (NOVOLOG) 100 UNIT/ML injection Inject 20 Units into the skin 3 (three) times daily with meals. (Patient taking differently: Inject 30 Units into the skin 3 (three) times daily before meals. ) 10 mL 11  . insulin degludec (TRESIBA FLEXTOUCH) 100 UNIT/ML SOPN FlexTouch Pen Inject 77 Units into the skin 2 (two) times daily.    . insulin regular human CONCENTRATED (HUMULIN R U-500 KWIKPEN) 500 UNIT/ML kwikpen Inject 135 units in the AM with breakfast, inject 90 units in the PM  with dinner, approximately 12 hours apart    . metoprolol tartrate 75 MG TABS Take 75 mg by mouth 2 (two) times daily. 60 tablet 0  . NUCYNTA 100 MG TABS Take 1 tablet (100 mg total) by mouth every 6 (six) hours. After a week or so --then try weaning to as needed as pain gets better. (Patient taking differently: Take 100 mg by mouth every 6 (six) hours. ) 120 tablet 0  . oxyCODONE-acetaminophen (PERCOCET) 7.5-325 MG tablet Take 1 tablet by mouth every 8 (eight) hours as needed. 15 tablet 0  . polyethylene glycol (MIRALAX / GLYCOLAX) packet Take 17 g by mouth 2 (two) times daily. 14 each 0  . pravastatin (PRAVACHOL) 20 MG tablet Take 20 mg by mouth at bedtime.     . saccharomyces boulardii (FLORASTOR) 250 MG capsule Take 1 capsule (250 mg total) by mouth 2 (two) times daily. 30 capsule 0  . traZODone (DESYREL) 50 MG tablet Take 0.5-1 tablets (25-50 mg total) by mouth at bedtime as needed for sleep. (Patient taking differently: Take 50 mg by mouth at bedtime as needed for sleep. ) 15 tablet 0  . triamcinolone cream (KENALOG) 0.1 % Apply 1 application topically 2 (two) times daily. 30 g 0  . VICTOZA 18 MG/3ML SOPN Inject 0.3 mLs (1.8 mg total) into the skin daily.  5   No current facility-administered medications for this visit.       Psychiatric Specialty Exam: Review of Systems  Cardiovascular: Negative for chest pain.  Musculoskeletal: Positive for myalgias.  Skin: Negative for rash.    There were no vitals taken for this visit.There is no height or weight on file to calculate BMI.  General Appearance: Casual  Eye Contact:  Fair  Speech:  Slow  Volume:  Normal  Mood:  Somewhat subdued  Affect:  Congruent  Thought Process:  Goal Directed  Orientation:  Full (Time, Place, and Person)  Thought Content:  Logical  Suicidal Thoughts:  No  Homicidal Thoughts:  No  Memory:  Immediate;   Fair Recent;   Fair  Judgement:  Fair  Insight:  Fair  Psychomotor Activity:  Decreased   Concentration:  Concentration: Fair and Attention Span: Fair  Recall:  [employer] of Knowledge:Fair  Language: Fair  Akathisia:  No  Handed:  Right  AIMS (if indicated):  not done  Assets:  Communication Skills Desire for Improvement Financial Resources/Insurance Social Support  ADL's:  Intact with some limitations due to amputation  Cognition: WNL   Sleep:  Fair   Screenings: PHQ2-9     Office Visit from 11/02/2018 in [facility] Office Visit from 10/08/2018 in [facility]  PHQ-2 Total Score  2  4  PHQ-9 Total Score  -  12      Assessment and Plan: as follows Mood disorder vs MDD moderate recurrent: due to stressors, medical and amputation.  Feels subdued at times due to limitations. Continue celexa Highly recommend therapy call office to schedule one for coing skills   GAD with panic attacks; continue celexa, schedule therapy Also consider sleep clinic referral as sleep apnea may contribute to poor sleep and nightmares Taking gaba and vistaril. Should not add more med as he is alson on celex.    PTSD: States he has not been a motor vehicle accident continje celexa, refer to therpay   I discussed the assessment and treatment plan with the patient. The patient was provided an opportunity to ask questions and all were answered. The patient agreed with the plan and demonstrated an understanding of the instructions.  Fu 3-4 weeks or earlier if needed   The patient was advised to call back or seek an in-person evaluation if the symptoms worsen or if the condition fails to improve as anticipated.  [name], MD 10/8/20201:43 PM
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
## 2019-06-05 ENCOUNTER — Ambulatory Visit: Payer: Medicaid Other | Admitting: Physical Therapy

## 2019-06-05 ENCOUNTER — Encounter: Payer: Self-pay | Admitting: Physical Therapy

## 2019-06-05 ENCOUNTER — Other Ambulatory Visit: Payer: Self-pay

## 2019-06-05 DIAGNOSIS — Z9181 History of falling: Secondary | ICD-10-CM

## 2019-06-05 DIAGNOSIS — R293 Abnormal posture: Secondary | ICD-10-CM

## 2019-06-05 DIAGNOSIS — R2689 Other abnormalities of gait and mobility: Secondary | ICD-10-CM | POA: Diagnosis not present

## 2019-06-05 DIAGNOSIS — M6281 Muscle weakness (generalized): Secondary | ICD-10-CM

## 2019-06-05 DIAGNOSIS — R2681 Unsteadiness on feet: Secondary | ICD-10-CM
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
## 2019-06-06 NOTE — Patient Instructions (Signed)
Increasing your activity level is important. Short distances which is walking from one room to another. Work to increase frequency back to prior level. Medium distances are entering & exiting your home or community with limited distances. Start with 4 medium walks which is one outing to one location and increase number of tolerated amounts. Long distance is your highest tolerance for you. Walk until you feel you must rest. Back or leg pain or general fatigue are indicators to maximum tolerance. Monitor by distance or time. Try to walk your BEST distance 1-2 times per day. You should see this increase over time.   Can use grocery cart alternating with power cart to work on endurance also

## 2019-06-07 ENCOUNTER — Ambulatory Visit: Payer: Medicaid Other | Admitting: Physical Therapy

## 2019-06-07 ENCOUNTER — Other Ambulatory Visit: Payer: Self-pay

## 2019-06-07 DIAGNOSIS — R293 Abnormal posture: Secondary | ICD-10-CM

## 2019-06-07 DIAGNOSIS — R2689 Other abnormalities of gait and mobility: Secondary | ICD-10-CM

## 2019-06-07 DIAGNOSIS — R2681 Unsteadiness on feet: Secondary | ICD-10-CM

## 2019-06-07 DIAGNOSIS — M6281 Muscle weakness (generalized): Secondary | ICD-10-CM
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
## 2019-06-12 ENCOUNTER — Other Ambulatory Visit: Payer: Self-pay

## 2019-06-12 ENCOUNTER — Encounter: Payer: Self-pay | Admitting: Physical Therapy

## 2019-06-12 ENCOUNTER — Ambulatory Visit: Payer: Medicaid Other | Admitting: Physical Therapy

## 2019-06-12 DIAGNOSIS — R2681 Unsteadiness on feet: Secondary | ICD-10-CM

## 2019-06-12 DIAGNOSIS — R2689 Other abnormalities of gait and mobility: Secondary | ICD-10-CM

## 2019-06-12 DIAGNOSIS — Z9181 History of falling: Secondary | ICD-10-CM

## 2019-06-12 DIAGNOSIS — M6281 Muscle weakness (generalized): Secondary | ICD-10-CM

## 2019-06-12 DIAGNOSIS — R293 Abnormal posture: Secondary | ICD-10-CM

## 2019-06-12 NOTE — Patient Instructions (Addendum)
Bike for increasing endurance. Keep the resistance low and speed comfortable until you build overall time to >20 minutes Progress to next level once current level is not hard & causes fatigue 1. Ride 5 minutes, rest 3 minutes for 3 sets 2. Ride 6 minutes, rest 3 minutes for 3 sets 3. Ride 7 minutes, rest 3 minutes for 3 sets 4. Ride 7 minutes, rest 2 minutes for 3 sets 5. Ride 8 minutes, rest 2 minutes for 3 sets 6. Ride 8 minutes, rest 1 minute for 3 sets 7. Ride 9 minutes, rest 1 minute for 3 sets 8. Ride 10 minutes, rest 2 minutes for 2 sets 9. Ride 10 minutes, rest 1 minute for 2 sets 10. Ride 20 minutes no rest 11. Add 1 minute until can do 30 minutes  DRY limb after riding before walking Bariatric Surgery Information Bariatric surgery, also called weight loss surgery, is a procedure that helps you lose weight. You may consider, or your health care provider may suggest, bariatric surgery if:  You are severely obese and have been unable to lose weight through diet and exercise.  You have health problems related to obesity, such as: ? Type 2 diabetes. ? Heart disease. ? Lung disease. How does bariatric surgery help me lose weight? Bariatric surgery helps you lose weight by:  Decreasing how much food your body absorbs. This is done by closing off part of your stomach to make it smaller. This restricts the amount of food your stomach can hold.  Changing your body's regular digestive process so that food bypasses the parts of your body that absorb calories and nutrients. If you decide to have bariatric surgery, it is important to continue to eat a healthy diet and exercise regularly after the surgery. What are the different kinds of bariatric surgery? There are two kinds of bariatric surgeries:  Restrictive surgery. This procedure makes your stomach smaller. It does not change your digestive process. The smaller the size of your new stomach, the less food you can eat. There are  different types of restrictive surgeries.  Malabsorptive surgery. This procedure makes your stomach smaller and alters your digestive process so that your body processes less calories and nutrients. These are the most common kind of bariatric surgery. There are different types of malabsorptive surgeries. What are the different types of restrictive surgery? Adjustable Gastric Banding In this procedure, an inflatable band is placed around your stomach near the upper end. This makes the passageway for food into the rest of your stomach much smaller. The band can be adjusted, making it tighter or looser, by filling it with salt solution. Your surgeon can adjust the band based on how you are feeling and how much weight you are losing. The band can be removed in the future. This requires another surgery. Sleeve Gastrectomy In this procedure, your stomach is made smaller. This is done by surgically removing a large part of your stomach. When your stomach is smaller, you feel full more quickly and reduce how much you eat. What are the different types of malabsorptive surgery?  Roux-en-Y Gastric Bypass (RGB) This is the most common weight loss surgery. In this procedure, a small stomach pouch (gastric pouch) is created in the upper part of your stomach. Next, this gastric pouch is attached directly to the middle part of your small intestine. The farther down your small intestine the new connection is made, the fewer calories and nutrients you will absorb. This surgery has the highest rate of complications.  Biliopancreatic Diversion with Duodenal Switch (BPD/DS) This is a multi-step procedure. First, a large part of your stomach is removed, making your stomach smaller. Next, this smaller stomach is attached to the lower part of your small intestine. Like the RGB surgery, you absorb fewer calories and nutrients the farther down your small intestine the attachment is made. What are the risks of bariatric  surgery? As with any surgical procedure, each type of bariatric surgery has its own risks. These risks also depend on your age, your overall health, and any other medical conditions you may have. When deciding on bariatric surgery, it is very important to:  Talk to your health care provider and choose the surgery that is best for you.  Ask your health care provider about specific risks for the surgery you choose. Generally, the risks of bariatric surgery include:  Infection.  Bleeding.  Not getting enough nutrients from food (nutritional deficiencies).  Failure of the device or procedure. This may require another surgery to correct the problem. Where to find more information  American Society for Metabolic & Bariatric Surgery: www.asmbs.org  Weight-control Information Network (WIN): win.AmenCredit.is Summary  Bariatric surgery, also called weight loss surgery, is a procedure that helps you lose weight.  This surgery may be recommended if you have diabetes, heart disease, or lung disease.  Generally, risks of bariatric surgery include infection, bleeding, and failure of the surgery or device, which may require another surgery to correct the problem. This information is not intended to replace advice given to you by your health care provider. Make sure you discuss any questions you have with your health care provider. Document Released: 08/08/2005 Document Revised: 11/27/2018 Document Reviewed: 09/12/2016 Elsevier Patient Education  2020 [employer].
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
## 2019-06-21 ENCOUNTER — Ambulatory Visit: Payer: Medicaid Other | Admitting: Physical Therapy

## 2019-06-21 ENCOUNTER — Other Ambulatory Visit: Payer: Self-pay

## 2019-06-21 ENCOUNTER — Encounter: Payer: Self-pay | Admitting: Physical Therapy

## 2019-06-21 DIAGNOSIS — M6281 Muscle weakness (generalized): Secondary | ICD-10-CM

## 2019-06-21 DIAGNOSIS — R2689 Other abnormalities of gait and mobility: Secondary | ICD-10-CM | POA: Diagnosis not present

## 2019-06-21 DIAGNOSIS — R293 Abnormal posture: Secondary | ICD-10-CM

## 2019-06-21 DIAGNOSIS — R2681 Unsteadiness on feet: Secondary | ICD-10-CM
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
## 2019-06-24 ENCOUNTER — Encounter: Payer: Self-pay | Admitting: Physical Therapy

## 2019-06-24 ENCOUNTER — Other Ambulatory Visit: Payer: Self-pay

## 2019-06-24 ENCOUNTER — Ambulatory Visit: Payer: Medicaid Other | Attending: Orthopedic Surgery | Admitting: Physical Therapy

## 2019-06-24 DIAGNOSIS — R2689 Other abnormalities of gait and mobility: Secondary | ICD-10-CM

## 2019-06-24 DIAGNOSIS — R2681 Unsteadiness on feet: Secondary | ICD-10-CM

## 2019-06-24 DIAGNOSIS — R293 Abnormal posture: Secondary | ICD-10-CM | POA: Diagnosis not present

## 2019-06-24 DIAGNOSIS — M79604 Pain in right leg: Secondary | ICD-10-CM | POA: Diagnosis present

## 2019-06-24 DIAGNOSIS — M6281 Muscle weakness (generalized): Secondary | ICD-10-CM | POA: Diagnosis present

## 2019-06-24 DIAGNOSIS — Z9181 History of falling: Secondary | ICD-10-CM
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
## 2019-06-26 ENCOUNTER — Ambulatory Visit: Payer: Medicaid Other | Admitting: Physical Therapy

## 2019-06-28 ENCOUNTER — Other Ambulatory Visit: Payer: Self-pay

## 2019-06-28 ENCOUNTER — Ambulatory Visit (INDEPENDENT_AMBULATORY_CARE_PROVIDER_SITE_OTHER): Payer: Medicaid Other | Admitting: Licensed Clinical Social Worker

## 2019-06-28 DIAGNOSIS — Z8659 Personal history of other mental and behavioral disorders: Secondary | ICD-10-CM

## 2019-06-28 DIAGNOSIS — F4310 Post-traumatic stress disorder, unspecified: Secondary | ICD-10-CM | POA: Diagnosis not present

## 2019-06-28 DIAGNOSIS — F331 Major depressive disorder, recurrent, moderate: Secondary | ICD-10-CM

## 2019-06-28 DIAGNOSIS — F0630 Mood disorder due to known physiological condition, unspecified: Secondary | ICD-10-CM
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
## 2019-07-01 ENCOUNTER — Encounter: Payer: Self-pay | Admitting: Physical Therapy

## 2019-07-01 ENCOUNTER — Other Ambulatory Visit: Payer: Self-pay

## 2019-07-01 ENCOUNTER — Ambulatory Visit: Payer: Medicaid Other | Admitting: Physical Therapy

## 2019-07-01 DIAGNOSIS — R293 Abnormal posture: Secondary | ICD-10-CM | POA: Diagnosis not present

## 2019-07-01 DIAGNOSIS — M6281 Muscle weakness (generalized): Secondary | ICD-10-CM

## 2019-07-01 DIAGNOSIS — R2681 Unsteadiness on feet: Secondary | ICD-10-CM

## 2019-07-01 DIAGNOSIS — R2689 Other abnormalities of gait and mobility: Secondary | ICD-10-CM

## 2019-07-01 DIAGNOSIS — Z9181 History of falling: Secondary | ICD-10-CM
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
## 2019-07-03 ENCOUNTER — Encounter: Payer: Medicaid Other | Admitting: Physical Therapy

## 2019-07-08 ENCOUNTER — Encounter: Payer: Self-pay | Admitting: Physical Therapy

## 2019-07-08 ENCOUNTER — Other Ambulatory Visit: Payer: Self-pay

## 2019-07-08 ENCOUNTER — Ambulatory Visit: Payer: Medicaid Other | Admitting: Physical Therapy

## 2019-07-08 DIAGNOSIS — R293 Abnormal posture: Secondary | ICD-10-CM

## 2019-07-08 DIAGNOSIS — M6281 Muscle weakness (generalized): Secondary | ICD-10-CM

## 2019-07-08 DIAGNOSIS — R2681 Unsteadiness on feet: Secondary | ICD-10-CM

## 2019-07-08 DIAGNOSIS — R2689 Other abnormalities of gait and mobility: Secondary | ICD-10-CM
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
## 2019-07-09 NOTE — Patient Instructions (Signed)
Increasing your activity level is important. Short distances which is walking from one room to another. Work to increase frequency back to prior level. Medium distances are entering & exiting your home or community with limited distances. Start with 4 medium walks which is one outing to one location and increase number of tolerated amounts. Long distance is your highest tolerance for you. Walk until you feel you must rest. Back or leg pain or general fatigue are indicators to maximum tolerance. Monitor by distance or time. Try to walk your BEST distance 1-2 times per day. You should see this increase over time. PT verbally cued on UE support with device can effect distance & time. Both hand support like walker or shopping cart is less energy. Cane then no device. PT recommended timing long walk. Ex. Takes 4 minutes to walk long distance before needing a rest. Once this is not challenging him, add 15-30 seconds to walk. Continue to increase over time.

## 2019-07-10 ENCOUNTER — Encounter: Payer: Medicaid Other | Admitting: Physical Therapy

## 2019-07-15 ENCOUNTER — Ambulatory Visit: Payer: Medicaid Other | Admitting: Physical Therapy

## 2019-07-15 ENCOUNTER — Other Ambulatory Visit: Payer: Self-pay

## 2019-07-15 ENCOUNTER — Encounter: Payer: Self-pay | Admitting: Physical Therapy

## 2019-07-15 DIAGNOSIS — M6281 Muscle weakness (generalized): Secondary | ICD-10-CM

## 2019-07-15 DIAGNOSIS — R2689 Other abnormalities of gait and mobility: Secondary | ICD-10-CM

## 2019-07-15 DIAGNOSIS — R293 Abnormal posture: Secondary | ICD-10-CM | POA: Diagnosis not present

## 2019-07-15 DIAGNOSIS — R2681 Unsteadiness on feet: Secondary | ICD-10-CM
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
## 2019-07-17 ENCOUNTER — Encounter: Payer: Medicaid Other | Admitting: Physical Therapy

## 2019-07-17 ENCOUNTER — Ambulatory Visit: Payer: Medicaid Other | Admitting: Physical Therapy

## 2019-07-23 ENCOUNTER — Ambulatory Visit (INDEPENDENT_AMBULATORY_CARE_PROVIDER_SITE_OTHER): Payer: Medicaid Other | Admitting: Licensed Clinical Social Worker

## 2019-07-23 DIAGNOSIS — F4310 Post-traumatic stress disorder, unspecified: Secondary | ICD-10-CM

## 2019-07-23 DIAGNOSIS — Z8659 Personal history of other mental and behavioral disorders: Secondary | ICD-10-CM

## 2019-07-23 DIAGNOSIS — F0630 Mood disorder due to known physiological condition, unspecified: Secondary | ICD-10-CM

## 2019-07-23 DIAGNOSIS — F331 Major depressive disorder, recurrent, moderate: Secondary | ICD-10-CM | POA: Diagnosis not present
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
## 2019-07-30 ENCOUNTER — Encounter (HOSPITAL_COMMUNITY): Payer: Self-pay | Admitting: Psychiatry

## 2019-07-30 ENCOUNTER — Ambulatory Visit (INDEPENDENT_AMBULATORY_CARE_PROVIDER_SITE_OTHER): Payer: Medicaid Other | Admitting: Psychiatry

## 2019-07-30 DIAGNOSIS — F0630 Mood disorder due to known physiological condition, unspecified: Secondary | ICD-10-CM

## 2019-07-30 DIAGNOSIS — Z8659 Personal history of other mental and behavioral disorders: Secondary | ICD-10-CM | POA: Diagnosis not present

## 2019-07-30 DIAGNOSIS — F4310 Post-traumatic stress disorder, unspecified: Secondary | ICD-10-CM | POA: Diagnosis not present

## 2019-07-30 DIAGNOSIS — F331 Major depressive disorder, recurrent, moderate: Secondary | ICD-10-CM

## 2019-07-30 MED ORDER — CITALOPRAM HYDROBROMIDE 40 MG PO TABS: 40.0000 mg | ORAL_TABLET | Freq: Every day | ORAL | 2 refills | Status: DC
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
## 2019-08-12 ENCOUNTER — Ambulatory Visit (INDEPENDENT_AMBULATORY_CARE_PROVIDER_SITE_OTHER): Payer: Medicaid Other | Admitting: Licensed Clinical Social Worker

## 2019-08-12 DIAGNOSIS — F0630 Mood disorder due to known physiological condition, unspecified: Secondary | ICD-10-CM

## 2019-08-12 DIAGNOSIS — F4310 Post-traumatic stress disorder, unspecified: Secondary | ICD-10-CM | POA: Diagnosis not present

## 2019-08-12 DIAGNOSIS — Z8659 Personal history of other mental and behavioral disorders: Secondary | ICD-10-CM

## 2019-08-12 DIAGNOSIS — F331 Major depressive disorder, recurrent, moderate: Secondary | ICD-10-CM | POA: Diagnosis not present

## 2019-08-12 NOTE — Progress Notes (Signed)
Virtual Visit via Video Note  I connected with Kristopher Simon on 08/12/19 at 11:00 AM EST by a video enabled telemedicine application and verified that I am speaking with the correct person using two identifiers.   I discussed the limitations of evaluation and management by telemedicine and the availability of in person appointments. The patient expressed understanding and agreed to proceed.  The patient was provided an opportunity to ask questions and all were answered. The patient agreed with the plan and demonstrated an understanding of the instructions.   The patient was advised to call back or seek an in-person evaluation if the symptoms worsen or if the condition fails to improve as anticipated.  I provided 54 minutes of non-face-to-face time during this encounter.  THERAPIST PROGRESS NOTE  Session Time: 11:03 AM to 11:57 AM  Participation Level: Active  Behavioral Response: CasualAlertAnxious  Type of Therapy: Individual Therapy  Treatment Goals addressed:  anxiety, panic, problems in sleeping, nightmares, trauma symptoms Interventions: CBT, DBT, Solution Focused, Strength-based, Supportive, Reframing and Other: coping  Summary: Kristopher Simon is a 53 y.o. male who presents with if go out in public, had problem with panic, check out at cash register and lines very long. Describes feeling of claustrophobia, "if can't get out..."after of what will happen,"afraid I will snap and I am a big guy" "afraid that I will push somebody out of the way" and not a mean person. Shares being panicky, sweating with beads of sweat, was far enough in the line that he couldn't leave, describes it as tough. Doesn't happen a lot. Struggled through it. It was awful. Asked questions of cashier. Get mind on something. Therapist pointed out that staying in the situation as helpful strategy to work on panic and distraction as a helpful strategy. Reviewed worksheet and video on anxiety. Reviewed strategy that patient  will try to have a coping statement for the anxiety, not focus on it but use distraction. Also utilize emotional regulation skill of pausing recognizing the emotion helps him have more control of the emotion. Reviewed plan of learning some emotional regulation skills and then getting to work on trauma, will help address one of the triggers feeding into the anxiety. Patient shares see as helpful and specifically related that it  Helps having somebody understand that it is really real, doesn't help to get advice to  walk away, be still. Finished session and patient shared worried about thought process, will probably go out today Christmas shopping, I don't want it to be troubling thing to do. Reviewed plan to practice and that realizing has to practice and help for symptoms to improve and see what works.   Suicidal/Homicidal: No  Therapist Response: Therapist reviewed symptoms, discussed stressors, facilitated expression of thoughts and feelings, provided psychoeducation as to how anxiety and panic are created, emphasize that panic is a false alarm that we perceive something and misinterpret it as dangerousness.  Discussed addressing core sources of anxiety that include physiological arousal and negative self talk.  Reviewed deep breathing and positive self talk such as this is a false alarm and it will pass.  Reviewed giving the anxiety less air to breathe by not focusing on the anxiety.  Reviewed challenging thoughts that create anxiety as well as working on behaviors such as exposing oneself to situations to realize you can cope and not as bad as you thought.  Reviewed emotional regulation skills explaining if you recognize thoughts and feelings going through you, where a feelings gives you a pause  in this awareness creates options, creates a space between the feeling and the need to act and in this pause 1 can choose to modulated through cognitions, physical responses, behaviors.  Reviewed needing to work on  patient's triggers which include past experiences in addition to thoughts, emotions or the situation.  Bided strength based and supportive intervention  Plan: Return again in 2 weeks.2.work on anxiety, hyperarousal symptoms such as anger, trauma.3.Review patient's strategy to manage panic  Diagnosis: Axis I: mood disorder in conditions classified elsewhere, major depressive disorder, recurrent, moderate, history of panic attacks, PTSD   Axis II: No diagnosis    Cordella Register, LCSW 08/12/2019

## 2019-08-26 ENCOUNTER — Ambulatory Visit (HOSPITAL_COMMUNITY): Payer: Medicaid Other | Admitting: Licensed Clinical Social Worker

## 2019-08-31 IMAGING — CR DG KNEE COMPLETE 4+V*L*
4 series · 4 of 4 positions shown · non-contrast
Comparison: None.

CLINICAL DATA: Left leg pain at the amputation site

EXAM:
LEFT KNEE - COMPLETE 4+ VIEW

[knee ap]
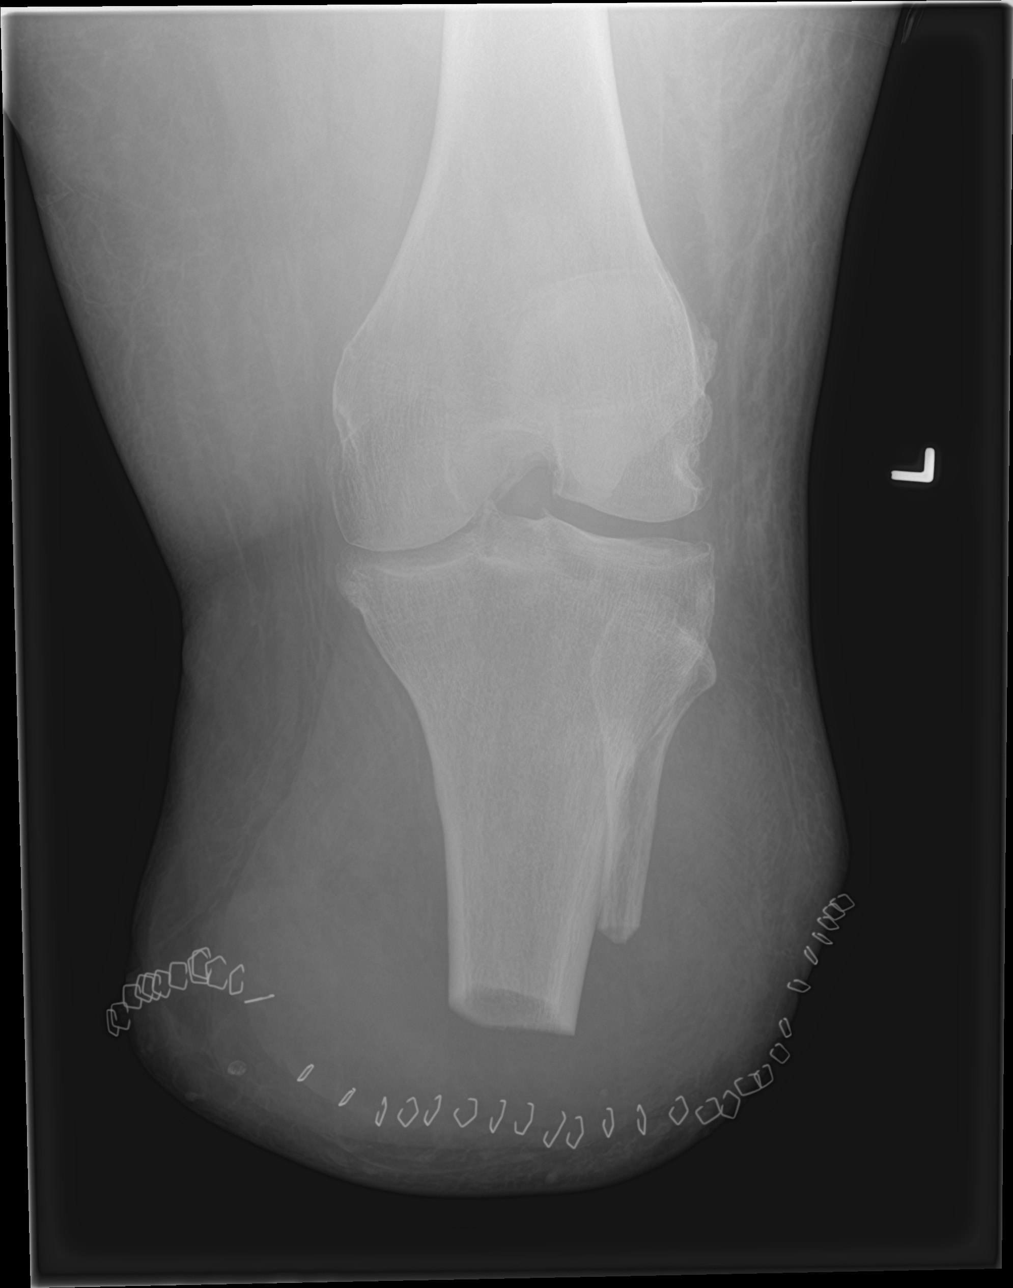

[knee lat]
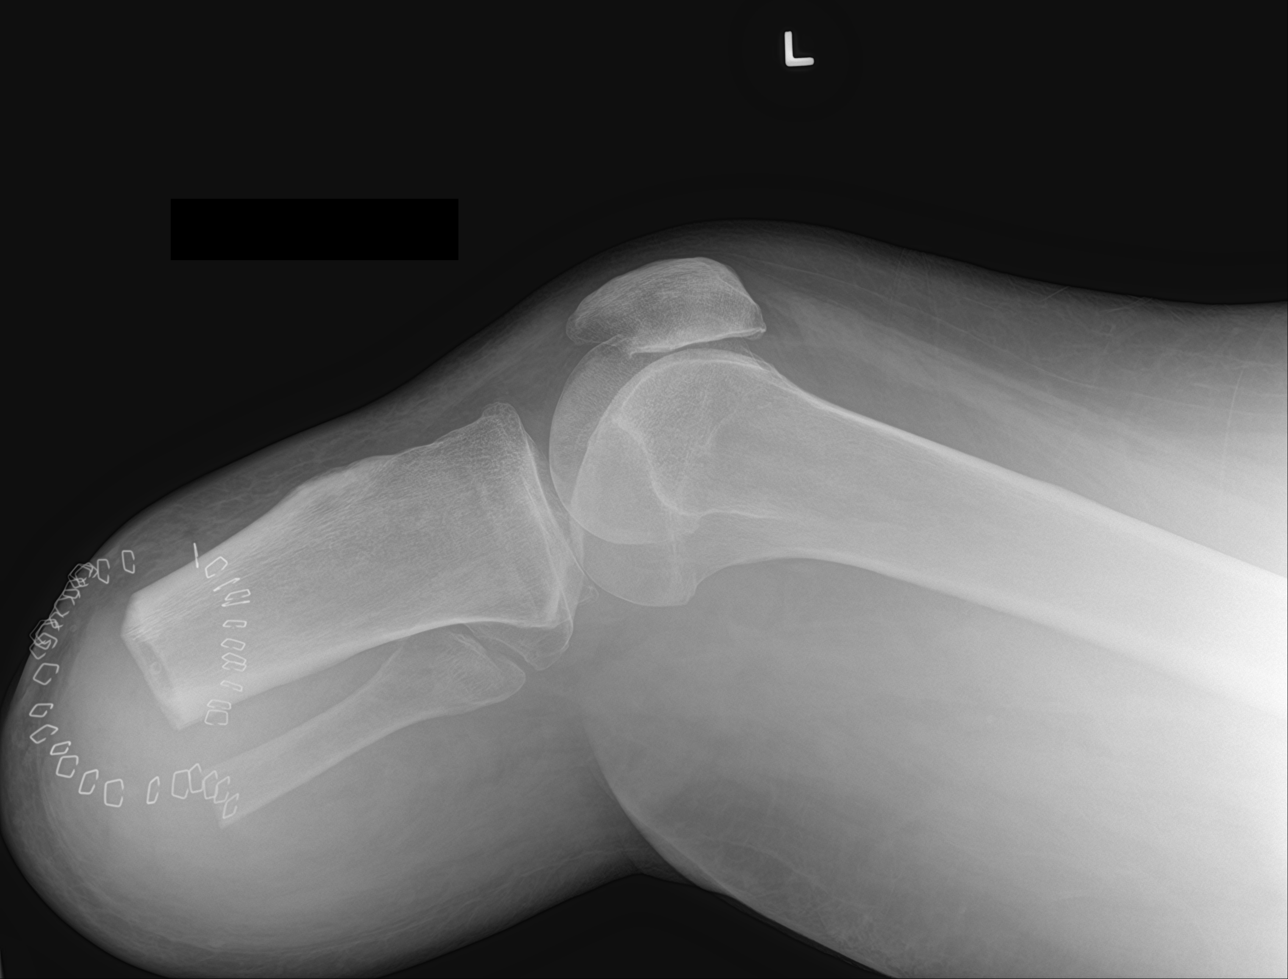

[knee obl (1 of 2)]
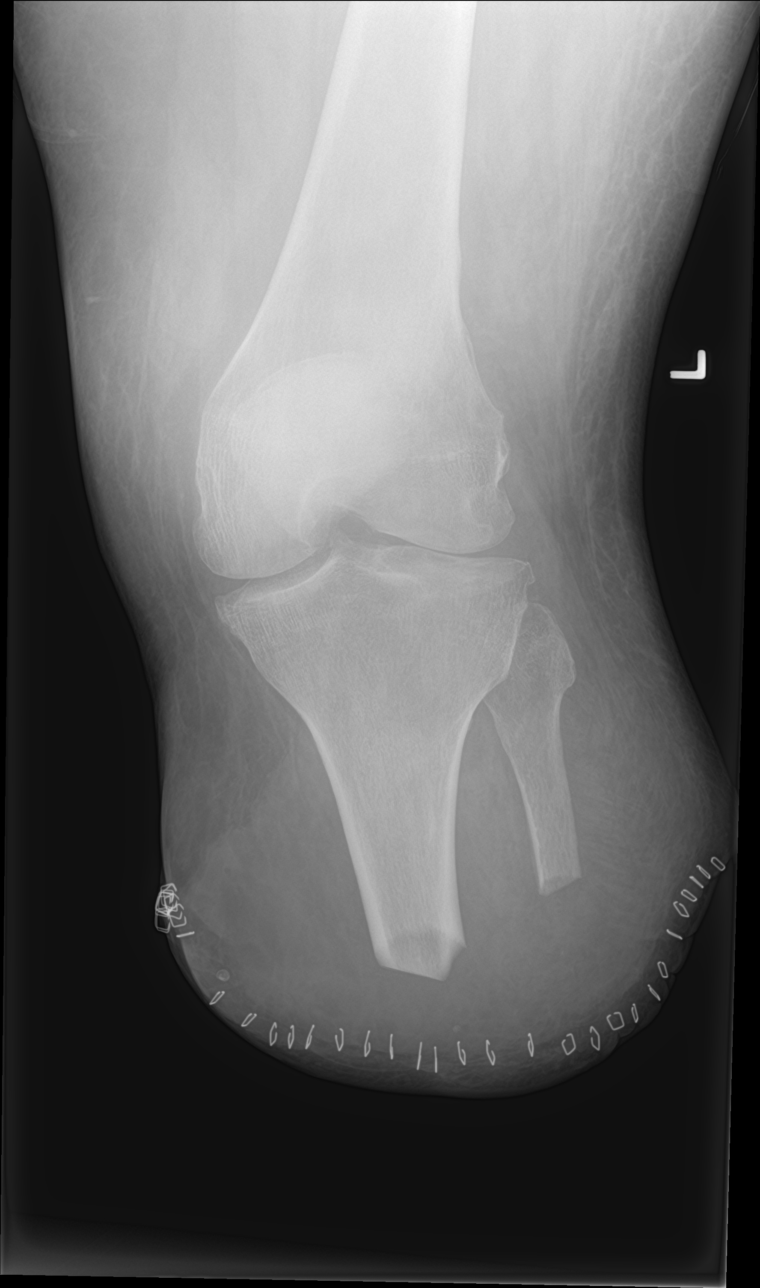

[knee obl (2 of 2)]
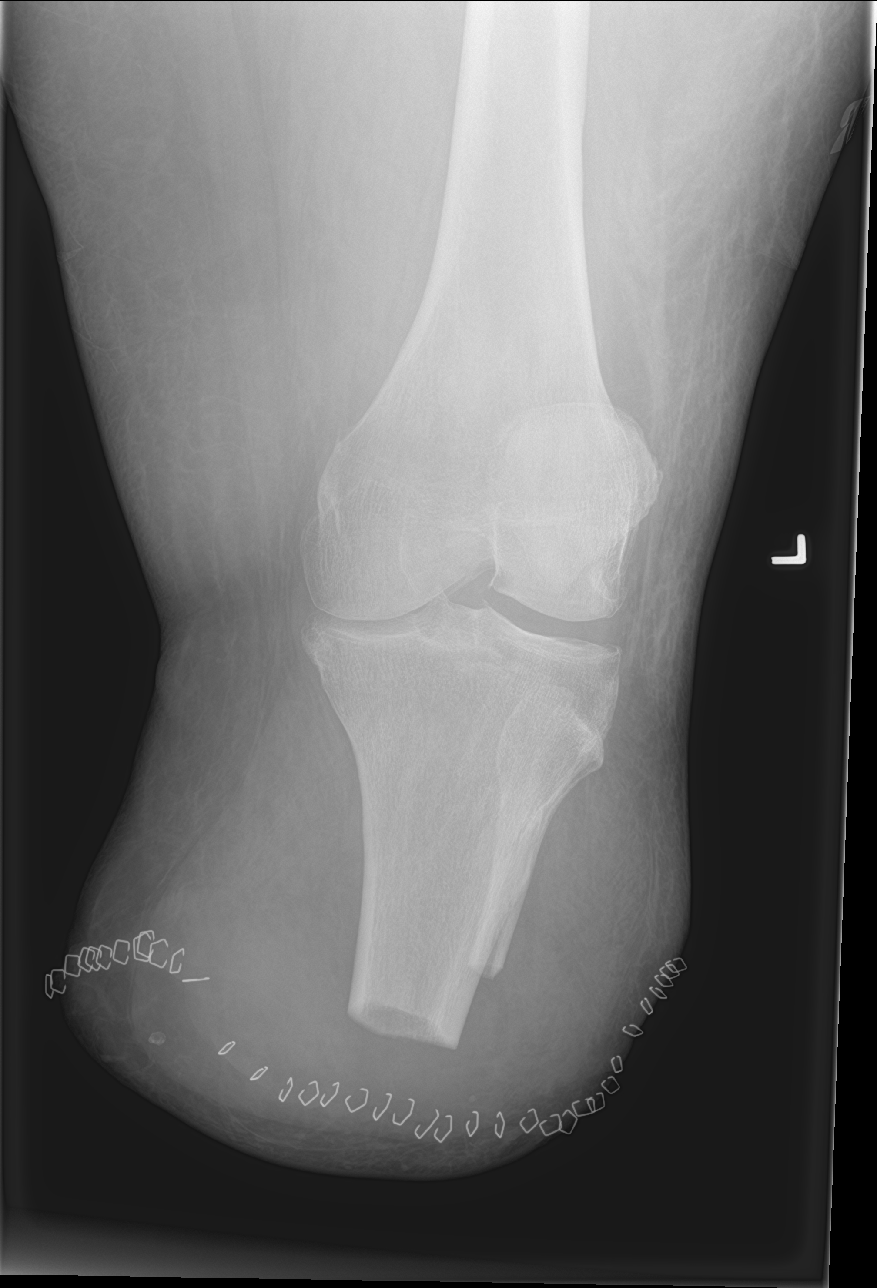

[4 of 4 positions shown; findings below may reference images not displayed]

FINDINGS: Left below the knee amputation with surgical staples in the soft
tissues. No soft tissue emphysema.

No acute fracture or dislocation. No periosteal reaction or bone
destruction. Moderate medial femorotibial compartment joint space
narrowing. Small lateral femorotibial compartment marginal
osteophytes. Mild patellofemoral compartment joint space narrowing.
No knee joint effusion.
IMPRESSION: 1. Below the knee amputation.  No acute osseous abnormality.

## 2019-09-08 ENCOUNTER — Other Ambulatory Visit (INDEPENDENT_AMBULATORY_CARE_PROVIDER_SITE_OTHER): Payer: Self-pay

## 2019-09-09 ENCOUNTER — Other Ambulatory Visit: Payer: Self-pay

## 2019-09-09 ENCOUNTER — Ambulatory Visit (INDEPENDENT_AMBULATORY_CARE_PROVIDER_SITE_OTHER): Payer: Medicaid Other | Admitting: Licensed Clinical Social Worker

## 2019-09-09 DIAGNOSIS — F0630 Mood disorder due to known physiological condition, unspecified: Secondary | ICD-10-CM

## 2019-09-09 DIAGNOSIS — F331 Major depressive disorder, recurrent, moderate: Secondary | ICD-10-CM | POA: Diagnosis not present

## 2019-09-09 DIAGNOSIS — Z8659 Personal history of other mental and behavioral disorders: Secondary | ICD-10-CM

## 2019-09-09 DIAGNOSIS — F4310 Post-traumatic stress disorder, unspecified: Secondary | ICD-10-CM
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
## 2019-09-23 ENCOUNTER — Ambulatory Visit (INDEPENDENT_AMBULATORY_CARE_PROVIDER_SITE_OTHER): Payer: Medicaid Other | Admitting: Licensed Clinical Social Worker

## 2019-09-23 DIAGNOSIS — Z8659 Personal history of other mental and behavioral disorders: Secondary | ICD-10-CM

## 2019-09-23 DIAGNOSIS — F4310 Post-traumatic stress disorder, unspecified: Secondary | ICD-10-CM

## 2019-09-23 DIAGNOSIS — F331 Major depressive disorder, recurrent, moderate: Secondary | ICD-10-CM

## 2019-09-23 DIAGNOSIS — F0630 Mood disorder due to known physiological condition, unspecified: Secondary | ICD-10-CM | POA: Diagnosis not present
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
## 2019-10-02 ENCOUNTER — Other Ambulatory Visit: Payer: Self-pay

## 2019-10-02 ENCOUNTER — Encounter: Payer: Self-pay | Admitting: Family

## 2019-10-02 ENCOUNTER — Ambulatory Visit (INDEPENDENT_AMBULATORY_CARE_PROVIDER_SITE_OTHER): Payer: Medicaid Other | Admitting: Family

## 2019-10-02 VITALS — Ht 77.0 in | Wt >= 6400 oz

## 2019-10-02 DIAGNOSIS — Z89512 Acquired absence of left leg below knee: Secondary | ICD-10-CM | POA: Diagnosis not present
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
## 2019-10-07 ENCOUNTER — Ambulatory Visit (INDEPENDENT_AMBULATORY_CARE_PROVIDER_SITE_OTHER): Payer: Medicaid Other | Admitting: Licensed Clinical Social Worker

## 2019-10-07 DIAGNOSIS — F331 Major depressive disorder, recurrent, moderate: Secondary | ICD-10-CM | POA: Diagnosis not present

## 2019-10-07 DIAGNOSIS — F0630 Mood disorder due to known physiological condition, unspecified: Secondary | ICD-10-CM | POA: Diagnosis not present

## 2019-10-07 DIAGNOSIS — F4310 Post-traumatic stress disorder, unspecified: Secondary | ICD-10-CM | POA: Diagnosis not present

## 2019-10-07 DIAGNOSIS — Z8659 Personal history of other mental and behavioral disorders: Secondary | ICD-10-CM
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
## 2019-10-17 DIAGNOSIS — D72829 Elevated white blood cell count, unspecified: Secondary | ICD-10-CM

## 2019-10-25 ENCOUNTER — Ambulatory Visit (INDEPENDENT_AMBULATORY_CARE_PROVIDER_SITE_OTHER): Payer: Medicaid Other | Admitting: Licensed Clinical Social Worker

## 2019-10-25 DIAGNOSIS — F0630 Mood disorder due to known physiological condition, unspecified: Secondary | ICD-10-CM

## 2019-10-25 DIAGNOSIS — F331 Major depressive disorder, recurrent, moderate: Secondary | ICD-10-CM

## 2019-10-25 DIAGNOSIS — F4310 Post-traumatic stress disorder, unspecified: Secondary | ICD-10-CM | POA: Diagnosis not present

## 2019-10-25 DIAGNOSIS — Z8659 Personal history of other mental and behavioral disorders: Secondary | ICD-10-CM

## 2019-10-25 NOTE — Progress Notes (Signed)
Virtual Visit via Telephone Note  I connected with Kristopher Simon on 10/25/19 at 10:00 AM EST by telephone and verified that I am speaking with the correct person using two identifiers.   I discussed the limitations, risks, security and privacy concerns of performing an evaluation and management service by telephone and the availability of in person appointments. I also discussed with the patient that there may be a patient responsible charge related to this service. The patient expressed understanding and agreed to proceed.   I discussed the assessment and treatment plan with the patient. The patient was provided an opportunity to ask questions and all were answered. The patient agreed with the plan and demonstrated an understanding of the instructions.   The patient was advised to call back or seek an in-person evaluation if the symptoms worsen or if the condition fails to improve as anticipated.  I provided 54 minutes of non-face-to-face time during this encounter.  THERAPIST PROGRESS NOTE  Session Time: 10:01 AM to 10:55 AM  Participation Level: Active  Behavioral Response: CasualAlertAnxious  Type of Therapy: Individual Therapy  Treatment Goals addressed:   anxiety, panic, problems in sleeping, nightmares, trauma symptoms  Interventions: CBT, Solution Focused, Strength-based, Supportive and Other: trauma education and trauma interventions  Summary: Kristopher Simon is a 54 y.o. male who presents with up and down. Trying to use coping skills taught by therapist with some success.  He talks to himself little bit to calm himself down telling himself nothing is going to happen it is going to be okay for fight or flight takes over. Therapist pointed out that this is exactly the strategies she is teaching him to catch it early before gets to extreme and more difficult to manage.  Patient describes an overall wellbeing feeling gets really bad but does go away.  Feelings, in situations where  he is in the car for example he will get a cold feeling he will open the door really fast to get out of the situation.  Related last night playing cards with his friends he pushed them away because they came to close for comfort.  Other situations patient is described where he feels close down may be in a store for example.  Therapist pointed out the symptoms seem to relate to trauma as they are similar to what happened in his trauma where he felt he was trapped and could not get out.  Therapist explained we have different ways to remember trauma not necessarily what happened but we can have emotional memories, physical sensation as memories patient appears to have both emotional and physical sensations.  Reviewed it will be helpful to focus on trauma work.  They laid the ground work up by introducing SUDs scale to identify levels of distress and overwhelming then can use grounding techniques to help to stress them.  Best reviewed grounding techniques as well as introducing visual imagery helping patient to create place of safety.  Patient was able to imagine in detail [facility] describing the loud role of the waves, listening to different sounds cheerful noises, sounds from the ocean, seeing people on the peer trees blowing clothing drying, when strong smell salty when smell likes being both in the sun and shade. Describes it can be trance like.   Suicidal/Homicidal: No  Therapist Response: Therapist reviewed symptoms, facilitated expression of thoughts and feelings, assessed patient making progress on treatment goals of decrease of anxiety as he is able to catch himself earlier before he goes into  fighter flight and talk himself down thus using an intervention to help decrease anxiety.  Reviewed patient's symptoms related to trauma memory and explained to patient you can have different types of trauma memory through emotions, through remembering the incident and it appears patient is having physical  sensations trauma memory when in close spaces.  Began preliminary work to do narrative trauma introducing SUDS so patient will be able to identify temperature when the work becomes too distressing, reviewed grounding that can be used in trauma work to help patient destress and introduced guided imagery of patient developing a peaceful place, patient described [place] that he can go to when trauma work becomes too distressing.  Reviewed importance of doing 15 to 20 minutes relaxation exercises a day to help with decrease of trauma symptoms.  Provided education on guided imagery howIs a relaxation exercise, improve mood status, reduce distress associated with traumatic memory, gain understanding insight into her wisdom and thus introducing patient to another type of relaxation strategy to be used with anxiety and trauma.  Therapist provided strength based and supportive intervention   Plan: Return again in 2 weeks.2.Provide handout for SUDS, handout for informal and formal writing of trauma narrative, handout explaining visual imagery as relaxation. 3.Patient start to do relaxation exercises daily.4.therapist start with narrative work of memory less distressing next session wiht patient  Diagnosis: Axis I:  mood disorder in conditions classified elsewhere, major depressive disorder, recurrent, moderate, history of panic attacks, PTSD    Axis II: No diagnosis    Cordella Register, LCSW 10/25/2019

## 2019-10-28 ENCOUNTER — Ambulatory Visit (INDEPENDENT_AMBULATORY_CARE_PROVIDER_SITE_OTHER): Payer: Medicaid Other | Admitting: Psychiatry

## 2019-10-28 ENCOUNTER — Encounter (HOSPITAL_COMMUNITY): Payer: Self-pay | Admitting: Psychiatry

## 2019-10-28 DIAGNOSIS — F0630 Mood disorder due to known physiological condition, unspecified: Secondary | ICD-10-CM

## 2019-10-28 DIAGNOSIS — Z8659 Personal history of other mental and behavioral disorders: Secondary | ICD-10-CM

## 2019-10-28 DIAGNOSIS — F411 Generalized anxiety disorder: Secondary | ICD-10-CM

## 2019-10-28 DIAGNOSIS — F4310 Post-traumatic stress disorder, unspecified: Secondary | ICD-10-CM

## 2019-10-28 DIAGNOSIS — F331 Major depressive disorder, recurrent, moderate: Secondary | ICD-10-CM | POA: Diagnosis not present

## 2019-10-28 DIAGNOSIS — F410 Panic disorder [episodic paroxysmal anxiety] without agoraphobia: Secondary | ICD-10-CM

## 2019-10-28 MED ORDER — CITALOPRAM HYDROBROMIDE 40 MG PO TABS: 40.0000 mg | ORAL_TABLET | Freq: Every day | ORAL | 2 refills | Status: DC
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
## 2019-11-04 ENCOUNTER — Ambulatory Visit (INDEPENDENT_AMBULATORY_CARE_PROVIDER_SITE_OTHER): Payer: Medicaid Other | Admitting: Licensed Clinical Social Worker

## 2019-11-04 DIAGNOSIS — F331 Major depressive disorder, recurrent, moderate: Secondary | ICD-10-CM

## 2019-11-04 DIAGNOSIS — F4310 Post-traumatic stress disorder, unspecified: Secondary | ICD-10-CM | POA: Diagnosis not present

## 2019-11-04 DIAGNOSIS — Z8659 Personal history of other mental and behavioral disorders: Secondary | ICD-10-CM | POA: Diagnosis not present

## 2019-11-04 DIAGNOSIS — F0630 Mood disorder due to known physiological condition, unspecified: Secondary | ICD-10-CM
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
## 2019-11-18 ENCOUNTER — Ambulatory Visit (HOSPITAL_COMMUNITY): Payer: Medicaid Other | Admitting: Licensed Clinical Social Worker

## 2019-12-03 ENCOUNTER — Other Ambulatory Visit: Payer: Self-pay | Admitting: Sports Medicine

## 2019-12-03 DIAGNOSIS — M79671 Pain in right foot: Secondary | ICD-10-CM

## 2019-12-04 ENCOUNTER — Ambulatory Visit: Payer: Medicaid Other | Admitting: Sports Medicine

## 2019-12-05 DIAGNOSIS — G546 Phantom limb syndrome with pain: Secondary | ICD-10-CM | POA: Insufficient documentation

## 2019-12-05 DIAGNOSIS — F39 Unspecified mood [affective] disorder: Secondary | ICD-10-CM | POA: Insufficient documentation

## 2019-12-05 DIAGNOSIS — L299 Pruritus, unspecified: Secondary | ICD-10-CM | POA: Insufficient documentation

## 2019-12-12 MED ORDER — PRAVASTATIN SODIUM 10 MG PO TABS
20.00 | ORAL_TABLET | ORAL | Status: DC
Start: 2019-12-10 — End: 2019-12-12

## 2019-12-12 MED ORDER — ASPIRIN 81 MG PO TBEC
81.00 | DELAYED_RELEASE_TABLET | ORAL | Status: DC
Start: 2019-12-11 — End: 2019-12-12

## 2019-12-12 MED ORDER — GUAIFENESIN ER 600 MG PO TB12
600.00 | ORAL_TABLET | ORAL | Status: DC
Start: 2019-12-10 — End: 2019-12-12

## 2019-12-12 MED ORDER — GLUCOSE 40 % PO GEL
15.00 | ORAL | Status: DC
Start: ? — End: 2019-12-12

## 2019-12-12 MED ORDER — GABAPENTIN 300 MG PO CAPS
300.00 | ORAL_CAPSULE | ORAL | Status: DC
Start: 2019-12-10 — End: 2019-12-12

## 2019-12-12 MED ORDER — HYDRALAZINE HCL 25 MG PO TABS
25.00 | ORAL_TABLET | ORAL | Status: DC
Start: 2019-12-10 — End: 2019-12-12

## 2019-12-12 MED ORDER — ACETAMINOPHEN 325 MG PO TABS
650.00 | ORAL_TABLET | ORAL | Status: DC
Start: ? — End: 2019-12-12

## 2019-12-12 MED ORDER — PANTOPRAZOLE SODIUM 40 MG PO TBEC
40.00 | DELAYED_RELEASE_TABLET | ORAL | Status: DC
Start: 2019-12-11 — End: 2019-12-12

## 2019-12-12 MED ORDER — TRAZODONE HCL 50 MG PO TABS
50.00 | ORAL_TABLET | ORAL | Status: DC
Start: 2019-12-10 — End: 2019-12-12

## 2019-12-12 MED ORDER — DEXTROSE 10 % IV SOLN
125.00 | INTRAVENOUS | Status: DC
Start: ? — End: 2019-12-12

## 2019-12-12 MED ORDER — CITALOPRAM HYDROBROMIDE 20 MG PO TABS
40.00 | ORAL_TABLET | ORAL | Status: DC
Start: 2019-12-11 — End: 2019-12-12

## 2019-12-12 MED ORDER — LORAZEPAM 1 MG PO TABS
1.00 | ORAL_TABLET | ORAL | Status: DC
Start: ? — End: 2019-12-12

## 2019-12-12 MED ORDER — DSS 100 MG PO CAPS
100.00 | ORAL_CAPSULE | ORAL | Status: DC
Start: ? — End: 2019-12-12

## 2019-12-12 MED ORDER — HYDROXYZINE HCL 25 MG PO TABS
10.00 | ORAL_TABLET | ORAL | Status: DC
Start: 2019-12-10 — End: 2019-12-12

## 2019-12-12 MED ORDER — METOPROLOL TARTRATE 50 MG PO TABS
100.00 | ORAL_TABLET | ORAL | Status: DC
Start: 2019-12-10 — End: 2019-12-12

## 2019-12-12 MED ORDER — AMLODIPINE BESYLATE 5 MG PO TABS
10.00 | ORAL_TABLET | ORAL | Status: DC
Start: 2019-12-11 — End: 2019-12-12

## 2019-12-12 MED ORDER — INSULIN REGULAR HUMAN (CONC) 500 UNIT/ML ~~LOC~~ SOPN
145.00 | PEN_INJECTOR | SUBCUTANEOUS | Status: DC
Start: 2019-12-11 — End: 2019-12-12

## 2019-12-12 MED ORDER — CYCLOBENZAPRINE HCL 10 MG PO TABS
5.00 | ORAL_TABLET | ORAL | Status: DC
Start: ? — End: 2019-12-12

## 2019-12-12 MED ORDER — GEMFIBROZIL 600 MG PO TABS
600.00 | ORAL_TABLET | ORAL | Status: DC
Start: 2019-12-10 — End: 2019-12-12

## 2019-12-12 MED ORDER — INSULIN LISPRO 100 UNIT/ML ~~LOC~~ SOLN
3.00 | SUBCUTANEOUS | Status: DC
Start: 2019-12-10 — End: 2019-12-12

## 2019-12-12 MED ORDER — CLINDAMYCIN HCL 150 MG PO CAPS
300.00 | ORAL_CAPSULE | ORAL | Status: DC
Start: 2019-12-10 — End: 2019-12-12

## 2019-12-12 MED ORDER — GLUCAGON (RDNA) 1 MG IJ KIT
1.00 | PACK | INTRAMUSCULAR | Status: DC
Start: ? — End: 2019-12-12

## 2019-12-12 MED ORDER — HEPARIN SODIUM (PORCINE) 5000 UNIT/ML IJ SOLN
5000.00 | INTRAMUSCULAR | Status: DC
Start: 2019-12-10 — End: 2019-12-12

## 2019-12-12 MED ORDER — HYDRALAZINE HCL 20 MG/ML IJ SOLN
5.00 | INTRAMUSCULAR | Status: DC
Start: ? — End: 2019-12-12

## 2019-12-12 MED ORDER — INSULIN REGULAR HUMAN (CONC) 500 UNIT/ML ~~LOC~~ SOPN
120.00 | PEN_INJECTOR | SUBCUTANEOUS | Status: DC
Start: 2019-12-10 — End: 2019-12-12

## 2019-12-27 ENCOUNTER — Other Ambulatory Visit (INDEPENDENT_AMBULATORY_CARE_PROVIDER_SITE_OTHER): Payer: Self-pay

## 2020-01-31 ENCOUNTER — Other Ambulatory Visit: Payer: Self-pay

## 2020-01-31 ENCOUNTER — Telehealth (INDEPENDENT_AMBULATORY_CARE_PROVIDER_SITE_OTHER): Payer: Medicaid Other | Admitting: Psychiatry

## 2020-01-31 ENCOUNTER — Encounter (HOSPITAL_COMMUNITY): Payer: Self-pay | Admitting: Psychiatry

## 2020-01-31 DIAGNOSIS — F0630 Mood disorder due to known physiological condition, unspecified: Secondary | ICD-10-CM

## 2020-01-31 DIAGNOSIS — Z8659 Personal history of other mental and behavioral disorders: Secondary | ICD-10-CM

## 2020-01-31 DIAGNOSIS — F331 Major depressive disorder, recurrent, moderate: Secondary | ICD-10-CM | POA: Diagnosis not present
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
## 2020-02-20 ENCOUNTER — Ambulatory Visit (INDEPENDENT_AMBULATORY_CARE_PROVIDER_SITE_OTHER): Payer: Medicaid Other | Admitting: Licensed Clinical Social Worker

## 2020-02-20 DIAGNOSIS — F410 Panic disorder [episodic paroxysmal anxiety] without agoraphobia: Secondary | ICD-10-CM

## 2020-02-20 DIAGNOSIS — Z8659 Personal history of other mental and behavioral disorders: Secondary | ICD-10-CM | POA: Diagnosis not present

## 2020-02-20 DIAGNOSIS — F4310 Post-traumatic stress disorder, unspecified: Secondary | ICD-10-CM | POA: Diagnosis not present

## 2020-02-20 DIAGNOSIS — F0630 Mood disorder due to known physiological condition, unspecified: Secondary | ICD-10-CM | POA: Diagnosis not present

## 2020-02-20 DIAGNOSIS — F331 Major depressive disorder, recurrent, moderate: Secondary | ICD-10-CM
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
## 2020-03-19 ENCOUNTER — Ambulatory Visit (HOSPITAL_COMMUNITY): Payer: Medicaid Other | Admitting: Licensed Clinical Social Worker

## 2020-03-26 DIAGNOSIS — U071 COVID-19: Secondary | ICD-10-CM | POA: Insufficient documentation

## 2020-03-27 ENCOUNTER — Other Ambulatory Visit (HOSPITAL_COMMUNITY): Payer: Self-pay | Admitting: Psychiatry

## 2020-04-02 ENCOUNTER — Ambulatory Visit (INDEPENDENT_AMBULATORY_CARE_PROVIDER_SITE_OTHER): Payer: Medicaid Other | Admitting: Licensed Clinical Social Worker

## 2020-04-02 DIAGNOSIS — F4310 Post-traumatic stress disorder, unspecified: Secondary | ICD-10-CM

## 2020-04-02 DIAGNOSIS — F331 Major depressive disorder, recurrent, moderate: Secondary | ICD-10-CM | POA: Diagnosis not present

## 2020-04-02 DIAGNOSIS — Z8659 Personal history of other mental and behavioral disorders: Secondary | ICD-10-CM

## 2020-04-02 DIAGNOSIS — F0630 Mood disorder due to known physiological condition, unspecified: Secondary | ICD-10-CM

## 2020-04-02 NOTE — Progress Notes (Signed)
Virtual Visit via Telephone Note  Therapist-office [place] I connected with Jarrel Knoke on 04/02/20 at  1:00 PM EDT by telephone and verified that I am speaking with the correct person using two identifiers.   I discussed the limitations, risks, security and privacy concerns of performing an evaluation and management service by telephone and the availability of in person appointments. I also discussed with the patient that there may be a patient responsible charge related to this service. The patient expressed understanding and agreed to proceed.   I discussed the assessment and treatment plan with the patient. The patient was provided an opportunity to ask questions and all were answered. The patient agreed with the plan and demonstrated an understanding of the instructions.   The patient was advised to call back or seek an in-person evaluation if the symptoms worsen or if the condition fails to improve as anticipated.  I provided 20 minutes of non-face-to-face time during this encounter.   THERAPIST PROGRESS NOTE  Session Time: 1:00 PM to 1:20 PM  Participation Level: Active  Behavioral Response: CasualAlertEuthymic  Type of Therapy: Individual Therapy  Treatment Goals addressed:  anxiety, panic, problems in sleeping, nightmares, trauma symptoms  Interventions: Solution Focused, Strength-based, Supportive and Other: coping  Summary: Jasdeep Kepner is a 54 y.o. male who presents with going to [place] to play in torment. World series of poker. "Can't  beat it for a vacation." Got another truck. 2018. Looking forward to going there.  Therapist talked about getting away going to places can be very helpful for mental health help [place] to feel better patient agrees.  Patient shared would like to find work but not stable enough body aches and cannot sleep, with Medicaid only can make so much and he needs the insurance.  Glad his health is good. Hurt his knee fell on the stairs. Knew  what to do to treat it. Went to ER a couple days later about a week healed up. Thinks he is doing fine.  Told therapist about [place] some of the things he plans to do that that he will enjoy.  He is getting ready for a trip so had a shorter session and plan is for next time for him to come in and work on trauma.  Therapist related one of the goals of treatment is for him to feel better and enjoy himself so this was a positive sign that he was going on a trip and looking forward to it.  Therapist provided active listening, open questions supportive interventions Suicidal/Homicidal: No  Plan: Return again in 4 weeks.2.  Patient come in for in person session to continue to work on trauma, therapist continue to work with patient on anxiety and effective coping strategies  Diagnosis: Axis I: mood disorder in conditions classified elsewhere, major depressive disorder, recurrent, moderate, history of panic attacks, PTSD    Axis II: No diagnosis    Cordella Register, LCSW 04/02/2020

## 2020-04-12 ENCOUNTER — Other Ambulatory Visit (HOSPITAL_COMMUNITY): Payer: Self-pay | Admitting: Psychiatry

## 2020-05-01 ENCOUNTER — Encounter (HOSPITAL_COMMUNITY): Payer: Self-pay | Admitting: Psychiatry

## 2020-05-01 ENCOUNTER — Telehealth (INDEPENDENT_AMBULATORY_CARE_PROVIDER_SITE_OTHER): Payer: Medicaid Other | Admitting: Psychiatry

## 2020-05-01 DIAGNOSIS — F0630 Mood disorder due to known physiological condition, unspecified: Secondary | ICD-10-CM

## 2020-05-01 DIAGNOSIS — F331 Major depressive disorder, recurrent, moderate: Secondary | ICD-10-CM

## 2020-05-01 DIAGNOSIS — F410 Panic disorder [episodic paroxysmal anxiety] without agoraphobia: Secondary | ICD-10-CM | POA: Diagnosis not present

## 2020-05-01 MED ORDER — CITALOPRAM HYDROBROMIDE 40 MG PO TABS: 40.0000 mg | ORAL_TABLET | Freq: Every day | ORAL | 0 refills | Status: DC
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
## 2020-05-13 ENCOUNTER — Ambulatory Visit (INDEPENDENT_AMBULATORY_CARE_PROVIDER_SITE_OTHER): Payer: Medicaid Other | Admitting: Licensed Clinical Social Worker

## 2020-05-13 DIAGNOSIS — F4310 Post-traumatic stress disorder, unspecified: Secondary | ICD-10-CM | POA: Diagnosis not present

## 2020-05-13 DIAGNOSIS — F0630 Mood disorder due to known physiological condition, unspecified: Secondary | ICD-10-CM | POA: Diagnosis not present

## 2020-05-13 DIAGNOSIS — F331 Major depressive disorder, recurrent, moderate: Secondary | ICD-10-CM | POA: Diagnosis not present

## 2020-05-13 DIAGNOSIS — F410 Panic disorder [episodic paroxysmal anxiety] without agoraphobia: Secondary | ICD-10-CM | POA: Diagnosis not present

## 2020-05-13 NOTE — Progress Notes (Signed)
Virtual Visit via Telephone Note  Therapist-home office [facility] in [place] I connected with Kristopher Simon on 05/13/20 at  1:00 PM EDT by telephone and verified that I am speaking with the correct person using two identifiers.   I discussed the limitations, risks, security and privacy concerns of performing an evaluation and management service by telephone and the availability of in person appointments. I also discussed with the patient that there may be a patient responsible charge related to this service. The patient expressed understanding and agreed to proceed.   I discussed the assessment and treatment plan with the patient. The patient was provided an opportunity to ask questions and all were answered. The patient agreed with the plan and demonstrated an understanding of the instructions.   The patient was advised to call back or seek an in-person evaluation if the symptoms worsen or if the condition fails to improve as anticipated.  I provided 55 minutes of non-face-to-face time during this encounter.  THERAPIST PROGRESS NOTE  Session Time: 1:00 PM to 1:55 PM  Participation Level: Active  Behavioral Response: CasualAlertAnxious and Euthymic  Type of Therapy: Individual Therapy  Treatment Goals addressed:  anxiety, panic, problems in sleeping, nightmares, trauma symptoms Interventions: CBT, Solution Focused, Strength-based, Supportive and Other: coping  Summary: Kristopher Simon is a 54 y.o. male who [place]. Do this every year for 20 years same place. Therapist pointed out having fun doing things he enjoys fits the goals of therapy. Getting to the point of knowing what he can and can't do. Good to spend time with people there. With sister her kids, nephews one with them and one with girlfriend. Usually about 7. Symptoms are pretty good like for them to be better. Being in crowd and groups and things not moving as fast in line are things that he  still would like to see improvement. Getting more patience helps. "Talking to people softness about them and understanding them better." Especially at [facility] or slower places. Keeps busy when in line with looking at things for distraction. Actually gets interested. As far as patience has more now then did. Likes unpredictability and doesn't like routine. Doesn't rush in and out of the store, doesn't do that. Now started to enjoy the  unpredictability and looking at things helps him. Therapist pointed out he is making it more enjoyable helping. Therapist pointed out changing his experience of it and that changing his perspective of it. Changes to how he relates to the experience. Doesn't know what comes and enjoys that.  Therapist points out with letting go is when it is fun. Patient says even if not what your normal expectation is for fun let it happen and realize it isn't bad and harmful. Fun open to experience to allow yourself to have new experiences. "Not always going to the best or the worst either." Introduce himself into conversations that wouldn't have. Unpredictable and enjoyable and liked it. Reviewed direction of therapy. Patient shares helps to express and chance to let it go. Looks forward to sharing things that going through and talking about it. Maybe keep moving him a step further. Therapist part of process of moving forward more and more. Patient shared surprised about the allowing himself to experience things in the moment, the spontaneous moment surprised him that was missing it. Found out that It is ok doesn't have to rush out and can enjoy it. Not what he feared it would be.   Suicidal/Homicidal: No  Therapist Response: Therapist assesses patient making significant progress in terms of attitude shift is helping with his anxiety and trauma.  He is allowing himself to be more spontaneous giving up need to control everything.  In the process he is able to enjoy his life more.  Discussed how  excessive need to control is one of the personality traits that leads to anxiety, often people develop from history of trauma.  Recognize situations are not the same as his trauma so he does not need to control them to keep himself safe through his behavior seems he is able to start to work through that.  Discussed very little if we can control and learning to live comfortably with the unknown knowing it will be all right helpful to adapt to life in patients case actually learning adaptations skills that are very helpful and enjoying the unknown and opening up to the unknown.  Noted as well patient's cultivating patients is helpful for working on excessive need for control.  Discussed other attitudes helpful to work on this is acceptance and trusting that most problems eventually work out.  Does well about other coping strategies such as distraction, challenging distortions in thinking, not avoiding.  Therapist reviewed treatment plan and patient gave consent to complete by phone.  Therapist provided active listening open questions supportive interventions  Plan: Return again in 1-2 months.2.work on mindfulness, coping, decrease in anxiety.   Diagnosis: Axis I:  mood disorder in conditions classified elsewhere, major depressive disorder, recurrent, moderate, history of panic attacks, PTSD    Axis II: No diagnosis    Cordella Register, LCSW 05/13/2020

## 2020-05-28 ENCOUNTER — Other Ambulatory Visit (HOSPITAL_COMMUNITY): Payer: Self-pay

## 2020-05-28 MED ORDER — CITALOPRAM HYDROBROMIDE 40 MG PO TABS: 40.0000 mg | ORAL_TABLET | Freq: Every day | ORAL | 0 refills | Status: DC

## 2020-07-02 ENCOUNTER — Ambulatory Visit (INDEPENDENT_AMBULATORY_CARE_PROVIDER_SITE_OTHER): Payer: Medicaid Other | Admitting: Licensed Clinical Social Worker

## 2020-07-02 DIAGNOSIS — F410 Panic disorder [episodic paroxysmal anxiety] without agoraphobia: Secondary | ICD-10-CM

## 2020-07-02 DIAGNOSIS — F4310 Post-traumatic stress disorder, unspecified: Secondary | ICD-10-CM

## 2020-07-02 DIAGNOSIS — F0630 Mood disorder due to known physiological condition, unspecified: Secondary | ICD-10-CM | POA: Diagnosis not present

## 2020-07-02 DIAGNOSIS — F331 Major depressive disorder, recurrent, moderate: Secondary | ICD-10-CM
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
## 2020-07-30 ENCOUNTER — Telehealth (INDEPENDENT_AMBULATORY_CARE_PROVIDER_SITE_OTHER): Payer: Medicaid Other | Admitting: Psychiatry

## 2020-07-30 ENCOUNTER — Encounter (HOSPITAL_COMMUNITY): Payer: Self-pay | Admitting: Psychiatry

## 2020-07-30 DIAGNOSIS — Z8659 Personal history of other mental and behavioral disorders: Secondary | ICD-10-CM | POA: Diagnosis not present

## 2020-07-30 DIAGNOSIS — F0630 Mood disorder due to known physiological condition, unspecified: Secondary | ICD-10-CM

## 2020-07-30 DIAGNOSIS — F4310 Post-traumatic stress disorder, unspecified: Secondary | ICD-10-CM | POA: Diagnosis not present

## 2020-07-30 MED ORDER — CITALOPRAM HYDROBROMIDE 40 MG PO TABS: 40.0000 mg | ORAL_TABLET | Freq: Every day | ORAL | 0 refills | Status: DC
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
## 2020-08-31 ENCOUNTER — Ambulatory Visit: Payer: Self-pay | Admitting: Orthopedic Surgery

## 2020-09-04 ENCOUNTER — Encounter: Payer: Self-pay | Admitting: Physician Assistant

## 2020-09-04 ENCOUNTER — Ambulatory Visit (INDEPENDENT_AMBULATORY_CARE_PROVIDER_SITE_OTHER): Payer: Medicaid Other | Admitting: Physician Assistant

## 2020-09-04 ENCOUNTER — Other Ambulatory Visit: Payer: Self-pay

## 2020-09-04 DIAGNOSIS — E11610 Type 2 diabetes mellitus with diabetic neuropathic arthropathy: Secondary | ICD-10-CM
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
## 2020-09-08 ENCOUNTER — Ambulatory Visit: Payer: Medicaid Other | Admitting: Physician Assistant

## 2020-09-08 ENCOUNTER — Other Ambulatory Visit (HOSPITAL_COMMUNITY): Payer: Self-pay | Admitting: Psychiatry

## 2020-09-09 ENCOUNTER — Other Ambulatory Visit: Payer: Self-pay

## 2020-09-09 ENCOUNTER — Ambulatory Visit: Payer: Medicaid Other | Admitting: Sports Medicine

## 2020-09-09 ENCOUNTER — Encounter: Payer: Self-pay | Admitting: Sports Medicine

## 2020-09-09 DIAGNOSIS — B351 Tinea unguium: Secondary | ICD-10-CM | POA: Diagnosis not present

## 2020-09-09 DIAGNOSIS — E1142 Type 2 diabetes mellitus with diabetic polyneuropathy: Secondary | ICD-10-CM

## 2020-09-09 DIAGNOSIS — R600 Localized edema: Secondary | ICD-10-CM | POA: Diagnosis not present

## 2020-09-09 DIAGNOSIS — M79674 Pain in right toe(s): Secondary | ICD-10-CM | POA: Diagnosis not present

## 2020-09-09 DIAGNOSIS — Z89512 Acquired absence of left leg below knee: Secondary | ICD-10-CM | POA: Diagnosis not present

## 2020-09-09 DIAGNOSIS — F411 Generalized anxiety disorder: Secondary | ICD-10-CM
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
## 2020-10-29 ENCOUNTER — Encounter (HOSPITAL_COMMUNITY): Payer: Self-pay | Admitting: Psychiatry

## 2020-10-29 ENCOUNTER — Telehealth (INDEPENDENT_AMBULATORY_CARE_PROVIDER_SITE_OTHER): Payer: Medicaid Other | Admitting: Psychiatry

## 2020-10-29 DIAGNOSIS — F0630 Mood disorder due to known physiological condition, unspecified: Secondary | ICD-10-CM | POA: Diagnosis not present

## 2020-10-29 DIAGNOSIS — F4310 Post-traumatic stress disorder, unspecified: Secondary | ICD-10-CM

## 2020-10-29 DIAGNOSIS — Z8659 Personal history of other mental and behavioral disorders: Secondary | ICD-10-CM

## 2020-10-29 MED ORDER — CITALOPRAM HYDROBROMIDE 40 MG PO TABS: 40.0000 mg | ORAL_TABLET | Freq: Every day | ORAL | 0 refills | Status: DC
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
## 2020-12-16 ENCOUNTER — Ambulatory Visit: Payer: Medicaid Other | Admitting: Sports Medicine

## 2020-12-16 ENCOUNTER — Other Ambulatory Visit: Payer: Self-pay

## 2020-12-16 ENCOUNTER — Encounter: Payer: Self-pay | Admitting: Sports Medicine

## 2020-12-16 DIAGNOSIS — E1142 Type 2 diabetes mellitus with diabetic polyneuropathy: Secondary | ICD-10-CM

## 2020-12-16 DIAGNOSIS — B351 Tinea unguium: Secondary | ICD-10-CM | POA: Diagnosis not present

## 2020-12-16 DIAGNOSIS — M79674 Pain in right toe(s): Secondary | ICD-10-CM

## 2020-12-16 DIAGNOSIS — Z89512 Acquired absence of left leg below knee: Secondary | ICD-10-CM
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
## 2020-12-18 ENCOUNTER — Other Ambulatory Visit (HOSPITAL_COMMUNITY): Payer: Self-pay | Admitting: Psychiatry

## 2021-01-28 ENCOUNTER — Encounter (HOSPITAL_COMMUNITY): Payer: Self-pay | Admitting: Psychiatry

## 2021-01-28 ENCOUNTER — Telehealth (INDEPENDENT_AMBULATORY_CARE_PROVIDER_SITE_OTHER): Payer: Medicaid Other | Admitting: Psychiatry

## 2021-01-28 DIAGNOSIS — F4310 Post-traumatic stress disorder, unspecified: Secondary | ICD-10-CM

## 2021-01-28 DIAGNOSIS — F0630 Mood disorder due to known physiological condition, unspecified: Secondary | ICD-10-CM

## 2021-01-28 DIAGNOSIS — Z8659 Personal history of other mental and behavioral disorders: Secondary | ICD-10-CM
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
## 2021-02-03 ENCOUNTER — Encounter: Payer: Self-pay | Admitting: Physician Assistant

## 2021-02-03 ENCOUNTER — Ambulatory Visit (INDEPENDENT_AMBULATORY_CARE_PROVIDER_SITE_OTHER): Payer: Medicaid Other | Admitting: Physician Assistant

## 2021-02-03 DIAGNOSIS — Z89512 Acquired absence of left leg below knee: Secondary | ICD-10-CM
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
## 2021-02-04 ENCOUNTER — Telehealth (HOSPITAL_COMMUNITY): Payer: Medicaid Other | Admitting: Psychiatry

## 2021-03-03 ENCOUNTER — Ambulatory Visit: Payer: Medicaid Other | Admitting: Sports Medicine

## 2021-03-03 ENCOUNTER — Other Ambulatory Visit: Payer: Self-pay

## 2021-03-03 ENCOUNTER — Encounter: Payer: Self-pay | Admitting: Sports Medicine

## 2021-03-03 DIAGNOSIS — E1142 Type 2 diabetes mellitus with diabetic polyneuropathy: Secondary | ICD-10-CM

## 2021-03-03 DIAGNOSIS — M79674 Pain in right toe(s): Secondary | ICD-10-CM

## 2021-03-03 DIAGNOSIS — I739 Peripheral vascular disease, unspecified: Secondary | ICD-10-CM | POA: Diagnosis not present

## 2021-03-03 DIAGNOSIS — Z89512 Acquired absence of left leg below knee: Secondary | ICD-10-CM

## 2021-03-03 DIAGNOSIS — B351 Tinea unguium: Secondary | ICD-10-CM | POA: Diagnosis not present

## 2021-03-03 DIAGNOSIS — L309 Dermatitis, unspecified: Secondary | ICD-10-CM

## 2021-03-03 MED ORDER — TRIAMCINOLONE ACETONIDE 0.1 % EX CREA: 1.0000 "application " | TOPICAL_CREAM | Freq: Two times a day (BID) | CUTANEOUS | 1 refills | Status: DC
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
## 2021-04-08 ENCOUNTER — Other Ambulatory Visit (HOSPITAL_COMMUNITY): Payer: Self-pay | Admitting: Psychiatry

## 2021-04-20 ENCOUNTER — Other Ambulatory Visit (HOSPITAL_COMMUNITY): Payer: Self-pay

## 2021-04-20 MED ORDER — CITALOPRAM HYDROBROMIDE 40 MG PO TABS: 40.0000 mg | ORAL_TABLET | Freq: Every day | ORAL | 0 refills | Status: AC

## 2021-05-14 ENCOUNTER — Telehealth (INDEPENDENT_AMBULATORY_CARE_PROVIDER_SITE_OTHER): Payer: Medicaid Other | Admitting: Psychiatry

## 2021-05-14 ENCOUNTER — Encounter (HOSPITAL_COMMUNITY): Payer: Self-pay | Admitting: Psychiatry

## 2021-05-14 DIAGNOSIS — Z8659 Personal history of other mental and behavioral disorders: Secondary | ICD-10-CM | POA: Diagnosis not present

## 2021-05-14 DIAGNOSIS — F0630 Mood disorder due to known physiological condition, unspecified: Secondary | ICD-10-CM | POA: Diagnosis not present
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
## 2021-06-02 ENCOUNTER — Ambulatory Visit: Payer: Medicaid Other | Admitting: Sports Medicine

## 2021-06-18 ENCOUNTER — Ambulatory Visit: Payer: Medicaid Other | Admitting: Sports Medicine

## 2021-06-23 ENCOUNTER — Ambulatory Visit: Payer: Medicaid Other | Admitting: Sports Medicine

## 2021-06-23 ENCOUNTER — Encounter: Payer: Self-pay | Admitting: Sports Medicine

## 2021-06-23 ENCOUNTER — Other Ambulatory Visit: Payer: Self-pay

## 2021-06-23 DIAGNOSIS — E1142 Type 2 diabetes mellitus with diabetic polyneuropathy: Secondary | ICD-10-CM

## 2021-06-23 DIAGNOSIS — Z89512 Acquired absence of left leg below knee: Secondary | ICD-10-CM

## 2021-06-23 DIAGNOSIS — B351 Tinea unguium: Secondary | ICD-10-CM

## 2021-06-23 DIAGNOSIS — M79674 Pain in right toe(s): Secondary | ICD-10-CM | POA: Diagnosis not present
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
## 2021-06-29 ENCOUNTER — Other Ambulatory Visit (HOSPITAL_COMMUNITY): Payer: Self-pay | Admitting: Psychiatry

## 2021-07-09 ENCOUNTER — Telehealth (HOSPITAL_COMMUNITY): Payer: Medicaid Other | Admitting: Psychiatry

## 2021-09-22 ENCOUNTER — Ambulatory Visit: Payer: Medicaid Other | Admitting: Sports Medicine

## 2021-09-22 ENCOUNTER — Encounter: Payer: Self-pay | Admitting: Sports Medicine

## 2021-09-22 DIAGNOSIS — B351 Tinea unguium: Secondary | ICD-10-CM

## 2021-09-22 DIAGNOSIS — E1142 Type 2 diabetes mellitus with diabetic polyneuropathy: Secondary | ICD-10-CM

## 2021-09-22 DIAGNOSIS — M79674 Pain in right toe(s): Secondary | ICD-10-CM | POA: Diagnosis not present

## 2021-09-22 DIAGNOSIS — Z89512 Acquired absence of left leg below knee: Secondary | ICD-10-CM
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
## 2021-12-22 ENCOUNTER — Ambulatory Visit: Payer: Medicaid Other | Admitting: Sports Medicine

## 2021-12-22 ENCOUNTER — Encounter: Payer: Self-pay | Admitting: Sports Medicine

## 2021-12-22 DIAGNOSIS — B351 Tinea unguium: Secondary | ICD-10-CM | POA: Diagnosis not present

## 2021-12-22 DIAGNOSIS — E1142 Type 2 diabetes mellitus with diabetic polyneuropathy: Secondary | ICD-10-CM

## 2021-12-22 DIAGNOSIS — M79674 Pain in right toe(s): Secondary | ICD-10-CM | POA: Diagnosis not present

## 2021-12-22 DIAGNOSIS — Z89512 Acquired absence of left leg below knee: Secondary | ICD-10-CM
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
## 2022-01-07 ENCOUNTER — Encounter: Payer: Self-pay | Admitting: Internal Medicine

## 2022-02-02 ENCOUNTER — Encounter: Payer: Self-pay | Admitting: Internal Medicine

## 2022-02-02 ENCOUNTER — Other Ambulatory Visit (INDEPENDENT_AMBULATORY_CARE_PROVIDER_SITE_OTHER): Payer: Medicaid Other

## 2022-02-02 ENCOUNTER — Ambulatory Visit: Payer: Medicaid Other | Admitting: Internal Medicine

## 2022-02-02 VITALS — BP 160/86 | HR 84 | Ht 72.75 in | Wt >= 6400 oz

## 2022-02-02 DIAGNOSIS — R1310 Dysphagia, unspecified: Secondary | ICD-10-CM

## 2022-02-02 DIAGNOSIS — K625 Hemorrhage of anus and rectum: Secondary | ICD-10-CM | POA: Diagnosis not present

## 2022-02-02 DIAGNOSIS — D649 Anemia, unspecified: Secondary | ICD-10-CM

## 2022-02-02 DIAGNOSIS — K921 Melena: Secondary | ICD-10-CM

## 2022-02-02 DIAGNOSIS — R1013 Epigastric pain: Secondary | ICD-10-CM

## 2022-02-02 DIAGNOSIS — K219 Gastro-esophageal reflux disease without esophagitis: Secondary | ICD-10-CM

## 2022-02-02 DIAGNOSIS — K5900 Constipation, unspecified: Secondary | ICD-10-CM | POA: Diagnosis not present

## 2022-02-02 LAB — CBC WITH DIFFERENTIAL/PLATELET
Basophils Absolute: 0.1 10*3/uL (ref 0.0–0.1)
Basophils Relative: 0.9 % (ref 0.0–3.0)
Eosinophils Absolute: 0.2 10*3/uL (ref 0.0–0.7)
Eosinophils Relative: 1.6 % (ref 0.0–5.0)
HCT: 36.9 % — ABNORMAL LOW (ref 39.0–52.0)
Hemoglobin: 12 g/dL — ABNORMAL LOW (ref 13.0–17.0)
Lymphocytes Relative: 9.5 % — ABNORMAL LOW (ref 12.0–46.0)
Lymphs Abs: 1.1 10*3/uL (ref 0.7–4.0)
MCHC: 32.4 g/dL (ref 30.0–36.0)
MCV: 87.9 fl (ref 78.0–100.0)
Monocytes Absolute: 1 10*3/uL (ref 0.1–1.0)
Monocytes Relative: 8.5 % (ref 3.0–12.0)
Neutro Abs: 9.6 10*3/uL — ABNORMAL HIGH (ref 1.4–7.7)
Neutrophils Relative %: 79.5 % — ABNORMAL HIGH (ref 43.0–77.0)
Platelets: 403 10*3/uL — ABNORMAL HIGH (ref 150.0–400.0)
RBC: 4.2 Mil/uL — ABNORMAL LOW (ref 4.22–5.81)
RDW: 14.3 % (ref 11.5–15.5)
WBC: 12.1 10*3/uL — ABNORMAL HIGH (ref 4.0–10.5)

## 2022-02-02 LAB — IBC + FERRITIN
Ferritin: 44.3 ng/mL (ref 22.0–322.0)
Iron: 71 ug/dL (ref 42–165)
Saturation Ratios: 20.2 % (ref 20.0–50.0)
TIBC: 351.4 ug/dL (ref 250.0–450.0)
Transferrin: 251 mg/dL (ref 212.0–360.0)

## 2022-02-02 LAB — C-REACTIVE PROTEIN: CRP: 1.4 mg/dL (ref 0.5–20.0)

## 2022-02-02 LAB — TSH: TSH: 1.81 u[IU]/mL (ref 0.35–5.50)

## 2022-02-02 LAB — LIPASE: Lipase: 31 U/L (ref 11.0–59.0)

## 2022-02-02 MED ORDER — OMEPRAZOLE 40 MG PO CPDR: 40.0000 mg | DELAYED_RELEASE_CAPSULE | Freq: Every day | ORAL | 3 refills | Status: DC

## 2022-02-02 NOTE — Patient Instructions (Addendum)
If you are age 56 or older, your body mass index should be between 23-30. Your Body mass index is 57.79 kg/m. If this is out of the aforementioned range listed, please consider follow up with your Primary Care Provider.  If you are age 22 or younger, your body mass index should be between 19-25. Your Body mass index is 57.79 kg/m. If this is out of the aformentioned range listed, please consider follow up with your Primary Care Provider.   Your provider has requested that you go to the basement level for lab work before leaving today. Press "B" on the elevator. The lab is located at the first door on the left as you exit the elevator.   You have been scheduled for an endoscopy and colonoscopy. Please follow the written instructions given to you at your visit today. Please pick up your prep supplies at the pharmacy within the next 1-3 days. If you use inhalers (even only as needed), please bring them with you on the day of your procedure.   Drink 8 cups of water a day and walk 30 minutes a day.  Please purchase the following medications over the counter and take as directed: Fiber supplement such as Benefiber- use as directed daily Miralax: Take as  directed up to 3 times a day to achieve regular bowel movements   The [facility] providers would like to encourage you to use [facility] to communicate with providers for non-urgent requests or questions.  Due to long hold times on the telephone, sending your provider a message by [facility] may be a faster and more efficient way to get a response.  Please allow 48 business hours for a response.  Please remember that this is for non-urgent requests.  _______________________________________________________  Thank you for entrusting me with your care and for choosing [facility], Dr. Christia Reading

## 2022-02-02 NOTE — Progress Notes (Signed)
Chief Complaint: Rectal bleeding  HPI : 56 year old male with history of DM, GERD, CKD, CVA, CAD s/p PCI, OSA, PTSD, prior DVT presents with rectal bleeding.  He has been seeing rectal bleeding over the last year. The rectal bleeding comes-and-goes and is bright red blood. The water looks cloudy around the BM, and the BMs can be black on occasion. Denies rectal pain or itching. He has had issues with constipation. He on average has a BM once every 2-3 days. He is not using any laxatives. He has been having epigastric ab pain over the last year. This pain is intermittent. The pain gets better after having a BM. Denies use of blood thinners. He does have GERD. Endorses chest burning and regurgitation, feeling like he has a sour stomach with foul smelling burping. He does take omeprazole or Nexium daily. Endorses nausea and vomiting. He is on [facility] which does cause him to become nauseous. Endorses some dysphagia. Sometimes food gets hung up in his chest. Denies dysphagia to liquids. Denies prior colonoscopy or EGD. Has family history of polyps in mom and half-brother. Denies family history of GI cancers. Denies alcohol use.  Wt Readings from Last 3 Encounters:  02/02/22 (!) 435 lb (197.3 kg)  10/02/19 (!) 400 lb (181.4 kg)  05/08/19 (!) 400 lb (181.4 kg)   Past Medical History:  Diagnosis Date   Acute renal failure ([place]) 01/2012   CKD   Anxiety    Arthritis    hands   Cellulitis    hx left leg    Cellulitis of left lower extremity 07/06/2014   Claustrophobia    Concussion    Coronary artery disease    Dehiscence of amputation stump (HCC)    Depression    Diabetes mellitus 1995   Type II   DVT (deep venous thrombosis) ([employer]) 2014   left leg   Fibromyalgia    Foot abscess, left 09/12/2018   GERD (gastroesophageal reflux disease)    Hypertension 1998   Neuropathy    feet- below knee   Panic attacks    Peripheral vascular disease (HCC)    left leg, right neck   PTSD  (post-traumatic stress disorder)    Stroke (HCC)    x 3  memory loss - left hand- and left lower extremity-shakes at times- closes by it self- 06/2017- last one   Wears glasses    blind in left eye, readers    Past Surgical History:  Procedure Laterality Date   AMPUTATION Left 09/14/2018   Procedure: LEFT BELOW KNEE AMPUTATION;  Surgeon: Newt Minion, MD;  Location: [place];  Service: Orthopedics;  Laterality: Left;   CARDIAC CATHETERIZATION  2009   stent   COLONOSCOPY     EYE SURGERY Left    repair torn retina - blind in left eye   HIP FRACTURE SURGERY Right 2014   metal in hip   STUMP REVISION Left 10/19/2018   STUMP REVISION Left 10/19/2018   Procedure: REVISION LEFT BELOW KNEE AMPUTATION;  Surgeon: Newt Minion, MD;  Location: [facility] [place];  Service: Orthopedics;  Laterality: Left;   STUMP REVISION Left 11/30/2018   Procedure: REVISION LEFT BELOW KNEE AMPUTATION;  Surgeon: Newt Minion, MD;  Location: [place];  Service: Orthopedics;  Laterality: Left;  REVISION LEFT BELOW KNEE AMPUTATION   STUMP REVISION Left 12/07/2018   Procedure: REVISION LEFT BELOW KNEE AMPUTATION;  Surgeon: Newt Minion, MD;  Location: [place];  Service: Orthopedics;  Laterality: Left;  TONSILLECTOMY     WISDOM TOOTH EXTRACTION     Family History  Problem Relation Age of Onset   Hypertension Mother    Dementia Father    Alzheimer's disease Father    Social History   Tobacco Use   Smoking status: Never   Smokeless tobacco: Never  Vaping Use   Vaping Use: Never used  Substance Use Topics   Alcohol use: Not Currently    Alcohol/week: 1.0 standard drink of alcohol    Types: 1 Glasses of wine per week    Comment: occasional    Drug use: No   Current Outpatient Medications  Medication Sig Dispense Refill   acetaminophen (TYLENOL) 325 MG tablet Take 1-2 tablets (325-650 mg total) by mouth every 4 (four) hours as needed for mild pain.     amLODipine (NORVASC) 5 MG tablet Take 1 tablet (5 mg total) by mouth  daily. 30 tablet 0   aspirin EC 81 MG tablet Take 1 tablet (81 mg total) by mouth daily. 30 tablet 0   atorvastatin (LIPITOR) 40 MG tablet Take 40 mg by mouth daily.     BAQSIMI ONE PACK 3 MG/DOSE POWD SMARTSIG:1 Spray(s) Both Nares PRN     citalopram (CELEXA) 40 MG tablet Take 1 tablet (40 mg total) by mouth daily. 90 tablet 0   Continuous Blood Gluc Receiver (DEXCOM G6 RECEIVER) DEVI Use as directed for continuous glucose monitoring.     Continuous Blood Gluc Sensor (DEXCOM G6 SENSOR) MISC Inject 1 sensor to the skin every 10 days for continuous glucose monitoring.     Continuous Blood Gluc Transmit (DEXCOM G6 TRANSMITTER) MISC Use as directed for continuous glucose monitoring. Reuse transmitter for 90 days then discard and replace.     cyclobenzaprine (FLEXERIL) 5 MG tablet Take 1 tablet (5 mg total) by mouth 3 (three) times daily as needed for muscle spasms. (Patient taking differently: Take 5 mg by mouth 3 (three) times daily. ) 45 tablet 0   esomeprazole (NEXIUM) 40 MG capsule Take 1 capsule (40 mg total) by mouth every morning. 30 capsule 0   ferrous gluconate (FERGON) 324 MG tablet Take 1 tablet (324 mg total) by mouth 3 (three) times daily with meals. 90 tablet 3   furosemide (LASIX) 20 MG tablet TAKE 1 TABLET BY MOUTH ONCE DAILY FOR FLUID AND FOR BLOOD PRESSURE     gabapentin (NEURONTIN) 300 MG capsule Take 1 capsule (300 mg total) by mouth 3 (three) times daily. 90 capsule 0   gemfibrozil (LOPID) 600 MG tablet Take 1 tablet (600 mg total) by mouth 2 (two) times daily before a meal.     hydrALAZINE (APRESOLINE) 25 MG tablet Take 1 tablet (25 mg total) by mouth every 8 (eight) hours. 90 tablet 0   hydrocerin (EUCERIN) CREA Apply 1 application topically 2 (two) times daily. To dry skin on right foot/leg  0   hydrOXYzine (ATARAX/VISTARIL) 10 MG tablet Take 1 tablet (10 mg total) by mouth 3 (three) times daily. 90 tablet 0   insulin aspart (NOVOLOG) 100 UNIT/ML injection Inject 20 Units into  the skin 3 (three) times daily with meals. (Patient taking differently: Inject 30 Units into the skin 3 (three) times daily before meals. ) 10 mL 11   insulin degludec (TRESIBA FLEXTOUCH) 100 UNIT/ML SOPN FlexTouch Pen Inject 77 Units into the skin 2 (two) times daily.     Insulin Pen Needle (BD PEN NEEDLE NANO U/F) 32G X 4 MM MISC Use to inject insulin 3  times daily     insulin regular human CONCENTRATED (HUMULIN R U-500 KWIKPEN) 500 UNIT/ML kwikpen Inject 135 units in the AM with breakfast, inject 90 units in the PM with dinner, approximately 12 hours apart     metoprolol tartrate 75 MG TABS Take 75 mg by mouth 2 (two) times daily. 60 tablet 0   nitroGLYCERIN (NITROSTAT) 0.4 MG SL tablet Place under the tongue.     NUCYNTA 100 MG TABS Take 1 tablet (100 mg total) by mouth every 6 (six) hours. After a week or so --then try weaning to as needed as pain gets better. (Patient taking differently: Take 100 mg by mouth every 6 (six) hours. ) 120 tablet 0   OZEMPIC, 0.25 OR 0.5 MG/DOSE, 2 MG/1.5ML SOPN Inject into the skin.     polyethylene glycol (MIRALAX / GLYCOLAX) packet Take 17 g by mouth 2 (two) times daily. 14 each 0   pravastatin (PRAVACHOL) 20 MG tablet Take 20 mg by mouth at bedtime.      saccharomyces boulardii (FLORASTOR) 250 MG capsule Take 1 capsule (250 mg total) by mouth 2 (two) times daily. 30 capsule 0   traZODone (DESYREL) 50 MG tablet Take 0.5-1 tablets (25-50 mg total) by mouth at bedtime as needed for sleep. (Patient taking differently: Take 50 mg by mouth at bedtime as needed for sleep. ) 15 tablet 0   triamcinolone cream (KENALOG) 0.1 % Apply 1 application topically 2 (two) times daily. 80 g 1   VICTOZA 18 MG/3ML SOPN Inject 0.3 mLs (1.8 mg total) into the skin daily.  5   No current facility-administered medications for this visit.   Allergies  Allergen Reactions   Sulfamethoxazole-Trimethoprim Anaphylaxis and Swelling     (BACTRIM) tongue swells   Pregabalin Swelling     SWELLING REACTION UNSPECIFIED    Liraglutide Nausea Only   Magnesium Swelling   Review of Systems: All systems reviewed and negative except where noted in HPI.   Physical Exam: Ht 6' 0.75" (1.848 m) Comment: height measured without shoes  Wt (!) 435 lb (197.3 kg)   BMI 57.79 kg/m  Constitutional: Pleasant,well-developed, male in no acute distress. HEENT: Normocephalic and atraumatic. Conjunctivae are normal. No scleral icterus. Cardiovascular: Normal rate, regular rhythm.  Pulmonary/chest: Effort normal and breath sounds normal. No wheezing, rales or rhonchi. Abdominal: Soft, nondistended, nontender. Bowel sounds active throughout. There are no masses palpable. No hepatomegaly. Extremities: Left AKA Neurological: Alert and oriented to person place and time. Skin: Skin is warm and dry. No rashes noted. Psychiatric: Normal mood and affect. Behavior is normal.  Labs 11/2019: CBC with low Hb of 12.7.  Labs 12/2021: CMP with elevated BUN of 31 and elevated Cr of 2.74.  TTE 09/13/18: Study Conclusions  - Left ventricle: The cavity size was normal. There was moderate    concentric hypertrophy. Systolic function was normal. The    estimated ejection fraction was in the range of 50% to 55%. Wall    motion was normal; there were no regional wall motion    abnormalities. The study is indeterminate for the evaluation of    LV diastolic function.  - Aortic valve: Trileaflet; mildly thickened, mildly calcified    leaflets. There was no regurgitation.  - Mitral valve: Calcified annulus. There was trivial regurgitation.  - Right ventricle: Systolic function was normal.  - Atrial septum: No defect or patent foramen ovale was identified  ASSESSMENT AND PLAN: Rectal bleeding Possible melena Constipation Epigastric abdominal pain Dysphagia GERD Anemia Patient  presents with rectal bleeding, possible melena, constipation, epigastric ab pain, and dysphagia for the last year. Will check some of  his labs today in the setting of concern for GI bleeding. Will institute some therapies for constipation. He has been on PPI for GERD but would like a formal prescription for this. Will also plan for EGD and colonoscopy to evaluate for sources of GI blood loss and dysphagia.  - Check CBC, ferritin/IBC, lipase, TSH, CRP - Encourage 8 cups of water, walk 30 minutes per day, take a daily fiber supplement - Start daily Miralax - Start omeprazole 40 mg QD - EGD/colonoscopy [facility]  Christia Reading, MD  I spent 62 minutes of time, including in depth chart review, independent review of results as outlined above, communicating results with the patient directly, face-to-face time with the patient, coordinating care, ordering studies and medications as appropriate, and documentation.

## 2022-02-13 DIAGNOSIS — K625 Hemorrhage of anus and rectum: Secondary | ICD-10-CM | POA: Insufficient documentation

## 2022-03-08 ENCOUNTER — Telehealth: Payer: Self-pay | Admitting: Orthopedic Surgery

## 2022-03-08 NOTE — Telephone Encounter (Signed)
Lm on vm per dpr okay. Needing him to call back to make an appt to be seen. It has been over one year since he has been seen in office. Per insurance, new Rx has to be w/I 6 mos of office visit. I told him whomever answers the phone can schedule this apt for him.

## 2022-03-08 NOTE — Telephone Encounter (Signed)
Pt called stating he need new script for [facility] for prostatic. Please call pt about this matter at [phone].

## 2022-03-21 ENCOUNTER — Ambulatory Visit (INDEPENDENT_AMBULATORY_CARE_PROVIDER_SITE_OTHER): Payer: Medicaid Other | Admitting: Podiatry

## 2022-03-21 DIAGNOSIS — Z91199 Patient's noncompliance with other medical treatment and regimen due to unspecified reason: Secondary | ICD-10-CM

## 2022-03-21 NOTE — Progress Notes (Signed)
No show

## 2022-03-24 ENCOUNTER — Encounter: Payer: Self-pay | Admitting: Orthopedic Surgery

## 2022-03-24 ENCOUNTER — Ambulatory Visit: Payer: Medicaid Other | Admitting: Orthopedic Surgery

## 2022-03-24 DIAGNOSIS — Z89512 Acquired absence of left leg below knee: Secondary | ICD-10-CM | POA: Diagnosis not present
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
## 2022-04-04 ENCOUNTER — Emergency Department (HOSPITAL_COMMUNITY): Payer: Medicaid Other

## 2022-04-04 ENCOUNTER — Inpatient Hospital Stay (HOSPITAL_COMMUNITY): Payer: Medicaid Other

## 2022-04-04 ENCOUNTER — Other Ambulatory Visit: Payer: Self-pay

## 2022-04-04 ENCOUNTER — Inpatient Hospital Stay (HOSPITAL_COMMUNITY)
Admission: EM | Admit: 2022-04-04 | Discharge: 2022-04-08 | DRG: 291 | Disposition: A | Discharge status: Home / Self Care | Payer: Medicaid Other | Attending: Internal Medicine | Admitting: Internal Medicine

## 2022-04-04 ENCOUNTER — Encounter (HOSPITAL_COMMUNITY): Payer: Self-pay

## 2022-04-04 DIAGNOSIS — Z888 Allergy status to other drugs, medicaments and biological substances status: Secondary | ICD-10-CM | POA: Diagnosis not present

## 2022-04-04 DIAGNOSIS — Z8249 Family history of ischemic heart disease and other diseases of the circulatory system: Secondary | ICD-10-CM

## 2022-04-04 DIAGNOSIS — Z86718 Personal history of other venous thrombosis and embolism: Secondary | ICD-10-CM | POA: Diagnosis not present

## 2022-04-04 DIAGNOSIS — E1165 Type 2 diabetes mellitus with hyperglycemia: Secondary | ICD-10-CM | POA: Diagnosis present

## 2022-04-04 DIAGNOSIS — Z89512 Acquired absence of left leg below knee: Secondary | ICD-10-CM | POA: Diagnosis not present

## 2022-04-04 DIAGNOSIS — N184 Chronic kidney disease, stage 4 (severe): Secondary | ICD-10-CM | POA: Diagnosis present

## 2022-04-04 DIAGNOSIS — I160 Hypertensive urgency: Secondary | ICD-10-CM | POA: Diagnosis present

## 2022-04-04 DIAGNOSIS — F419 Anxiety disorder, unspecified: Secondary | ICD-10-CM | POA: Diagnosis present

## 2022-04-04 DIAGNOSIS — M797 Fibromyalgia: Secondary | ICD-10-CM | POA: Diagnosis present

## 2022-04-04 DIAGNOSIS — E669 Obesity, unspecified: Secondary | ICD-10-CM | POA: Diagnosis present

## 2022-04-04 DIAGNOSIS — E1151 Type 2 diabetes mellitus with diabetic peripheral angiopathy without gangrene: Secondary | ICD-10-CM | POA: Diagnosis present

## 2022-04-04 DIAGNOSIS — Z951 Presence of aortocoronary bypass graft: Secondary | ICD-10-CM | POA: Diagnosis not present

## 2022-04-04 DIAGNOSIS — Z8673 Personal history of transient ischemic attack (TIA), and cerebral infarction without residual deficits: Secondary | ICD-10-CM

## 2022-04-04 DIAGNOSIS — E1122 Type 2 diabetes mellitus with diabetic chronic kidney disease: Secondary | ICD-10-CM | POA: Diagnosis present

## 2022-04-04 DIAGNOSIS — J9601 Acute respiratory failure with hypoxia: Secondary | ICD-10-CM

## 2022-04-04 DIAGNOSIS — I5033 Acute on chronic diastolic (congestive) heart failure: Secondary | ICD-10-CM

## 2022-04-04 DIAGNOSIS — Z6841 Body Mass Index (BMI) 40.0 and over, adult: Secondary | ICD-10-CM | POA: Diagnosis not present

## 2022-04-04 DIAGNOSIS — F32A Depression, unspecified: Secondary | ICD-10-CM | POA: Diagnosis present

## 2022-04-04 DIAGNOSIS — E785 Hyperlipidemia, unspecified: Secondary | ICD-10-CM | POA: Diagnosis present

## 2022-04-04 DIAGNOSIS — Z882 Allergy status to sulfonamides status: Secondary | ICD-10-CM | POA: Diagnosis not present

## 2022-04-04 DIAGNOSIS — I2510 Atherosclerotic heart disease of native coronary artery without angina pectoris: Secondary | ICD-10-CM | POA: Diagnosis present

## 2022-04-04 DIAGNOSIS — Z794 Long term (current) use of insulin: Secondary | ICD-10-CM

## 2022-04-04 DIAGNOSIS — Z7982 Long term (current) use of aspirin: Secondary | ICD-10-CM

## 2022-04-04 DIAGNOSIS — D72829 Elevated white blood cell count, unspecified: Secondary | ICD-10-CM | POA: Diagnosis present

## 2022-04-04 DIAGNOSIS — Z881 Allergy status to other antibiotic agents status: Secondary | ICD-10-CM | POA: Diagnosis not present

## 2022-04-04 DIAGNOSIS — Z86711 Personal history of pulmonary embolism: Secondary | ICD-10-CM

## 2022-04-04 DIAGNOSIS — M7989 Other specified soft tissue disorders: Secondary | ICD-10-CM | POA: Diagnosis not present

## 2022-04-04 DIAGNOSIS — Z7985 Long-term (current) use of injectable non-insulin antidiabetic drugs: Secondary | ICD-10-CM

## 2022-04-04 DIAGNOSIS — G4730 Sleep apnea, unspecified: Secondary | ICD-10-CM | POA: Diagnosis present

## 2022-04-04 DIAGNOSIS — I130 Hypertensive heart and chronic kidney disease with heart failure and stage 1 through stage 4 chronic kidney disease, or unspecified chronic kidney disease: Principal | ICD-10-CM | POA: Diagnosis present

## 2022-04-04 DIAGNOSIS — E119 Type 2 diabetes mellitus without complications: Principal | ICD-10-CM

## 2022-04-04 DIAGNOSIS — K219 Gastro-esophageal reflux disease without esophagitis: Secondary | ICD-10-CM | POA: Diagnosis present

## 2022-04-04 DIAGNOSIS — Z833 Family history of diabetes mellitus: Secondary | ICD-10-CM

## 2022-04-04 DIAGNOSIS — Z79899 Other long term (current) drug therapy: Secondary | ICD-10-CM

## 2022-04-04 DIAGNOSIS — H5462 Unqualified visual loss, left eye, normal vision right eye: Secondary | ICD-10-CM | POA: Diagnosis present

## 2022-04-04 HISTORY — DX: Acute on chronic diastolic (congestive) heart failure: I50.33

## 2022-04-04 HISTORY — DX: Hypertensive urgency: I16.0

## 2022-04-04 HISTORY — DX: Acute respiratory failure with hypoxia: J96.01

## 2022-04-04 LAB — TROPONIN I (HIGH SENSITIVITY)
Troponin I (High Sensitivity): 17 ng/L (ref <18)
Troponin I (High Sensitivity): 20 ng/L — ABNORMAL HIGH (ref <18)

## 2022-04-04 LAB — CBC WITH DIFFERENTIAL/PLATELET
Abs Immature Granulocytes: 0.09 10*3/uL — ABNORMAL HIGH (ref 0.00–0.07)
Basophils Absolute: 0.1 10*3/uL (ref 0.0–0.1)
Basophils Relative: 0 %
Eosinophils Absolute: 0.1 10*3/uL (ref 0.0–0.5)
Eosinophils Relative: 1 %
HCT: 32.6 % — ABNORMAL LOW (ref 39.0–52.0)
Hemoglobin: 10.3 g/dL — ABNORMAL LOW (ref 13.0–17.0)
Immature Granulocytes: 1 %
Lymphocytes Relative: 4 %
Lymphs Abs: 0.6 10*3/uL — ABNORMAL LOW (ref 0.7–4.0)
MCH: 28.7 pg (ref 26.0–34.0)
MCHC: 31.6 g/dL (ref 30.0–36.0)
MCV: 90.8 fL (ref 80.0–100.0)
Monocytes Absolute: 1.7 10*3/uL — ABNORMAL HIGH (ref 0.1–1.0)
Monocytes Relative: 10 %
Neutro Abs: 13.5 10*3/uL — ABNORMAL HIGH (ref 1.7–7.7)
Neutrophils Relative %: 84 %
Platelets: 343 10*3/uL (ref 150–400)
RBC: 3.59 MIL/uL — ABNORMAL LOW (ref 4.22–5.81)
RDW: 14.2 % (ref 11.5–15.5)
WBC: 16 10*3/uL — ABNORMAL HIGH (ref 4.0–10.5)
nRBC: 0 % (ref 0.0–0.2)

## 2022-04-04 LAB — BRAIN NATRIURETIC PEPTIDE: B Natriuretic Peptide: 98.3 pg/mL (ref 0.0–100.0)

## 2022-04-04 LAB — RENAL FUNCTION PANEL
Albumin: 2.6 g/dL — ABNORMAL LOW (ref 3.5–5.0)
Anion gap: 7 (ref 5–15)
BUN: 37 mg/dL — ABNORMAL HIGH (ref 6–20)
CO2: 22 mmol/L (ref 22–32)
Calcium: 8.3 mg/dL — ABNORMAL LOW (ref 8.9–10.3)
Chloride: 108 mmol/L (ref 98–111)
Creatinine, Ser: 2.88 mg/dL — ABNORMAL HIGH (ref 0.61–1.24)
GFR, Estimated: 25 mL/min — ABNORMAL LOW (ref >60)
Glucose, Bld: 328 mg/dL — ABNORMAL HIGH (ref 70–99)
Phosphorus: 3.5 mg/dL (ref 2.5–4.6)
Potassium: 4 mmol/L (ref 3.5–5.1)
Sodium: 137 mmol/L (ref 135–145)

## 2022-04-04 LAB — CBG MONITORING, ED
Glucose-Capillary: 362 mg/dL — ABNORMAL HIGH (ref 70–99)
Glucose-Capillary: 343 mg/dL — ABNORMAL HIGH (ref 70–99)
Glucose-Capillary: 256 mg/dL — ABNORMAL HIGH (ref 70–99)

## 2022-04-04 LAB — BASIC METABOLIC PANEL
Anion gap: 11 (ref 5–15)
BUN: 34 mg/dL — ABNORMAL HIGH (ref 6–20)
CO2: 18 mmol/L — ABNORMAL LOW (ref 22–32)
Calcium: 8.4 mg/dL — ABNORMAL LOW (ref 8.9–10.3)
Chloride: 104 mmol/L (ref 98–111)
Creatinine, Ser: 2.96 mg/dL — ABNORMAL HIGH (ref 0.61–1.24)
GFR, Estimated: 24 mL/min — ABNORMAL LOW (ref >60)
Glucose, Bld: 299 mg/dL — ABNORMAL HIGH (ref 70–99)
Potassium: 4 mmol/L (ref 3.5–5.1)
Sodium: 133 mmol/L — ABNORMAL LOW (ref 135–145)

## 2022-04-04 LAB — GLUCOSE, CAPILLARY: Glucose-Capillary: 364 mg/dL — ABNORMAL HIGH (ref 70–99)

## 2022-04-04 LAB — ECHOCARDIOGRAM COMPLETE
Area-P 1/2: 3.46 cm2
Height: 72 in
S' Lateral: 4 cm
Weight: 6948.9 oz

## 2022-04-04 LAB — PROCALCITONIN: Procalcitonin: <0.1 ng/mL

## 2022-04-04 MED ORDER — PANTOPRAZOLE SODIUM 40 MG PO TBEC
40.0000 mg | DELAYED_RELEASE_TABLET | Freq: Every day | ORAL | Status: DC
  Administered 2022-04-04 – 2022-04-08 (×5): 40 mg via ORAL
  Filled 2022-04-04 (×5): qty 1

## 2022-04-04 MED ORDER — ASPIRIN 81 MG PO TBEC
81.0000 mg | DELAYED_RELEASE_TABLET | Freq: Every day | ORAL | Status: DC
  Administered 2022-04-04 – 2022-04-08 (×5): 81 mg via ORAL
  Filled 2022-04-04 (×6): qty 1

## 2022-04-04 MED ORDER — LABETALOL HCL 5 MG/ML IV SOLN: 10.0000 mg | INTRAVENOUS | Status: DC | PRN

## 2022-04-04 MED ORDER — INSULIN GLARGINE-YFGN 100 UNIT/ML ~~LOC~~ SOLN
50.0000 [IU] | Freq: Two times a day (BID) | SUBCUTANEOUS | Status: DC
  Administered 2022-04-04 – 2022-04-05 (×3): 50 [IU] via SUBCUTANEOUS
  Filled 2022-04-04 (×4): qty 0.5

## 2022-04-04 MED ORDER — GEMFIBROZIL 600 MG PO TABS
600.0000 mg | ORAL_TABLET | Freq: Two times a day (BID) | ORAL | Status: DC
  Administered 2022-04-04 – 2022-04-08 (×8): 600 mg via ORAL
  Filled 2022-04-04 (×8): qty 1

## 2022-04-04 MED ORDER — LABETALOL HCL 5 MG/ML IV SOLN
10.0000 mg | INTRAVENOUS | Status: DC | PRN
  Administered 2022-04-05: 10 mg via INTRAVENOUS
  Filled 2022-04-04: qty 4

## 2022-04-04 MED ORDER — GUAIFENESIN 100 MG/5ML PO LIQD
5.0000 mL | ORAL | Status: DC | PRN
  Administered 2022-04-04: 5 mL via ORAL
  Filled 2022-04-04: qty 5

## 2022-04-04 MED ORDER — CITALOPRAM HYDROBROMIDE 20 MG PO TABS
40.0000 mg | ORAL_TABLET | Freq: Every day | ORAL | Status: DC
  Administered 2022-04-04 – 2022-04-08 (×5): 40 mg via ORAL
  Filled 2022-04-04 (×2): qty 2
  Filled 2022-04-04: qty 4
  Filled 2022-04-04 (×2): qty 2

## 2022-04-04 MED ORDER — AMLODIPINE BESYLATE 5 MG PO TABS
5.0000 mg | ORAL_TABLET | Freq: Every day | ORAL | Status: DC
  Administered 2022-04-04: 5 mg via ORAL
  Filled 2022-04-04 (×2): qty 1

## 2022-04-04 MED ORDER — ATORVASTATIN CALCIUM 40 MG PO TABS
40.0000 mg | ORAL_TABLET | Freq: Every day | ORAL | Status: DC
  Administered 2022-04-04 – 2022-04-08 (×5): 40 mg via ORAL
  Filled 2022-04-04 (×6): qty 1

## 2022-04-04 MED ORDER — INSULIN ASPART 100 UNIT/ML IJ SOLN
0.0000 [IU] | Freq: Three times a day (TID) | INTRAMUSCULAR | Status: DC
  Administered 2022-04-04: 5 [IU] via SUBCUTANEOUS
  Administered 2022-04-04 (×2): 9 [IU] via SUBCUTANEOUS
  Administered 2022-04-05 (×2): 3 [IU] via SUBCUTANEOUS
  Administered 2022-04-05 – 2022-04-06 (×2): 2 [IU] via SUBCUTANEOUS
  Administered 2022-04-06 (×2): 1 [IU] via SUBCUTANEOUS
  Administered 2022-04-07: 3 [IU] via SUBCUTANEOUS
  Administered 2022-04-07 (×2): 2 [IU] via SUBCUTANEOUS
  Administered 2022-04-08: 1 [IU] via SUBCUTANEOUS

## 2022-04-04 MED ORDER — METOPROLOL TARTRATE 100 MG PO TABS
100.0000 mg | ORAL_TABLET | Freq: Two times a day (BID) | ORAL | Status: DC
  Administered 2022-04-04 – 2022-04-06 (×5): 100 mg via ORAL
  Filled 2022-04-04 (×6): qty 1

## 2022-04-04 MED ORDER — HYDRALAZINE HCL 50 MG PO TABS
50.0000 mg | ORAL_TABLET | Freq: Three times a day (TID) | ORAL | Status: DC
Start: 2022-04-04 — End: 2022-04-07
  Administered 2022-04-04 – 2022-04-07 (×11): 50 mg via ORAL
  Filled 2022-04-04 (×3): qty 1
  Filled 2022-04-04: qty 2
  Filled 2022-04-04: qty 1
  Filled 2022-04-04: qty 2
  Filled 2022-04-04 (×4): qty 1
  Filled 2022-04-04: qty 2

## 2022-04-04 MED ORDER — FUROSEMIDE 10 MG/ML IJ SOLN
40.0000 mg | Freq: Two times a day (BID) | INTRAMUSCULAR | Status: DC
  Administered 2022-04-04 – 2022-04-06 (×4): 40 mg via INTRAVENOUS
  Filled 2022-04-04 (×4): qty 4

## 2022-04-04 MED ORDER — FUROSEMIDE 10 MG/ML IJ SOLN
80.0000 mg | Freq: Once | INTRAMUSCULAR | Status: AC
  Administered 2022-04-04: 80 mg via INTRAVENOUS
  Filled 2022-04-04: qty 8

## 2022-04-04 MED ORDER — SODIUM CHLORIDE 0.9% FLUSH
3.0000 mL | Freq: Two times a day (BID) | INTRAVENOUS | Status: DC
  Administered 2022-04-04 – 2022-04-07 (×8): 3 mL via INTRAVENOUS

## 2022-04-04 MED ORDER — HEPARIN SODIUM (PORCINE) 5000 UNIT/ML IJ SOLN
5000.0000 [IU] | Freq: Three times a day (TID) | INTRAMUSCULAR | Status: DC
  Administered 2022-04-04 – 2022-04-08 (×13): 5000 [IU] via SUBCUTANEOUS
  Filled 2022-04-04 (×13): qty 1

## 2022-04-04 MED ORDER — PERFLUTREN LIPID MICROSPHERE
1.0000 mL | INTRAVENOUS | Status: AC | PRN
  Administered 2022-04-04: 2 mL via INTRAVENOUS

## 2022-04-04 MED ORDER — GABAPENTIN 300 MG PO CAPS
300.0000 mg | ORAL_CAPSULE | Freq: Three times a day (TID) | ORAL | Status: DC
  Administered 2022-04-04 – 2022-04-06 (×6): 300 mg via ORAL
  Filled 2022-04-04 (×6): qty 1

## 2022-04-04 MED ORDER — INSULIN ASPART 100 UNIT/ML IJ SOLN
6.0000 [IU] | Freq: Three times a day (TID) | INTRAMUSCULAR | Status: DC
  Administered 2022-04-04 – 2022-04-08 (×12): 6 [IU] via SUBCUTANEOUS

## 2022-04-04 MED ORDER — HYDRALAZINE HCL 25 MG PO TABS: 75.0000 mg | ORAL_TABLET | Freq: Two times a day (BID) | ORAL | Status: DC

## 2022-04-04 MED ORDER — SENNOSIDES-DOCUSATE SODIUM 8.6-50 MG PO TABS: 1.0000 | ORAL_TABLET | Freq: Every evening | ORAL | Status: DC | PRN

## 2022-04-04 MED ORDER — SODIUM CHLORIDE 3 % IN NEBU
4.0000 mL | INHALATION_SOLUTION | Freq: Two times a day (BID) | RESPIRATORY_TRACT | Status: AC
  Administered 2022-04-04 – 2022-04-06 (×3): 4 mL via RESPIRATORY_TRACT
  Filled 2022-04-04 (×4): qty 4

## 2022-04-04 MED ORDER — ACETAMINOPHEN 650 MG RE SUPP: 650.0000 mg | Freq: Four times a day (QID) | RECTAL | Status: DC | PRN

## 2022-04-04 MED ORDER — ACETAMINOPHEN 325 MG PO TABS: 650.0000 mg | ORAL_TABLET | Freq: Four times a day (QID) | ORAL | Status: DC | PRN

## 2022-04-04 MED ORDER — TRAZODONE HCL 50 MG PO TABS: 50.0000 mg | ORAL_TABLET | Freq: Every evening | ORAL | Status: DC | PRN

## 2022-04-04 MED ORDER — INSULIN REGULAR HUMAN (CONC) 500 UNIT/ML ~~LOC~~ SOPN: 140.0000 [IU] | PEN_INJECTOR | SUBCUTANEOUS | Status: DC

## 2022-04-04 MED ORDER — INSULIN ASPART 100 UNIT/ML IJ SOLN
0.0000 [IU] | Freq: Every day | INTRAMUSCULAR | Status: DC
  Administered 2022-04-04: 3 [IU] via SUBCUTANEOUS

## 2022-04-04 NOTE — Progress Notes (Signed)
Progress Note   Patient: Kristopher Simon ZCH:[id number] DOB: July 05, 1966 DOA: 04/04/2022     0 DOS: the patient was seen and examined on 04/04/2022   Brief hospital course: 55 y.o M h/o BMI 59, chronic diastolic CHF, hypertension, CKD stage IV, CAD status post CABG, insulin-dependent diabetes mellitus, admitted 8/13 for SOB, x 2 weeks + 30 pound weight gain in the past month, and has developed orthopnea over the past few days. He reports a history of DVT approximately 10 years ago that followed a surgery.  He is no longer anticoagulated. On EMS arrival, the patient was found to have systolic blood pressure of 210 and oxygen saturation of 84% on room air. 8/14 diuresing well, TTE Pending, on 3L [place]  Assessment and Plan:  1. Acute on chronic diastolic CHF; acute hypoxic respiratory failure  - Presents with SOB at rest after 2 wks of progressive DOE, leg swelling, wt gain, and orthopnea   - BNP is normal but CHF findings on CXR and history/exam most suggestive of CHF  - EF was preserved on TTE in January 2020, repeat TTE pending - s/p 80 mg IV Lasix in ED, continue diuresis as tolerated monitor renal function daily, electrolytes - Continue diuresis with IV Lasix, monitor wt and I/Os, update echo      2. Hypertensive urgency  - SBP as high as 203 in ED, improved with IV Lasix  - Continue diuresis, continue hydralazine, Toprol, and Norvasc      3. CAD  - No anginal complaints  - Continue ASA, Lipitor, Toprol     4. IDDM  - A1c was 8.0% in May 2023  - Continue CBG checks and insulin     5. CKD IV  - SCr is 2.96 in ED, up from 2.74 in May 2023  - Renally-dose medications, follow closely while diuresing    6. Hx of CVA  - Continue ASA 81 and Lipitor     7. Leukocytosis  - Appears chronic, he is afebrile  - Radiology raises question of pneumonia on CXR but hx and exam more suggestive of acute CHF  - Procalcitonin pending, watch off of abx for now      DVT prophylaxis: sq heparin  Code  Status: Full  Level of Care:  Level of care: Progressive Family Communication: none present  Disposition Plan:  Patient is from: home  Anticipated d/c is to: TBD Anticipated d/c date is: 8/17 or 04/08/22  Patient currently: Pending improvement in respiratory status, BP control, stable labs  Consults called: None  Admission status: Inpatient       Subjective: NAEON Diuresing well 3L [place] currently SPO2 95-97% NO CP/SOB.   Physical Exam: Vitals:   04/04/22 1030 04/04/22 1045 04/04/22 1100 04/04/22 1115  BP: (!) 168/68 (!) 156/63 (!) 159/60 (!) 145/56  Pulse: 76 73 72 71  Resp: (!) 23 (!) 22 (!) 26 18  Temp:      TempSrc:      SpO2: 94% 96% 97% 97%  Weight:      Height:       Constitutional: NAD, calm  Eyes: PERTLA, lids and conjunctivae normal ENMT: Mucous membranes are moist. Posterior pharynx clear of any exudate or lesions.   Neck: supple, no masses  Respiratory: Dyspneic with speech, no wheezing. Symmetric chest wall expansion.  Cardiovascular: S1 & S2 heard, regular rate and rhythm. LE pitting edema with serous weeping.  Abdomen: No distension, no tenderness, soft. Bowel sounds active.  Musculoskeletal: no clubbing /  cyanosis. S/p left BKA.   Skin: no significant rashes, lesions, ulcers. Warm, dry, well-perfused. Neurologic: CN 2-12 grossly intact. Moving all extremities. Alert and oriented.  Psychiatric: Pleasant. Cooperative.    Data Reviewed:  There are no new results to review at this time.  Family Communication: none available  Disposition: Status is: Inpatient Remains inpatient appropriate because: CHF excarbation, hypoxia  Planned Discharge Destination: Home    Time spent: 25 minutes  Author: Vanna Scotland, MD 04/04/2022 12:44 PM  For on call review [email or web address].

## 2022-04-04 NOTE — ED Provider Notes (Signed)
[facility] EMERGENCY DEPARTMENT Provider Note   CSN: [id number] Arrival date & time: 04/04/22  0122     History  Chief Complaint  Patient presents with   Chest Pain   Shortness of Breath    Kristopher Simon is a 56 y.o. male.  56 year old male who presents the ER today with shortness of breath.  Patient states that he had about 30 to 40 pounds of weight gain over the last month.  He has a left AKA but no significant swelling in his right leg states it is at least twice as big as it normally is and is weeping fluid.  He states that he was going to travel this weekend and he did not take his Lasix 1 day because of that.  Over the last 3 days he has had persistent dyspnea on exertion dyspnea with lying flat.  Limited chest pain with it some cough with it but no fevers or productive cough.   Chest Pain Associated symptoms: shortness of breath   Shortness of Breath Associated symptoms: chest pain        Home Medications Prior to Admission medications   Medication Sig Start Date End Date Taking? Authorizing Provider  acetaminophen (TYLENOL) 325 MG tablet Take 1-2 tablets (325-650 mg total) by mouth every 4 (four) hours as needed for mild pain. 09/26/18   Love, Ivan Anchors, PA-C  amLODipine (NORVASC) 5 MG tablet Take 1 tablet (5 mg total) by mouth daily. 09/27/18   Love, Ivan Anchors, PA-C  aspirin EC 81 MG tablet Take 1 tablet (81 mg total) by mouth daily. 09/26/18   Love, Ivan Anchors, PA-C  atorvastatin (LIPITOR) 40 MG tablet Take 40 mg by mouth daily. 10/14/21   [provider]  BAQSIMI ONE PACK 3 MG/DOSE POWD SMARTSIG:1 Spray(s) Both Nares PRN 11/03/21   [provider]  citalopram (CELEXA) 40 MG tablet Take 1 tablet (40 mg total) by mouth daily. 04/20/21   [name], MD  Continuous Blood Gluc Receiver (DEXCOM G6 RECEIVER) DEVI Use as directed for continuous glucose monitoring. 06/23/21   [provider]  Continuous Blood Gluc Sensor (DEXCOM G6  SENSOR) MISC Inject 1 sensor to the skin every 10 days for continuous glucose monitoring. 07/28/20   [provider]  Continuous Blood Gluc Transmit (DEXCOM G6 TRANSMITTER) MISC Use as directed for continuous glucose monitoring. Reuse transmitter for 90 days then discard and replace. 07/28/20   [provider]  cyclobenzaprine (FLEXERIL) 5 MG tablet Take 1 tablet (5 mg total) by mouth 3 (three) times daily as needed for muscle spasms. Patient taking differently: Take 5 mg by mouth 3 (three) times daily. 09/26/18   Love, Ivan Anchors, PA-C  esomeprazole (NEXIUM) 40 MG capsule Take 1 capsule (40 mg total) by mouth every morning. 09/26/18   Love, Ivan Anchors, PA-C  ferrous gluconate (FERGON) 324 MG tablet Take 1 tablet (324 mg total) by mouth 3 (three) times daily with meals. 12/11/18   Rayburn, Neta Mends, PA-C  furosemide (LASIX) 20 MG tablet TAKE 1 TABLET BY MOUTH ONCE DAILY FOR FLUID AND FOR BLOOD PRESSURE 03/03/20   [provider]  gabapentin (NEURONTIN) 300 MG capsule Take 1 capsule (300 mg total) by mouth 3 (three) times daily. 09/26/18   Love, Ivan Anchors, PA-C  gemfibrozil (LOPID) 600 MG tablet Take 1 tablet (600 mg total) by mouth 2 (two) times daily before a meal. 09/26/18   Love, Ivan Anchors, PA-C  hydrALAZINE (APRESOLINE) 50 MG tablet Take  50 mg by mouth 3 (three) times daily. 01/28/22   [provider]  hydrocerin (EUCERIN) CREA Apply 1 application topically 2 (two) times daily. To dry skin on right foot/leg 09/26/18   Love, Pamela S, PA-C  Insulin Pen Needle (BD PEN NEEDLE NANO U/F) 32G X 4 MM MISC Use to inject insulin 3 times daily 07/23/19   [provider]  insulin regular human CONCENTRATED (HUMULIN R U-500 KWIKPEN) 500 UNIT/ML kwikpen Inject 135 units in the AM with breakfast, inject 90 units in the PM with dinner, approximately 12 hours apart 04/23/19   [provider]  Lanolin Alcohol WAX Apply topically. 09/26/18   [provider]  metoprolol  tartrate 75 MG TABS Take 75 mg by mouth 2 (two) times daily. 09/26/18   Love, Ivan Anchors, PA-C  MOUNJARO 7.5 MG/0.5ML Pen Inject 7.5 mg into the skin once a week. 01/31/22   [provider]  nitroGLYCERIN (NITROSTAT) 0.4 MG SL tablet Place under the tongue. Patient not taking: Reported on 02/02/2022 03/26/20   [provider]  omeprazole (PRILOSEC) 40 MG capsule Take 1 capsule (40 mg total) by mouth daily. 02/02/22   Sharyn Creamer, MD  traZODone (DESYREL) 50 MG tablet Take 0.5-1 tablets (25-50 mg total) by mouth at bedtime as needed for sleep. Patient taking differently: Take 50 mg by mouth at bedtime as needed for sleep. 09/26/18   Love, Ivan Anchors, PA-C  triamcinolone cream (KENALOG) 0.1 % Apply 1 application topically 2 (two) times daily. Patient taking differently: Apply 1 application  topically as needed. 03/03/21   Landis Martins, DPM      Allergies    Bactrim [sulfamethoxazole-trimethoprim], Lyrica [pregabalin], Liraglutide, and Magnesium    Review of Systems   Review of Systems  Respiratory:  Positive for shortness of breath.   Cardiovascular:  Positive for chest pain.    Physical Exam Updated Vital Signs BP (!) 173/70   Pulse 86   Temp 99.2 F (37.3 C) (Oral)   Resp 18   Ht 6' (1.829 m)   Wt (!) 197 kg   SpO2 97%   BMI 58.90 kg/m  Physical Exam Vitals and nursing note reviewed.  Constitutional:      Appearance: He is well-developed.  HENT:     Head: Normocephalic and atraumatic.  Neck:     Vascular: No JVD.  Cardiovascular:     Rate and Rhythm: Tachycardia present.  Pulmonary:     Effort: Pulmonary effort is normal. No respiratory distress.     Breath sounds: Decreased breath sounds (likely secondary to body habitus) and rales present.  Abdominal:     General: There is no distension.  Musculoskeletal:        General: Normal range of motion.     Cervical back: Normal range of motion.     Right lower leg: Tenderness present. Edema present.     Comments:  Left AKA  Neurological:     Mental Status: He is alert.     ED Results / Procedures / Treatments   Labs (all labs ordered are listed, but only abnormal results are displayed) Labs Reviewed  CBC WITH DIFFERENTIAL/PLATELET - Abnormal; Notable for the following components:      Result Value   WBC 16.0 (*)    RBC 3.59 (*)    Hemoglobin 10.3 (*)    HCT 32.6 (*)    Neutro Abs 13.5 (*)    Lymphs Abs 0.6 (*)    Monocytes Absolute 1.7 (*)  Abs Immature Granulocytes 0.09 (*)    All other components within normal limits  BASIC METABOLIC PANEL - Abnormal; Notable for the following components:   Sodium 133 (*)    CO2 18 (*)    Glucose, Bld 299 (*)    BUN 34 (*)    Creatinine, Ser 2.96 (*)    Calcium 8.4 (*)    GFR, Estimated 24 (*)    All other components within normal limits  TROPONIN I (HIGH SENSITIVITY) - Abnormal; Notable for the following components:   Troponin I (High Sensitivity) 20 (*)    All other components within normal limits  BRAIN NATRIURETIC PEPTIDE  PROCALCITONIN  TROPONIN I (HIGH SENSITIVITY)    EKG None  Radiology DG Chest 2 View  Result Date: 04/04/2022 CLINICAL DATA:  Chest pain and shortness of breath EXAM: CHEST - 2 VIEW COMPARISON:  12/08/2019 FINDINGS: Cardiac shadow is within normal limits. Increased vascular congestion is noted bilaterally. Diffuse somewhat rounded airspace opacity in the left mid lung is noted likely representing an acute infiltrate. No sizable effusion is noted. No bony abnormality is noted. IMPRESSION: Changes most consistent with lingular infiltrate. Superimposed congestive changes are noted as well. Electronically Signed   By: Inez Catalina M.D.   On: 04/04/2022 02:10    Procedures .Critical Care  Performed by: Merrily Pew, MD Authorized by: Merrily Pew, MD   Critical care provider statement:    Critical care time (minutes):  30   Critical care was necessary to treat or prevent imminent or life-threatening deterioration of  the following conditions:  Respiratory failure   Critical care was time spent personally by me on the following activities:  Development of treatment plan with patient or surrogate, discussions with consultants, evaluation of patient's response to treatment, examination of patient, ordering and review of laboratory studies, ordering and review of radiographic studies, ordering and performing treatments and interventions, pulse oximetry, re-evaluation of patient's condition and review of old charts     Medications Ordered in ED Medications  guaiFENesin (ROBITUSSIN) 100 MG/5ML liquid 5 mL (has no administration in time range)  aspirin EC tablet 81 mg (has no administration in time range)  amLODipine (NORVASC) tablet 5 mg (has no administration in time range)  atorvastatin (LIPITOR) tablet 40 mg (has no administration in time range)  hydrALAZINE (APRESOLINE) tablet 50 mg (50 mg Oral Given 04/04/22 0658)  metoprolol tartrate (LOPRESSOR) tablet 100 mg (has no administration in time range)  insulin aspart (novoLOG) injection 0-9 Units (has no administration in time range)  insulin glargine-yfgn (SEMGLEE) injection 50 Units (has no administration in time range)  insulin aspart (novoLOG) injection 0-5 Units (has no administration in time range)  insulin aspart (novoLOG) injection 6 Units (has no administration in time range)  furosemide (LASIX) injection 40 mg (has no administration in time range)  heparin injection 5,000 Units (5,000 Units Subcutaneous Given 04/04/22 0658)  sodium chloride flush (NS) 0.9 % injection 3 mL (has no administration in time range)  acetaminophen (TYLENOL) tablet 650 mg (has no administration in time range)    Or  acetaminophen (TYLENOL) suppository 650 mg (has no administration in time range)  senna-docusate (Senokot-S) tablet 1 tablet (has no administration in time range)  labetalol (NORMODYNE) injection 10 mg (has no administration in time range)  furosemide (LASIX)  injection 80 mg (80 mg Intravenous Given 04/04/22 0513)    ED Course/ Medical Decision Making/ A&P  Medical Decision Making Risk Prescription drug management. Decision regarding hospitalization.   Cxr viewed by myself and has large opacity on left but symptoms and signs more c/w puomonary edema. Is hypoxic. Pending ct scan to better diferrentiate. Will hold on lasix/fluids or further intervention until CT scan and BNP result.   Creatinine is close to 3. Likely related to the fluid overload but can't get a CT scan. Will d/w TRH for admission and further imaging.   Final Clinical Impression(s) / ED Diagnoses Final diagnoses:  None    Rx / DC Orders ED Discharge Orders     None         Nika Yazzie, Corene Cornea, MD 04/04/22 [phone]

## 2022-04-04 NOTE — ED Notes (Signed)
Spoke with nutrition services, new meal tray coming.

## 2022-04-04 NOTE — ED Triage Notes (Signed)
Pt was driving 4 hours today and while driving he was having SOB. Pt has history of blood clots. Pt has swelling and redness to right leg and it is oozing. Sbp 210. Also c/o chest pain radiating to back. 324 asa giving. 84% RA.  3lnc 95%.

## 2022-04-04 NOTE — Hospital Course (Signed)
56 y.o M h/o BMI 59, chronic diastolic CHF, hypertension, CKD stage IV, CAD status post CABG, insulin-dependent diabetes mellitus, admitted 8/13 for SOB, x 2 weeks + 30 pound weight gain in the past month, and has developed orthopnea over the past few days. He reports a history of DVT approximately 10 years ago that followed a surgery.  He is no longer anticoagulated. On EMS arrival, the patient was found to have systolic blood pressure of 210 and oxygen saturation of 84% on room air. 8/14 diuresing well, TTE Pending, on 3L [place] 8/15 improved breathing, -5 L UOP per recorded measurements

## 2022-04-04 NOTE — ED Provider Triage Note (Addendum)
Emergency Medicine Provider Triage Evaluation Note  Kristopher Simon , a 56 y.o. male  was evaluated in triage.  Pt complains of SOB while driving back from [place], [place].  Did have some right leg swelling while there.  Delayed his lasix over the weekend as he was driving.  Does have hx of PE's and reports this is how it started last time.  Not currently on anticoagulation.  O2 80's RA with EMS, now on 3L.  Generally not O2 dependent.  Review of Systems  Positive: SOB, leg swelling Negative: fever  Physical Exam  BP (!) 182/68   Pulse 92   Temp 99.2 F (37.3 C) (Oral)   Resp 18   Ht 6' (1.829 m)   Wt (!) 197 kg   SpO2 94%   BMI 58.90 kg/m   Gen:   Awake, no distress   Resp:  Normal effort, gets winded during conversation MSK:   Moves extremities without difficulty  Other:  Right lower leg with erythema but no warmth to touch, slight weeping along lateral calf  Medical Decision Making  Medically screening exam initiated at 1:23 AM.  Appropriate orders placed.  Kristopher Simon was informed that the remainder of the evaluation will be completed by another provider, this initial triage assessment does not replace that evaluation, and the importance of remaining in the ED until their evaluation is complete.  Shortness of breath, new hypoxia on 3L.  Recent 4-hour car ride each way to [place], [place].  Delayed his lasix due to travel, also reports hx of PE's with similar symptoms in the past.  Not currently on Anticoagulation.  EKG, labs, CXR, CTA.   Kristopher Pickett, PA-C 04/04/22 0131    Kristopher Pickett, PA-C 04/04/22 0132

## 2022-04-04 NOTE — ED Notes (Signed)
Transported to ECHO.

## 2022-04-04 NOTE — Inpatient Diabetes Management (Signed)
Inpatient Diabetes Program Recommendations  AACE/ADA: New Consensus Statement on Inpatient Glycemic Control (2015)  Target Ranges:  Prepandial:   less than 140 mg/dL      Peak postprandial:   less than 180 mg/dL (1-2 hours)      Critically ill patients:  140 - 180 mg/dL   Lab Results  Component Value Date   GLUCAP 343 (H) 04/04/2022   HGBA1C 10.1 (H) 09/13/2018    Review of Glycemic Control  Latest Reference Range & Units 04/04/22 08:29 04/04/22 12:43  Glucose-Capillary 70 - 99 mg/dL 362 (H) 343 (H)   CHF Diabetes history: DM 2 Outpatient Diabetes medications: Humulin U-500 150 units Daily, 140 units qpm, Mounjaro 7.5 mg weekly Current orders for Inpatient glycemic control:  Semglee 50 units bid Novolog 0-9 units tid + hs Novolog 6 units tid meal coverage  Inpatient Diabetes Program Recommendations:    -  Consider reordering patients home Humulin R U500 insulin, 100 units bid -   Increase Novolog to 0-20 units tid -  D/c Novolog 6 units tid meal coverage.   Thanks,  Tama Headings RN, MSN, BC-ADM Inpatient Diabetes Coordinator Team Pager [phone] (8a-5p)

## 2022-04-04 NOTE — H&P (Signed)
History and Physical    Kristopher Simon LZJ:[id number] DOB: 05/18/1966 DOA: 04/04/2022  PCP: Martinique, Sarah T, MD   Patient coming from: Home   Chief Complaint: SOB   HPI: Kristopher Simon is a pleasant 56 y.o. male with medical history significant for BMI 59, chronic diastolic CHF, hypertension, CKD stage IV, CAD status post CABG, insulin-dependent diabetes mellitus, now presenting with shortness of breath.  Patient reports approximately 2 weeks of insidiously worsening dyspnea and leg swelling leg swelling, has noted a 30 pound weight gain in the past month, and has developed orthopnea over the past few days.  He became acutely worse last night with dyspnea at rest, prompting him to call EMS.  He denies any fevers or chills.  He reports a history of DVT approximately 10 years ago that followed a surgery.  He is no longer anticoagulated.  On EMS arrival, the patient was found to have systolic blood pressure of 210 and oxygen saturation of 84% on room air.  ED Course: Upon arrival to the ED, patient is found to be afebrile, hypertensive with SBP as high as 203, and saturating mid 90s on 3 L/min of supplemental oxygen.  ED work-up notable for glucose 299, BUN 39, creatinine 2.96, and WBC 16,000.  There is increased vascular congestion and nonspecific opacity in the left lung on chest x-ray.  Patient was given 80 mg IV Lasix in the ED, he began to diuresis, and blood pressure improved.  Review of Systems:  All other systems reviewed and apart from HPI, are negative.  Past Medical History:  Diagnosis Date   Acute renal failure ([place]) 01/2012   CKD   Anxiety    Arthritis    hands   Cellulitis    hx left leg    Cellulitis of left lower extremity 07/06/2014   Claustrophobia    Concussion    Coronary artery disease    Dehiscence of amputation stump (HCC)    Depression    Diabetes mellitus 1995   Type II   DVT (deep venous thrombosis) ([place]) 2014   left leg   Fibromyalgia    Foot abscess,  left 09/12/2018   GERD (gastroesophageal reflux disease)    HLD (hyperlipidemia)    Hypertension 1998   Neuropathy    feet- below knee   Panic attacks    Peripheral vascular disease (HCC)    left leg, right neck   PTSD (post-traumatic stress disorder)    Sleep apnea    Stroke ([place])    x 3  memory loss - left hand- and left lower extremity-shakes at times- closes by it self- 06/2017- last one   Wears glasses    blind in left eye, readers    Past Surgical History:  Procedure Laterality Date   AMPUTATION Left 09/14/2018   Procedure: LEFT BELOW KNEE AMPUTATION;  Surgeon: Newt Minion, MD;  Location: [place];  Service: Orthopedics;  Laterality: Left;   CARDIAC CATHETERIZATION  2009   stent   COLONOSCOPY     HIP FRACTURE SURGERY Right 2014   metal in hip   KNEE ARTHROSCOPY Right    RETINAL CRYOABLATION Left    repair torn retina - blind in left eye   STUMP REVISION Left 10/19/2018   STUMP REVISION Left 10/19/2018   Procedure: REVISION LEFT BELOW KNEE AMPUTATION;  Surgeon: Newt Minion, MD;  Location: [place];  Service: Orthopedics;  Laterality: Left;   STUMP REVISION Left 11/30/2018   Procedure: REVISION LEFT BELOW  KNEE AMPUTATION;  Surgeon: Newt Minion, MD;  Location: [facility];  Service: Orthopedics;  Laterality: Left;  REVISION LEFT BELOW KNEE AMPUTATION   STUMP REVISION Left 12/07/2018   Procedure: REVISION LEFT BELOW KNEE AMPUTATION;  Surgeon: Newt Minion, MD;  Location: [facility];  Service: Orthopedics;  Laterality: Left;   TONSILLECTOMY     WISDOM TOOTH EXTRACTION      Social History:   reports that he has never smoked. He has never used smokeless tobacco. He reports current alcohol use of about 1.0 standard drink of alcohol per week. He reports that he does not use drugs.  Allergies  Allergen Reactions   Bactrim [Sulfamethoxazole-Trimethoprim] Anaphylaxis and Swelling     (BACTRIM) tongue swells   Lyrica [Pregabalin] Swelling    SWELLING REACTION UNSPECIFIED     Liraglutide Nausea Only   Magnesium Swelling    Family History  Problem Relation Age of Onset   Hypertension Mother    Colon polyps Mother    Dementia Father    Alzheimer's disease Father    Heart disease Father    Thyroid disease Sister    Obesity Sister    Colon polyps Brother    Diabetes Maternal Aunt        several aunts and uncles   Diabetes Maternal Uncle    Heart disease Maternal Uncle    Heart disease Maternal Uncle      Prior to Admission medications   Medication Sig Start Date End Date Taking? Authorizing Provider  acetaminophen (TYLENOL) 325 MG tablet Take 1-2 tablets (325-650 mg total) by mouth every 4 (four) hours as needed for mild pain. 09/26/18   Love, Ivan Anchors, PA-C  amLODipine (NORVASC) 5 MG tablet Take 1 tablet (5 mg total) by mouth daily. 09/27/18   Love, Ivan Anchors, PA-C  aspirin EC 81 MG tablet Take 1 tablet (81 mg total) by mouth daily. 09/26/18   Love, Ivan Anchors, PA-C  atorvastatin (LIPITOR) 40 MG tablet Take 40 mg by mouth daily. 10/14/21   [provider]  BAQSIMI ONE PACK 3 MG/DOSE POWD SMARTSIG:1 Spray(s) Both Nares PRN 11/03/21   [provider]  citalopram (CELEXA) 40 MG tablet Take 1 tablet (40 mg total) by mouth daily. 04/20/21   [name], MD  Continuous Blood Gluc Receiver (DEXCOM G6 RECEIVER) DEVI Use as directed for continuous glucose monitoring. 06/23/21   [provider]  Continuous Blood Gluc Sensor (DEXCOM G6 SENSOR) MISC Inject 1 sensor to the skin every 10 days for continuous glucose monitoring. 07/28/20   [provider]  Continuous Blood Gluc Transmit (DEXCOM G6 TRANSMITTER) MISC Use as directed for continuous glucose monitoring. Reuse transmitter for 90 days then discard and replace. 07/28/20   [provider]  cyclobenzaprine (FLEXERIL) 5 MG tablet Take 1 tablet (5 mg total) by mouth 3 (three) times daily as needed for muscle spasms. Patient taking differently: Take 5 mg by mouth 3 (three) times daily.  09/26/18   Love, Ivan Anchors, PA-C  esomeprazole (NEXIUM) 40 MG capsule Take 1 capsule (40 mg total) by mouth every morning. 09/26/18   Love, Ivan Anchors, PA-C  ferrous gluconate (FERGON) 324 MG tablet Take 1 tablet (324 mg total) by mouth 3 (three) times daily with meals. 12/11/18   Rayburn, Neta Mends, PA-C  furosemide (LASIX) 20 MG tablet TAKE 1 TABLET BY MOUTH ONCE DAILY FOR FLUID AND FOR BLOOD PRESSURE 03/03/20   [provider]  gabapentin (NEURONTIN) 300 MG capsule Take 1 capsule (300  mg total) by mouth 3 (three) times daily. 09/26/18   Love, Ivan Anchors, PA-C  gemfibrozil (LOPID) 600 MG tablet Take 1 tablet (600 mg total) by mouth 2 (two) times daily before a meal. 09/26/18   Love, Ivan Anchors, PA-C  hydrALAZINE (APRESOLINE) 50 MG tablet Take 50 mg by mouth 3 (three) times daily. 01/28/22   [provider]  hydrocerin (EUCERIN) CREA Apply 1 application topically 2 (two) times daily. To dry skin on right foot/leg 09/26/18   Love, Pamela S, PA-C  Insulin Pen Needle (BD PEN NEEDLE NANO U/F) 32G X 4 MM MISC Use to inject insulin 3 times daily 07/23/19   [provider]  insulin regular human CONCENTRATED (HUMULIN R U-500 KWIKPEN) 500 UNIT/ML kwikpen Inject 135 units in the AM with breakfast, inject 90 units in the PM with dinner, approximately 12 hours apart 04/23/19   [provider]  Lanolin Alcohol WAX Apply topically. 09/26/18   [provider]  metoprolol tartrate 75 MG TABS Take 75 mg by mouth 2 (two) times daily. 09/26/18   Love, Ivan Anchors, PA-C  MOUNJARO 7.5 MG/0.5ML Pen Inject 7.5 mg into the skin once a week. 01/31/22   [provider]  nitroGLYCERIN (NITROSTAT) 0.4 MG SL tablet Place under the tongue. Patient not taking: Reported on 02/02/2022 03/26/20   [provider]  omeprazole (PRILOSEC) 40 MG capsule Take 1 capsule (40 mg total) by mouth daily. 02/02/22   Sharyn Creamer, MD  traZODone (DESYREL) 50 MG tablet Take 0.5-1 tablets (25-50 mg total) by  mouth at bedtime as needed for sleep. Patient taking differently: Take 50 mg by mouth at bedtime as needed for sleep. 09/26/18   Love, Ivan Anchors, PA-C  triamcinolone cream (KENALOG) 0.1 % Apply 1 application topically 2 (two) times daily. Patient taking differently: Apply 1 application  topically as needed. 03/03/21   Landis Martins, DPM    Physical Exam: Vitals:   04/04/22 0345 04/04/22 0430 04/04/22 0445 04/04/22 0500  BP: (!) 199/76 (!) 161/59 (!) 192/71 (!) 156/109  Pulse: 83 85 88 86  Resp:  16 18   Temp:      TempSrc:      SpO2: 97% 97% 96% 97%  Weight:      Height:        Constitutional: NAD, calm  Eyes: PERTLA, lids and conjunctivae normal ENMT: Mucous membranes are moist. Posterior pharynx clear of any exudate or lesions.   Neck: supple, no masses  Respiratory: Dyspneic with speech, no wheezing. Symmetric chest wall expansion.  Cardiovascular: S1 & S2 heard, regular rate and rhythm. LE pitting edema with serous weeping.  Abdomen: No distension, no tenderness, soft. Bowel sounds active.  Musculoskeletal: no clubbing / cyanosis. S/p left BKA.   Skin: no significant rashes, lesions, ulcers. Warm, dry, well-perfused. Neurologic: CN 2-12 grossly intact. Moving all extremities. Alert and oriented.  Psychiatric: Pleasant. Cooperative.    Labs and Imaging on Admission: I have personally reviewed following labs and imaging studies  CBC: Recent Labs  Lab 04/04/22 0249  WBC 16.0*  NEUTROABS 13.5*  HGB 10.3*  HCT 32.6*  MCV 90.8  PLT 081   Basic Metabolic Panel: Recent Labs  Lab 04/04/22 0249  NA 133*  K 4.0  CL 104  CO2 18*  GLUCOSE 299*  BUN 34*  CREATININE 2.96*  CALCIUM 8.4*   GFR: Estimated Creatinine Clearance: 49.4 mL/min (A) (by C-G formula based on SCr of 2.96 mg/dL (H)). Liver Function Tests: No results for  input(s): "AST", "ALT", "ALKPHOS", "BILITOT", "PROT", "ALBUMIN" in the last 168 hours. No results for input(s): "LIPASE", "AMYLASE" in the last  168 hours. No results for input(s): "AMMONIA" in the last 168 hours. Coagulation Profile: No results for input(s): "INR", "PROTIME" in the last 168 hours. Cardiac Enzymes: No results for input(s): "CKTOTAL", "CKMB", "CKMBINDEX", "TROPONINI" in the last 168 hours. BNP (last 3 results) No results for input(s): "PROBNP" in the last 8760 hours. HbA1C: No results for input(s): "HGBA1C" in the last 72 hours. CBG: No results for input(s): "GLUCAP" in the last 168 hours. Lipid Profile: No results for input(s): "CHOL", "HDL", "LDLCALC", "TRIG", "CHOLHDL", "LDLDIRECT" in the last 72 hours. Thyroid Function Tests: No results for input(s): "TSH", "T4TOTAL", "FREET4", "T3FREE", "THYROIDAB" in the last 72 hours. Anemia Panel: No results for input(s): "VITAMINB12", "FOLATE", "FERRITIN", "TIBC", "IRON", "RETICCTPCT" in the last 72 hours. Urine analysis: No results found for: "COLORURINE", "APPEARANCEUR", "LABSPEC", "PHURINE", "GLUCOSEU", "HGBUR", "BILIRUBINUR", "KETONESUR", "PROTEINUR", "UROBILINOGEN", "NITRITE", "LEUKOCYTESUR" Sepsis Labs: '@LABRCNTIP'$ (procalcitonin:4,lacticidven:4) )No results found for this or any previous visit (from the past 240 hour(s)).   Radiological Exams on Admission: DG Chest 2 View  Result Date: 04/04/2022 CLINICAL DATA:  Chest pain and shortness of breath EXAM: CHEST - 2 VIEW COMPARISON:  12/08/2019 FINDINGS: Cardiac shadow is within normal limits. Increased vascular congestion is noted bilaterally. Diffuse somewhat rounded airspace opacity in the left mid lung is noted likely representing an acute infiltrate. No sizable effusion is noted. No bony abnormality is noted. IMPRESSION: Changes most consistent with lingular infiltrate. Superimposed congestive changes are noted as well. Electronically Signed   By: Inez Catalina M.D.   On: 04/04/2022 02:10    EKG: Independently reviewed. Sinus rhythm.   Assessment/Plan   1. Acute on chronic diastolic CHF; acute hypoxic respiratory  failure  - Presents with SOB at rest after 2 wks of progressive DOE, leg swelling, wt gain, and orthopnea   - BNP is normal but there are CHF findings on CXR and history/exam most suggestive of CHF  - EF was preserved on TTE in January 2020  - Given 80 mg IV Lasix in ED  - Continue diuresis with IV Lasix, monitor wt and I/Os, update echo     2. Hypertensive urgency  - SBP as high as 203 in ED, improved with IV Lasix  - Continue diuresis, continue hydralazine, Toprol, and Norvasc     3. CAD  - No anginal complaints  - Continue ASA, Lipitor, Toprol    4. IDDM  - A1c was 8.0% in May 2023  - Continue CBG checks and insulin    5. CKD IV  - SCr is 2.96 in ED, up from 2.74 in May 2023  - Renally-dose medications, follow closely while diuresing   6. Hx of CVA  - Continue ASA 81 and Lipitor    7. Leukocytosis  - Appears chronic, he is afebrile  - Radiology raises question of pneumonia on CXR but hx and exam more suggestive of acute CHF  - Procalcitonin pending, watch off of abx for now    DVT prophylaxis: sq heparin  Code Status: Full  Level of Care:  Level of care: Progressive Family Communication: none present  Disposition Plan:  Patient is from: home  Anticipated d/c is to: TBD Anticipated d/c date is: 8/17 or 04/08/22  Patient currently: Pending improvement in respiratory status, BP control, stable labs  Consults called: None  Admission status: Inpatient     Vianne Bulls, MD [facility]  04/04/2022,  6:07 AM

## 2022-04-04 NOTE — Progress Notes (Signed)
Right LE venous duplex study completed. Please see CV Proc for preliminary results.  Karcyn Menn BS, RVT 04/04/2022 9:11 AM

## 2022-04-04 NOTE — ED Notes (Signed)
Patient provided with urinal for elimination

## 2022-04-05 ENCOUNTER — Inpatient Hospital Stay (HOSPITAL_COMMUNITY): Payer: Medicaid Other

## 2022-04-05 DIAGNOSIS — J9601 Acute respiratory failure with hypoxia: Secondary | ICD-10-CM | POA: Diagnosis not present

## 2022-04-05 LAB — BASIC METABOLIC PANEL
Anion gap: 9 (ref 5–15)
BUN: 40 mg/dL — ABNORMAL HIGH (ref 6–20)
CO2: 23 mmol/L (ref 22–32)
Calcium: 8.7 mg/dL — ABNORMAL LOW (ref 8.9–10.3)
Chloride: 104 mmol/L (ref 98–111)
Creatinine, Ser: 3.06 mg/dL — ABNORMAL HIGH (ref 0.61–1.24)
GFR, Estimated: 23 mL/min — ABNORMAL LOW (ref >60)
Glucose, Bld: 259 mg/dL — ABNORMAL HIGH (ref 70–99)
Potassium: 3.6 mmol/L (ref 3.5–5.1)
Sodium: 136 mmol/L (ref 135–145)

## 2022-04-05 LAB — CBC
HCT: 31.1 % — ABNORMAL LOW (ref 39.0–52.0)
Hemoglobin: 10.3 g/dL — ABNORMAL LOW (ref 13.0–17.0)
MCH: 28.8 pg (ref 26.0–34.0)
MCHC: 33.1 g/dL (ref 30.0–36.0)
MCV: 86.9 fL (ref 80.0–100.0)
Platelets: 350 10*3/uL (ref 150–400)
RBC: 3.58 MIL/uL — ABNORMAL LOW (ref 4.22–5.81)
RDW: 14 % (ref 11.5–15.5)
WBC: 12.1 10*3/uL — ABNORMAL HIGH (ref 4.0–10.5)
nRBC: 0 % (ref 0.0–0.2)

## 2022-04-05 LAB — PROCALCITONIN: Procalcitonin: <0.1 ng/mL

## 2022-04-05 LAB — GLUCOSE, CAPILLARY
Glucose-Capillary: 164 mg/dL — ABNORMAL HIGH (ref 70–99)
Glucose-Capillary: 167 mg/dL — ABNORMAL HIGH (ref 70–99)
Glucose-Capillary: 201 mg/dL — ABNORMAL HIGH (ref 70–99)
Glucose-Capillary: 225 mg/dL — ABNORMAL HIGH (ref 70–99)
Glucose-Capillary: 285 mg/dL — ABNORMAL HIGH (ref 70–99)

## 2022-04-05 LAB — HIV ANTIBODY (ROUTINE TESTING W REFLEX): HIV Screen 4th Generation wRfx: NONREACTIVE

## 2022-04-05 LAB — MAGNESIUM: Magnesium: 2 mg/dL (ref 1.7–2.4)

## 2022-04-05 MED ORDER — INSULIN GLARGINE-YFGN 100 UNIT/ML ~~LOC~~ SOLN
60.0000 [IU] | Freq: Two times a day (BID) | SUBCUTANEOUS | Status: DC
  Administered 2022-04-05 – 2022-04-07 (×5): 60 [IU] via SUBCUTANEOUS
  Filled 2022-04-05 (×7): qty 0.6

## 2022-04-05 NOTE — Consult Note (Signed)
[facility] Nurse Consult Note: Reason for Consult:wound on RLE, pretibial area.  Wound type: partial thickness Pressure Injury POA: N/A Measurement:Per Nursing Flow Sheet no 04/04/22 Wound YQI:HKVQ Drainage (amount, consistency, odor) scant, yellow Periwound:intact, edematous Dressing procedure/placement/frequency:I have provided Nursing with conservative guidance for the care of this lesion using a soap and water cleanse, rinse and paty dry followed by placement of an antimicrobial nonadherent gauze dressing (xeroform) topped with dry gauze and secured with a silicone bordered foram dressing. This is to be performed daily. Right heel is to be floated off of the bed surface while in bed.  If desired, further evaluation and input to the POC can be considered by Dr. Sharol Given (Orthopedics) as patient is known to him (last visit in the office was on 03/24/22) or Drs. Blenda Mounts or Cannon Kettle (Podiatric Medicine) last seen in their office in May 2023 (he missed an appointment in July).  [facility] nursing team will not follow, but will remain available to this patient, the nursing and medical teams.  Please re-consult if needed.  Thank you for inviting [place] to participate in this patient's Plan of Care.  Maudie Flakes, MSN, RN, CNS, [place], Serita Grammes, [employer], [employer] phone:  [phone]

## 2022-04-05 NOTE — Progress Notes (Signed)
Heart Failure Navigator Progress Note  Assessed for Heart & Vascular TOC clinic readiness.  Patient does not meet criteria due to Creat 3.06, EF 60-65%.   Navigator available for reassessment of patient.   Earnestine Leys, BSN, Clinical cytogeneticist Only

## 2022-04-05 NOTE — Progress Notes (Signed)
Progress Note   Patient: Kristopher Simon CWC:[id number] DOB: Sep 10, 1965 DOA: 04/04/2022     1 DOS: the patient was seen and examined on 04/05/2022   Brief hospital course: 56 y.o M h/o BMI 59, chronic diastolic CHF, hypertension, CKD stage IV, CAD status post CABG, insulin-dependent diabetes mellitus, admitted 8/13 for SOB, x 2 weeks + 30 pound weight gain in the past month, and has developed orthopnea over the past few days. He reports a history of DVT approximately 10 years ago that followed a surgery.  He is no longer anticoagulated. On EMS arrival, the patient was found to have systolic blood pressure of 210 and oxygen saturation of 84% on room air. 8/14 diuresing well, TTE Pending, on 3L [place] 8/15 improved breathing, -5 L UOP per recorded measurements  Assessment and Plan:  1. Acute on chronic diastolic CHF; acute hypoxic respiratory failure  - Presents with SOB at rest after 2 wks of progressive DOE, leg swelling, wt gain, and orthopnea   - BNP is normal but CHF findings on CXR and history/exam most suggestive of CHF  - EF was preserved on TTE in January 2020, repeat TTE pending - s/p 80 mg IV Lasix in ED, continue diuresis as tolerated monitor renal function daily, electrolytes - Continue diuresis with IV Lasix, monitor wt and I/Os, update echo     Reassess volume status in AM, renal function, slight uptick in creatinine but otherwise good urine output and no electrolyte abnormalities.  2. Hypertensive urgency  - SBP as high as 203 in ED, improved with IV Lasix  - Continue diuresis, continue hydralazine, Toprol, and Norvasc      3. CAD  - No anginal complaints  - Continue ASA, Lipitor, Toprol     4. IDDM  - A1c was 8.0% in May 2023  - Continue CBG checks and insulin     5. CKD IV  - SCr is 2.96 in ED, up from 2.74 in May 2023  - Renally-dose medications, follow closely while diuresing    6. Hx of CVA  - Continue ASA 81 and Lipitor     7. Leukocytosis  - Appears chronic, he  is afebrile  - Radiology raises question of pneumonia on CXR but hx and exam more suggestive of acute CHF  - Procalcitonin pending, watch off of abx for now      DVT prophylaxis: sq heparin  Code Status: Full  Level of Care:  Level of care: Progressive Family Communication: none present  Disposition Plan:  Patient is from: home  Anticipated d/c is to: TBD Anticipated d/c date is: 8/17 or 04/08/22  Patient currently: Pending improvement in respiratory status, BP control, stable labs  Consults called: None  Admission status: Inpatient       Subjective:  Improved dyspnea, -5L UOP per recorded measurements in EMR Denies CP SOB at this time. Feels weak Improved lower extremity swelling.   Physical Exam: Vitals:   04/05/22 0224 04/05/22 0400 04/05/22 0800 04/05/22 1200  BP: (!) 155/63 (!) 150/70 (!) 161/66 (!) 151/71  Pulse: 69 69 68 66  Resp: '17 15 19 13  '$ Temp:  98.2 F (36.8 C) 97.9 F (36.6 C)   TempSrc:  Oral Oral   SpO2: 91% 93% 95% 94%  Weight:      Height:       Constitutional: NAD, calm  Eyes: PERTLA, lids and conjunctivae normal ENMT: Mucous membranes are moist. Posterior pharynx clear of any exudate or lesions.   Neck: supple,  no masses  Respiratory: Dyspneic with speech, no wheezing. Symmetric chest wall expansion.  Cardiovascular: S1 & S2 heard, regular rate and rhythm. LE pitting edema with serous weeping.  Abdomen: No distension, no tenderness, soft. Bowel sounds active.  Musculoskeletal: no clubbing / cyanosis. S/p left BKA.   Skin: no significant rashes, lesions, ulcers. Warm, dry, well-perfused. Neurologic: CN 2-12 grossly intact. Moving all extremities. Alert and oriented.  Psychiatric: Pleasant. Cooperative.    Data Reviewed:  There are no new results to review at this time.  Family Communication: none available  Disposition: Status is: Inpatient Remains inpatient appropriate because: CHF excarbation, hypoxia  Planned Discharge Destination:  Home    Time spent: 25 minutes  Author: Vanna Scotland, MD 04/05/2022 3:38 PM  For on call review [email or web address].

## 2022-04-05 NOTE — Inpatient Diabetes Management (Signed)
Inpatient Diabetes Program Recommendations  AACE/ADA: New Consensus Statement on Inpatient Glycemic Control (2015)  Target Ranges:  Prepandial:   less than 140 mg/dL      Peak postprandial:   less than 180 mg/dL (1-2 hours)      Critically ill patients:  140 - 180 mg/dL   Lab Results  Component Value Date   GLUCAP 225 (H) 04/05/2022   HGBA1C 10.1 (H) 09/13/2018    Review of Glycemic Control  Latest Reference Range & Units 04/04/22 08:29 04/04/22 12:43  Glucose-Capillary 70 - 99 mg/dL 362 (H) 343 (H)    Latest Reference Range & Units 04/04/22 08:29 04/04/22 12:43 04/04/22 16:37 04/04/22 21:43 04/04/22 23:52 04/05/22 08:01  Glucose-Capillary 70 - 99 mg/dL 362 (H) 343 (H) 256 (H) 364 (H) 285 (H) 225 (H)   CHF Diabetes history: DM 2 Outpatient Diabetes medications: Humulin U-500 150 units Daily, 140 units qpm, Mounjaro 7.5 mg weekly Current orders for Inpatient glycemic control:  Semglee 50 units bid Novolog 0-9 units tid + hs Novolog 6 units tid meal coverage  Inpatient Diabetes Program Recommendations:    -  Increase Semglee 60 units bid -  Increase Novolog meal coverage to 10 units tid meal coverage.   Thanks,  Tama Headings RN, MSN, BC-ADM Inpatient Diabetes Coordinator Team Pager [phone] (8a-5p)
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
## 2022-04-06 ENCOUNTER — Encounter (HOSPITAL_COMMUNITY): Payer: Medicaid Other

## 2022-04-06 DIAGNOSIS — J9601 Acute respiratory failure with hypoxia: Secondary | ICD-10-CM | POA: Diagnosis not present

## 2022-04-06 LAB — CBC
HCT: 31 % — ABNORMAL LOW (ref 39.0–52.0)
Hemoglobin: 9.9 g/dL — ABNORMAL LOW (ref 13.0–17.0)
MCH: 28.5 pg (ref 26.0–34.0)
MCHC: 31.9 g/dL (ref 30.0–36.0)
MCV: 89.3 fL (ref 80.0–100.0)
Platelets: 370 10*3/uL (ref 150–400)
RBC: 3.47 MIL/uL — ABNORMAL LOW (ref 4.22–5.81)
RDW: 14 % (ref 11.5–15.5)
WBC: 10.6 10*3/uL — ABNORMAL HIGH (ref 4.0–10.5)
nRBC: 0 % (ref 0.0–0.2)

## 2022-04-06 LAB — BASIC METABOLIC PANEL
Anion gap: 10 (ref 5–15)
BUN: 44 mg/dL — ABNORMAL HIGH (ref 6–20)
CO2: 22 mmol/L (ref 22–32)
Calcium: 8.5 mg/dL — ABNORMAL LOW (ref 8.9–10.3)
Chloride: 104 mmol/L (ref 98–111)
Creatinine, Ser: 3.23 mg/dL — ABNORMAL HIGH (ref 0.61–1.24)
GFR, Estimated: 22 mL/min — ABNORMAL LOW (ref >60)
Glucose, Bld: 130 mg/dL — ABNORMAL HIGH (ref 70–99)
Potassium: 3.5 mmol/L (ref 3.5–5.1)
Sodium: 136 mmol/L (ref 135–145)

## 2022-04-06 LAB — GLUCOSE, CAPILLARY
Glucose-Capillary: 132 mg/dL — ABNORMAL HIGH (ref 70–99)
Glucose-Capillary: 139 mg/dL — ABNORMAL HIGH (ref 70–99)
Glucose-Capillary: 199 mg/dL — ABNORMAL HIGH (ref 70–99)
Glucose-Capillary: 98 mg/dL (ref 70–99)

## 2022-04-06 LAB — PROCALCITONIN: Procalcitonin: <0.1 ng/mL

## 2022-04-06 MED ORDER — GABAPENTIN 300 MG PO CAPS
300.0000 mg | ORAL_CAPSULE | Freq: Two times a day (BID) | ORAL | Status: DC
  Administered 2022-04-06 – 2022-04-08 (×4): 300 mg via ORAL
  Filled 2022-04-06 (×4): qty 1

## 2022-04-06 MED ORDER — METOPROLOL TARTRATE 50 MG PO TABS
75.0000 mg | ORAL_TABLET | Freq: Two times a day (BID) | ORAL | Status: DC
  Administered 2022-04-06 – 2022-04-08 (×4): 75 mg via ORAL
  Filled 2022-04-06 (×4): qty 1

## 2022-04-06 MED ORDER — FUROSEMIDE 40 MG PO TABS
40.0000 mg | ORAL_TABLET | Freq: Every day | ORAL | Status: DC
  Administered 2022-04-07 – 2022-04-08 (×2): 40 mg via ORAL
  Filled 2022-04-06 (×2): qty 1

## 2022-04-06 NOTE — Progress Notes (Signed)
PROGRESS NOTE    Kristopher Simon  URK:[id number] DOB: 1965/10/10 DOA: 04/04/2022 PCP: Martinique, Sarah T, MD  56 y.o M h/o BMI 59, chronic diastolic CHF, hypertension, CKD stage IV, CAD status post CABG, insulin-dependent diabetes mellitus, admitted 8/13 for SOB, x 2 weeks + 30 pound weight gain in the past month, and has developed orthopnea over the past few days. He reports a history of DVT approximately 10 years ago that followed a surgery.  He is no longer anticoagulated  Subjective: -Feels better, breathing has improved considerably  Assessment and Plan:   Acute on chronic diastolic CHF;  Acute hypoxic respiratory failure  - Presents with SOB at rest after 2 wks of progressive DOE, leg swelling, wt gain, and orthopnea   -Echo with preserved EF, grade 1 diastolic dysfunction -Diuresed with IV Lasix he is 7.7 L negative, complicated by CKD 4, mild bump in creatinine -hold further doses of IV Lasix today, restart oral diuretics tomorrow -GDMT limited by CKD 4 -Ambulate, PT eval    Hypertensive urgency  - SBP as high as 203 in ED, improved with IV Lasix  - Continue diuresis, continue hydralazine, Toprol,     CAD  - No anginal complaints  - Continue ASA, Lipitor, Toprol      IDDM  - A1c was 8.0% in May 2023  -Stable  Peripheral vascular disease -Left BKA   5. CKD IV  - SCr is 2.96 in ED, up from 2.74 in May 2023  -Mild bump in creatinine, he is followed by nephrology at [facility] IV Lasix today   6. Hx of CVA  - Continue ASA 81 and Lipitor     7. Leukocytosis  - Appears chronic, he is afebrile  -Improving, follow-up with hematology     DVT prophylaxis: sq heparin  Code Status: Full  Family Communication: Discussed with patient detail, no family at bedside Disposition Plan:   Consultants:    Procedures:   Antimicrobials:    Objective: Vitals:   04/06/22 0353 04/06/22 0652 04/06/22 0746 04/06/22 0859  BP: 131/61  129/83   Pulse: 62  65   Resp: 14   16   Temp: (!) 97.5 F (36.4 C)  97.8 F (36.6 C)   TempSrc: Oral  Oral   SpO2: 95%   96%  Weight:  (!) 192.4 kg    Height:        Intake/Output Summary (Last 24 hours) at 04/06/2022 1122 Last data filed at 04/06/2022 8315 Gross per 24 hour  Intake 723 ml  Output 2075 ml  Net -1352 ml   Filed Weights   04/04/22 0126 04/06/22 0652  Weight: (!) 197 kg (!) 192.4 kg    Examination:  General exam: Obese male sitting up in bed, appears calm and comfortable, AAOx3 Respiratory system: Clear to auscultation Cardiovascular system: S1 & S2 heard, RRR.  Abd: nondistended, soft and nontender.Normal bowel sounds heard. Central nervous system: Alert and oriented. No focal neurological deficits. Extremities: Trace edema, left BKA Skin: No rashes Psychiatry:  Mood & affect appropriate.     Data Reviewed:   CBC: Recent Labs  Lab 04/04/22 0249 04/05/22 0458 04/06/22 0506  WBC 16.0* 12.1* 10.6*  NEUTROABS 13.5*  --   --   HGB 10.3* 10.3* 9.9*  HCT 32.6* 31.1* 31.0*  MCV 90.8 86.9 89.3  PLT 343 350 176   Basic Metabolic Panel: Recent Labs  Lab 04/04/22 0249 04/04/22 1307 04/05/22 0458 04/06/22 0506  NA 133* 137 136  136  K 4.0 4.0 3.6 3.5  CL 104 108 104 104  CO2 18* '22 23 22  '$ GLUCOSE 299* 328* 259* 130*  BUN 34* 37* 40* 44*  CREATININE 2.96* 2.88* 3.06* 3.23*  CALCIUM 8.4* 8.3* 8.7* 8.5*  MG  --   --  2.0  --   PHOS  --  3.5  --   --    GFR: Estimated Creatinine Clearance: 44.6 mL/min (A) (by C-G formula based on SCr of 3.23 mg/dL (H)). Liver Function Tests: Recent Labs  Lab 04/04/22 1307  ALBUMIN 2.6*   No results for input(s): "LIPASE", "AMYLASE" in the last 168 hours. No results for input(s): "AMMONIA" in the last 168 hours. Coagulation Profile: No results for input(s): "INR", "PROTIME" in the last 168 hours. Cardiac Enzymes: No results for input(s): "CKTOTAL", "CKMB", "CKMBINDEX", "TROPONINI" in the last 168 hours. BNP (last 3 results) No results for  input(s): "PROBNP" in the last 8760 hours. HbA1C: No results for input(s): "HGBA1C" in the last 72 hours. CBG: Recent Labs  Lab 04/05/22 0801 04/05/22 1126 04/05/22 1640 04/05/22 2137 04/06/22 0821  GLUCAP 225* 201* 164* 167* 132*   Lipid Profile: No results for input(s): "CHOL", "HDL", "LDLCALC", "TRIG", "CHOLHDL", "LDLDIRECT" in the last 72 hours. Thyroid Function Tests: No results for input(s): "TSH", "T4TOTAL", "FREET4", "T3FREE", "THYROIDAB" in the last 72 hours. Anemia Panel: No results for input(s): "VITAMINB12", "FOLATE", "FERRITIN", "TIBC", "IRON", "RETICCTPCT" in the last 72 hours. Urine analysis: No results found for: "COLORURINE", "APPEARANCEUR", "LABSPEC", "PHURINE", "GLUCOSEU", "HGBUR", "BILIRUBINUR", "KETONESUR", "PROTEINUR", "UROBILINOGEN", "NITRITE", "LEUKOCYTESUR" Sepsis Labs: '@LABRCNTIP'$ (procalcitonin:4,lacticidven:4)  )No results found for this or any previous visit (from the past 240 hour(s)).   Radiology Studies: US RENAL  Result Date: 04/05/2022 CLINICAL DATA:  Acute kidney injury EXAM: RENAL / URINARY TRACT ULTRASOUND COMPLETE COMPARISON:  None Available. FINDINGS: Right Kidney: Renal measurements: 11.7 x 5.5 x 5.5 cm = volume: 182 mL. Echogenicity within normal limits. No mass or hydronephrosis visualized. Left Kidney: Renal measurements: 12.8 x 6.0 x 6.0 cm = volume: 239 mL. Echogenicity within normal limits. No mass or hydronephrosis visualized. Bladder: Appears normal for degree of bladder distention. Other: None. IMPRESSION: Normal renal sonogram. Electronically Signed   By: Kerby Moors M.D.   On: 04/05/2022 08:49   ECHOCARDIOGRAM COMPLETE  Result Date: 04/04/2022    ECHOCARDIOGRAM REPORT   Patient Name:   Kristopher Simon Date of Exam: 04/04/2022 Medical Rec #:  [id number]       Height:       72.0 in Accession #:    [id number]      Weight:       434.3 lb Date of Birth:  October 17, 1965       BSA:          2.962 m Patient Age:    25 years        BP:            156/63 mmHg Patient Gender: M               HR:           73 bpm. Exam Location:  Inpatient Procedure: 2D Echo, Intracardiac Opacification Agent, Cardiac Doppler and Color            Doppler Indications:    CHF  History:        Patient has no prior history of Echocardiogram examinations.                 CAD;  Risk Factors:Hypertension and Diabetes.  Sonographer:    Danne Baxter RDCS, FE, PE Referring Phys: [id number] TIMOTHY S OPYD  Sonographer Comments: Technically difficult study due to poor echo windows. IMPRESSIONS  1. Left ventricular ejection fraction, by estimation, is 60 to 65%. The left ventricle has normal function. The left ventricle has no regional wall motion abnormalities. There is mild left ventricular hypertrophy. Left ventricular diastolic parameters are consistent with Grade I diastolic dysfunction (impaired relaxation).  2. Right ventricular systolic function was not well visualized. The right ventricular size is not well visualized. Tricuspid regurgitation signal is inadequate for assessing PA pressure.  3. The mitral valve is abnormal. Trivial mitral valve regurgitation.  4. The aortic valve was not well visualized. Aortic valve regurgitation is not visualized.  5. The inferior vena cava is normal in size with greater than 50% respiratory variability, suggesting right atrial pressure of 3 mmHg. Comparison(s): No prior Echocardiogram. FINDINGS  Left Ventricle: Left ventricular ejection fraction, by estimation, is 60 to 65%. The left ventricle has normal function. The left ventricle has no regional wall motion abnormalities. Definity contrast agent was given IV to delineate the left ventricular  endocardial borders. The left ventricular internal cavity size was normal in size. There is mild left ventricular hypertrophy. Left ventricular diastolic parameters are consistent with Grade I diastolic dysfunction (impaired relaxation). Indeterminate filling pressures. Right Ventricle: The right ventricular  size is not well visualized. Right vetricular wall thickness was not well visualized. Right ventricular systolic function was not well visualized. Tricuspid regurgitation signal is inadequate for assessing PA pressure. Left Atrium: Left atrial size was normal in size. Right Atrium: Right atrial size was normal in size. Pericardium: There is no evidence of pericardial effusion. Mitral Valve: The mitral valve is abnormal. Mild mitral annular calcification. Trivial mitral valve regurgitation. Tricuspid Valve: The tricuspid valve is not well visualized. Tricuspid valve regurgitation is not demonstrated. Aortic Valve: The aortic valve was not well visualized. Aortic valve regurgitation is not visualized. Pulmonic Valve: The pulmonic valve was not well visualized. Pulmonic valve regurgitation is not visualized. Aorta: The aortic root and ascending aorta are structurally normal, with no evidence of dilitation. Venous: The inferior vena cava is normal in size with greater than 50% respiratory variability, suggesting right atrial pressure of 3 mmHg. IAS/Shunts: No atrial level shunt detected by color flow Doppler.  LEFT VENTRICLE PLAX 2D LVIDd:         5.50 cm   Diastology LVIDs:         4.00 cm   LV e' medial:    6.06 cm/s LV PW:         1.40 cm   LV E/e' medial:  17.3 LV IVS:        1.20 cm   LV e' lateral:   8.55 cm/s LVOT diam:     2.50 cm   LV E/e' lateral: 12.3 LV SV:         129 LV SV Index:   44 LVOT Area:     4.91 cm  LEFT ATRIUM             Index LA diam:        4.70 cm 1.59 cm/m LA Vol (A2C):   93.7 ml 31.63 ml/m LA Vol (A4C):   84.1 ml 28.39 ml/m LA Biplane Vol: 94.8 ml 32.00 ml/m  AORTIC VALVE LVOT Vmax:   109.00 cm/s LVOT Vmean:  84.200 cm/s LVOT VTI:    0.263 m  AORTA Ao Root diam:  3.70 cm MITRAL VALVE MV Area (PHT): 3.46 cm     SHUNTS MV Decel Time: 219 msec     Systemic VTI:  0.26 m MV E velocity: 105.00 cm/s  Systemic Diam: 2.50 cm MV A velocity: 98.60 cm/s MV E/A ratio:  1.06 Lyman Bishop MD  Electronically signed by Lyman Bishop MD Signature Date/Time: 04/04/2022/2:26:13 PM    Final      Scheduled Meds:  aspirin EC  81 mg Oral Daily   atorvastatin  40 mg Oral Daily   citalopram  40 mg Oral Daily   furosemide  40 mg Intravenous BID   gabapentin  300 mg Oral TID   gemfibrozil  600 mg Oral BID AC   heparin  5,000 Units Subcutaneous Q8H   hydrALAZINE  50 mg Oral TID   insulin aspart  0-5 Units Subcutaneous QHS   insulin aspart  0-9 Units Subcutaneous TID WC   insulin aspart  6 Units Subcutaneous TID WC   insulin glargine-yfgn  60 Units Subcutaneous BID   metoprolol tartrate  100 mg Oral BID   pantoprazole  40 mg Oral Daily   sodium chloride flush  3 mL Intravenous Q12H   sodium chloride HYPERTONIC  4 mL Nebulization BID   Continuous Infusions:   LOS: 2 days    Time spent: 73mn    PDomenic Polite MD [facility]   04/06/2022, 11:22 AM

## 2022-04-07 ENCOUNTER — Encounter (HOSPITAL_COMMUNITY): Payer: Self-pay | Admitting: Internal Medicine

## 2022-04-07 ENCOUNTER — Encounter (HOSPITAL_COMMUNITY): Payer: Self-pay | Admitting: Family Medicine

## 2022-04-07 DIAGNOSIS — J9601 Acute respiratory failure with hypoxia: Secondary | ICD-10-CM | POA: Diagnosis not present

## 2022-04-07 LAB — BASIC METABOLIC PANEL
Anion gap: 11 (ref 5–15)
BUN: 44 mg/dL — ABNORMAL HIGH (ref 6–20)
CO2: 24 mmol/L (ref 22–32)
Calcium: 8.6 mg/dL — ABNORMAL LOW (ref 8.9–10.3)
Chloride: 102 mmol/L (ref 98–111)
Creatinine, Ser: 3.17 mg/dL — ABNORMAL HIGH (ref 0.61–1.24)
GFR, Estimated: 22 mL/min — ABNORMAL LOW (ref >60)
Glucose, Bld: 123 mg/dL — ABNORMAL HIGH (ref 70–99)
Potassium: 3.5 mmol/L (ref 3.5–5.1)
Sodium: 137 mmol/L (ref 135–145)

## 2022-04-07 LAB — CBC
HCT: 30.5 % — ABNORMAL LOW (ref 39.0–52.0)
Hemoglobin: 9.9 g/dL — ABNORMAL LOW (ref 13.0–17.0)
MCH: 28.5 pg (ref 26.0–34.0)
MCHC: 32.5 g/dL (ref 30.0–36.0)
MCV: 87.9 fL (ref 80.0–100.0)
Platelets: 398 10*3/uL (ref 150–400)
RBC: 3.47 MIL/uL — ABNORMAL LOW (ref 4.22–5.81)
RDW: 13.8 % (ref 11.5–15.5)
WBC: 11.2 10*3/uL — ABNORMAL HIGH (ref 4.0–10.5)
nRBC: 0 % (ref 0.0–0.2)

## 2022-04-07 LAB — GLUCOSE, CAPILLARY
Glucose-Capillary: 185 mg/dL — ABNORMAL HIGH (ref 70–99)
Glucose-Capillary: 178 mg/dL — ABNORMAL HIGH (ref 70–99)
Glucose-Capillary: 230 mg/dL — ABNORMAL HIGH (ref 70–99)
Glucose-Capillary: 174 mg/dL — ABNORMAL HIGH (ref 70–99)

## 2022-04-07 MED ORDER — HYDRALAZINE HCL 50 MG PO TABS
75.0000 mg | ORAL_TABLET | Freq: Three times a day (TID) | ORAL | Status: DC
  Administered 2022-04-07 – 2022-04-08 (×3): 75 mg via ORAL
  Filled 2022-04-07 (×3): qty 1

## 2022-04-07 NOTE — Evaluation (Signed)
Physical Therapy Evaluation Patient Details Name: Kristopher Simon MRN: [id number] DOB: 1966/05/16 Today's Date: 04/07/2022  History of Present Illness  Pt is a 56 year old man admitted on 04/03/22 with SOB x 1 month. +pulmonary edema, 30lb weight gain due to acute on chronic CHF. PMH: dCHF, HTN, CKD IV, CAD , CABG, IDDM, L BKA.  Clinical Impression   Pt presents with impaired understanding of energy conservation, breathing technique, as well as decreased activity tolerance vs baseline. Pt to benefit from acute PT to address deficits. Pt ambulated good hallway distance without AD and prosthesis donned, pt needing cuing for rest breaks and breathing technique. SpO26mn 86% on RA which quickly recovered to 88% and greater with rest and breathing. PT to progress mobility as tolerated, and will continue to follow acutely.         Recommendations for follow up therapy are one component of a multi-disciplinary discharge planning process, led by the attending physician.  Recommendations may be updated based on patient status, additional functional criteria and insurance authorization.  Follow Up Recommendations No PT follow up      Assistance Recommended at Discharge PRN  Patient can return home with the following       Equipment Recommendations None recommended by PT  Recommendations for Other Services       Functional Status Assessment Patient has had a recent decline in their functional status and demonstrates the ability to make significant improvements in function in a reasonable and predictable amount of time.     Precautions / Restrictions Precautions Precautions: None Precaution Comments: L BKA, wound to R tibia Restrictions Weight Bearing Restrictions: No      Mobility  Bed Mobility Overal bed mobility: Modified Independent                  Transfers Overall transfer level: Modified independent                      Ambulation/Gait Ambulation/Gait  assistance: Modified independent (Device/Increase time) Gait Distance (Feet): 160 Feet Assistive device: None Gait Pattern/deviations: Step-through pattern, Decreased stride length, Wide base of support Gait velocity: decr     General Gait Details: mod I for increased time, cuing for breathing technique and rest breaks as needed to recover dyspnea and accompanying SpO2 desaturation. SpO233m 86% on RA during gait, quickly recovered >88% with breathing technqiue adn rest.  Stairs            Wheelchair Mobility    Modified Rankin (Stroke Patients Only)       Balance Overall balance assessment: Modified Independent   Sitting balance-Leahy Scale: Good       Standing balance-Leahy Scale: Good                               Pertinent Vitals/Pain Pain Assessment Pain Assessment: No/denies pain    Home Living Family/patient expects to be discharged to:: Private residence Living Arrangements: Parent Available Help at Discharge: Family;Available 24 hours/day Type of Home: House Home Access: Ramped entrance       Home Layout: One level Home Equipment: Shower seat;Grab bars - tub/shower      Prior Function Prior Level of Function : Independent/Modified Independent             Mobility Comments: walks without a device, drives, on disability ADLs Comments: sit to shower     Hand Dominance   Dominant Hand: Right  Extremity/Trunk Assessment   Upper Extremity Assessment Upper Extremity Assessment: Defer to OT evaluation    Lower Extremity Assessment Lower Extremity Assessment: Overall WFL for tasks assessed;LLE deficits/detail LLE Deficits / Details: dons/doffs prosthetic indep    Cervical / Trunk Assessment Cervical / Trunk Assessment: Other exceptions (obesity)  Communication   Communication: No difficulties  Cognition Arousal/Alertness: Awake/alert Behavior During Therapy: WFL for tasks assessed/performed Overall Cognitive Status:  Within Functional Limits for tasks assessed                                          General Comments      Exercises     Assessment/Plan    PT Assessment Patient needs continued PT services  PT Problem List Decreased safety awareness;Decreased knowledge of precautions;Cardiopulmonary status limiting activity       PT Treatment Interventions Therapeutic activities;Patient/family education;Functional mobility training    PT Goals (Current goals can be found in the Care Plan section)  Acute Rehab PT Goals Patient Stated Goal: home PT Goal Formulation: With patient Time For Goal Achievement: 04/21/22 Potential to Achieve Goals: Good    Frequency Min 3X/week     Co-evaluation               AM-PAC PT "6 Clicks" Mobility  Outcome Measure Help needed turning from your back to your side while in a flat bed without using bedrails?: None Help needed moving from lying on your back to sitting on the side of a flat bed without using bedrails?: None Help needed moving to and from a bed to a chair (including a wheelchair)?: None Help needed standing up from a chair using your arms (e.g., wheelchair or bedside chair)?: None Help needed to walk in hospital room?: A Little Help needed climbing 3-5 steps with a railing? : A Little 6 Click Score: 22    End of Session   Activity Tolerance: Patient tolerated treatment well Patient left: in chair;with call bell/phone within reach Nurse Communication: Mobility status PT Visit Diagnosis: Other abnormalities of gait and mobility (R26.89)    Time: 1046-1100 PT Time Calculation (min) (ACUTE ONLY): 14 min   Charges:   PT Evaluation $PT Eval Low Complexity: 1 Low        Betsi Crespi S, PT DPT Acute Rehabilitation Services Pager [phone]  Office [phone]   Roxine Caddy E Ruffin Pyo 04/07/2022, 12:19 PM

## 2022-04-07 NOTE — Progress Notes (Signed)
Heart Failure Stewardship Pharmacist Progress Note   PCP: [place], Sarah T, MD PCP-Cardiologist: None    HPI:  56 yo M with PMH of CHF, HTN, CKD IV, CAD s/p CABG, T2DM, L AKA, and obesity. He presented to the ED on 8/14 with shortness of breath, chest pain radiating to his back, and edema. CXR with increased vascular congestion. An ECHO was done and LVEF is 60-65% (was 50-55% in 08/2018) with mild LVH, no wall motion abnormalities, and G1DD.   Current HF Medications: Diuretic: furosemide 40 mg PO daily Beta Blocker: metoprolol tartrate 75 mg BID Other: hydralazine 50 mg TID  Prior to admission HF Medications: Diuretic: furosemide 20 mg daily Beta blocker: metoprolol tartrate 100 mg BID Other: hydralazine 75 mg BID  Pertinent Lab Values: Serum creatinine 3.17, BUN 44, Potassium 3.5, Sodium 137, BNP 98.3, Magnesium 2.0, A1c 10.1 (08/2018)   Vital Signs: Weight: 424 lbs (admission weight: 434 lbs) Blood pressure: 150-170/60s  Heart rate: 60-70s  I/O: -3.9L yesterday; net -12.6L  Medication Assistance / Insurance Benefits Check: Does the patient have prescription insurance?  Yes Type of insurance plan: [place]  Outpatient Pharmacy:  Prior to admission outpatient pharmacy: [facility] Is the patient willing to use [facility] at discharge? Yes Is the patient willing to transition their outpatient pharmacy to utilize a [facility] outpatient pharmacy?   Pending    Assessment: 1. Acute on chronic diastolic CHF (LVEF 78-77%). NYHA class II symptoms. - Continue furosemide 40 mg PO daily - Continue metoprolol tartrate 50 mg BID - On hydralazine 50 mg TID. BP elevated. Consider increasing to 75 mg TID. - No ACE/ARB/ARNI, MRA, or SGLT2i with advanced CKD. eGFR borderline for SGLT2i. - Stop amlodipine on discharge with risk of edema   Plan: 1) Medication changes recommended at this time: - Increase hydralazine to 75 mg TID  2) Patient assistance: - None needed, has [place]  Medicaid  3)  Education  - To be completed prior to discharge  Kerby Nora, PharmD, BCPS Heart Failure Stewardship Pharmacist Phone [phone]

## 2022-04-07 NOTE — Progress Notes (Signed)
PROGRESS NOTE    Kristopher Simon  DTO:[id number] DOB: 29-Jan-1966 DOA: 04/04/2022 PCP: Martinique, Sarah T, MD  56 y.o M h/o BMI 59, chronic diastolic CHF, hypertension, CKD stage IV, CAD status post CABG, insulin-dependent diabetes mellitus, admitted 8/13 for SOB, x 2 weeks + 30 pound weight gain in the past month, and has developed orthopnea over the past few days. He reports a history of DVT approximately 10 years ago that followed a surgery.  He is no longer anticoagulated  Subjective: -Feels so much better, breathing has improved a lot  Assessment and Plan:   Acute on chronic diastolic CHF;  Acute hypoxic respiratory failure  - Presents with SOB at rest after 2 wks of progressive DOE, leg swelling, wt gain, and orthopnea   -Echo with preserved EF, grade 1 diastolic dysfunction -Diuresed with IV Lasix he is 12.6 L negative, complicated by CKD 4, mild bump in creatinine -Switch to oral Lasix today, monitor urine output on this -GDMT limited by CKD 4 -Ambulate, PT eval    Hypertensive urgency  - SBP as high as 203 in ED, improved with IV Lasix  - Continue diuresis, continue hydralazine, Toprol,     CAD  - No anginal complaints  - Continue ASA, Lipitor, Toprol      IDDM  - A1c was 8.0% in May 2023  -Stable  Peripheral vascular disease -Left BKA    CKD IV  - SCr is 2.96 in ED, baseline around 2.7-2.8 -Mild bump in creatinine, he is followed [facility] nephrology [facility] -Transition to oral diuretics    Hx of CVA  - Continue ASA 81 and Lipitor      Leukocytosis  - Appears chronic, he is afebrile  -Improving, follow-up with hematology     DVT prophylaxis: sq heparin  Code Status: Full  Family Communication: Discussed with patient detail, no family at bedside Disposition Plan: Home tomorrow if stable  Consultants:    Procedures:   Antimicrobials:    Objective: Vitals:   04/06/22 2028 04/06/22 2357 04/07/22 0423 04/07/22 0839  BP: (!) 155/64 (!) 145/66 (!)  159/57 (!) 154/56  Pulse: 62 62 66 70  Resp: '14 13 13 18  '$ Temp: 98 F (36.7 C) 98.4 F (36.9 C) 97.9 F (36.6 C) 98 F (36.7 C)  TempSrc: Oral Oral Oral Oral  SpO2: 93% 92% 92% 94%  Weight:      Height:        Intake/Output Summary (Last 24 hours) at 04/07/2022 1209 Last data filed at 04/07/2022 0841 Gross per 24 hour  Intake 480 ml  Output 4275 ml  Net -3795 ml   Filed Weights   04/04/22 0126 04/06/22 0652  Weight: (!) 197 kg (!) 192.4 kg    Examination:  General exam: Obese pleasant male sitting up in bed, AAOx3, no distress HEENT: No JVD CVS: S1-S2, regular rhythm Abdomen: Soft, nontender, bowel sounds present Extremities: No edema, left BKA  Skin: No rashes Psychiatry:  Mood & affect appropriate.     Data Reviewed:   CBC: Recent Labs  Lab 04/04/22 0249 04/05/22 0458 04/06/22 0506 04/07/22 0358  WBC 16.0* 12.1* 10.6* 11.2*  NEUTROABS 13.5*  --   --   --   HGB 10.3* 10.3* 9.9* 9.9*  HCT 32.6* 31.1* 31.0* 30.5*  MCV 90.8 86.9 89.3 87.9  PLT 343 350 370 983   Basic Metabolic Panel: Recent Labs  Lab 04/04/22 0249 04/04/22 1307 04/05/22 0458 04/06/22 0506 04/07/22 0358  NA 133*  137 136 136 137  K 4.0 4.0 3.6 3.5 3.5  CL 104 108 104 104 102  CO2 18* '22 23 22 24  '$ GLUCOSE 299* 328* 259* 130* 123*  BUN 34* 37* 40* 44* 44*  CREATININE 2.96* 2.88* 3.06* 3.23* 3.17*  CALCIUM 8.4* 8.3* 8.7* 8.5* 8.6*  MG  --   --  2.0  --   --   PHOS  --  3.5  --   --   --    GFR: Estimated Creatinine Clearance: 45.5 mL/min (A) (by C-G formula based on SCr of 3.17 mg/dL (H)). Liver Function Tests: Recent Labs  Lab 04/04/22 1307  ALBUMIN 2.6*   No results for input(s): "LIPASE", "AMYLASE" in the last 168 hours. No results for input(s): "AMMONIA" in the last 168 hours. Coagulation Profile: No results for input(s): "INR", "PROTIME" in the last 168 hours. Cardiac Enzymes: No results for input(s): "CKTOTAL", "CKMB", "CKMBINDEX", "TROPONINI" in the last 168  hours. BNP (last 3 results) No results for input(s): "PROBNP" in the last 8760 hours. HbA1C: No results for input(s): "HGBA1C" in the last 72 hours. CBG: Recent Labs  Lab 04/06/22 0821 04/06/22 1223 04/06/22 1640 04/06/22 2148 04/07/22 0842  GLUCAP 132* 139* 199* 98 185*   Lipid Profile: No results for input(s): "CHOL", "HDL", "LDLCALC", "TRIG", "CHOLHDL", "LDLDIRECT" in the last 72 hours. Thyroid Function Tests: No results for input(s): "TSH", "T4TOTAL", "FREET4", "T3FREE", "THYROIDAB" in the last 72 hours. Anemia Panel: No results for input(s): "VITAMINB12", "FOLATE", "FERRITIN", "TIBC", "IRON", "RETICCTPCT" in the last 72 hours. Urine analysis: No results found for: "COLORURINE", "APPEARANCEUR", "LABSPEC", "PHURINE", "GLUCOSEU", "HGBUR", "BILIRUBINUR", "KETONESUR", "PROTEINUR", "UROBILINOGEN", "NITRITE", "LEUKOCYTESUR" Sepsis Labs: '@LABRCNTIP'$ (procalcitonin:4,lacticidven:4)  )No results found for this or any previous visit (from the past 240 hour(s)).   Radiology Studies: No results found.   Scheduled Meds:  aspirin EC  81 mg Oral Daily   atorvastatin  40 mg Oral Daily   citalopram  40 mg Oral Daily   furosemide  40 mg Oral Daily   gabapentin  300 mg Oral BID   gemfibrozil  600 mg Oral BID AC   heparin  5,000 Units Subcutaneous Q8H   hydrALAZINE  50 mg Oral TID   insulin aspart  0-5 Units Subcutaneous QHS   insulin aspart  0-9 Units Subcutaneous TID WC   insulin aspart  6 Units Subcutaneous TID WC   insulin glargine-yfgn  60 Units Subcutaneous BID   metoprolol tartrate  75 mg Oral BID   pantoprazole  40 mg Oral Daily   sodium chloride flush  3 mL Intravenous Q12H   Continuous Infusions:   LOS: 3 days    Time spent: 69mn    PDomenic Polite MD [facility]   04/07/2022, 12:09 PM

## 2022-04-07 NOTE — Evaluation (Signed)
Occupational Therapy Evaluation and Discharge Patient Details Name: Kristopher Simon MRN: [id number] DOB: October 14, 1965 Today's Date: 04/07/2022   History of Present Illness Pt is a 56 year old man admitted on 04/03/22 with SOB x 1 month. +pulmonary edema, 30lb weight gain due to acute on chronic CHF. PMH: dCHF, HTN, CKD IV, CAD , CABG, IDDM, L BKA.   Clinical Impression   Pt is functioning modified independent in ADLs and mobility. Sp02 noted to drop to 87% with ambulation with dyspnea. Pt recovered with seated rest break and pursed lip breathing. Pt educated in energy conservation strategies and verbalized understanding. No further OT needs.     Recommendations for follow up therapy are one component of a multi-disciplinary discharge planning process, led by the attending physician.  Recommendations may be updated based on patient status, additional functional criteria and insurance authorization.   Follow Up Recommendations  No OT follow up    Assistance Recommended at Discharge PRN  Patient can return home with the following      Functional Status Assessment  Patient has had a recent decline in their functional status and demonstrates the ability to make significant improvements in function in a reasonable and predictable amount of time.  Equipment Recommendations  None recommended by OT    Recommendations for Other Services       Precautions / Restrictions Precautions Precautions: None Restrictions Weight Bearing Restrictions: No      Mobility Bed Mobility Overal bed mobility: Modified Independent                  Transfers Overall transfer level: Modified independent Equipment used: None                      Balance Overall balance assessment: Needs assistance   Sitting balance-Leahy Scale: Good       Standing balance-Leahy Scale: Good                             ADL either performed or assessed with clinical judgement   ADL  Overall ADL's : Modified independent                                       General ADL Comments: Educated in pursed lip breathing and energy conservation.     Vision Ability to See in Adequate Light: 1 Impaired Patient Visual Report: No change from baseline Additional Comments: legally blind in L eye     Perception     Praxis      Pertinent Vitals/Pain Pain Assessment Pain Assessment: No/denies pain     Hand Dominance Right   Extremity/Trunk Assessment Upper Extremity Assessment Upper Extremity Assessment: Overall WFL for tasks assessed   Lower Extremity Assessment Lower Extremity Assessment: Defer to PT evaluation   Cervical / Trunk Assessment Cervical / Trunk Assessment: Other exceptions (obesity)   Communication Communication Communication: No difficulties   Cognition Arousal/Alertness: Awake/alert Behavior During Therapy: WFL for tasks assessed/performed Overall Cognitive Status: Within Functional Limits for tasks assessed                                       General Comments       Exercises     Shoulder Instructions  Home Living Family/patient expects to be discharged to:: Private residence Living Arrangements: Parent Available Help at Discharge: Family;Available 24 hours/day Type of Home: House Home Access: Ramped entrance     Home Layout: One level     Bathroom Shower/Tub: Occupational psychologist: Handicapped height     Home Equipment: Shower seat;Grab bars - tub/shower          Prior Functioning/Environment Prior Level of Function : Independent/Modified Independent             Mobility Comments: walks without a device, drives, on disability ADLs Comments: sit to shower        OT Problem List:        OT Treatment/Interventions: Energy conservation    OT Goals(Current goals can be found in the care plan section)    OT Frequency:      Co-evaluation              AM-PAC  OT "6 Clicks" Daily Activity     Outcome Measure Help from another person eating meals?: None Help from another person taking care of personal grooming?: None Help from another person toileting, which includes using toliet, bedpan, or urinal?: None Help from another person bathing (including washing, rinsing, drying)?: None Help from another person to put on and taking off regular upper body clothing?: None Help from another person to put on and taking off regular lower body clothing?: None 6 Click Score: 24   End of Session Nurse Communication: Mobility status  Activity Tolerance: Patient tolerated treatment well Patient left: in chair;with call bell/phone within reach  OT Visit Diagnosis: Other abnormalities of gait and mobility (R26.89)                Time: 1100-1113 OT Time Calculation (min): 13 min Charges:  OT General Charges $OT Visit: 1 Visit OT Evaluation $OT Eval Low Complexity: 1 Low  Cleta Alberts, OTR/L Acute Rehabilitation Services Office: [phone]   Malka So 04/07/2022, 11:20 AM

## 2022-04-07 NOTE — Progress Notes (Signed)
Heart Failure Nurse Navigator Progress Note  PCP: Martinique, Sarah T, MD PCP-Cardiologist: None Admission Diagnosis: None Admitted from: Home via EMS  Presentation:   Kristopher Simon presented with shortness of breath while driving 4 hours, traveled this past weekend and didn't take his lasix x 1 day. swelling and redness to right leg ( hx of blood clots), left leg AKA, chest pain that radiated to his back, 30-40 lb weight gain recently. BP 173/70, HR 86, BMI 58.90, BNP 98.3, Trop 20, Creat 3.17. IV Lasix given.   Patient was educated on the sign and symptoms of heart failure, Daily weights ( states he will buy a new scale from [facility] for over 400 Lbs, Diet/ fluid restrictions, ( patient cans his own veggies, and goes to [employer] every 2 weeks with his mom. ) Taking his medications as prescribed, ( patient stated he works thru the night and at times he takes his night meds in the morning, and sometimes misses his lasix doses due to his work or sleeping schedules.) Attending all his medical appointments. Patient was very interactive in education and asked a number of questions. A scheduled HF TOC appointment for 04/18/2022 @ 2 pm.   ECHO/ LVEF: 60-65%   Clinical Course:  Past Medical History:  Diagnosis Date   Acute renal failure ([place]) 01/2012   CKD   Anxiety    Arthritis    hands   Cellulitis    hx left leg    Cellulitis of left lower extremity 07/06/2014   Claustrophobia    Concussion    Coronary artery disease    Dehiscence of amputation stump (HCC)    Depression    Diabetes mellitus 1995   Type II   DVT (deep venous thrombosis) ([place]) 2014   left leg   Fibromyalgia    Foot abscess, left 09/12/2018   GERD (gastroesophageal reflux disease)    HLD (hyperlipidemia)    Hypertension 1998   Neuropathy    feet- below knee   Panic attacks    Peripheral vascular disease ([facility]CC)    left leg, right neck   PTSD (post-traumatic stress disorder)    Sleep apnea    Stroke ([facility])    x 3   memory loss - left hand- and left lower extremity-shakes at times- closes by it self- 06/2017- last one   Wears glasses    blind in left eye, readers     Social History   Socioeconomic History   Marital status: Divorced    Spouse name: Not on file   Number of children: 0   Years of education: Not on file   Highest education level: High school graduate  Occupational History   Occupation: Disabled  Tobacco Use   Smoking status: Never   Smokeless tobacco: Never  Vaping Use   Vaping Use: Never used  Substance and Sexual Activity   Alcohol use: Yes    Alcohol/week: 1.0 standard drink of alcohol    Types: 1 Glasses of wine per week    Comment: occasional -social   Drug use: No   Sexual activity: Yes    Birth control/protection: None  Other Topics Concern   Not on file  Social History Narrative   Not on file   Social Determinants of Health   Financial Resource Strain: Low Risk  (04/07/2022)   Overall Financial Resource Strain (CARDIA)    Difficulty of Paying Living Expenses: Not hard at all  Food Insecurity: No Food Insecurity (04/07/2022)   Hunger Vital  Sign    Worried About Charity fundraiser in the Last Year: Never true    Twilight in the Last Year: Never true  Transportation Needs: No Transportation Needs (04/07/2022)   PRAPARE - Hydrologist (Medical): No    Lack of Transportation (Non-Medical): No  Physical Activity: Not on file  Stress: Not on file  Social Connections: Not on file   Education Assessment and Provision:  Detailed education and instructions provided on heart failure disease management including the following:  Signs and symptoms of Heart Failure When to call the physician Importance of daily weights Low sodium diet Fluid restriction Medication management Anticipated future follow-up appointments  Patient education given on each of the above topics.  Patient acknowledges understanding via teach back method and  acceptance of all instructions.  Education Materials:  "Living Better With Heart Failure" Booklet, HF zone tool, & Daily Weight Tracker Tool.  Patient has scale at home: No, ordering from [employer] (400 lb + scale) Patient has pill box at home: NA    High Risk Criteria for Readmission and/or Poor Patient Outcomes: Heart failure hospital admissions (last 6 months): 0  No Show rate: 20% Difficult social situation: No Demonstrates medication adherence: No, sometimes takes meds at odd hours, or doesn't take lasix Primary Language: English Literacy level: Reading, writing, and comprehension.  Barriers of Care:   Diet/ fluid restrictions Daily weights Medication compliance  Considerations/Referrals:   Referral made to Heart Failure Pharmacist Stewardship: Yes Referral made to Heart Failure CSW/NCM TOC: No Referral made to Heart & Vascular TOC clinic: Yes, 04/18/2022 @ 2 pm per Dr. Broadus John.  Items for Follow-up on DC/TOC: Medication compliance ( lasix, med timing with work schedule) Diet/ fluid restrictions (Salt ([employer])  Daily weights (scale from [employer] for 400+)    Earnestine Leys, BSN, RN Heart Failure Transport planner Only

## 2022-04-08 ENCOUNTER — Other Ambulatory Visit (HOSPITAL_COMMUNITY): Payer: Self-pay

## 2022-04-08 DIAGNOSIS — J9601 Acute respiratory failure with hypoxia: Secondary | ICD-10-CM | POA: Diagnosis not present

## 2022-04-08 LAB — BASIC METABOLIC PANEL
Anion gap: 8 (ref 5–15)
BUN: 44 mg/dL — ABNORMAL HIGH (ref 6–20)
CO2: 23 mmol/L (ref 22–32)
Calcium: 8.4 mg/dL — ABNORMAL LOW (ref 8.9–10.3)
Chloride: 102 mmol/L (ref 98–111)
Creatinine, Ser: 3.02 mg/dL — ABNORMAL HIGH (ref 0.61–1.24)
GFR, Estimated: 23 mL/min — ABNORMAL LOW (ref >60)
Glucose, Bld: 164 mg/dL — ABNORMAL HIGH (ref 70–99)
Potassium: 3.6 mmol/L (ref 3.5–5.1)
Sodium: 133 mmol/L — ABNORMAL LOW (ref 135–145)

## 2022-04-08 LAB — CBC
HCT: 31.3 % — ABNORMAL LOW (ref 39.0–52.0)
Hemoglobin: 10.1 g/dL — ABNORMAL LOW (ref 13.0–17.0)
MCH: 28.5 pg (ref 26.0–34.0)
MCHC: 32.3 g/dL (ref 30.0–36.0)
MCV: 88.2 fL (ref 80.0–100.0)
Platelets: 408 10*3/uL — ABNORMAL HIGH (ref 150–400)
RBC: 3.55 MIL/uL — ABNORMAL LOW (ref 4.22–5.81)
RDW: 13.5 % (ref 11.5–15.5)
WBC: 11 10*3/uL — ABNORMAL HIGH (ref 4.0–10.5)
nRBC: 0 % (ref 0.0–0.2)

## 2022-04-08 LAB — GLUCOSE, CAPILLARY: Glucose-Capillary: 123 mg/dL — ABNORMAL HIGH (ref 70–99)

## 2022-04-08 MED ORDER — GABAPENTIN 300 MG PO CAPS: 300.0000 mg | ORAL_CAPSULE | Freq: Two times a day (BID) | ORAL | 0 refills | Status: DC

## 2022-04-08 MED ORDER — HYDRALAZINE HCL 50 MG PO TABS: 100.0000 mg | ORAL_TABLET | Freq: Three times a day (TID) | ORAL | Status: DC

## 2022-04-08 MED ORDER — FUROSEMIDE 40 MG PO TABS
40.0000 mg | ORAL_TABLET | Freq: Every day | ORAL | 0 refills | Status: DC
  Filled 2022-04-08: qty 30, 30d supply, fill #0

## 2022-04-08 MED ORDER — HYDRALAZINE HCL 100 MG PO TABS
100.0000 mg | ORAL_TABLET | Freq: Three times a day (TID) | ORAL | 0 refills | Status: DC
  Filled 2022-04-08: qty 90, 30d supply, fill #0

## 2022-04-08 NOTE — Discharge Summary (Signed)
Physician Discharge Summary  Kristopher Simon ZYS:[id number] DOB: [birth year] DOA: 04/04/2022  PCP: Martinique, Sarah T, MD  Admit date: 04/04/2022 Discharge date: 04/08/2022  Time spent: 35  minutes  Recommendations for Outpatient Follow-up:  CH[facility] clinic on 8/28 Nephrology Dr.Nwobu at [place] in [facility]   Discharge Diagnoses:  Principal Problem:   Acute respiratory failure with hypoxia ([place]) Acute on chronic diastolic CHF   Uncontrolled type 2 diabetes mellitus with hyperglycemia (HCC)   CAD (coronary artery disease), native coronary artery   Acute on chronic diastolic CHF (congestive heart failure) (HCC)   Hypertensive urgency   History of cardioembolic cerebrovascular accident (CVA)   Anxiety   CKD (chronic kidney disease), stage IV ([place])   Discharge Condition: Stable  Diet recommendation: Close odium, heart healthy  Filed Weights   04/04/22 0126 04/06/22 0652 04/08/22 0441  Weight: (!) 197 kg (!) 192.4 kg (!) 186.2 kg    History of present illness:  56 y.o M h/o BMI 59, chronic diastolic CHF, hypertension, CKD stage IV, CAD status post CABG, insulin-dependent diabetes mellitus, admitted 8/13 for SOB, x 2 weeks + 30 pound weight gain in the past month, and has developed orthopnea over the past few days.   Hospital Course:    Acute on chronic diastolic CHF;  Acute hypoxic respiratory failure  - Presents with SOB at rest after 2 wks of progressive DOE, leg swelling, wt gain, and orthopnea   -Echo with preserved EF, grade 1 diastolic dysfunction -Diuresed with IV Lasix he is 14 L negative, complicated by CKD 4,  -Switched over to oral Lasix yesterday -GDMT limited by CKD 4 - improved and stable now, discharged home in in a stable condition, has CHF TOC follow-up arranged  CKD IV  - SCr is 2.96 in ED, baseline around 2.8 -Now creatinine in the 3 range, he is followed by nephrology at [facility] -Transitioned to oral diuretics, follow-up with Dr.Nwobu in 1 to 2  months    Hypertensive urgency  - SBP as high as 203 in ED, improved with IV Lasix  - Continue Lasix, continue hydralazine-dose increased, Toprol,     CAD  - No anginal complaints  - Continue ASA, Lipitor, Toprol      IDDM  - A1c was 8.0% in May 2023  -Stable   Peripheral vascular disease -Left BKA     Hx of CVA  - Continue ASA 81 and Lipitor      Leukocytosis  - Appears chronic, he is afebrile  -Improving, mild, recommend PCP recheck this in 2 weeks, if leukocytosis persists consider hematology referral      Discharge Exam: Vitals:   04/08/22 0353 04/08/22 0708  BP: (!) 158/75 (!) 185/66  Pulse: 60 (!) 59  Resp: 12 16  Temp: 97.7 F (36.5 C) (!) 96.9 F (36.1 C)  SpO2: 94% 95%     Discharge Instructions   Discharge Instructions     Diet - low sodium heart healthy   Complete by: As directed    Diet Carb Modified   Complete by: As directed    Discharge wound care:   Complete by: As directed    routine   Increase activity slowly   Complete by: As directed       Allergies as of 04/08/2022       Reactions   Bactrim [sulfamethoxazole-trimethoprim] Anaphylaxis, Swelling    (BACTRIM) tongue swells   Lisinopril Other (See Comments)   Doctor took Pt off medication due to  renal failure   Lyrica [pregabalin] Swelling   Liraglutide Nausea Only   Magnesium Swelling   Vancomycin Other (See Comments)   Causes renal failure        Medication List     TAKE these medications    amLODipine 10 MG tablet Commonly known as: NORVASC Take 10 mg by mouth at bedtime.   aspirin EC 81 MG tablet Take 1 tablet (81 mg total) by mouth daily.   atorvastatin 40 MG tablet Commonly known as: LIPITOR Take 40 mg by mouth daily.   citalopram 40 MG tablet Commonly known as: CELEXA Take 1 tablet (40 mg total) by mouth daily.   cyclobenzaprine 5 MG tablet Commonly known as: FLEXERIL Take 1 tablet (5 mg total) by mouth 3 (three) times daily as needed for muscle  spasms.   furosemide 40 MG tablet Commonly known as: LASIX Take 1 tablet (40 mg total) by mouth daily and take an extra dose for weight gain of 2-3 pounds overnight or 5 pounds in 1 week What changed:  medication strength how much to take   gabapentin 300 MG capsule Commonly known as: NEURONTIN Take 1 capsule (300 mg total) by mouth 2 (two) times daily. What changed: when to take this   gemfibrozil 600 MG tablet Commonly known as: LOPID Take 1 tablet (600 mg total) by mouth 2 (two) times daily before a meal.   HumuLIN R U-500 KwikPen 500 UNIT/ML KwikPen Generic drug: insulin regular human CONCENTRATED Inject 140-150 Units into the skin See admin instructions. 150 units with breakfast and 140 units with supper   hydrALAZINE 100 MG tablet Commonly known as: APRESOLINE Take 1 tablet (100 mg total) by mouth 3 (three) times daily. What changed:  medication strength how much to take Another medication with the same name was removed. Continue taking this medication, and follow the directions you see here.   Metoprolol Tartrate 75 MG Tabs Take 75 mg by mouth 2 (two) times daily. What changed: how much to take   Mounjaro 7.5 MG/0.5ML Pen Generic drug: tirzepatide Inject 7.5 mg into the skin once a week.   nitroGLYCERIN 0.4 MG SL tablet Commonly known as: NITROSTAT Place under the tongue.   omeprazole 40 MG capsule Commonly known as: PRILOSEC Take 1 capsule (40 mg total) by mouth daily.   tadalafil 10 MG tablet Commonly known as: CIALIS Take 10 mg by mouth daily as needed.   traZODone 50 MG tablet Commonly known as: DESYREL Take 0.5-1 tablets (25-50 mg total) by mouth at bedtime as needed for sleep. What changed: how much to take               Discharge Care Instructions  (From admission, onward)           Start     Ordered   04/08/22 0000  Discharge wound care:       Comments: routine   04/08/22 1002           Allergies  Allergen Reactions    Bactrim [Sulfamethoxazole-Trimethoprim] Anaphylaxis and Swelling     (BACTRIM) tongue swells   Lisinopril Other (See Comments)    Doctor took Pt off medication due to renal failure   Lyrica [Pregabalin] Swelling   Liraglutide Nausea Only   Magnesium Swelling   Vancomycin Other (See Comments)    Causes renal failure    Follow-up Information     [facility] [place]. Go in 10 day(s).   Specialty: Cardiology Why:  Hospital follow up PLEASE bring a current medication list to appointment FREE Valet parking, Entrance C, off [employer] information: [address] [id number] [place] [phone]                 The results of significant diagnostics from this hospitalization (including imaging, microbiology, ancillary and laboratory) are listed below for reference.    Significant Diagnostic Studies: US RENAL  Result Date: 04/05/2022 CLINICAL DATA:  Acute kidney injury EXAM: RENAL / URINARY TRACT ULTRASOUND COMPLETE COMPARISON:  None Available. FINDINGS: Right Kidney: Renal measurements: 11.7 x 5.5 x 5.5 cm = volume: 182 mL. Echogenicity within normal limits. No mass or hydronephrosis visualized. Left Kidney: Renal measurements: 12.8 x 6.0 x 6.0 cm = volume: 239 mL. Echogenicity within normal limits. No mass or hydronephrosis visualized. Bladder: Appears normal for degree of bladder distention. Other: None. IMPRESSION: Normal renal sonogram. Electronically Signed   By: Kerby Moors M.D.   On: 04/05/2022 08:49   VAS [place] LOWER EXTREMITY VENOUS (DVT) (ONLY [facility] & [facility])  Result Date: 04/04/2022  Lower Venous DVT Study Patient Name:  Kristopher Simon  Date of Exam:   04/04/2022 Medical Rec #: [id number]        Accession #:    [id number] Date of Birth: May 08, 1966        Patient Gender: M Patient Age:   53 years Exam Location:  [facility] Procedure:      VAS [place] LOWER EXTREMITY VENOUS (DVT) Referring Phys:  TIMOTHY OPYD --------------------------------------------------------------------------------  Indications: Swelling.  Comparison Study: No previous exam noted. Performing Technologist: Bobetta Lime BS, RVT  Examination Guidelines: A complete evaluation includes B-mode imaging, spectral Doppler, color Doppler, and power Doppler as needed of all accessible portions of each vessel. Bilateral testing is considered an integral part of a complete examination. Limited examinations for reoccurring indications may be performed as noted. The reflux portion of the exam is performed with the patient in reverse Trendelenburg.  +---------+---------------+---------+-----------+----------+--------------+ RIGHT    CompressibilityPhasicitySpontaneityPropertiesThrombus Aging +---------+---------------+---------+-----------+----------+--------------+ CFV      Full                                                        +---------+---------------+---------+-----------+----------+--------------+ SFJ      Full                                                        +---------+---------------+---------+-----------+----------+--------------+ FV Prox  Full                                                        +---------+---------------+---------+-----------+----------+--------------+ FV Mid   Full                                                        +---------+---------------+---------+-----------+----------+--------------+ FV DistalFull                                                        +---------+---------------+---------+-----------+----------+--------------+  PFV      Full                                                        +---------+---------------+---------+-----------+----------+--------------+ POP      Full                                                        +---------+---------------+---------+-----------+----------+--------------+ PTV      Full                                                         +---------+---------------+---------+-----------+----------+--------------+ PERO     Full                                                        +---------+---------------+---------+-----------+----------+--------------+   +----+---------------+---------+-----------+----------+--------------+ LEFTCompressibilityPhasicitySpontaneityPropertiesThrombus Aging +----+---------------+---------+-----------+----------+--------------+ CFV Full           Yes      Yes                                 +----+---------------+---------+-----------+----------+--------------+     Summary: RIGHT: - No evidence of deep vein thrombosis in the lower extremity. No indirect evidence of obstruction proximal to the inguinal ligament. - A cystic structure is found in the popliteal fossa.  LEFT: - No evidence of common femoral vein obstruction.  *See table(s) above for measurements and observations. Electronically signed by Servando Snare MD on 04/04/2022 at 2:26:14 PM.    Final    ECHOCARDIOGRAM COMPLETE  Result Date: 04/04/2022    ECHOCARDIOGRAM REPORT   Patient Name:   Kristopher Simon Date of Exam: 04/04/2022 Medical Rec #:  [id number]       Height:       72.0 in Accession #:    [id number]      Weight:       434.3 lb Date of Birth:  06-27-1966       BSA:          2.962 m Patient Age:    65 years        BP:           156/63 mmHg Patient Gender: M               HR:           73 bpm. Exam Location:  Inpatient Procedure: 2D Echo, Intracardiac Opacification Agent, Cardiac Doppler and Color            Doppler Indications:    CHF  History:        Patient has no prior history of Echocardiogram examinations.                 CAD; Risk Factors:Hypertension and Diabetes.  Sonographer:  [place], FE, PE Referring Phys: [id number] TIMOTHY S OPYD  Sonographer Comments: Technically difficult study due to poor echo windows. IMPRESSIONS  1. Left ventricular ejection fraction, by  estimation, is 60 to 65%. The left ventricle has normal function. The left ventricle has no regional wall motion abnormalities. There is mild left ventricular hypertrophy. Left ventricular diastolic parameters are consistent with Grade I diastolic dysfunction (impaired relaxation).  2. Right ventricular systolic function was not well visualized. The right ventricular size is not well visualized. Tricuspid regurgitation signal is inadequate for assessing PA pressure.  3. The mitral valve is abnormal. Trivial mitral valve regurgitation.  4. The aortic valve was not well visualized. Aortic valve regurgitation is not visualized.  5. The inferior vena cava is normal in size with greater than 50% respiratory variability, suggesting right atrial pressure of 3 mmHg. Comparison(s): No prior Echocardiogram. FINDINGS  Left Ventricle: Left ventricular ejection fraction, by estimation, is 60 to 65%. The left ventricle has normal function. The left ventricle has no regional wall motion abnormalities. Definity contrast agent was given IV to delineate the left ventricular  endocardial borders. The left ventricular internal cavity size was normal in size. There is mild left ventricular hypertrophy. Left ventricular diastolic parameters are consistent with Grade I diastolic dysfunction (impaired relaxation). Indeterminate filling pressures. Right Ventricle: The right ventricular size is not well visualized. Right vetricular wall thickness was not well visualized. Right ventricular systolic function was not well visualized. Tricuspid regurgitation signal is inadequate for assessing PA pressure. Left Atrium: Left atrial size was normal in size. Right Atrium: Right atrial size was normal in size. Pericardium: There is no evidence of pericardial effusion. Mitral Valve: The mitral valve is abnormal. Mild mitral annular calcification. Trivial mitral valve regurgitation. Tricuspid Valve: The tricuspid valve is not well visualized. Tricuspid  valve regurgitation is not demonstrated. Aortic Valve: The aortic valve was not well visualized. Aortic valve regurgitation is not visualized. Pulmonic Valve: The pulmonic valve was not well visualized. Pulmonic valve regurgitation is not visualized. Aorta: The aortic root and ascending aorta are structurally normal, with no evidence of dilitation. Venous: The inferior vena cava is normal in size with greater than 50% respiratory variability, suggesting right atrial pressure of 3 mmHg. IAS/Shunts: No atrial level shunt detected by color flow Doppler.  LEFT VENTRICLE PLAX 2D LVIDd:         5.50 cm   Diastology LVIDs:         4.00 cm   LV e' medial:    6.06 cm/s LV PW:         1.40 cm   LV E/e' medial:  17.3 LV IVS:        1.20 cm   LV e' lateral:   8.55 cm/s LVOT diam:     2.50 cm   LV E/e' lateral: 12.3 LV SV:         129 LV SV Index:   44 LVOT Area:     4.91 cm  LEFT ATRIUM             Index LA diam:        4.70 cm 1.59 cm/m LA Vol (A2C):   93.7 ml 31.63 ml/m LA Vol (A4C):   84.1 ml 28.39 ml/m LA Biplane Vol: 94.8 ml 32.00 ml/m  AORTIC VALVE LVOT Vmax:   109.00 cm/s LVOT Vmean:  84.200 cm/s LVOT VTI:    0.263 m  AORTA Ao Root diam: 3.70 cm MITRAL VALVE MV Area (PHT): 3.46  cm     SHUNTS MV Decel Time: 219 msec     Systemic VTI:  0.26 m MV E velocity: 105.00 cm/s  Systemic Diam: 2.50 cm MV A velocity: 98.60 cm/s MV E/A ratio:  1.06 Lyman Bishop MD Electronically signed by Lyman Bishop MD Signature Date/Time: 04/04/2022/2:26:13 PM    Final    DG Chest 2 View  Result Date: 04/04/2022 CLINICAL DATA:  Chest pain and shortness of breath EXAM: CHEST - 2 VIEW COMPARISON:  12/08/2019 FINDINGS: Cardiac shadow is within normal limits. Increased vascular congestion is noted bilaterally. Diffuse somewhat rounded airspace opacity in the left mid lung is noted likely representing an acute infiltrate. No sizable effusion is noted. No bony abnormality is noted. IMPRESSION: Changes most consistent with lingular infiltrate.  Superimposed congestive changes are noted as well. Electronically Signed   By: Inez Catalina M.D.   On: 04/04/2022 02:10    Microbiology: No results found for this or any previous visit (from the past 240 hour(s)).   Labs: Basic Metabolic Panel: Recent Labs  Lab 04/04/22 1307 04/05/22 0458 04/06/22 0506 04/07/22 0358 04/08/22 0417  NA 137 136 136 137 133*  K 4.0 3.6 3.5 3.5 3.6  CL 108 104 104 102 102  CO2 '22 23 22 24 23  '$ GLUCOSE 328* 259* 130* 123* 164*  BUN 37* 40* 44* 44* 44*  CREATININE 2.88* 3.06* 3.23* 3.17* 3.02*  CALCIUM 8.3* 8.7* 8.5* 8.6* 8.4*  MG  --  2.0  --   --   --   PHOS 3.5  --   --   --   --    Liver Function Tests: Recent Labs  Lab 04/04/22 1307  ALBUMIN 2.6*   No results for input(s): "LIPASE", "AMYLASE" in the last 168 hours. No results for input(s): "AMMONIA" in the last 168 hours. CBC: Recent Labs  Lab 04/04/22 0249 04/05/22 0458 04/06/22 0506 04/07/22 0358 04/08/22 0417  WBC 16.0* 12.1* 10.6* 11.2* 11.0*  NEUTROABS 13.5*  --   --   --   --   HGB 10.3* 10.3* 9.9* 9.9* 10.1*  HCT 32.6* 31.1* 31.0* 30.5* 31.3*  MCV 90.8 86.9 89.3 87.9 88.2  PLT 343 350 370 398 408*   Cardiac Enzymes: No results for input(s): "CKTOTAL", "CKMB", "CKMBINDEX", "TROPONINI" in the last 168 hours. BNP: BNP (last 3 results) Recent Labs    04/04/22 0249  BNP 98.3    ProBNP (last 3 results) No results for input(s): "PROBNP" in the last 8760 hours.  CBG: Recent Labs  Lab 04/07/22 0842 04/07/22 1236 04/07/22 1611 04/07/22 2020 04/08/22 0751  GLUCAP 185* 178* 230* 174* 123*       Signed:  Domenic Polite MD.  [facility] 04/08/2022, 2:32 PM

## 2022-04-08 NOTE — Progress Notes (Signed)
PT AVS reviewed and pt verbalized understanding of all DC teaching and instructions. PT has all belonging in his possession. IV removed without complications. PT leaving with family as transport home.

## 2022-04-08 NOTE — TOC Transition Note (Signed)
Transition of Care [facility]) - CM/SW Discharge Note   Patient Details  Name: Kristopher Simon MRN: [id number] Date of Birth: 09-12-1965  [facility] CM/SW Contact:  Pollie Friar, RN Phone Number: 04/08/2022, 11:26 AM   Clinical Narrative:    Pt discharging home with self care no f/u per PT/OT. CM inquired about home oxygen and MD says not needed.  Pt has transportation home.    Final next level of care: Home/Self Care Barriers to Discharge: No Barriers Identified   Patient Goals and CMS Choice        Discharge Placement                       Discharge Plan and Services   Discharge Planning Services: CM Consult                                 Social Determinants of Health (SDOH) Interventions Food Insecurity Interventions: Intervention Not Indicated Financial Strain Interventions: Intervention Not Indicated Housing Interventions: Intervention Not Indicated Transportation Interventions: Intervention Not Indicated   Readmission Risk Interventions     No data to display

## 2022-04-08 NOTE — Progress Notes (Addendum)
Heart Failure Stewardship Pharmacist Progress Note   PCP: [place], Sarah T, MD PCP-Cardiologist: None    HPI:  56 yo M with PMH of CHF, HTN, CKD IV, CAD s/p CABG, T2DM, L AKA, and obesity. He presented to the ED on 8/14 with shortness of breath, chest pain radiating to his back, and edema. CXR with increased vascular congestion. An ECHO was done and LVEF is 60-65% (was 50-55% in 08/2018) with mild LVH, no wall motion abnormalities, and G1DD.   Discharge HF Medications: Diuretic: furosemide 40 mg PO daily Beta Blocker: metoprolol tartrate 75 mg BID Other: hydralazine 100 mg TID  Prior to admission HF Medications: Diuretic: furosemide 20 mg daily Beta blocker: metoprolol tartrate 100 mg BID Other: hydralazine 75 mg BID  Pertinent Lab Values: Serum creatinine 3.02, BUN 44, Potassium 3.6, Sodium 133, BNP 98.3, Magnesium 2.0, A1c 10.1 (08/2018)   Vital Signs: Weight: 410 lbs (admission weight: 434 lbs) Blood pressure: 150-180/70s  Heart rate: 60s  I/O: -3.7L yesterday; net -14.9L  Medication Assistance / Insurance Benefits Check: Does the patient have prescription insurance?  Yes Type of insurance plan: [place] Medicaid  Outpatient Pharmacy:  Prior to admission outpatient pharmacy: [facility] Is the patient willing to use [facility] at discharge? Yes Is the patient willing to transition their outpatient pharmacy to utilize a [facility] outpatient pharmacy?   Pending    Assessment: 1. Acute on chronic diastolic CHF (LVEF 37-99%). NYHA class II symptoms. - Continue furosemide 40 mg PO daily - Continue metoprolol tartrate 75 mg BID - Agree with increasing to hydralazine 100 mg TID on discharge - No ACE/ARB/ARNI, MRA, or SGLT2i with advanced CKD. eGFR borderline for SGLT2i.  Plan: 1) Medication changes recommended at this time: - Stop amlodipine at follow up pending BP  2) Patient assistance: - None needed, has [place] Medicaid   Kerby Nora, PharmD, BCPS Heart Failure  Stewardship Pharmacist Phone [phone]

## 2022-04-13 NOTE — Anesthesia Preprocedure Evaluation (Addendum)
Anesthesia Evaluation  Patient identified by MRN, date of birth, ID band Patient awake    Reviewed: Allergy & Precautions, NPO status , Patient's Chart, lab work & pertinent test results  History of Anesthesia Complications Negative for: history of anesthetic complications  Airway Mallampati: III  TM Distance: >3 FB Neck ROM: Full    Dental  (+) Dental Advisory Given   Pulmonary sleep apnea ,    Pulmonary exam normal        Cardiovascular hypertension, Pt. on home beta blockers and Pt. on medications + CAD, + Peripheral Vascular Disease and + DVT  Normal cardiovascular exam     Neuro/Psych PSYCHIATRIC DISORDERS Anxiety Depression  Claustrophobia CVA, Residual Symptoms    GI/Hepatic Neg liver ROS, GERD  Medicated and Controlled,  Endo/Other  diabetes, Type 2, Insulin DependentMorbid obesity  Renal/GU CRFRenal disease     Musculoskeletal  (+) Arthritis , Fibromyalgia -  Abdominal (+) + obese,   Peds  Hematology  (+) Blood dyscrasia, anemia ,   Anesthesia Other Findings   Reproductive/Obstetrics                            Anesthesia Physical Anesthesia Plan  ASA: 3  Anesthesia Plan: MAC   Post-op Pain Management: Minimal or no pain anticipated   Induction:   PONV Risk Score and Plan: 1 and Propofol infusion and Treatment may vary due to age or medical condition  Airway Management Planned: Nasal Cannula and Natural Airway  Additional Equipment: None  Intra-op Plan:   Post-operative Plan:   Informed Consent: I have reviewed the patients History and Physical, chart, labs and discussed the procedure including the risks, benefits and alternatives for the proposed anesthesia with the patient or authorized representative who has indicated his/her understanding and acceptance.       Plan Discussed with: CRNA and Anesthesiologist  Anesthesia Plan Comments:        Anesthesia  Quick Evaluation

## 2022-04-14 ENCOUNTER — Ambulatory Visit (HOSPITAL_COMMUNITY): Payer: Medicaid Other | Admitting: Certified Registered Nurse Anesthetist

## 2022-04-14 ENCOUNTER — Ambulatory Visit (HOSPITAL_COMMUNITY)
Admission: RE | Admit: 2022-04-14 | Discharge: 2022-04-14 | Disposition: A | Discharge status: Home / Self Care | Payer: Medicaid Other | Attending: Internal Medicine | Admitting: Internal Medicine

## 2022-04-14 ENCOUNTER — Ambulatory Visit (HOSPITAL_BASED_OUTPATIENT_CLINIC_OR_DEPARTMENT_OTHER): Payer: Medicaid Other | Admitting: Certified Registered Nurse Anesthetist

## 2022-04-14 ENCOUNTER — Encounter (HOSPITAL_COMMUNITY): Payer: Self-pay | Admitting: Internal Medicine

## 2022-04-14 ENCOUNTER — Encounter (HOSPITAL_COMMUNITY)
Admission: RE | Disposition: A | Discharge status: Home / Self Care | Payer: Self-pay | Source: Home / Self Care | Attending: Internal Medicine

## 2022-04-14 ENCOUNTER — Other Ambulatory Visit: Payer: Self-pay

## 2022-04-14 DIAGNOSIS — Z794 Long term (current) use of insulin: Secondary | ICD-10-CM | POA: Diagnosis not present

## 2022-04-14 DIAGNOSIS — D649 Anemia, unspecified: Secondary | ICD-10-CM | POA: Diagnosis not present

## 2022-04-14 DIAGNOSIS — K209 Esophagitis, unspecified without bleeding: Secondary | ICD-10-CM

## 2022-04-14 DIAGNOSIS — M797 Fibromyalgia: Secondary | ICD-10-CM | POA: Insufficient documentation

## 2022-04-14 DIAGNOSIS — K5900 Constipation, unspecified: Secondary | ICD-10-CM

## 2022-04-14 DIAGNOSIS — K921 Melena: Secondary | ICD-10-CM | POA: Diagnosis present

## 2022-04-14 DIAGNOSIS — D123 Benign neoplasm of transverse colon: Secondary | ICD-10-CM | POA: Insufficient documentation

## 2022-04-14 DIAGNOSIS — Z6841 Body Mass Index (BMI) 40.0 and over, adult: Secondary | ICD-10-CM | POA: Diagnosis not present

## 2022-04-14 DIAGNOSIS — R1310 Dysphagia, unspecified: Secondary | ICD-10-CM | POA: Insufficient documentation

## 2022-04-14 DIAGNOSIS — K2950 Unspecified chronic gastritis without bleeding: Secondary | ICD-10-CM | POA: Diagnosis not present

## 2022-04-14 DIAGNOSIS — E1151 Type 2 diabetes mellitus with diabetic peripheral angiopathy without gangrene: Secondary | ICD-10-CM | POA: Diagnosis not present

## 2022-04-14 DIAGNOSIS — K2970 Gastritis, unspecified, without bleeding: Secondary | ICD-10-CM

## 2022-04-14 DIAGNOSIS — Z86718 Personal history of other venous thrombosis and embolism: Secondary | ICD-10-CM | POA: Insufficient documentation

## 2022-04-14 DIAGNOSIS — K2100 Gastro-esophageal reflux disease with esophagitis, without bleeding: Secondary | ICD-10-CM | POA: Insufficient documentation

## 2022-04-14 DIAGNOSIS — R1013 Epigastric pain: Secondary | ICD-10-CM | POA: Insufficient documentation

## 2022-04-14 DIAGNOSIS — K219 Gastro-esophageal reflux disease without esophagitis: Secondary | ICD-10-CM

## 2022-04-14 DIAGNOSIS — I2510 Atherosclerotic heart disease of native coronary artery without angina pectoris: Secondary | ICD-10-CM | POA: Insufficient documentation

## 2022-04-14 DIAGNOSIS — K648 Other hemorrhoids: Secondary | ICD-10-CM | POA: Diagnosis not present

## 2022-04-14 DIAGNOSIS — G4730 Sleep apnea, unspecified: Secondary | ICD-10-CM | POA: Insufficient documentation

## 2022-04-14 DIAGNOSIS — K635 Polyp of colon: Secondary | ICD-10-CM | POA: Diagnosis not present

## 2022-04-14 DIAGNOSIS — I129 Hypertensive chronic kidney disease with stage 1 through stage 4 chronic kidney disease, or unspecified chronic kidney disease: Secondary | ICD-10-CM | POA: Insufficient documentation

## 2022-04-14 DIAGNOSIS — Z8673 Personal history of transient ischemic attack (TIA), and cerebral infarction without residual deficits: Secondary | ICD-10-CM | POA: Insufficient documentation

## 2022-04-14 DIAGNOSIS — K625 Hemorrhage of anus and rectum: Secondary | ICD-10-CM

## 2022-04-14 DIAGNOSIS — N189 Chronic kidney disease, unspecified: Secondary | ICD-10-CM | POA: Insufficient documentation

## 2022-04-14 DIAGNOSIS — E1122 Type 2 diabetes mellitus with diabetic chronic kidney disease: Secondary | ICD-10-CM | POA: Insufficient documentation

## 2022-04-14 DIAGNOSIS — D120 Benign neoplasm of cecum: Secondary | ICD-10-CM | POA: Diagnosis not present

## 2022-04-14 HISTORY — PX: BIOPSY: SHX5522

## 2022-04-14 HISTORY — PX: POLYPECTOMY: SHX5525

## 2022-04-14 HISTORY — PX: ESOPHAGOGASTRODUODENOSCOPY (EGD) WITH PROPOFOL: SHX5813

## 2022-04-14 HISTORY — PX: COLONOSCOPY WITH PROPOFOL: SHX5780

## 2022-04-14 LAB — GLUCOSE, CAPILLARY
Glucose-Capillary: 241 mg/dL — ABNORMAL HIGH (ref 70–99)
Glucose-Capillary: 242 mg/dL — ABNORMAL HIGH (ref 70–99)
Glucose-Capillary: 245 mg/dL — ABNORMAL HIGH (ref 70–99)

## 2022-04-14 SURGERY — COLONOSCOPY WITH PROPOFOL
Anesthesia: Monitor Anesthesia Care

## 2022-04-14 MED ORDER — DEXMEDETOMIDINE (PRECEDEX) IN NS 20 MCG/5ML (4 MCG/ML) IV SYRINGE
PREFILLED_SYRINGE | INTRAVENOUS | Status: DC | PRN
  Administered 2022-04-14: 8 ug via INTRAVENOUS

## 2022-04-14 MED ORDER — INSULIN ASPART 100 UNIT/ML IJ SOLN
INTRAMUSCULAR | Status: AC
  Filled 2022-04-14: qty 1

## 2022-04-14 MED ORDER — LIDOCAINE 2% (20 MG/ML) 5 ML SYRINGE
INTRAMUSCULAR | Status: DC | PRN
  Administered 2022-04-14: 100 mg via INTRAVENOUS

## 2022-04-14 MED ORDER — OMEPRAZOLE 40 MG PO CPDR: 40.0000 mg | DELAYED_RELEASE_CAPSULE | Freq: Two times a day (BID) | ORAL | 2 refills | Status: DC

## 2022-04-14 MED ORDER — INSULIN ASPART 100 UNIT/ML IJ SOLN
6.0000 [IU] | Freq: Once | INTRAMUSCULAR | Status: AC
  Administered 2022-04-14: 6 [IU] via SUBCUTANEOUS

## 2022-04-14 MED ORDER — PROPOFOL 10 MG/ML IV BOLUS
INTRAVENOUS | Status: DC | PRN
  Administered 2022-04-14: 20 mg via INTRAVENOUS
  Administered 2022-04-14: 10 mg via INTRAVENOUS

## 2022-04-14 MED ORDER — GLYCOPYRROLATE PF 0.2 MG/ML IJ SOSY
PREFILLED_SYRINGE | INTRAMUSCULAR | Status: DC | PRN
  Administered 2022-04-14: .2 mg via INTRAVENOUS

## 2022-04-14 MED ORDER — SODIUM CHLORIDE 0.9 % IV SOLN: INTRAVENOUS | Status: DC

## 2022-04-14 MED ORDER — PROPOFOL 500 MG/50ML IV EMUL
INTRAVENOUS | Status: DC | PRN
  Administered 2022-04-14: 100 ug/kg/min via INTRAVENOUS

## 2022-04-14 MED ORDER — PROPOFOL 10 MG/ML IV BOLUS
INTRAVENOUS | Status: AC
  Filled 2022-04-14: qty 20

## 2022-04-14 MED ORDER — INSULIN ASPART 100 UNIT/ML IJ SOLN
INTRAMUSCULAR | Status: DC | PRN
  Administered 2022-04-14: 6 [IU] via SUBCUTANEOUS

## 2022-04-14 SURGICAL SUPPLY — 25 items

## 2022-04-14 NOTE — Op Note (Signed)
[facility] Patient Name: Kristopher Simon Procedure Date: 04/14/2022 MRN: [id number] Attending MD: Georgian Co ,  Date of Birth: 1965/09/03 CSN: [id number] Age: 56 Admit Type: Outpatient Procedure:                Colonoscopy Indications:              Rectal bleeding, Anemia Providers:                Adline Mango" Lynden Oxford, RN, Darliss Cheney, Technician Referring MD:             Sarah Martinique, MD Medicines:                Monitored Anesthesia Care Complications:            No immediate complications. Estimated Blood Loss:     Estimated blood loss was minimal. Procedure:                Pre-Anesthesia Assessment:                           - Prior to the procedure, a History and Physical                            was performed, and patient medications and                            allergies were reviewed. The patient's tolerance of                            previous anesthesia was also reviewed. The risks                            and benefits of the procedure and the sedation                            options and risks were discussed with the patient.                            All questions were answered, and informed consent                            was obtained. Prior Anticoagulants: The patient has                            taken no previous anticoagulant or antiplatelet                            agents. ASA Grade Assessment: III - A patient with                            severe systemic disease. After reviewing the risks  and benefits, the patient was deemed in                            satisfactory condition to undergo the procedure.                           After obtaining informed consent, the colonoscope                            was passed under direct vision. Throughout the                            procedure, the patient's blood pressure, pulse, and                             oxygen saturations were monitored continuously. The                            PCF-HQ190L ([id number]) Olympus colonoscope was                            introduced through the anus and advanced to the the                            terminal ileum. Scope In: 8:13:49 AM Scope Out: 8:42:41 AM Scope Withdrawal Time: 0 hours 22 minutes 30 seconds  Total Procedure Duration: 0 hours 28 minutes 52 seconds  Findings:      The terminal ileum appeared normal.      A 5 mm polyp was found in the cecum. The polyp was sessile. The polyp       was removed with a cold snare. Resection and retrieval were complete.      Five sessile polyps were found in the transverse colon. The polyps were       3 to 10 mm in size. These polyps were removed with a cold snare.       Resection and retrieval were complete.      Non-bleeding internal hemorrhoids were found during retroflexion. Impression:               - The examined portion of the ileum was normal.                           - One 5 mm polyp in the cecum, removed with a cold                            snare. Resected and retrieved.                           - Five 3 to 10 mm polyps in the transverse colon,                            removed with a cold snare. Resected and retrieved.                           - Non-bleeding  internal hemorrhoids. Moderate Sedation:      Not Applicable - Patient had care per Anesthesia. Recommendation:           - Discharge patient to home (with escort).                           - Await pathology results.                           - Return to GI clinic in 6 weeks.                           - The findings and recommendations were discussed                            with the patient. Procedure Code(s):        --- Professional ---                           [phone], Colonoscopy, flexible; with removal of                            tumor(s), polyp(s), or other lesion(s) by snare                            technique Diagnosis  Code(s):        --- Professional ---                           K64.8, Other hemorrhoids                           K63.5, Polyp of colon                           K62.5, Hemorrhage of anus and rectum                           D64.9, Anemia, unspecified CPT copyright 2019 American Medical Association. All rights reserved. The codes documented in this report are preliminary and upon coder review may  be revised to meet current compliance requirements. [address]Christia Reading,  04/14/2022 8:56:48 AM Number of Addenda: 0

## 2022-04-14 NOTE — Discharge Instructions (Signed)
YOU HAD AN ENDOSCOPIC PROCEDURE TODAY: Refer to the procedure report and other information in the discharge instructions given to you for any specific questions about what was found during the examination. If this information does not answer your questions, please call [place] office at [phone] to clarify.  ° °YOU SHOULD EXPECT: Some feelings of bloating in the abdomen. Passage of more gas than usual. Walking can help get rid of the air that was put into your GI tract during the procedure and reduce the bloating. If you had a lower endoscopy (such as a colonoscopy or flexible sigmoidoscopy) you may notice spotting of blood in your stool or on the toilet paper. Some abdominal soreness may be present for a day or two, also. ° °DIET: Your first meal following the procedure should be a light meal and then it is ok to progress to your normal diet. A half-sandwich or bowl of soup is an example of a good first meal. Heavy or fried foods are harder to digest and may make you feel nauseous or bloated. Drink plenty of fluids but you should avoid alcoholic beverages for 24 hours. If you had a esophageal dilation, please see attached instructions for diet.   ° °ACTIVITY: Your care partner should take you home directly after the procedure. You should plan to take it easy, moving slowly for the rest of the day. You can resume normal activity the day after the procedure however YOU SHOULD NOT DRIVE, use power tools, machinery or perform tasks that involve climbing or major physical exertion for 24 hours (because of the sedation medicines used during the test).  ° °SYMPTOMS TO REPORT IMMEDIATELY: °A gastroenterologist can be reached at any hour. Please call [phone]  for any of the following symptoms:  °Following lower endoscopy (colonoscopy, flexible sigmoidoscopy) °Excessive amounts of blood in the stool  °Significant tenderness, worsening of abdominal pains  °Swelling of the abdomen that is new, acute  °Fever of 100° or  higher  °Following upper endoscopy (EGD, EUS, ERCP, esophageal dilation) °Vomiting of blood or coffee ground material  °New, significant abdominal pain  °New, significant chest pain or pain under the shoulder blades  °Painful or persistently difficult swallowing  °New shortness of breath  °Black, tarry-looking or red, bloody stools ° °FOLLOW UP:  °If any biopsies were taken you will be contacted by phone or by letter within the next 1-3 weeks. Call [phone]  if you have not heard about the biopsies in 3 weeks.  °Please also call with any specific questions about appointments or follow up tests. ° °

## 2022-04-14 NOTE — Transfer of Care (Signed)
Immediate Anesthesia Transfer of Care Note  Patient: Kristopher Simon  Procedure(s) Performed: COLONOSCOPY WITH PROPOFOL ESOPHAGOGASTRODUODENOSCOPY (EGD) WITH PROPOFOL BIOPSY POLYPECTOMY  Patient Location: PACU and Endoscopy Unit  Anesthesia Type:MAC  Level of Consciousness: awake, alert  and patient cooperative  Airway & Oxygen Therapy: Patient Spontanous Breathing and Patient connected to face mask oxygen  Post-op Assessment: Report given to RN and Post -op Vital signs reviewed and stable  Post vital signs: Reviewed and stable  Last Vitals:  Vitals Value Taken Time  BP 184/61 04/14/22 0910  Temp 35.9 C 04/14/22 0853  Pulse 71 04/14/22 0913  Resp 12 04/14/22 0913  SpO2 97 % 04/14/22 0913  Vitals shown include unvalidated device data.  Last Pain:  Vitals:   04/14/22 0903  TempSrc:   PainSc: 0-No pain         Complications: No notable events documented.

## 2022-04-14 NOTE — H&P (Signed)
GASTROENTEROLOGY PROCEDURE H&P NOTE   Primary Care Physician: Martinique, Sarah T, MD    Reason for Procedure:   Rectal bleeding, possible melena, epigastric ab pain, dysphagia, GERD, anemia  Plan:    EGD/colonoscopy  Patient is appropriate for endoscopic procedure(s) in the hospital setting.  The nature of the procedure, as well as the risks, benefits, and alternatives were carefully and thoroughly reviewed with the patient. Ample time for discussion and questions allowed. The patient understood, was satisfied, and agreed to proceed.     HPI: Kristopher Simon is a 56 y.o. male who presents for EGD/colonoscopy for evaluation of rectal bleeding, possible melena, epigastric ab pain, dysphagia, GERD, and anemia .  Patient was most recently seen in the Gastroenterology Clinic on 02/02/22.  No interval change in medical history since that appointment. Please refer to that note for full details regarding GI history and clinical presentation.   Past Medical History:  Diagnosis Date   Acute renal failure ([place]) 01/2012   CKD   Anxiety    Arthritis    hands   Cellulitis    hx left leg    Cellulitis of left lower extremity 07/06/2014   Claustrophobia    Concussion    Coronary artery disease    Dehiscence of amputation stump (H[facility])    Depression    Diabetes mellitus 1995   Type II   DVT (deep venous thrombosis) ([place]) 2014   left leg   Fibromyalgia    Foot abscess, left 09/12/2018   GERD (gastroesophageal reflux disease)    HLD (hyperlipidemia)    Hypertension 1998   Neuropathy    feet- below knee   Panic attacks    Peripheral vascular disease (H[facility])    left leg, right neck   PTSD (post-traumatic stress disorder)    Sleep apnea    Stroke ([place])    x 3  memory loss - left hand- and left lower extremity-shakes at times- closes by it self- 06/2017- last one   Wears glasses    blind in left eye, readers    Past Surgical History:  Procedure Laterality Date   AMPUTATION Left  09/14/2018   Procedure: LEFT BELOW KNEE AMPUTATION;  Surgeon: Newt Minion, MD;  Location: [place];  Service: Orthopedics;  Laterality: Left;   CARDIAC CATHETERIZATION  2009   stent   COLONOSCOPY     HIP FRACTURE SURGERY Right 2014   metal in hip   KNEE ARTHROSCOPY Right    RETINAL CRYOABLATION Left    repair torn retina - blind in left eye   STUMP REVISION Left 10/19/2018   STUMP REVISION Left 10/19/2018   Procedure: REVISION LEFT BELOW KNEE AMPUTATION;  Surgeon: Newt Minion, MD;  Location: [place];  Service: Orthopedics;  Laterality: Left;   STUMP REVISION Left 11/30/2018   Procedure: REVISION LEFT BELOW KNEE AMPUTATION;  Surgeon: Newt Minion, MD;  Location: [place];  Service: Orthopedics;  Laterality: Left;  REVISION LEFT BELOW KNEE AMPUTATION   STUMP REVISION Left 12/07/2018   Procedure: REVISION LEFT BELOW KNEE AMPUTATION;  Surgeon: Newt Minion, MD;  Location: [place];  Service: Orthopedics;  Laterality: Left;   TONSILLECTOMY     WISDOM TOOTH EXTRACTION      Prior to Admission medications   Medication Sig Start Date End Date Taking? Authorizing Provider  amLODipine (NORVASC) 10 MG tablet Take 10 mg by mouth at bedtime. 02/09/22  Yes [provider]  aspirin EC 81 MG tablet Take 1  tablet (81 mg total) by mouth daily. 09/26/18  Yes Love, Ivan Anchors, PA-C  atorvastatin (LIPITOR) 40 MG tablet Take 40 mg by mouth daily. 10/14/21  Yes [provider]  citalopram (CELEXA) 40 MG tablet Take 1 tablet (40 mg total) by mouth daily. 04/20/21  Yes [name], MD  cyclobenzaprine (FLEXERIL) 5 MG tablet Take 1 tablet (5 mg total) by mouth 3 (three) times daily as needed for muscle spasms. 09/26/18  Yes Love, Ivan Anchors, PA-C  furosemide (LASIX) 40 MG tablet Take 1 tablet (40 mg total) by mouth daily and take an extra dose for weight gain of 2-3 pounds overnight or 5 pounds in 1 week 04/08/22  Yes Domenic Polite, MD  gabapentin (NEURONTIN) 300 MG capsule Take 1 capsule (300 mg total)  by mouth 2 (two) times daily. 04/08/22  Yes Domenic Polite, MD  gemfibrozil (LOPID) 600 MG tablet Take 1 tablet (600 mg total) by mouth 2 (two) times daily before a meal. 09/26/18  Yes Love, Ivan Anchors, PA-C  hydrALAZINE (APRESOLINE) 100 MG tablet Take 1 tablet (100 mg total) by mouth 3 (three) times daily. 04/08/22  Yes Domenic Polite, MD  insulin regular human CONCENTRATED (HUMULIN R U-500 KWIKPEN) 500 UNIT/ML kwikpen Inject 140-150 Units into the skin See admin instructions. 150 units with breakfast and 140 units with supper 04/23/19  Yes [provider]  metoprolol tartrate 75 MG TABS Take 75 mg by mouth 2 (two) times daily. Patient taking differently: Take 100 mg by mouth 2 (two) times daily. 09/26/18  Yes Love, Ivan Anchors, PA-C  MOUNJARO 7.5 MG/0.5ML Pen Inject 7.5 mg into the skin once a week. 01/31/22  Yes [provider]  omeprazole (PRILOSEC) 40 MG capsule Take 1 capsule (40 mg total) by mouth daily. 02/02/22  Yes Sharyn Creamer, MD  traZODone (DESYREL) 50 MG tablet Take 0.5-1 tablets (25-50 mg total) by mouth at bedtime as needed for sleep. Patient taking differently: Take 50 mg by mouth at bedtime as needed for sleep. 09/26/18  Yes Love, Ivan Anchors, PA-C  nitroGLYCERIN (NITROSTAT) 0.4 MG SL tablet Place under the tongue. 03/26/20   [provider]  tadalafil (CIALIS) 10 MG tablet Take 10 mg by mouth daily as needed. Patient not taking: Reported on 04/04/2022 03/28/22   [provider]    Current Facility-Administered Medications  Medication Dose Route Frequency Provider Last Rate Last Admin   0.9 %  sodium chloride infusion   Intravenous Continuous Sharyn Creamer, MD 20 mL/hr at 04/14/22 0732 New Bag at 04/14/22 0732    Allergies as of 02/02/2022 - Review Complete 02/02/2022  Allergen Reaction Noted   Bactrim [sulfamethoxazole-trimethoprim] Anaphylaxis and Swelling 04/25/2013   Lyrica [pregabalin] Swelling 04/25/2013   Liraglutide Nausea Only 01/21/2020   Magnesium  Swelling 12/04/2019    Family History  Problem Relation Age of Onset   Hypertension Mother    Colon polyps Mother    Dementia Father    Alzheimer's disease Father    Heart disease Father    Thyroid disease Sister    Obesity Sister    Colon polyps Brother    Diabetes Maternal Aunt        several aunts and uncles   Diabetes Maternal Uncle    Heart disease Maternal Uncle    Heart disease Maternal Uncle     Social History   Socioeconomic History   Marital status: Divorced    Spouse name: Not on file   Number of children: 0  Years of education: Not on file   Highest education level: High school graduate  Occupational History   Occupation: Disabled  Tobacco Use   Smoking status: Never   Smokeless tobacco: Never  Vaping Use   Vaping Use: Never used  Substance and Sexual Activity   Alcohol use: Yes    Alcohol/week: 1.0 standard drink of alcohol    Types: 1 Glasses of wine per week    Comment: occasional -social   Drug use: No   Sexual activity: Yes    Birth control/protection: None  Other Topics Concern   Not on file  Social History Narrative   Not on file   Social Determinants of Health   Financial Resource Strain: Low Risk  (04/07/2022)   Overall Financial Resource Strain (CARDIA)    Difficulty of Paying Living Expenses: Not hard at all  Food Insecurity: No Food Insecurity (04/07/2022)   Hunger Vital Sign    Worried About Running Out of Food in the Last Year: Never true    Ran Out of Food in the Last Year: Never true  Transportation Needs: No Transportation Needs (04/07/2022)   PRAPARE - Hydrologist (Medical): No    Lack of Transportation (Non-Medical): No  Physical Activity: Not on file  Stress: Not on file  Social Connections: Not on file  Intimate Partner Violence: Not on file    Physical Exam: Vital signs in last 24 hours: BP (!) 202/62   Pulse 64   Temp 98.7 F (37.1 C) (Temporal)   Resp 11   Ht '6\' 5"'$  (1.956 m)   Wt  (!) 190.5 kg   SpO2 95%   BMI 49.80 kg/m  GEN: NAD EYE: Sclerae anicteric ENT: MMM CV: Non-tachycardic Pulm: No increased WOB GI: Soft NEURO:  Alert & Oriented   Christia Reading, MD [facility]   04/14/2022 7:44 AM

## 2022-04-14 NOTE — Op Note (Signed)
[facility] Patient Name: Kristopher Simon Procedure Date: 04/14/2022 MRN: [id number] Attending MD: Georgian Co ,  Date of Birth: 30-Nov-1965 CSN: [id number] Age: 56 Admit Type: Outpatient Procedure:                Upper GI endoscopy Indications:              Epigastric abdominal pain, Dysphagia, Anemia Providers:                Adline Mango" Lynden Oxford, RN, Darliss Cheney, Technician Referring MD:             Sarah Martinique, MD Medicines:                Monitored Anesthesia Care Complications:            No immediate complications. Estimated Blood Loss:     Estimated blood loss was minimal. Procedure:                Pre-Anesthesia Assessment:                           - Prior to the procedure, a History and Physical                            was performed, and patient medications and                            allergies were reviewed. The patient's tolerance of                            previous anesthesia was also reviewed. The risks                            and benefits of the procedure and the sedation                            options and risks were discussed with the patient.                            All questions were answered, and informed consent                            was obtained. Prior Anticoagulants: The patient has                            taken no previous anticoagulant or antiplatelet                            agents. ASA Grade Assessment: III - A patient with                            severe systemic disease. After reviewing the risks  and benefits, the patient was deemed in                            satisfactory condition to undergo the procedure.                           After obtaining informed consent, the endoscope was                            passed under direct vision. Throughout the                            procedure, the patient's blood pressure, pulse, and                             oxygen saturations were monitored continuously. The                            [id number]-[id number] ([id number]) Olympus endoscope was introduced                            through the mouth, and advanced to the second part                            of duodenum. The upper GI endoscopy was                            accomplished without difficulty. The patient                            tolerated the procedure well. Scope In: Scope Out: Findings:      LA Grade A (one or more mucosal breaks less than 5 mm, not extending       between tops of 2 mucosal folds) esophagitis with no bleeding was found       in the distal esophagus.      Localized inflammation characterized by congestion (edema) and erythema       was found in the gastric antrum. Biopsies were taken with a cold forceps       for histology.      The examined duodenum was normal. Biopsies were taken with a cold       forceps for histology. Impression:               - LA Grade A esophagitis with no bleeding.                           - Gastritis. Biopsied.                           - Normal examined duodenum. Biopsied. Moderate Sedation:      Not Applicable - Patient had care per Anesthesia. Recommendation:           - Use Prilosec (omeprazole) 40 mg PO BID for 8 days.                           -  Await pathology results.                           - Perform a colonoscopy today. Procedure Code(s):        --- Professional ---                           [phone], Esophagogastroduodenoscopy, flexible,                            transoral; with biopsy, single or multiple Diagnosis Code(s):        --- Professional ---                           K20.90, Esophagitis, unspecified without bleeding                           K29.70, Gastritis, unspecified, without bleeding                           R10.13, Epigastric pain                           R13.10, Dysphagia, unspecified                           D64.9, Anemia, unspecified CPT  copyright 2019 American Medical Association. All rights reserved. The codes documented in this report are preliminary and upon coder review may  be revised to meet current compliance requirements. Sonny Masters "Christia Reading,  04/14/2022 8:52:14 AM Number of Addenda: 0

## 2022-04-14 NOTE — Anesthesia Procedure Notes (Addendum)
Procedure Name: MAC Date/Time: 04/14/2022 7:48 AM  Performed by: West Pugh, CRNAPre-anesthesia Checklist: Patient identified, Emergency Drugs available, Suction available, Patient being monitored and Timeout performed Patient Re-evaluated:Patient Re-evaluated prior to induction Oxygen Delivery Method: Simple face mask Preoxygenation: Pre-oxygenation with 100% oxygen Induction Type: IV induction Placement Confirmation: positive ETCO2 Dental Injury: Teeth and Oropharynx as per pre-operative assessment

## 2022-04-14 NOTE — Anesthesia Postprocedure Evaluation (Signed)
Anesthesia Post Note  Patient: Kristopher Simon  Procedure(s) Performed: COLONOSCOPY WITH PROPOFOL ESOPHAGOGASTRODUODENOSCOPY (EGD) WITH PROPOFOL BIOPSY POLYPECTOMY     Patient location during evaluation: PACU Anesthesia Type: MAC Level of consciousness: awake and alert Pain management: pain level controlled Vital Signs Assessment: post-procedure vital signs reviewed and stable Respiratory status: spontaneous breathing, nonlabored ventilation and respiratory function stable Cardiovascular status: stable and blood pressure returned to baseline Anesthetic complications: no   No notable events documented.  Last Vitals:  Vitals:   04/14/22 0913 04/14/22 0923  BP: (!) 184/61 (!) 194/63  Pulse: 71 70  Resp: 12 15  Temp:    SpO2: 97% 95%    Last Pain:  Vitals:   04/14/22 0923  TempSrc:   PainSc: 0-No pain                 Audry Pili

## 2022-04-17 ENCOUNTER — Encounter (HOSPITAL_COMMUNITY): Payer: Self-pay | Admitting: Internal Medicine

## 2022-04-18 ENCOUNTER — Telehealth (HOSPITAL_COMMUNITY): Payer: Self-pay

## 2022-04-18 ENCOUNTER — Encounter (HOSPITAL_COMMUNITY): Payer: Medicaid Other

## 2022-04-18 NOTE — Telephone Encounter (Signed)
Call attempted to confirm HV TOC appt today at 2pm. HIPPA appropriate VM left with callback number.    Pricilla Holm, MSN, RN

## 2022-04-19 LAB — SURGICAL PATHOLOGY

## 2022-04-19 NOTE — Progress Notes (Signed)
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 Recommend bismuth quadruple therapy for treatment: - Tetracycline 500 mg BID x 14 days - Flagyl 250 mg QID x 14 days - Bismuth subsalicylate 241 mg QID x 14 days - PPI BID x 14 days  About 4 weeks after completion of therapy, patient will need to be checked for eradication with a stool H pylori antigen. PPI therapy will need to be held 2 weeks prior to checking for eradication.  Please also place a recall for a colonoscopy in 3 years for polyp surveillance. Thanks.

## 2022-04-27 ENCOUNTER — Telehealth (HOSPITAL_COMMUNITY): Payer: Self-pay | Admitting: *Deleted

## 2022-04-27 ENCOUNTER — Encounter (HOSPITAL_COMMUNITY): Payer: Medicaid Other

## 2022-04-27 NOTE — Telephone Encounter (Signed)
Call attempted to confirm HV TOC appt 3 pm on 04/27/22. HIPPA appropriate VM left with callback number.    Earnestine Leys, BSN, Clinical cytogeneticist Only

## 2022-04-28 ENCOUNTER — Telehealth: Payer: Self-pay | Admitting: Pharmacy Technician

## 2022-04-28 ENCOUNTER — Other Ambulatory Visit: Payer: Self-pay

## 2022-04-28 MED ORDER — TETRACYCLINE HCL 500 MG PO CAPS: 500.0000 mg | ORAL_CAPSULE | Freq: Two times a day (BID) | ORAL | 0 refills | Status: AC

## 2022-04-28 MED ORDER — METRONIDAZOLE 250 MG PO TABS: 250.0000 mg | ORAL_TABLET | Freq: Four times a day (QID) | ORAL | 0 refills | Status: AC

## 2022-04-28 NOTE — Telephone Encounter (Signed)
Patient Advocate Encounter  Received notification that prior authorization for TETRACYCLINE '500MG'$  is required.   PA submitted on 9.7.23 Key [id number] Status is pending    Luciano Cutter, CPhT Patient Advocate Phone: [phone]

## 2022-04-29 ENCOUNTER — Ambulatory Visit (HOSPITAL_COMMUNITY)
Admission: RE | Admit: 2022-04-29 | Discharge: 2022-04-29 | Disposition: A | Discharge status: Home / Self Care | Payer: Medicaid Other | Source: Ambulatory Visit | Attending: Adult Health | Admitting: Adult Health

## 2022-04-29 ENCOUNTER — Telehealth (HOSPITAL_COMMUNITY): Payer: Self-pay | Admitting: *Deleted

## 2022-04-29 VITALS — BP 142/80 | HR 75 | Wt >= 6400 oz

## 2022-04-29 DIAGNOSIS — N184 Chronic kidney disease, stage 4 (severe): Secondary | ICD-10-CM | POA: Insufficient documentation

## 2022-04-29 DIAGNOSIS — Z79899 Other long term (current) drug therapy: Secondary | ICD-10-CM | POA: Diagnosis not present

## 2022-04-29 DIAGNOSIS — I2510 Atherosclerotic heart disease of native coronary artery without angina pectoris: Secondary | ICD-10-CM | POA: Insufficient documentation

## 2022-04-29 DIAGNOSIS — Z6841 Body Mass Index (BMI) 40.0 and over, adult: Secondary | ICD-10-CM | POA: Diagnosis not present

## 2022-04-29 DIAGNOSIS — I5033 Acute on chronic diastolic (congestive) heart failure: Secondary | ICD-10-CM | POA: Diagnosis not present

## 2022-04-29 DIAGNOSIS — E1151 Type 2 diabetes mellitus with diabetic peripheral angiopathy without gangrene: Secondary | ICD-10-CM | POA: Diagnosis not present

## 2022-04-29 DIAGNOSIS — E669 Obesity, unspecified: Secondary | ICD-10-CM | POA: Diagnosis not present

## 2022-04-29 DIAGNOSIS — I130 Hypertensive heart and chronic kidney disease with heart failure and stage 1 through stage 4 chronic kidney disease, or unspecified chronic kidney disease: Secondary | ICD-10-CM | POA: Insufficient documentation

## 2022-04-29 DIAGNOSIS — Z951 Presence of aortocoronary bypass graft: Secondary | ICD-10-CM | POA: Diagnosis not present

## 2022-04-29 DIAGNOSIS — Z89512 Acquired absence of left leg below knee: Secondary | ICD-10-CM | POA: Diagnosis not present

## 2022-04-29 DIAGNOSIS — R0602 Shortness of breath: Secondary | ICD-10-CM | POA: Diagnosis not present

## 2022-04-29 DIAGNOSIS — Z794 Long term (current) use of insulin: Secondary | ICD-10-CM | POA: Insufficient documentation

## 2022-04-29 DIAGNOSIS — I5032 Chronic diastolic (congestive) heart failure: Secondary | ICD-10-CM | POA: Insufficient documentation

## 2022-04-29 DIAGNOSIS — E1122 Type 2 diabetes mellitus with diabetic chronic kidney disease: Secondary | ICD-10-CM | POA: Insufficient documentation

## 2022-04-29 LAB — BASIC METABOLIC PANEL
Anion gap: 9 (ref 5–15)
BUN: 32 mg/dL — ABNORMAL HIGH (ref 6–20)
CO2: 24 mmol/L (ref 22–32)
Calcium: 8.4 mg/dL — ABNORMAL LOW (ref 8.9–10.3)
Chloride: 106 mmol/L (ref 98–111)
Creatinine, Ser: 2.95 mg/dL — ABNORMAL HIGH (ref 0.61–1.24)
GFR, Estimated: 24 mL/min — ABNORMAL LOW (ref >60)
Glucose, Bld: 98 mg/dL (ref 70–99)
Potassium: 4.2 mmol/L (ref 3.5–5.1)
Sodium: 139 mmol/L (ref 135–145)

## 2022-04-29 NOTE — Telephone Encounter (Signed)
Called to confirm Heart & Vascular Transitions of Care appointment at 11 am on 04/29/22. Patient reminded to bring all medications and pill box organizer with them. Confirmed patient has transportation. Gave directions, instructed to utilize [place] parking.  Confirmed appointment prior to ending call.    Earnestine Leys, BSN, Clinical cytogeneticist Only

## 2022-04-29 NOTE — Progress Notes (Signed)
HEART & VASCULAR TRANSITION OF CARE CONSULT NOTE     Referring Physician:Dr Broadus John Primary Care: Dr Sarah Martinique Primary Cardiologist: No e Nephrology: Dr Neta Ehlers  HPI: Referred to clinic by Dr Broadus John for heart failure consultation.   Kristopher Simon is a 56 year old with a history of HFpEF, HTN, CKD Stage IV, CAD, S/P CABG, L BKA, obesity, and DM.   Admitted 04/04/22 with increased shortness of breath in the setting of A/C HFpEF. Diuresed with IV lasix and transitioned to lasix 40 mg po daily. D/C weight 410 pounds. He was not weighed with his prosthetic.   Overall feeling fine. Remains SOB with exertion but feels much better. Denies PND/Orthopnea. Able to walk outside of his house. Appetite ok. Eats [place] food at least once a week. About 4 months ago he started drinking 8-10 bottles of Gartorade a day.  No fever or chills. He does not have a scale. Taking mounjaro. Taking all medications. Lives with his Mom. He is disability and has food stamps.   Cardiac Testing  Echo 03/2022 EF 60-65% RV not well visualized.    Review of Systems: [y] = yes, '[ ]'$  = no   General: Weight gain '[ ]'$ ; Weight loss '[ ]'$ ; Anorexia '[ ]'$ ; Fatigue '[ ]'$ ; Fever '[ ]'$ ; Chills '[ ]'$ ; Weakness '[ ]'$   Cardiac: Chest pain/pressure '[ ]'$ ; Resting SOB '[ ]'$ ; Exertional SOB [ Y]; Orthopnea '[ ]'$ ; Pedal Edema '[ ]'$ ; Palpitations '[ ]'$ ; Syncope '[ ]'$ ; Presyncope '[ ]'$ ; Paroxysmal nocturnal dyspnea'[ ]'$   Pulmonary: Cough '[ ]'$ ; Wheezing'[ ]'$ ; Hemoptysis'[ ]'$ ; Sputum '[ ]'$ ; Snoring '[ ]'$   GI: Vomiting'[ ]'$ ; Dysphagia'[ ]'$ ; Melena'[ ]'$ ; Hematochezia '[ ]'$ ; Heartburn'[ ]'$ ; Abdominal pain '[ ]'$ ; Constipation '[ ]'$ ; Diarrhea '[ ]'$ ; BRBPR '[ ]'$   GU: Hematuria'[ ]'$ ; Dysuria '[ ]'$ ; Nocturia'[ ]'$   Vascular: Pain in legs with walking '[ ]'$ ; Pain in feet with lying flat '[ ]'$ ; Non-healing sores '[ ]'$ ; Stroke '[ ]'$ ; TIA '[ ]'$ ; Slurred speech '[ ]'$ ;  Neuro: Headaches'[ ]'$ ; Vertigo'[ ]'$ ; Seizures'[ ]'$ ; Paresthesias'[ ]'$ ;Blurred vision '[ ]'$ ; Diplopia '[ ]'$ ; Vision changes '[ ]'$   Ortho/Skin: Arthritis '[ ]'$ ; Joint pain [Y ];  Muscle pain '[ ]'$ ; Joint swelling '[ ]'$ ; Back Pain [ Y]; Rash '[ ]'$   Psych: Depression'[ ]'$ ; Anxiety'[ ]'$   Heme: Bleeding problems '[ ]'$ ; Clotting disorders '[ ]'$ ; Anemia '[ ]'$   Endocrine: Diabetes [Y ]; Thyroid dysfunction'[ ]'$    Past Medical History:  Diagnosis Date   Acute renal failure ([facility]) 01/2012   CKD   Anxiety    Arthritis    hands   Cellulitis    hx left leg    Cellulitis of left lower extremity 07/06/2014   Claustrophobia    Concussion    Coronary artery disease    Dehiscence of amputation stump (HCC)    Depression    Diabetes mellitus 1995   Type II   DVT (deep venous thrombosis) ([place]) 2014   left leg   Fibromyalgia    Foot abscess, left 09/12/2018   GERD (gastroesophageal reflux disease)    HLD (hyperlipidemia)    Hypertension 1998   Neuropathy    feet- below knee   Panic attacks    Peripheral vascular disease (HCC)    left leg, right neck   PTSD (post-traumatic stress disorder)    Sleep apnea    Stroke (HCC)    x 3  memory loss - left hand- and left lower extremity-shakes at times- closes by it self-  06/2017- last one   Wears glasses    blind in left eye, readers    Current Outpatient Medications  Medication Sig Dispense Refill   amLODipine (NORVASC) 10 MG tablet Take 10 mg by mouth at bedtime.     aspirin EC 81 MG tablet Take 1 tablet (81 mg total) by mouth daily. 30 tablet 0   atorvastatin (LIPITOR) 40 MG tablet Take 40 mg by mouth daily.     citalopram (CELEXA) 40 MG tablet Take 1 tablet (40 mg total) by mouth daily. 90 tablet 0   cyclobenzaprine (FLEXERIL) 5 MG tablet Take 1 tablet (5 mg total) by mouth 3 (three) times daily as needed for muscle spasms. 45 tablet 0   furosemide (LASIX) 40 MG tablet Take 1 tablet (40 mg total) by mouth daily and take an extra dose for weight gain of 2-3 pounds overnight or 5 pounds in 1 week 30 tablet 0   gabapentin (NEURONTIN) 300 MG capsule Take 1 capsule (300 mg total) by mouth 2 (two) times daily.  0   gemfibrozil (LOPID) 600 MG  tablet Take 1 tablet (600 mg total) by mouth 2 (two) times daily before a meal.     hydrALAZINE (APRESOLINE) 100 MG tablet Take 1 tablet (100 mg total) by mouth 3 (three) times daily. 90 tablet 0   insulin regular human CONCENTRATED (HUMULIN R U-500 KWIKPEN) 500 UNIT/ML kwikpen Inject 140-150 Units into the skin See admin instructions. 150 units with breakfast and 140 units with supper     metoprolol tartrate 75 MG TABS Take 75 mg by mouth 2 (two) times daily. (Patient taking differently: Take 100 mg by mouth 2 (two) times daily.) 60 tablet 0   metroNIDAZOLE (FLAGYL) 250 MG tablet Take 1 tablet (250 mg total) by mouth 4 (four) times daily for 10 days. 40 tablet 0   MOUNJARO 7.5 MG/0.5ML Pen Inject 7.5 mg into the skin once a week.     nitroGLYCERIN (NITROSTAT) 0.4 MG SL tablet Place under the tongue.     omeprazole (PRILOSEC) 40 MG capsule Take 1 capsule (40 mg total) by mouth 2 (two) times daily before a meal. 60 capsule 2   tadalafil (CIALIS) 10 MG tablet Take 10 mg by mouth daily as needed.     tetracycline (SUMYCIN) 500 MG capsule Take 1 capsule (500 mg total) by mouth 2 (two) times daily for 14 days. 28 capsule 0   traZODone (DESYREL) 50 MG tablet Take 0.5-1 tablets (25-50 mg total) by mouth at bedtime as needed for sleep. (Patient taking differently: Take 50 mg by mouth at bedtime as needed for sleep.) 15 tablet 0   No current facility-administered medications for this encounter.    Allergies  Allergen Reactions   Bactrim [Sulfamethoxazole-Trimethoprim] Anaphylaxis and Swelling     (BACTRIM) tongue swells   Lisinopril Other (See Comments)    Doctor took Pt off medication due to renal failure   Lyrica [Pregabalin] Swelling   Liraglutide Nausea Only   Magnesium Swelling   Vancomycin Other (See Comments)    Causes renal failure      Social History   Socioeconomic History   Marital status: Divorced    Spouse name: Not on file   Number of children: 0   Years of education: Not on  file   Highest education level: High school graduate  Occupational History   Occupation: Disabled  Tobacco Use   Smoking status: Never   Smokeless tobacco: Never  Vaping Use   Vaping Use:  Never used  Substance and Sexual Activity   Alcohol use: Yes    Alcohol/week: 1.0 standard drink of alcohol    Types: 1 Glasses of wine per week    Comment: occasional -social   Drug use: No   Sexual activity: Yes    Birth control/protection: None  Other Topics Concern   Not on file  Social History Narrative   Not on file   Social Determinants of Health   Financial Resource Strain: Low Risk  (04/07/2022)   Overall Financial Resource Strain (CARDIA)    Difficulty of Paying Living Expenses: Not hard at all  Food Insecurity: No Food Insecurity (04/07/2022)   Hunger Vital Sign    Worried About Running Out of Food in the Last Year: Never true    Ran Out of Food in the Last Year: Never true  Transportation Needs: No Transportation Needs (04/07/2022)   PRAPARE - Hydrologist (Medical): No    Lack of Transportation (Non-Medical): No  Physical Activity: Not on file  Stress: Not on file  Social Connections: Not on file  Intimate Partner Violence: Not on file      Family History  Problem Relation Age of Onset   Hypertension Mother    Colon polyps Mother    Dementia Father    Alzheimer's disease Father    Heart disease Father    Thyroid disease Sister    Obesity Sister    Colon polyps Brother    Diabetes Maternal Aunt        several aunts and uncles   Diabetes Maternal Uncle    Heart disease Maternal Uncle    Heart disease Maternal Uncle     Vitals:   04/29/22 1023  BP: (!) 142/80  Pulse: 75  SpO2: 97%  Weight: (!) 197.8 kg (436 lb)    PHYSICAL EXAM: General:  Walked in the clinic. No respiratory difficulty HEENT: normal Neck: supple. no JVD. Carotids 2+ bilat; no bruits. No lymphadenopathy or thryomegaly appreciated. Cor: PMI nondisplaced. Regular  rate & rhythm. No rubs, gallops or murmurs. Lungs: clear Abdomen: obese, soft, nontender, nondistended. No hepatosplenomegaly. No bruits or masses. Good bowel sounds. Extremities: no cyanosis, clubbing, rash, edema. L BKA. Wear sprosthetic.  Neuro: alert & oriented x 3, cranial nerves grossly intact. moves all 4 extremities w/o difficulty. Affect pleasant.  ECG: SR 76 bpm    ASSESSMENT & PLAN: 1. HFpEF  03/2022 Echo Ef 60-65%. RV not well visualized.  NYHA II GDMT . On exam he does not appear volume overloaded.  Diuretic-Continue lasix 40 mg po daily with extra 40 mg lasix as needed.  BB- Continue current regime.  Ace/ARB/ARNI- Avoid CKD Stge IV MRA- Avoid CKD Stage IV SGLT2i- Avoid due to possible skin irritation due to pannus.   - Check BMET - Discussed low salt food choices and to stop drinking Gatorade. Suspect diet/fluids played a role in readmit.   2. Obesity  -Body mass index is 51.7 kg/m. - On Mounjaro -Discussed portion control   3. DMII Insulin dependent  Hgb A1C 8.4   4. CKD Stage IV -Creatinine baseline ~2.7-2.9 GFR 23 -Avoid NSAIDs.  - Asked him to make a follow up.   Referred to HFSW (PCP, Medications, Transportation, ETOH Abuse, Drug Abuse, Insurance, Financial ): No Refer to Pharmacy: No Refer to Home Health: No Refer to Advanced Heart Failure Clinic: NO  Refer to General Cardiology: No  Follow up as needed   Kristopher Dorgan NP-C  11:51 AM

## 2022-04-29 NOTE — Telephone Encounter (Signed)
Patient Advocate Encounter  Prior Authorization for TETRACYCLINE '500MG'$  has been approved.    PA# [id number] Effective dates: 9.7.23 through 9.6.24  Sherwin Hollingshed B. CPhT P: [phone] F: [phone]

## 2022-04-29 NOTE — Patient Instructions (Signed)
Take an extra '40mg'$  of lasix as needed  Labs done today, your results will be available in MyChart, we will contact you for abnormal readings.  DO NOT DRINK ANY GATORADE AT ALL.  Thank you for allowing [place] to provider your heart failure care after your recent hospitalization. Please follow-up with pcp and your kidney doctor asap.  If you have any questions, issues, or concerns before your next appointment please call our office at [phone], opt. 2 and leave a message for the triage nurse.

## 2022-05-27 ENCOUNTER — Other Ambulatory Visit: Payer: Self-pay

## 2022-05-27 DIAGNOSIS — A048 Other specified bacterial intestinal infections: Secondary | ICD-10-CM

## 2022-06-21 DIAGNOSIS — R410 Disorientation, unspecified: Secondary | ICD-10-CM | POA: Insufficient documentation

## 2022-06-23 DIAGNOSIS — I6523 Occlusion and stenosis of bilateral carotid arteries: Secondary | ICD-10-CM | POA: Insufficient documentation

## 2022-06-23 DIAGNOSIS — E538 Deficiency of other specified B group vitamins: Secondary | ICD-10-CM | POA: Insufficient documentation

## 2022-07-22 ENCOUNTER — Encounter: Payer: Self-pay | Admitting: *Deleted

## 2022-07-22 ENCOUNTER — Encounter: Payer: Self-pay | Admitting: Cardiology

## 2022-08-02 ENCOUNTER — Ambulatory Visit: Payer: Medicaid Other | Attending: Cardiology | Admitting: Cardiology

## 2022-08-05 ENCOUNTER — Encounter: Payer: Self-pay | Admitting: Cardiology

## 2022-08-30 ENCOUNTER — Ambulatory Visit (INDEPENDENT_AMBULATORY_CARE_PROVIDER_SITE_OTHER): Payer: Medicaid Other

## 2022-08-30 ENCOUNTER — Encounter: Payer: Self-pay | Admitting: Orthopedic Surgery

## 2022-08-30 ENCOUNTER — Ambulatory Visit (INDEPENDENT_AMBULATORY_CARE_PROVIDER_SITE_OTHER): Payer: Medicaid Other | Admitting: Orthopedic Surgery

## 2022-08-30 DIAGNOSIS — M25511 Pain in right shoulder: Secondary | ICD-10-CM

## 2022-08-30 MED ORDER — METHYLPREDNISOLONE ACETATE 40 MG/ML IJ SUSP
40.0000 mg | INTRAMUSCULAR | Status: AC | PRN
  Administered 2022-08-30: 40 mg via INTRA_ARTICULAR

## 2022-08-30 MED ORDER — LIDOCAINE HCL 1 % IJ SOLN
5.0000 mL | INTRAMUSCULAR | Status: AC | PRN
  Administered 2022-08-30: 5 mL
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
## 2022-11-01 ENCOUNTER — Other Ambulatory Visit: Payer: Self-pay | Admitting: Internal Medicine

## 2023-01-05 ENCOUNTER — Ambulatory Visit: Payer: Medicaid Other | Admitting: Internal Medicine

## 2023-01-05 ENCOUNTER — Encounter: Payer: Self-pay | Admitting: Internal Medicine

## 2023-01-05 VITALS — BP 118/68 | HR 71 | Ht 77.0 in | Wt 326.0 lb

## 2023-01-05 DIAGNOSIS — Z860100 Personal history of colon polyps, unspecified: Secondary | ICD-10-CM

## 2023-01-05 DIAGNOSIS — A048 Other specified bacterial intestinal infections: Secondary | ICD-10-CM

## 2023-01-05 NOTE — Patient Instructions (Addendum)
Your provider has ordered "Diatherix" stool testing for you. You have received a kit from our office today containing all necessary supplies to complete this test. Please carefully read the stool collection instructions provided in the kit before opening the accompanying materials. In addition, be sure to place the label from the top left corner of the laboratory request sheet onto the "puritan opti-swab" tube that is supplied in the kit. This label should include your full name and date of birth. After completing the test, you should secure the purtian tube into the specimen biohazard bag. The laboratory request information sheet (including date and time of specimen collection) should be placed into the outside pocket of the specimen biohazard bag and returned to the [facility] lab with 2 days of collection.    Follow up as needed  If your blood pressure at your visit was 140/90 or greater, please contact your primary care physician to follow up on this.   If you are age 20 or older, your body mass index should be between 23-30. Your Body mass index is 38.66 kg/m. If this is out of the aforementioned range listed, please consider follow up with your Primary Care Provider.  If you are age 46 or younger, your body mass index should be between 19-25. Your Body mass index is 38.66 kg/m. If this is out of the aformentioned range listed, please consider follow up with your Primary Care Provider.    The [facility] providers would like to encourage you to use [facility] to communicate with providers for non-urgent requests or questions.  Due to long hold times on the telephone, sending your provider a message by [facility] may be a faster and more efficient way to get a response.  Please allow 48 business hours for a response.  Please remember that this is for non-urgent requests.   Due to recent changes in healthcare laws, you may see the results of your imaging and laboratory studies on MyChart before your  provider has had a chance to review them.  We understand that in some cases there may be results that are confusing or concerning to you. Not all laboratory results come back in the same time frame and the provider may be waiting for multiple results in order to interpret others.  Please give [place] 48 hours in order for your provider to thoroughly review all the results before contacting the office for clarification of your results.    Thank you for entrusting me with your care and for choosing [facility]  Dr. Eulah Pont

## 2023-01-05 NOTE — Progress Notes (Signed)
Chief Complaint: H pylori gastritis  HPI : 57 year old male with history of DM, GERD, CKD, CVA, CAD s/p PCI, OSA, PTSD, prior DVT presents for follow up of H pylori gastritis  Interval History: He is much improved since I last saw him for his EGD/colonoscopy procedure. He completed his H pylori therapy. His reflux is much improved. He has breakthrough reflux 1-2 times per month. He is not on any PPI medication anymore. His dysphagia has now resolved. Denies any abdominal pain. He used to be constipated but this has improved. He is now having one BM per day. He has increased his hydration. He does not have to use any laxative medications.   Wt Readings from Last 3 Encounters:  01/05/23 (!) 326 lb (147.9 kg)  07/04/22 (!) 448 lb (203.2 kg)  04/29/22 (!) 436 lb (197.8 kg)   Current Outpatient Medications  Medication Sig Dispense Refill   amLODipine (NORVASC) 10 MG tablet Take 10 mg by mouth at bedtime.     aspirin EC 81 MG tablet Take 1 tablet (81 mg total) by mouth daily. 30 tablet 0   atorvastatin (LIPITOR) 40 MG tablet Take 40 mg by mouth daily.     citalopram (CELEXA) 40 MG tablet Take 1 tablet (40 mg total) by mouth daily. 90 tablet 0   cyclobenzaprine (FLEXERIL) 5 MG tablet Take 1 tablet (5 mg total) by mouth 3 (three) times daily as needed for muscle spasms. 45 tablet 0   esomeprazole (NEXIUM) 40 MG capsule Take 40 mg by mouth daily at 12 noon.     furosemide (LASIX) 40 MG tablet Take 40 mg by mouth daily.     gabapentin (NEURONTIN) 300 MG capsule Take 600 mg by mouth 2 (two) times daily.     gemfibrozil (LOPID) 600 MG tablet Take 1 tablet (600 mg total) by mouth 2 (two) times daily before a meal.     Glucagon 3 MG/DOSE POWD Place 1 spray into the nose as needed (low blood sugar).     hydrALAZINE (APRESOLINE) 25 MG tablet Take 25 mg by mouth 2 (two) times daily. With the hydralazine 50 mg tablets     hydrALAZINE (APRESOLINE) 50 MG tablet Take 50 mg by mouth 2 (two) times daily.      insulin regular human CONCENTRATED (HUMULIN R U-500 KWIKPEN) 500 UNIT/ML kwikpen Inject 140-150 Units into the skin See admin instructions. 150 units with breakfast and 140 units with supper     metoprolol tartrate (LOPRESSOR) 100 MG tablet Take 100 mg by mouth 2 (two) times daily.     metroNIDAZOLE (FLAGYL) 250 MG tablet Take 250 mg by mouth 4 (four) times daily.     MOUNJARO 12.5 MG/0.5ML Pen Inject into the skin.     MOUNJARO 7.5 MG/0.5ML Pen Inject 7.5 mg into the skin once a week.     nitroGLYCERIN (NITROSTAT) 0.4 MG SL tablet Place 0.4 mg under the tongue every 5 (five) minutes as needed for chest pain.     omeprazole (PRILOSEC) 40 MG capsule Take 40 mg by mouth daily.     tadalafil (CIALIS) 10 MG tablet Take 10 mg by mouth daily as needed for erectile dysfunction.     traZODone (DESYREL) 50 MG tablet Take 0.5-1 tablets (25-50 mg total) by mouth at bedtime as needed for sleep. 15 tablet 0   Vitamin D, Ergocalciferol, (DRISDOL) 1.25 MG (50000 UNIT) CAPS capsule Take 50,000 Units by mouth once a week.     No current facility-administered  medications for this visit.   Physical Exam: BP 118/68   Pulse 71   Ht 6\' 5"  (1.956 m)   Wt (!) 326 lb (147.9 kg)   BMI 38.66 kg/m  Constitutional: Pleasant,well-developed, male in no acute distress. HEENT: Normocephalic and atraumatic. Conjunctivae are normal. No scleral icterus. Cardiovascular: Normal rate, regular rhythm.  Pulmonary/chest: Effort normal and breath sounds normal. No wheezing, rales or rhonchi. Abdominal: Soft, nondistended, nontender. Bowel sounds active throughout. There are no masses palpable. No hepatomegaly. Extremities: Left AKA Neurological: Alert and oriented to person place and time. Skin: Skin is warm and dry. No rashes noted. Psychiatric: Normal mood and affect. Behavior is normal.  Labs 11/2019: CBC with low Hb of 12.7.  Labs 12/2021: CMP with elevated BUN of 31 and elevated Cr of 2.74.  Labs 09/2022: CBC with low Hb  of 11.2, elevated WBC of 13.1. Vit D low at 12. BMP with elevated Cr of 2.98 and elevated glucose of 137.  TTE 09/13/18: Study Conclusions  - Left ventricle: The cavity size was normal. There was moderate    concentric hypertrophy. Systolic function was normal. The    estimated ejection fraction was in the range of 50% to 55%. Wall    motion was normal; there were no regional wall motion    abnormalities. The study is indeterminate for the evaluation of    LV diastolic function.  - Aortic valve: Trileaflet; mildly thickened, mildly calcified    leaflets. There was no regurgitation.  - Mitral valve: Calcified annulus. There was trivial regurgitation.  - Right ventricle: Systolic function was normal.  - Atrial septum: No defect or patent foramen ovale was identified  EGD 04/14/22:  Path: A. DUODENUM, BIOPSY:  - Duodenal mucosa, no significant abnormality.  No intraepithelial  lymphocytes, villous atrophy, or malignancy.  B. STOMACH, ANTRUM, BODY, BIOPSY:  - Focal chronic gastritis.  No intestinal metaplasia, dysplasia or  malignancy.  Warthin-Starry stain positive for rare organisms consistent  with Helicobacter pylori   Colonoscopy 04/14/22:  Path: C. COLON, CECUM, TRANSVERSE, POLYPECTOMY:  - Tubular adenoma, multiple fragments.  No high-grade dysplasia or  malignancy.  - Prominent lymphoid nodules.   ASSESSMENT AND PLAN: H pylori gastritis GERD Anemia History of colon polyps Overall patient is doing very well. His prior GI symptoms including rectal bleeding, dysphagia, abdominal pain, and constipation have all resolved. He completed a course of bismuth quadruple therapy for H pylori so will test him for eradication. He does have occasional breakthrough reflux but this is rare. He is not requiring any PPI medications at this time.  - [facility] H pylori stool test - Next colonoscopy for polyp surveillance in 03/2025 - RTC PRN  Eulah Pont, MD  I spent 32 minutes of time,  including in depth chart review, independent review of results as outlined above, communicating results with the patient directly, face-to-face time with the patient, coordinating care, ordering studies and medications as appropriate, and documentation.

## 2024-05-15 ENCOUNTER — Encounter: Admitting: Podiatry

## 2024-05-16 NOTE — Progress Notes (Signed)
 Patient did not show for his scheduled appointment on 05/15/2024
